# Patient Record
Sex: Male | Born: 1935 | Race: White | Hispanic: No | Marital: Married | Smoking: Never smoker
Health system: Southern US, Community
[De-identification: ages and names within clinical notes are randomized; demographics above are authoritative.]

## PROBLEM LIST (undated history)

## (undated) DIAGNOSIS — I2510 Atherosclerotic heart disease of native coronary artery without angina pectoris: Secondary | ICD-10-CM

## (undated) DIAGNOSIS — D649 Anemia, unspecified: Secondary | ICD-10-CM

## (undated) DIAGNOSIS — M810 Age-related osteoporosis without current pathological fracture: Secondary | ICD-10-CM

## (undated) DIAGNOSIS — Z5189 Encounter for other specified aftercare: Secondary | ICD-10-CM

## (undated) DIAGNOSIS — Z8719 Personal history of other diseases of the digestive system: Secondary | ICD-10-CM

## (undated) DIAGNOSIS — F32A Depression, unspecified: Secondary | ICD-10-CM

## (undated) DIAGNOSIS — M1990 Unspecified osteoarthritis, unspecified site: Secondary | ICD-10-CM

## (undated) DIAGNOSIS — N200 Calculus of kidney: Secondary | ICD-10-CM

## (undated) DIAGNOSIS — R490 Dysphonia: Principal | ICD-10-CM

## (undated) DIAGNOSIS — K439 Ventral hernia without obstruction or gangrene: Secondary | ICD-10-CM

## (undated) DIAGNOSIS — R0602 Shortness of breath: Secondary | ICD-10-CM

## (undated) DIAGNOSIS — M869 Osteomyelitis, unspecified: Secondary | ICD-10-CM

## (undated) DIAGNOSIS — G709 Myoneural disorder, unspecified: Secondary | ICD-10-CM

## (undated) DIAGNOSIS — F05 Delirium due to known physiological condition: Secondary | ICD-10-CM

## (undated) DIAGNOSIS — G20A1 Parkinson's disease without dyskinesia, without mention of fluctuations: Secondary | ICD-10-CM

## (undated) DIAGNOSIS — I4891 Unspecified atrial fibrillation: Secondary | ICD-10-CM

## (undated) DIAGNOSIS — R7611 Nonspecific reaction to tuberculin skin test without active tuberculosis: Secondary | ICD-10-CM

## (undated) DIAGNOSIS — E785 Hyperlipidemia, unspecified: Secondary | ICD-10-CM

## (undated) DIAGNOSIS — R251 Tremor, unspecified: Secondary | ICD-10-CM

## (undated) DIAGNOSIS — F329 Major depressive disorder, single episode, unspecified: Secondary | ICD-10-CM

## (undated) DIAGNOSIS — I872 Venous insufficiency (chronic) (peripheral): Secondary | ICD-10-CM

## (undated) DIAGNOSIS — N183 Chronic kidney disease, stage 3 unspecified: Secondary | ICD-10-CM

## (undated) DIAGNOSIS — K648 Other hemorrhoids: Secondary | ICD-10-CM

## (undated) DIAGNOSIS — I10 Essential (primary) hypertension: Secondary | ICD-10-CM

## (undated) DIAGNOSIS — G20 Parkinson's disease: Secondary | ICD-10-CM

## (undated) DIAGNOSIS — K922 Gastrointestinal hemorrhage, unspecified: Secondary | ICD-10-CM

## (undated) DIAGNOSIS — K219 Gastro-esophageal reflux disease without esophagitis: Secondary | ICD-10-CM

## (undated) DIAGNOSIS — K635 Polyp of colon: Secondary | ICD-10-CM

## (undated) DIAGNOSIS — T8579XA Infection and inflammatory reaction due to other internal prosthetic devices, implants and grafts, initial encounter: Secondary | ICD-10-CM

## (undated) DIAGNOSIS — A159 Respiratory tuberculosis unspecified: Secondary | ICD-10-CM

## (undated) HISTORY — DX: Atherosclerotic heart disease of native coronary artery without angina pectoris: I25.10

## (undated) HISTORY — DX: Other hemorrhoids: K64.8

## (undated) HISTORY — DX: Hyperlipidemia, unspecified: E78.5

## (undated) HISTORY — DX: Essential (primary) hypertension: I10

## (undated) HISTORY — DX: Unspecified osteoarthritis, unspecified site: M19.90

## (undated) HISTORY — PX: EYE MUSCLE SURGERY: SHX370

## (undated) HISTORY — DX: Dysphonia: R49.0

## (undated) HISTORY — DX: Tremor, unspecified: R25.1

## (undated) HISTORY — DX: Polyp of colon: K63.5

## (undated) HISTORY — DX: Parkinson's disease: G20

## (undated) HISTORY — DX: Encounter for other specified aftercare: Z51.89

## (undated) HISTORY — DX: Venous insufficiency (chronic) (peripheral): I87.2

## (undated) HISTORY — PX: ANKLE SURGERY: SHX546

## (undated) HISTORY — PX: SPINE SURGERY: SHX786

## (undated) HISTORY — PX: NOSE SURGERY: SHX723

## (undated) HISTORY — DX: Osteomyelitis, unspecified: M86.9

## (undated) HISTORY — DX: Personal history of other diseases of the digestive system: Z87.19

## (undated) HISTORY — PX: APPENDECTOMY: SHX54

## (undated) HISTORY — DX: Ventral hernia without obstruction or gangrene: K43.9

## (undated) HISTORY — DX: Nonspecific reaction to tuberculin skin test without active tuberculosis: R76.11

## (undated) HISTORY — DX: Chronic kidney disease, stage 3 unspecified: N18.30

## (undated) HISTORY — DX: Major depressive disorder, single episode, unspecified: F32.9

## (undated) HISTORY — PX: CARPAL TUNNEL RELEASE: SHX101

## (undated) HISTORY — PX: FOOT AMPUTATION THROUGH METATARSAL: SHX644

## (undated) HISTORY — DX: Unspecified atrial fibrillation: I48.91

## (undated) HISTORY — DX: Parkinson's disease without dyskinesia, without mention of fluctuations: G20.A1

## (undated) HISTORY — PX: TOE AMPUTATION: SHX809

## (undated) HISTORY — PX: CORONARY ANGIOPLASTY WITH STENT PLACEMENT: SHX49

## (undated) HISTORY — PX: TONSILLECTOMY: SUR1361

## (undated) HISTORY — DX: Chronic kidney disease, stage 3 (moderate): N18.3

## (undated) HISTORY — DX: Gastrointestinal hemorrhage, unspecified: K92.2

## (undated) HISTORY — DX: Age-related osteoporosis without current pathological fracture: M81.0

## (undated) HISTORY — DX: Anemia, unspecified: D64.9

## (undated) HISTORY — PX: CATARACT EXTRACTION: SUR2

## (undated) HISTORY — DX: Depression, unspecified: F32.A

---

## 2000-03-10 HISTORY — PX: REPLACEMENT TOTAL KNEE: SUR1224

## 2000-03-10 HISTORY — PX: DEEP BRAIN STIMULATOR PLACEMENT: SHX608

## 2002-03-10 DIAGNOSIS — K635 Polyp of colon: Secondary | ICD-10-CM

## 2002-03-10 HISTORY — DX: Polyp of colon: K63.5

## 2002-03-22 LAB — HM COLONOSCOPY: HM Colonoscopy: NORMAL

## 2002-05-11 ENCOUNTER — Encounter: Payer: Self-pay | Admitting: Internal Medicine

## 2002-05-11 ENCOUNTER — Ambulatory Visit (HOSPITAL_COMMUNITY): Admission: RE | Admit: 2002-05-11 | Discharge: 2002-05-11 | Payer: Self-pay | Admitting: Internal Medicine

## 2002-06-09 ENCOUNTER — Encounter: Payer: Self-pay | Admitting: Internal Medicine

## 2002-09-15 ENCOUNTER — Encounter: Payer: Self-pay | Admitting: Physical Medicine and Rehabilitation

## 2002-09-15 ENCOUNTER — Encounter
Admission: RE | Admit: 2002-09-15 | Discharge: 2002-09-15 | Payer: Self-pay | Admitting: Physical Medicine and Rehabilitation

## 2003-02-16 ENCOUNTER — Encounter: Admission: RE | Admit: 2003-02-16 | Discharge: 2003-02-16 | Payer: Self-pay | Admitting: Orthopedic Surgery

## 2003-08-10 ENCOUNTER — Inpatient Hospital Stay (HOSPITAL_COMMUNITY): Admission: RE | Admit: 2003-08-10 | Discharge: 2003-08-15 | Payer: Self-pay | Admitting: Specialist

## 2003-11-30 ENCOUNTER — Ambulatory Visit (HOSPITAL_COMMUNITY): Admission: RE | Admit: 2003-11-30 | Discharge: 2003-12-02 | Payer: Self-pay | Admitting: Cardiology

## 2003-12-25 ENCOUNTER — Encounter (HOSPITAL_COMMUNITY): Admission: RE | Admit: 2003-12-25 | Discharge: 2004-03-24 | Payer: Self-pay | Admitting: Cardiology

## 2004-03-10 HISTORY — PX: BACK SURGERY: SHX140

## 2004-03-20 ENCOUNTER — Ambulatory Visit: Payer: Self-pay | Admitting: Cardiology

## 2004-04-02 ENCOUNTER — Ambulatory Visit: Payer: Self-pay | Admitting: Cardiology

## 2004-04-02 ENCOUNTER — Ambulatory Visit: Payer: Self-pay

## 2004-05-23 ENCOUNTER — Ambulatory Visit: Payer: Self-pay | Admitting: Cardiology

## 2004-06-11 ENCOUNTER — Ambulatory Visit: Payer: Self-pay | Admitting: Cardiology

## 2004-06-12 ENCOUNTER — Encounter: Payer: Self-pay | Admitting: Cardiology

## 2004-06-12 ENCOUNTER — Inpatient Hospital Stay (HOSPITAL_COMMUNITY): Admission: EM | Admit: 2004-06-12 | Discharge: 2004-06-14 | Payer: Self-pay | Admitting: Emergency Medicine

## 2004-06-13 ENCOUNTER — Ambulatory Visit: Payer: Self-pay | Admitting: Gastroenterology

## 2004-06-14 ENCOUNTER — Encounter: Payer: Self-pay | Admitting: Gastroenterology

## 2004-08-07 ENCOUNTER — Ambulatory Visit: Payer: Self-pay | Admitting: Cardiology

## 2004-09-30 ENCOUNTER — Ambulatory Visit: Payer: Self-pay | Admitting: Cardiology

## 2004-11-19 ENCOUNTER — Ambulatory Visit: Payer: Self-pay | Admitting: Cardiology

## 2004-11-21 ENCOUNTER — Encounter: Admission: RE | Admit: 2004-11-21 | Discharge: 2004-11-21 | Payer: Self-pay | Admitting: Specialist

## 2005-01-03 ENCOUNTER — Ambulatory Visit: Payer: Self-pay | Admitting: Physical Medicine & Rehabilitation

## 2005-01-03 ENCOUNTER — Inpatient Hospital Stay (HOSPITAL_COMMUNITY): Admission: RE | Admit: 2005-01-03 | Discharge: 2005-01-13 | Payer: Self-pay | Admitting: Specialist

## 2005-01-06 ENCOUNTER — Encounter: Payer: Self-pay | Admitting: Cardiology

## 2005-01-07 ENCOUNTER — Ambulatory Visit: Payer: Self-pay | Admitting: Cardiology

## 2005-01-09 ENCOUNTER — Encounter (INDEPENDENT_AMBULATORY_CARE_PROVIDER_SITE_OTHER): Payer: Self-pay | Admitting: Specialist

## 2005-01-13 ENCOUNTER — Ambulatory Visit: Payer: Self-pay | Admitting: Physical Medicine & Rehabilitation

## 2005-01-13 ENCOUNTER — Inpatient Hospital Stay (HOSPITAL_COMMUNITY)
Admission: RE | Admit: 2005-01-13 | Discharge: 2005-01-25 | Payer: Self-pay | Admitting: Physical Medicine & Rehabilitation

## 2005-02-05 ENCOUNTER — Ambulatory Visit: Payer: Self-pay | Admitting: Cardiology

## 2005-02-20 ENCOUNTER — Ambulatory Visit: Payer: Self-pay

## 2005-03-19 ENCOUNTER — Ambulatory Visit: Payer: Self-pay | Admitting: Cardiology

## 2005-03-26 ENCOUNTER — Ambulatory Visit: Payer: Self-pay | Admitting: Cardiology

## 2005-06-03 ENCOUNTER — Ambulatory Visit: Payer: Self-pay | Admitting: Cardiology

## 2005-06-05 ENCOUNTER — Ambulatory Visit: Payer: Self-pay | Admitting: Cardiology

## 2005-06-10 ENCOUNTER — Ambulatory Visit: Payer: Self-pay | Admitting: Cardiology

## 2005-07-25 ENCOUNTER — Inpatient Hospital Stay (HOSPITAL_COMMUNITY): Admission: RE | Admit: 2005-07-25 | Discharge: 2005-07-25 | Payer: Self-pay | Admitting: Neurosurgery

## 2005-08-08 ENCOUNTER — Ambulatory Visit: Payer: Self-pay | Admitting: Cardiology

## 2005-08-11 ENCOUNTER — Ambulatory Visit: Payer: Self-pay

## 2005-09-27 ENCOUNTER — Inpatient Hospital Stay (HOSPITAL_COMMUNITY): Admission: EM | Admit: 2005-09-27 | Discharge: 2005-10-02 | Payer: Self-pay | Admitting: Emergency Medicine

## 2005-09-27 ENCOUNTER — Encounter (INDEPENDENT_AMBULATORY_CARE_PROVIDER_SITE_OTHER): Payer: Self-pay | Admitting: *Deleted

## 2005-09-30 ENCOUNTER — Encounter: Payer: Self-pay | Admitting: Vascular Surgery

## 2005-10-01 ENCOUNTER — Encounter (INDEPENDENT_AMBULATORY_CARE_PROVIDER_SITE_OTHER): Payer: Self-pay | Admitting: *Deleted

## 2005-10-01 ENCOUNTER — Encounter (INDEPENDENT_AMBULATORY_CARE_PROVIDER_SITE_OTHER): Payer: Self-pay | Admitting: Interventional Radiology

## 2005-11-06 ENCOUNTER — Ambulatory Visit: Payer: Self-pay | Admitting: Cardiology

## 2005-11-07 ENCOUNTER — Encounter: Admission: RE | Admit: 2005-11-07 | Discharge: 2005-11-07 | Payer: Self-pay | Admitting: Specialist

## 2005-11-27 ENCOUNTER — Encounter: Admission: RE | Admit: 2005-11-27 | Discharge: 2005-11-27 | Payer: Self-pay | Admitting: Specialist

## 2005-12-15 ENCOUNTER — Emergency Department (HOSPITAL_COMMUNITY): Admission: EM | Admit: 2005-12-15 | Discharge: 2005-12-15 | Payer: Self-pay | Admitting: Emergency Medicine

## 2006-02-10 ENCOUNTER — Ambulatory Visit: Payer: Self-pay | Admitting: Cardiology

## 2006-02-19 ENCOUNTER — Ambulatory Visit: Payer: Self-pay | Admitting: Cardiology

## 2006-12-03 ENCOUNTER — Ambulatory Visit (HOSPITAL_COMMUNITY): Admission: RE | Admit: 2006-12-03 | Discharge: 2006-12-03 | Payer: Self-pay | Admitting: Specialist

## 2007-01-13 ENCOUNTER — Ambulatory Visit: Payer: Self-pay | Admitting: Cardiology

## 2007-01-15 ENCOUNTER — Ambulatory Visit: Payer: Self-pay | Admitting: Cardiology

## 2007-01-15 LAB — CONVERTED CEMR LAB
AST: 27 units/L (ref 0–37)
Albumin: 4 g/dL (ref 3.5–5.2)
Basophils Absolute: 0.1 10*3/uL (ref 0.0–0.1)
Chloride: 104 meq/L (ref 96–112)
Creatinine, Ser: 1.3 mg/dL (ref 0.4–1.5)
Eosinophils Relative: 5.9 % — ABNORMAL HIGH (ref 0.0–5.0)
Glucose, Bld: 94 mg/dL (ref 70–99)
HCT: 35.9 % — ABNORMAL LOW (ref 39.0–52.0)
LDL Cholesterol: 72 mg/dL (ref 0–99)
Neutrophils Relative %: 63.9 % (ref 43.0–77.0)
RBC: 3.81 M/uL — ABNORMAL LOW (ref 4.22–5.81)
RDW: 12.7 % (ref 11.5–14.6)
Sodium: 138 meq/L (ref 135–145)
Total Bilirubin: 0.9 mg/dL (ref 0.3–1.2)
Total CHOL/HDL Ratio: 4.2
WBC: 6.7 10*3/uL (ref 4.5–10.5)

## 2007-01-19 ENCOUNTER — Ambulatory Visit (HOSPITAL_COMMUNITY): Admission: RE | Admit: 2007-01-19 | Discharge: 2007-01-19 | Payer: Self-pay | Admitting: Specialist

## 2007-01-25 ENCOUNTER — Encounter (INDEPENDENT_AMBULATORY_CARE_PROVIDER_SITE_OTHER): Payer: Self-pay | Admitting: *Deleted

## 2007-01-25 ENCOUNTER — Inpatient Hospital Stay (HOSPITAL_COMMUNITY): Admission: AD | Admit: 2007-01-25 | Discharge: 2007-01-28 | Payer: Self-pay | Admitting: Internal Medicine

## 2007-01-25 ENCOUNTER — Ambulatory Visit: Payer: Self-pay | Admitting: Cardiology

## 2007-01-25 ENCOUNTER — Ambulatory Visit: Payer: Self-pay

## 2007-01-25 ENCOUNTER — Encounter: Payer: Self-pay | Admitting: Cardiology

## 2007-01-25 LAB — CONVERTED CEMR LAB
Basophils Absolute: 0.1 10*3/uL (ref 0.0–0.1)
Eosinophils Absolute: 0.4 10*3/uL (ref 0.0–0.6)
Eosinophils Relative: 5.3 % — ABNORMAL HIGH (ref 0.0–5.0)
MCV: 95.7 fL (ref 78.0–100.0)
Monocytes Relative: 8.8 % (ref 3.0–11.0)
Platelets: 174 10*3/uL (ref 150–400)
RBC: 2.65 M/uL — ABNORMAL LOW (ref 4.22–5.81)
WBC: 8.4 10*3/uL (ref 4.5–10.5)

## 2007-01-27 ENCOUNTER — Encounter: Payer: Self-pay | Admitting: Gastroenterology

## 2007-01-28 ENCOUNTER — Ambulatory Visit: Payer: Self-pay | Admitting: Gastroenterology

## 2007-01-28 ENCOUNTER — Encounter (INDEPENDENT_AMBULATORY_CARE_PROVIDER_SITE_OTHER): Payer: Self-pay | Admitting: *Deleted

## 2007-02-09 ENCOUNTER — Ambulatory Visit: Payer: Self-pay | Admitting: Cardiology

## 2007-02-19 ENCOUNTER — Encounter: Admission: RE | Admit: 2007-02-19 | Discharge: 2007-02-19 | Payer: Self-pay | Admitting: Specialist

## 2007-04-20 ENCOUNTER — Encounter: Admission: RE | Admit: 2007-04-20 | Discharge: 2007-04-20 | Payer: Self-pay | Admitting: Specialist

## 2007-05-24 ENCOUNTER — Ambulatory Visit: Payer: Self-pay | Admitting: Internal Medicine

## 2007-05-24 LAB — CONVERTED CEMR LAB
Basophils Relative: 0.7 % (ref 0.0–1.0)
Eosinophils Relative: 4.6 % (ref 0.0–5.0)
HCT: 36.6 % — ABNORMAL LOW (ref 39.0–52.0)
Hemoglobin: 12 g/dL — ABNORMAL LOW (ref 13.0–17.0)
MCHC: 32.8 g/dL (ref 30.0–36.0)
Monocytes Absolute: 0.9 10*3/uL — ABNORMAL HIGH (ref 0.2–0.7)
Neutrophils Relative %: 70.5 % (ref 43.0–77.0)
RDW: 13.8 % (ref 11.5–14.6)
Saturation Ratios: 8.6 % — ABNORMAL LOW (ref 20.0–50.0)
WBC: 9.7 10*3/uL (ref 4.5–10.5)

## 2007-05-31 ENCOUNTER — Ambulatory Visit: Payer: Self-pay | Admitting: Internal Medicine

## 2007-07-22 ENCOUNTER — Inpatient Hospital Stay (HOSPITAL_COMMUNITY): Admission: AD | Admit: 2007-07-22 | Discharge: 2007-07-23 | Payer: Self-pay | Admitting: Orthopedic Surgery

## 2007-07-22 ENCOUNTER — Encounter (INDEPENDENT_AMBULATORY_CARE_PROVIDER_SITE_OTHER): Payer: Self-pay | Admitting: Orthopedic Surgery

## 2007-09-02 ENCOUNTER — Ambulatory Visit: Payer: Self-pay

## 2007-09-02 ENCOUNTER — Ambulatory Visit: Payer: Self-pay | Admitting: Cardiology

## 2007-09-02 LAB — CONVERTED CEMR LAB
Basophils Relative: 0.2 % (ref 0.0–1.0)
Eosinophils Absolute: 0.4 10*3/uL (ref 0.0–0.7)
Eosinophils Relative: 5.7 % — ABNORMAL HIGH (ref 0.0–5.0)
HCT: 34.9 % — ABNORMAL LOW (ref 39.0–52.0)
Hemoglobin: 12.1 g/dL — ABNORMAL LOW (ref 13.0–17.0)
MCV: 93.5 fL (ref 78.0–100.0)
Monocytes Absolute: 0.7 10*3/uL (ref 0.1–1.0)
Monocytes Relative: 8.7 % (ref 3.0–12.0)
RBC: 3.73 M/uL — ABNORMAL LOW (ref 4.22–5.81)
WBC: 7.7 10*3/uL (ref 4.5–10.5)

## 2007-09-06 ENCOUNTER — Ambulatory Visit: Payer: Self-pay | Admitting: Cardiology

## 2007-09-06 ENCOUNTER — Ambulatory Visit: Payer: Self-pay

## 2007-09-06 LAB — CONVERTED CEMR LAB
CO2: 30 meq/L (ref 19–32)
Calcium: 9.1 mg/dL (ref 8.4–10.5)
Chloride: 102 meq/L (ref 96–112)
Creatinine, Ser: 1.4 mg/dL (ref 0.4–1.5)
Glucose, Bld: 90 mg/dL (ref 70–99)

## 2007-09-18 ENCOUNTER — Inpatient Hospital Stay (HOSPITAL_COMMUNITY): Admission: EM | Admit: 2007-09-18 | Discharge: 2007-09-21 | Payer: Self-pay | Admitting: Emergency Medicine

## 2007-09-19 ENCOUNTER — Encounter (INDEPENDENT_AMBULATORY_CARE_PROVIDER_SITE_OTHER): Payer: Self-pay | Admitting: Orthopaedic Surgery

## 2007-10-05 ENCOUNTER — Encounter: Payer: Self-pay | Admitting: Internal Medicine

## 2007-10-26 ENCOUNTER — Encounter (INDEPENDENT_AMBULATORY_CARE_PROVIDER_SITE_OTHER): Payer: Self-pay | Admitting: Orthopedic Surgery

## 2007-10-26 ENCOUNTER — Ambulatory Visit (HOSPITAL_COMMUNITY): Admission: RE | Admit: 2007-10-26 | Discharge: 2007-10-27 | Payer: Self-pay | Admitting: Orthopedic Surgery

## 2007-11-12 ENCOUNTER — Ambulatory Visit: Payer: Self-pay | Admitting: Cardiology

## 2007-11-12 LAB — CONVERTED CEMR LAB
Basophils Absolute: 0.1 10*3/uL (ref 0.0–0.1)
Basophils Relative: 0.9 % (ref 0.0–3.0)
Eosinophils Absolute: 0.4 10*3/uL (ref 0.0–0.7)
Eosinophils Relative: 5.7 % — ABNORMAL HIGH (ref 0.0–5.0)
HCT: 35.1 % — ABNORMAL LOW (ref 39.0–52.0)
MCHC: 34.2 g/dL (ref 30.0–36.0)
MCV: 91.9 fL (ref 78.0–100.0)
Monocytes Absolute: 0.9 10*3/uL (ref 0.1–1.0)
Neutrophils Relative %: 64.3 % (ref 43.0–77.0)
RBC: 3.82 M/uL — ABNORMAL LOW (ref 4.22–5.81)
WBC: 7.6 10*3/uL (ref 4.5–10.5)

## 2007-12-15 ENCOUNTER — Ambulatory Visit: Payer: Self-pay | Admitting: Cardiology

## 2007-12-15 LAB — CONVERTED CEMR LAB: Saturation Ratios: 22.3 % (ref 20.0–50.0)

## 2008-02-10 ENCOUNTER — Ambulatory Visit: Payer: Self-pay | Admitting: Cardiology

## 2008-02-10 DIAGNOSIS — Z8719 Personal history of other diseases of the digestive system: Secondary | ICD-10-CM

## 2008-02-10 DIAGNOSIS — G20A1 Parkinson's disease without dyskinesia, without mention of fluctuations: Secondary | ICD-10-CM

## 2008-02-10 DIAGNOSIS — E785 Hyperlipidemia, unspecified: Secondary | ICD-10-CM

## 2008-02-10 DIAGNOSIS — G20 Parkinson's disease: Secondary | ICD-10-CM

## 2008-02-10 DIAGNOSIS — I872 Venous insufficiency (chronic) (peripheral): Secondary | ICD-10-CM | POA: Insufficient documentation

## 2008-02-10 DIAGNOSIS — I2510 Atherosclerotic heart disease of native coronary artery without angina pectoris: Secondary | ICD-10-CM | POA: Insufficient documentation

## 2008-02-10 HISTORY — DX: Venous insufficiency (chronic) (peripheral): I87.2

## 2008-02-10 HISTORY — DX: Hyperlipidemia, unspecified: E78.5

## 2008-02-10 HISTORY — DX: Parkinson's disease without dyskinesia, without mention of fluctuations: G20.A1

## 2008-02-10 HISTORY — DX: Parkinson's disease: G20

## 2008-02-10 HISTORY — DX: Personal history of other diseases of the digestive system: Z87.19

## 2008-02-14 ENCOUNTER — Ambulatory Visit: Payer: Self-pay | Admitting: Cardiology

## 2008-02-14 LAB — CONVERTED CEMR LAB
HDL: 38.5 mg/dL — ABNORMAL LOW (ref >39.0)
LDL Cholesterol: 95 mg/dL (ref 0–99)
Total CHOL/HDL Ratio: 4.2
VLDL: 27 mg/dL (ref 0–40)

## 2008-03-31 ENCOUNTER — Encounter: Admission: RE | Admit: 2008-03-31 | Discharge: 2008-03-31 | Payer: Self-pay | Admitting: Family Medicine

## 2008-04-03 ENCOUNTER — Encounter: Payer: Self-pay | Admitting: Internal Medicine

## 2008-07-10 ENCOUNTER — Encounter: Admission: RE | Admit: 2008-07-10 | Discharge: 2008-07-10 | Payer: Self-pay | Admitting: Specialist

## 2008-07-13 ENCOUNTER — Encounter: Admission: RE | Admit: 2008-07-13 | Discharge: 2008-07-13 | Payer: Self-pay | Admitting: Specialist

## 2008-08-01 ENCOUNTER — Encounter: Admission: RE | Admit: 2008-08-01 | Discharge: 2008-08-01 | Payer: Self-pay | Admitting: Specialist

## 2008-10-16 ENCOUNTER — Encounter: Payer: Self-pay | Admitting: Internal Medicine

## 2008-10-20 ENCOUNTER — Encounter: Payer: Self-pay | Admitting: Cardiology

## 2008-12-04 ENCOUNTER — Emergency Department (HOSPITAL_COMMUNITY): Admission: EM | Admit: 2008-12-04 | Discharge: 2008-12-05 | Payer: Self-pay | Admitting: Emergency Medicine

## 2008-12-28 ENCOUNTER — Encounter: Admission: RE | Admit: 2008-12-28 | Discharge: 2009-03-09 | Payer: Self-pay | Admitting: Neurology

## 2009-02-09 ENCOUNTER — Ambulatory Visit: Payer: Self-pay | Admitting: Cardiology

## 2009-02-09 DIAGNOSIS — I10 Essential (primary) hypertension: Secondary | ICD-10-CM

## 2009-02-09 HISTORY — DX: Essential (primary) hypertension: I10

## 2009-02-19 ENCOUNTER — Encounter: Payer: Self-pay | Admitting: Cardiology

## 2009-05-25 ENCOUNTER — Encounter (INDEPENDENT_AMBULATORY_CARE_PROVIDER_SITE_OTHER): Payer: Self-pay | Admitting: *Deleted

## 2009-08-28 ENCOUNTER — Encounter: Admission: RE | Admit: 2009-08-28 | Discharge: 2009-08-28 | Payer: Self-pay | Admitting: Family Medicine

## 2009-09-04 ENCOUNTER — Encounter: Payer: Self-pay | Admitting: Family Medicine

## 2009-09-14 ENCOUNTER — Encounter: Admission: RE | Admit: 2009-09-14 | Discharge: 2009-09-14 | Payer: Self-pay | Admitting: Family Medicine

## 2009-09-20 ENCOUNTER — Encounter (HOSPITAL_COMMUNITY): Admission: RE | Admit: 2009-09-20 | Discharge: 2009-11-29 | Payer: Self-pay | Admitting: Specialist

## 2009-09-21 ENCOUNTER — Encounter: Admission: RE | Admit: 2009-09-21 | Discharge: 2009-09-21 | Payer: Self-pay | Admitting: Specialist

## 2009-09-23 ENCOUNTER — Ambulatory Visit: Payer: Self-pay | Admitting: Vascular Surgery

## 2009-09-23 ENCOUNTER — Inpatient Hospital Stay (HOSPITAL_COMMUNITY): Admission: EM | Admit: 2009-09-23 | Discharge: 2009-09-28 | Payer: Self-pay | Admitting: Emergency Medicine

## 2009-09-23 ENCOUNTER — Encounter (INDEPENDENT_AMBULATORY_CARE_PROVIDER_SITE_OTHER): Payer: Self-pay | Admitting: Internal Medicine

## 2009-09-26 ENCOUNTER — Ambulatory Visit: Payer: Self-pay | Admitting: Physical Medicine & Rehabilitation

## 2009-09-28 ENCOUNTER — Ambulatory Visit: Payer: Self-pay | Admitting: Physical Medicine & Rehabilitation

## 2009-09-28 ENCOUNTER — Inpatient Hospital Stay (HOSPITAL_COMMUNITY)
Admission: RE | Admit: 2009-09-28 | Discharge: 2009-10-15 | Payer: Self-pay | Admitting: Physical Medicine & Rehabilitation

## 2009-10-11 ENCOUNTER — Ambulatory Visit: Payer: Self-pay | Admitting: Internal Medicine

## 2009-11-13 ENCOUNTER — Encounter: Payer: Self-pay | Admitting: Cardiology

## 2009-12-19 ENCOUNTER — Encounter: Payer: Self-pay | Admitting: Cardiology

## 2009-12-24 ENCOUNTER — Encounter: Payer: Self-pay | Admitting: Cardiology

## 2010-02-11 ENCOUNTER — Ambulatory Visit: Payer: Self-pay | Admitting: Cardiology

## 2010-02-11 ENCOUNTER — Encounter: Payer: Self-pay | Admitting: Cardiology

## 2010-02-11 DIAGNOSIS — R0602 Shortness of breath: Secondary | ICD-10-CM | POA: Insufficient documentation

## 2010-02-12 ENCOUNTER — Telehealth: Payer: Self-pay | Admitting: Cardiology

## 2010-02-13 ENCOUNTER — Ambulatory Visit: Payer: Self-pay | Admitting: Cardiology

## 2010-02-13 ENCOUNTER — Ambulatory Visit (HOSPITAL_COMMUNITY)
Admission: RE | Admit: 2010-02-13 | Discharge: 2010-02-13 | Payer: Self-pay | Source: Home / Self Care | Admitting: Cardiology

## 2010-02-13 LAB — CONVERTED CEMR LAB
Calcium: 9.4 mg/dL (ref 8.4–10.5)
GFR calc non Af Amer: 63.4 mL/min (ref >60.00)
Glucose, Bld: 87 mg/dL (ref 70–99)
Potassium: 4.3 meq/L (ref 3.5–5.1)
Sodium: 142 meq/L (ref 135–145)

## 2010-02-18 ENCOUNTER — Telehealth: Payer: Self-pay | Admitting: Cardiology

## 2010-03-08 ENCOUNTER — Ambulatory Visit: Payer: Self-pay | Admitting: Family Medicine

## 2010-03-08 LAB — CONVERTED CEMR LAB
Cholesterol, target level: 200 mg/dL
HDL goal, serum: 40 mg/dL
LDL Goal: 100 mg/dL

## 2010-03-15 ENCOUNTER — Encounter
Admission: RE | Admit: 2010-03-15 | Discharge: 2010-03-15 | Payer: Self-pay | Source: Home / Self Care | Attending: Specialist | Admitting: Specialist

## 2010-03-19 ENCOUNTER — Ambulatory Visit (HOSPITAL_COMMUNITY): Admission: RE | Admit: 2010-03-19 | Payer: Self-pay | Source: Home / Self Care | Admitting: Specialist

## 2010-03-20 ENCOUNTER — Telehealth: Payer: Self-pay | Admitting: Internal Medicine

## 2010-03-27 ENCOUNTER — Other Ambulatory Visit: Payer: Self-pay | Admitting: Dermatology

## 2010-04-09 NOTE — Letter (Signed)
Summary: [facility]   Imported By: Kassie Mends 04/12/2009 08:09:15  _____________________________________________________________________  External Attachment:    Type:   Image     Comment:   External Document

## 2010-04-09 NOTE — Progress Notes (Signed)
Summary: Calling about medication  Medications Added PANTOPRAZOLE SODIUM 40 MG TBEC (PANTOPRAZOLE SODIUM) take one tab by mouth once daily       Phone Note Call from Patient Call back at Home Phone [phone]   Caller: Pernell Dupre Summary of Call: Pt wife calling regarding medication Initial call taken by: Judie Grieve,  February 12, 2010 3:26 PM  Follow-up for Phone Call        I called and spoke with the pt's wife. She states the pt is not on prilosec, but he is taking  pantoprazole 40mg  once daily. I will make this change on his medication list.  Follow-up by: Sherri Rad, RN, BSN,  February 12, 2010 3:37 PM    New/Updated Medications: PANTOPRAZOLE SODIUM 40 MG TBEC (PANTOPRAZOLE SODIUM) take one tab by mouth once daily

## 2010-04-09 NOTE — Assessment & Plan Note (Signed)
Summary: f1y  Medications Added PRILOSEC 20 MG CPDR (OMEPRAZOLE) take one tablet by mouth once daily MIRAPEX 0.5 MG TABS (PRAMIPEXOLE DIHYDROCHLORIDE) take one tablet by mouth six times a day AMANTADINE HCL 100 MG CAPS (AMANTADINE HCL) take one capsule by mouth two times a day METANX 3-35-2 MG TABS (L-METHYLFOLATE-B6-B12) take one tablet by mouth once daily VITAMIN D3 1000 UNIT TABS (CHOLECALCIFEROL) take one tablet by mouth once daily MIRALAX  POWD (POLYETHYLENE GLYCOL 3350) take 17 grams in 8 oz. of liquid once daily LOVAZA 1 GM CAPS (OMEGA-3-ACID ETHYL ESTERS) take one capsule by mouth once daily CITRACAL/VITAMIN D 250-200 MG-UNIT TABS (CALCIUM CITRATE-VITAMIN D) take one tablet by mouth once daily SINEMET 25-250 MG TABS (CARBIDOPA-LEVODOPA) take 1/2 tablet by mouth six times a day MIDODRINE HCL 5 MG TABS (MIDODRINE HCL) take one tablet by mouth two times a day TYLENOL 325 MG TABS (ACETAMINOPHEN) take two tablets by mouth every 4-6 hours as needed ROBAXIN 500 MG TABS (METHOCARBAMOL) take one tablet every 6 hours as needed for muscle spasms SIMVASTATIN 40 MG TABS (SIMVASTATIN) Take one tablet by mouth daily at bedtime      Allergies Added: NKDA  Primary Cheyanne Lamison:  Dr Sandria Manly- Dr Lawanda Cousins  Family Pratice   History of Present Illness: The patient is 77 years old and 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 His wife was with him today. He had a drug of the stent placed to the LAD in 2005 and had an atherectomy procedure same time. He's done well from this since that time with no recent chest pain or palpitations.  His past history is significant for Parkinson's and he has a brain stimulator for this and is followed by Dr. love. He also has had toe amputations for osteomyelitis. He has a history of a GI bleed and had to stop his Plavix for this. He also has hyperlipidemia and degenerative joint disease.  In July of this year he had a lumbar laminectomy for spinal stenosis and was in  rehabilitation and then a skilled nursing facility after that. He has had a great of difficulty with postural hypotension and falls. He has also become deconditioned. He is now home and is doing better. His had no falls since he's been at home.  He has had some symptoms of intermittent shortness of breath not related to exertion. He's had no chest pain or palpitations.    Current Medications (verified): 1)  Prilosec 20 Mg Cpdr (Omeprazole) .... Take One Tablet By Mouth Once Daily 2)  Mirapex 0.5 Mg Tabs (Pramipexole Dihydrochloride) .... Take One Tablet By Mouth Six Times A Day 3)  Amantadine Hcl 100 Mg Caps (Amantadine Hcl) .... Take One Capsule By Mouth Two Times A Day 4)  Paxil 20 Mg Tabs (Paroxetine Hcl) .Marland Kitchen.. 1 Tab Once Daily 5)  Aspirin 81 Mg Tbec (Aspirin) .... Take One Tablet By Mouth Daily 6)  Tramadol Hcl 50 Mg Tabs (Tramadol Hcl) .... One Tab By Mouth Two Times A Day As Needed 7)  Metanx 3-35-2 Mg Tabs (L-Methylfolate-B6-B12) .... Take One Tablet By Mouth Once Daily 8)  Vitamin D3 1000 Unit Tabs (Cholecalciferol) .... Take One Tablet By Mouth Once Daily 9)  Miralax  Powd (Polyethylene Glycol 3350) .... Take 17 Grams in 8 Oz. of Liquid Once Daily 10)  Lovaza 1 Gm Caps (Omega-3-Acid Ethyl Esters) .... Take One Capsule By Mouth Once Daily 11)  Citracal/vitamin D 250-200 Mg-Unit Tabs (Calcium Citrate-Vitamin D) .... Take One Tablet By Mouth Once Daily 12)  Sinemet 25-250 Mg Tabs (Carbidopa-Levodopa) .... Take 1/2 Tablet By Mouth Six Times A Day 13)  Midodrine Hcl 5 Mg Tabs (Midodrine Hcl) .... Take One Tablet By Mouth Two Times A Day 14)  Tylenol 325 Mg Tabs (Acetaminophen) .... Take Two Tablets By Mouth Every 4-6 Hours As Needed 15)  Robaxin 500 Mg Tabs (Methocarbamol) .... Take One Tablet Every 6 Hours As Needed For Muscle Spasms 16)  Simvastatin 40 Mg Tabs (Simvastatin) .... Take One Tablet By Mouth Daily At Bedtime  Allergies (verified): No Known Drug Allergies  Past  History:  Past Medical History: Last updated: 02/10/2008  1. Coronary artery disease status post percutaneous coronary     intervention with a drug-eluting stent to LAD in September 2005. 2. Recurrent gastrointestinal bleeding related to gastric ulcer while     on Plavix. 3. Good LV function. 4. Venous insufficiency in the lower extremities. 5. Status post recent amputation of the right big toe for     osteomyelitis. 6. Hyperlipidemia. 7. 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 8. Parkinson disease with implantable brain stimulator.   Review of Systems       ROS is negative except as outlined in HPI.   Vital Signs:  Patient profile:   77 year old male Height:      68 inches Weight:      211.50 pounds BMI:     32.27 Pulse rate:   65 / minute BP sitting:   140 / 82  (left arm) BP standing:   122 / 80  (left arm) Cuff size:   regular  Vitals Entered By: Stanton Kidney, EMT-P (February 11, 2010 12:53 PM)  Physical Exam  Additional Exam:  Gen. Well-nourished, in no distress   Neck: No JVD, thyroid not enlarged, no carotid bruits Lungs: No tachypnea, clear without rales, rhonchi or wheezes Cardiovascular: Rhythm regular, PMI not displaced,  heart sounds  normal, no murmurs or gallops, trace bilateral peripheral edema, pulses normal in all 4 extremities. Abdomen: BS normal, abdomen soft and non-tender without masses or organomegaly, no hepatosplenomegaly. MS: No deformities, no cyanosis or clubbing   Neuro: tardykinesis and rigidity   Skin:  no lesions    Impression & Recommendations:  Problem # 1:  CAD, NATIVE VESSEL (ICD-414.01)  He had PCI in 2005. He's had no recent chest pain this problem appears stable. His updated medication list for this problem includes:    Aspirin 81 Mg Tbec (Aspirin) .Marland Kitchen... Take one tablet by mouth daily  His updated medication list for this problem includes:    Aspirin 81 Mg Tbec (Aspirin) .Marland Kitchen... Take one tablet by mouth  daily  Orders: EKG w/ Interpretation (93000)  Problem # 2:  SHORTNESS OF BREATH (ICD-786.05) He has had intermittent shortness of breath without chest pain or palpitations. The etiology is not clear. his ECG today shows no acute changes. His exam is normal. He has had long periods of inactivity and I have some concern about the possibility of pulmonary embolism. Will get a d-dimer today. His updated medication list for this problem includes:    Aspirin 81 Mg Tbec (Aspirin) .Marland Kitchen... Take one tablet by mouth daily  Orders: T-D-Dimer Fibrin Derivatives Quantitive [phone])  His updated medication list for this problem includes:    Aspirin 81 Mg Tbec (Aspirin) .Marland Kitchen... Take one tablet by mouth daily  Problem # 3:  PARKINSON'S DISEASE (ICD-332.0) He has a brain stimulator and is followed by Dr. love and Dr. Venetia Maxon. This problem is  his major limitation.  Patient Instructions: 1)  Labwork today: d-dimer (786.05). 2)  Your physician recommends that you continue on your current medications as directed. Please refer to the Current Medication list given to you today. 3)  Your physician wants you to follow-up in:  1 year with Dr. Clifton James.  You will receive a reminder letter in the mail two months in advance. If you don't receive a letter, please call our office to schedule the follow-up appointment.

## 2010-04-09 NOTE — Letter (Signed)
Summary: [facility]  [facility]   Imported By: Marylou Mccoy 01/10/2010 10:54:58  _____________________________________________________________________  External Attachment:    Type:   Image     Comment:   External Document

## 2010-04-09 NOTE — Letter (Signed)
Summary: Colonoscopy Letter  [facility]  [address] [place], [place] [address]   Phone: [phone]  Fax: [phone]      May 25, 2009 MRN: [id number]   [facility] [address] [place], [place]  [address]   Dear Mr. BRODZINSKI,   According to your medical record, it is time for you to schedule a Colonoscopy. The American Cancer Society recommends this procedure as a method to detect early colon cancer. Patients with a family history of colon cancer, or a personal history of colon polyps or inflammatory bowel disease are at increased risk.  This letter has beeen generated based on the recommendations made at the time of your procedure. If you feel that in your particular situation this may no longer apply, please contact our office.  Please call our office at [phone] to schedule this appointment or to update your records at your earliest convenience.  Thank you for cooperating with [place] to provide you with the very best care possible.   Sincerely,  Hedwig Morton. Juanda Chance, M.D.  [facility] [phone]

## 2010-04-09 NOTE — Assessment & Plan Note (Signed)
Summary: O2 sat/hm  Nurse Visit   Vital Signs:  Patient profile:   77 year old male O2 Sat:      98 % on Room air Pulse rate:   67 / minute  Vitals Entered By: Lisabeth Devoid RN (February 13, 2010 11:44 AM)  O2 Flow:  Room air  Allergies: No Known Drug Allergies

## 2010-04-10 ENCOUNTER — Encounter: Payer: Self-pay | Admitting: Specialist

## 2010-04-11 NOTE — Progress Notes (Signed)
Summary: refill  Medications Added PROTONIX 40 MG TBEC (PANTOPRAZOLE SODIUM) Take 1 tablet by mouth two times a day. MUST KEEP OFFICE VISIT FOR FURTHER REFILLS!       Phone Note Call from Patient Call back at Home Phone [phone]   Caller: Patient Call For: Dr Juanda Chance Reason for Call: Talk to Nurse Summary of Call: Patient wants refills for his protonix until his appt day 2-22 Initial call taken by: Tawni Levy,  March 20, 2010 2:31 PM    New/Updated Medications: PROTONIX 40 MG TBEC (PANTOPRAZOLE SODIUM) Take 1 tablet by mouth two times a day. MUST KEEP OFFICE VISIT FOR FURTHER REFILLS! Prescriptions: PROTONIX 40 MG TBEC (PANTOPRAZOLE SODIUM) Take 1 tablet by mouth two times a day. MUST KEEP OFFICE VISIT FOR FURTHER REFILLS!  #60 x 1   Entered by:   Lamona Curl CMA (AAMA)   Authorized by:   Hart Carwin MD   Signed by:   Lamona Curl CMA (AAMA) on 03/20/2010   Method used:   Electronically to        [facility]* (retail)       [address]       [place], [place]  [id number]       Ph: [phone]       Fax: [phone]   RxID:   [id number]

## 2010-04-11 NOTE — Assessment & Plan Note (Signed)
Summary: new pt ov//ccm   Vital Signs:  Patient profile:   77 year old male Weight:      206 pounds Temp:     97.4 degrees F oral BP sitting:   126 / 70  (left arm) Cuff size:   large  Vitals Entered By: Sid Falcon LPN (March 08, 2010 2:20 PM)  History of Present Illness: Here to establish care.  Multiple chronic problems reviewed.  Hx Parkinson's, CAD, hypertension, recurrent falls, venous stasis, hyperlipiemia. Meds reviewed and compliant with all. No recent chest pains. Parkinsons followed closely by neurology with implanted brain stimulator.  [facility] stay and home PT established.  Multiple prior surgeries reviewed.  Hypertension History:      He denies headache, chest pain, palpitations, dyspnea with exertion, visual symptoms, and syncope.        Positive major cardiovascular risk factors include male age 30 years old or older, hyperlipidemia, and hypertension.  Negative major cardiovascular risk factors include no history of diabetes.        Positive history for target organ damage include ASHD (either angina/prior MI/prior CABG).  Further assessment for target organ damage reveals no history of stroke/TIA or peripheral vascular disease.    Lipid Management History:      Positive NCEP/ATP III risk factors include male age 49 years old or older, HDL cholesterol less than 40, hypertension, and ASHD (either angina/prior MI/prior CABG).  Negative NCEP/ATP III risk factors include non-diabetic, no prior stroke/TIA, no peripheral vascular disease, and no history of aortic aneurysm.      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 Married Alcohol use-no 4 children  Past Medical History:  1. Coronary artery disease status post percutaneous coronary     intervention with a drug-eluting stent to  LAD in September 2005. 2. Recurrent gastrointestinal bleeding related to gastric ulcer while     on Plavix. 3. Good LV function. 4. Venous insufficiency in the lower extremities. 5. Status post recent amputation of the right big toe for     osteomyelitis. 6. Hyperlipidemia. 7. 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 8. Parkinson disease with implantable brain stimulator.  9. 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 Consultants:  Dr Lajoyce Corners (ortho), Cardiologist Juanda Chance), neurology (Dr Sandria Manly),                      Dr Venetia Maxon (neurosurgeon).  Past Surgical History: R TKR 2002 Lumbar stenosis 2006 Dr Otelia Sergeant R foot digit amputations. 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 Married Alcohol use-no 4 children  Review of Systems  The patient denies anorexia, fever, weight loss, weight gain, chest pain, syncope, prolonged cough, headaches, hemoptysis, abdominal pain, melena, hematochezia, and severe indigestion/heartburn.    Physical Exam  General:  alert and cooperative. Head:  pt has implanted brain stimulator seen frontal lobes. Ears:  External ear exam shows no significant lesions or deformities.  Otoscopic examination reveals clear canals, tympanic membranes are intact bilaterally without bulging, retraction, inflammation or discharge. Hearing is grossly normal bilaterally. Mouth:  Oral mucosa and oropharynx without lesions or exudates.  Teeth in good repair. Neck:  No deformities, masses, or tenderness noted. Lungs:  Normal respiratory effort, chest expands symmetrically. Lungs are clear to auscultation, no crackles or wheezes. Heart:  normal rate and regular rhythm.  Extremities:  trace leg edema bil.   Neurologic:  alert & oriented X3 and cranial nerves II-XII intact.   Cervical Nodes:  No lymphadenopathy noted Psych:  normally interactive, good eye contact, not anxious appearing, and not  depressed appearing.     Impression & Recommendations:  Problem # 1:  CORONARY ATHEROSCLEROSIS NATIVE CORONARY ARTERY (ICD-414.01)  His updated medication list for this problem includes:    Aspirin 81 Mg Tbec (Aspirin) .Marland Kitchen... Take one tablet by mouth daily  Problem # 2:  HYPERTENSION, BENIGN (ICD-401.1)  Problem # 3:  VENOUS INSUFFICIENCY (ICD-459.81)  Problem # 4:  PARKINSON'S DISEASE (ICD-332.0)  Problem # 5:  HYPERLIPIDEMIA-MIXED (ICD-272.4)  The following medications were removed from the medication list:    Lovaza 1 Gm Caps (Omega-3-acid ethyl esters) .Marland Kitchen... Take one capsule by mouth once daily His updated medication list for this problem includes:    Simvastatin 40 Mg Tabs (Simvastatin) .Marland Kitchen... Take one tablet by mouth daily at bedtime  Complete Medication List: 1)  Mirapex 0.5 Mg Tabs (Pramipexole dihydrochloride) .... Take one tablet by mouth six times a day 2)  Amantadine Hcl 100 Mg Caps (Amantadine hcl) .... Take one capsule by mouth two times a day 3)  Paxil 20 Mg Tabs (Paroxetine hcl) .Marland Kitchen.. 1 tab once daily 4)  Aspirin 81 Mg Tbec (Aspirin) .... Take one tablet by mouth daily 5)  Metanx 3-35-2 Mg Tabs (L-methylfolate-b6-b12) .... Take one tablet by mouth once daily 6)  Miralax Powd (Polyethylene glycol 3350) .... Take 17 grams in 8 oz. of liquid once daily 7)  Citracal/vitamin D 250-200 Mg-unit Tabs (Calcium citrate-vitamin d) .... Take one tablet by mouth once daily 8)  Sinemet 25-250 Mg Tabs (Carbidopa-levodopa) .... Take 1/2 tablet by mouth six times a day 9)  Midodrine Hcl 5 Mg Tabs (Midodrine hcl) .... Take one tablet by mouth two times a day 10)  Simvastatin 40 Mg Tabs (Simvastatin) .... Take one tablet by mouth daily at bedtime 11)  Fentanyl 12 Mcg/hr Pt72 (Fentanyl) 12)  Hydrocodone-acetaminophen 2.5-500 Mg Tabs (Hydrocodone-acetaminophen) .... As needed pain, may take up to 6 per day 13)  Methocarbamol 500 Mg Tabs (Methocarbamol) .... As needed muscle  spasms  Hypertension Assessment/Plan:      The patient's hypertensive risk group is category C: Target organ damage and/or diabetes.  Today's blood pressure is 126/70.    Lipid Assessment/Plan:      Based on NCEP/ATP III, the patient's risk factor category is "history of coronary disease, peripheral vascular disease, cerebrovascular disease, or aortic aneurysm".  The patient's lipid goals are as follows: Total cholesterol goal is 200; LDL cholesterol goal is 100; HDL cholesterol goal is 40; Triglyceride goal is 150.    Patient Instructions: 1)  Please schedule a follow-up appointment in 3 months .  Get AM appt.   Orders Added: 1)  New Patient Level III [99203]    Preventive Care Screening  Colonoscopy:    Date:  03/10/2002    Results:  normal

## 2010-04-11 NOTE — Letter (Signed)
Summary: [facility]  [facility]   Imported By: Marylou Mccoy 03/20/2010 07:57:50  _____________________________________________________________________  External Attachment:    Type:   Image     Comment:   External Document

## 2010-04-11 NOTE — Letter (Signed)
Summary: [facility]   Imported By: Marylou Mccoy 03/20/2010 07:58:32  _____________________________________________________________________  External Attachment:    Type:   Image     Comment:   External Document

## 2010-04-11 NOTE — Letter (Signed)
Summary: [facility] at [facility] 2011  Family Medicine at [facility] 2011   Imported By: Maryln Gottron 03/21/2010 09:43:11  _____________________________________________________________________  External Attachment:    Type:   Image     Comment:   External Document

## 2010-04-11 NOTE — Progress Notes (Signed)
Summary: pt's wife calling for test results    Phone Note Call from Patient   Caller: Spouse [phone] grace Reason for Call: Talk to Nurse, Lab or Test Results Summary of Call: pt's wife calling re test results Initial call taken by: Glynda Jaeger,  February 18, 2010 10:17 AM  Follow-up for Phone Call        [facility]. Sherri Rad, RN, BSN  February 18, 2010 2:21 PM  I spoke with the pt's wife. Follow-up by: Sherri Rad, RN, BSN,  February 18, 2010 4:21 PM

## 2010-04-22 ENCOUNTER — Telehealth: Payer: Self-pay | Admitting: Internal Medicine

## 2010-04-29 DIAGNOSIS — K439 Ventral hernia without obstruction or gangrene: Secondary | ICD-10-CM | POA: Insufficient documentation

## 2010-04-29 DIAGNOSIS — I4891 Unspecified atrial fibrillation: Secondary | ICD-10-CM

## 2010-04-29 DIAGNOSIS — K429 Umbilical hernia without obstruction or gangrene: Secondary | ICD-10-CM | POA: Insufficient documentation

## 2010-04-29 DIAGNOSIS — Z860100 Personal history of colon polyps, unspecified: Secondary | ICD-10-CM | POA: Insufficient documentation

## 2010-04-29 DIAGNOSIS — M1990 Unspecified osteoarthritis, unspecified site: Secondary | ICD-10-CM | POA: Insufficient documentation

## 2010-04-29 DIAGNOSIS — D649 Anemia, unspecified: Secondary | ICD-10-CM | POA: Insufficient documentation

## 2010-04-29 DIAGNOSIS — Z8711 Personal history of peptic ulcer disease: Secondary | ICD-10-CM | POA: Insufficient documentation

## 2010-04-29 HISTORY — DX: Unspecified osteoarthritis, unspecified site: M19.90

## 2010-04-29 HISTORY — DX: Anemia, unspecified: D64.9

## 2010-04-29 HISTORY — DX: Unspecified atrial fibrillation: I48.91

## 2010-04-29 HISTORY — DX: Ventral hernia without obstruction or gangrene: K43.9

## 2010-05-01 ENCOUNTER — Ambulatory Visit (INDEPENDENT_AMBULATORY_CARE_PROVIDER_SITE_OTHER): Payer: Medicare Other | Admitting: Internal Medicine

## 2010-05-01 ENCOUNTER — Encounter: Payer: Self-pay | Admitting: Internal Medicine

## 2010-05-01 DIAGNOSIS — K273 Acute peptic ulcer, site unspecified, without hemorrhage or perforation: Secondary | ICD-10-CM

## 2010-05-01 NOTE — Progress Notes (Signed)
Summary: Schedule Colonoscopy   Phone Note Outgoing Call Call back at Home Phone [phone]   Call placed by: Harlow Mares CMA Duncan Dull),  April 22, 2010 10:39 AM Call placed to: Patient Summary of Call: spoke to pts wife she states that pt has an office visit on 05/01/2010 whic they will keep I also advised her taht pt is over due for his colonoscopy as well and they will discuss that at the office visit.  Initial call taken by: Harlow Mares CMA (AAMA),  April 22, 2010 10:39 AM

## 2010-05-07 NOTE — Discharge Summary (Signed)
Summary: SOB, Tarry Black Stools  NAME:  [name], [name] NO.:  [id number]      MEDICAL RECORD NO.:  [id number]          PATIENT TYPE:  INP      LOCATION:  6740                         FACILITY:  [facility]      PHYSICIAN:  Hillery Aldo, M.D.   DATE OF BIRTH:  Mar 13, 1929      DATE OF ADMISSION:  01/25/2007   DATE OF DISCHARGE:                                 HISTORY & PHYSICAL      PRIMARY CARE PHYSICIAN:  Dr. Andrey Campanile at [facility].      CARDIOLOGIST:  Dr. Juanda Chance.      CHIEF COMPLAINT:  Shortness of breath, tarry black stool.      HISTORY OF PRESENT ILLNESS:  The patient is a 77 year old male with past   medical history of coronary artery disease maintained on Aspirin and   Plavix therapy, as well as a past medical history of acute GI bleed   secondary to gastric ulcer who presented to his cardiologist today for   evaluation of progressive shortness of breath.  During the course of his   visit, he mentioned to his cardiologist that he has been having black   tarry stools for the past 24 hours.  A hemoglobin and hematocrit check   was done and he was found to have approximately 4 gram drop in his   hemoglobin from his normal baseline.  It was recommended that the   patient present for direct admission to the hospital for further   evaluation and workup.  The patient also endorses some transient   lightheadedness yesterday, but no frank syncope.  He is being admitted   for stabilization and GI consultation for presumed recurrent gastric   ulcer with bleed.      PAST MEDICAL HISTORY:   1. Coronary artery disease status post drug eluding stent to the       LAD/atherectomy to the diagonal September, 2005 with some residual       disease medically managed   2. Non-ST elevation MI in 2007 status post back surgery.   3. Status post left open carpal tunnel release November, 2008.   4. Severe Parkinson's disease status post deep brain stimulator.   5.  Chronic lower extremity venous insufficiency with history of       cellulitis.   6. Degenerative disk disease/degenerative joint disease of the spine,       chronic back pain.   7. Hyperlipidemia.   8. Spinal stenosis status post laminectomy in 2005 with fusion of L4-       L5 and L5-S1.   9. History of nephrolithiasis.   10.History of tuberculosis as a child.   11.History of gastric ulcer with bleed in April, 2006.   12.Status post right total knee replacement.   13.Status post cataract extraction 2005.   14.History of anemia.   15.History of diverticulosis.      ALLERGIES:  MORPHINE, SUBLINGUAL NITROGLYCERIN, SEPTRA.      CURRENT MEDICATIONS:   1. Darvocet-N 100 one p.o. q.4h. p.r.n.  2. MiraLax as needed.   3. Paroxetine 20 mg daily.   4. Mirapex 1 mg 1/2 tablet q.3h.   5. Carbidopa 25/250 1/2 tablet q.3h.   6. Plavix 75 mg daily.   7. Simvastatin 40 mg daily.   8. Amantadine 100 mg daily.   9. Aspirin 81 mg daily.      FAMILY HISTORY:  The patient's mother died of a ruptured aortic   abdominal aneurysm.  The patient's father died at 76 from old age.  He   has one brother who has diabetes.  He has two sisters, one was suffers   with Parkinson's disease.      SOCIAL HISTORY:  The patient is married and lives with his wife.  He is   a retired Radio producer.  He is a lifelong nonsmoker.  No alcohol or   drug use.      REVIEW OF SYSTEMS:  The patient has not had any fever or chills.  He   denies any abdominal pain.  No problems with his appetite.  He has had   some decreased energy.  Denies any chest pain.  No cough.  His bowel   movement today was formed.  He has a history of constipation.  There has   been no associated nausea, vomiting or diarrhea.  No dysuria.  No   musculoskeletal complaints except for his usual chronic back pain.      PHYSICAL EXAM:  VITAL SIGNS: Temperature 98.6, blood pressure 152/78,   pulse 63, respirations 18, O2 saturation 100% on room  air.   GENERAL:  Well-developed, well-nourished male in no acute distress.   HEENT: Normocephalic, atraumatic.  The patient does have a deep brain   stimulator visible in his left parietal area.  PERRL.  EOMI.  Oropharynx   clear.   NECK:  Supple, no thyromegaly, no lymphadenopathy, no jugular venous   distention.   CHEST:  Lungs clear to auscultation bilaterally with good air movement.   HEART:  Regular rate, rhythm.  No murmurs, rubs, or gallops.   ABDOMEN:  Soft, nontender, nondistended with normoactive bowel sounds.   RECTAL:  Melanotic stool which is strongly heme positive on guaiac   testing.   EXTREMITIES:  No clubbing, edema, cyanosis.  He does have TED hose on.   NEUROLOGIC:  The patient is alert and oriented x3.  Cranial nerves II-   XII grossly intact.  Nonfocal.   SKIN:  Warm, dry.  No rashes.      Data reviewed.      LABORATORY DATA:  White blood cell count is 8.4, hemoglobin 8.6,   hematocrit 25.3, platelets 174.  Coagulation studies and a basic   metabolic panel are pending at the time of dictation.      ASSESSMENT/PLAN:   1. Acute blood loss anemia secondary to recurrent gastrointestinal       bleed, likely upper:  The patient will be admitted and monitored       closely for hemodynamic stability.  We will start two large-bore       intravenous and put him on normal saline at a rate of 120 mL an       hour.  We will check a hemoglobin and hematocrit q.6h. and given       his coronary disease, transfuse him to maintain a hemoglobin level       of 10 mg/dL.  We will hold his Plavix.  We will start him on  intravenous proton-pump-inhibitor therapy q.12h. intravenous.  We       will obtain a gastrointestinal consultation in the morning for       consideration of an upper endoscopy.   2. Coronary artery disease:  Patient is not having any complaints of       chest pain.  We will monitor him closely for signs of       decompensation.   3. Venous insufficiency:   Continue the patient on TED hose as well PAS       hoses.   4. Hyperlipidemia:  Continue the patient's statin therapy.   5. Prophylaxis:  The patient will be on intravenous Protonix and PAS       hoses for gastrointestinal and deep vein thrombosis prophylaxis.   6. Parkinson's disease:  We will continue the patient's anti-Parkinson       therapy.               Hillery Aldo, M.D.   Electronically Signed            CR/MEDQ  D:  01/25/2007  T:  01/26/2007  Job:  [id number]      cc:   Everardo Beals. Juanda Chance, MD, [facility]   Gloriajean Dell. Andrey Campanile, M.D.

## 2010-05-07 NOTE — Procedures (Signed)
Summary: EGD   EGD  Procedure date:  06/14/2004  Findings:      Location: [facility]   Patient Name: Lance, [name] MRN: [id number] Procedure Procedures: Panendoscopy (EGD) CPT: 43235.    with biopsy(s)/brushing(s). CPT: D1846139.  Personnel: Endoscopist: Venita Lick. Russella Dar, MD, Clementeen Graham.  Exam Location: Exam performed in Endoscopy Suite. Inpatient-ward  Patient Consent: Procedure, Alternatives, Risks and Benefits discussed, consent obtained, from patient.  Indications  Evaluation of: Anemia,  with low iron saturation. Positive fecal occult blood test  History  Current Medications: Patient is not currently taking Coumadin.  Pre-Exam Physical: Performed Jun 14, 2004  Cardio-pulmonary exam, HEENT exam, Abdominal exam, Neurological exam, Mental status exam WNL.  Exam Exam Info: Maximum depth of insertion Duodenum, intended Duodenum. Vocal cords not visualized. Gastric retroflexion performed. ASA Classification: II. Tolerance: excellent.  Sedation Meds: Patient assessed and found to be appropriate for moderate (conscious) sedation. Cetacaine Spray 2 sprays given aerosolized. Versed 3.5 mg. given IV. Fentanyl 25 mcg. given IV.  Monitoring: BP and pulse monitoring done. Oximetry used. Supplemental O2 given  Findings Normal: Proximal Esophagus to Body.  ULCER: in Antrum Maximum size: 5 mm. Not bleeding, clear ulcer base. RUT done, results pending.  ICD9: Ulcer, Gastric, Acute without Hemorrhage: 531.30.  Normal: Pyloric Sphincter to Duodenal 2nd Portion.   Assessment  Diagnoses: 531.30: Ulcer, Gastric, Acute without Hemorrhage.   Events  Unplanned Intervention: No unplanned interventions were required.  Unplanned Events: There were no complications. Plans Medication(s): Await pathology. Continue current medications. PPI: QAM,   Comments: Hold ASA for 2 weeks if possible. He will need to take a PPI long term if ASA is needed long term. Treat for H. pylori if  RUT is positive. Disposition: After procedure patient sent to recovery. After recovery patient sent back to hospital.  Scheduling: Referring provider, to Isidor Holts, MD,  Office Visit, to Dora L. Juanda Chance, MD, prn     This report was created from the original endoscopy report, which was reviewed and signed by the above listed endoscopist.

## 2010-05-07 NOTE — Procedures (Signed)
Summary: EGD   EGD  Procedure date:  01/27/2007  Findings:      Location: [facility]   Patient Name: Lance, [name] MRN: [id number] Procedure Procedures: Panendoscopy (EGD) CPT: 43235.    with biopsy(s)/brushing(s). CPT: D1846139.  Personnel: Endoscopist: Venita Lick. Russella Dar, MD, Clementeen Graham.  Exam Location: Exam performed in Endoscopy Suite. Inpatient-ward  Patient Consent: Procedure, Alternatives, Risks and Benefits discussed, consent obtained, from patient. Consent was obtained by the RN.  Indications Symptoms: Melena.  History  Current Medications: Patient is not currently taking Coumadin.  Pre-Exam Physical: Performed Jan 27, 2007  Cardio-pulmonary exam, HEENT exam WNL. Abdominal exam abnormal. Mental status exam WNL.  Comments: Pt. history reviewed/updated, physical exam performed prior to initiation of sedation?Yes Exam Exam Info: Maximum depth of insertion Duodenum, intended Duodenum. Vocal cords not visualized. Gastric retroflexion performed. Images taken. ASA Classification: II. Tolerance: excellent.  Sedation Meds: Patient assessed and found to be appropriate for moderate (conscious) sedation. Fentanyl 25 mcg. given IV. Versed 2 mg. given IV. Cetacaine Spray 2 sprays given aerosolized.  Monitoring: BP and pulse monitoring done. Oximetry used. Supplemental O2 given  Findings Normal: Proximal Esophagus to Body.  ULCER: in Antrum Not bleeding, clear ulcer base. RUT done, results pending.  ICD9: Ulcer, Gastric, Acute without Hemorrhage: 531.30.  MUCOSAL ABNORMALITY: Antrum to Pyloric Sphincter. Erosions present. Erythematous mucosa. ICD9: Gastritis, Unspecified: 535.50.  Normal: Duodenal Bulb to Duodenal 2nd Portion.   Assessment  Diagnoses: 531.30: Ulcer, Gastric, Acute without Hemorrhage.  535.50: Gastritis, Unspecified.   Events  Unplanned Intervention: No unplanned interventions were required.  Unplanned Events: There were no  complications. Plans Medication(s): Await pathology. PPI: QAM, for indefinitely.   Patient Education: Patient given standard instructions for: Ulcer. Mucosal Abnormality.  Comments: HE NEEDS TO TAKE A PPI QAM FOREVER. HOLD ASA FOR 2 WEEKS IF POSSIBLE. Disposition: After procedure patient sent to recovery. After recovery patient sent home.  Scheduling: EGD, to [employer]. Russella Dar, MD, [facility], F/U GU around Apr 29, 2007.    This report was created from the original endoscopy report, which was reviewed and signed by the above listed endoscopist.    RUT NEGATIVE

## 2010-05-07 NOTE — Procedures (Signed)
Summary: COLON   Colonoscopy  Procedure date:  06/09/2002  Findings:      Location:  [facility].    Procedures Next Due Date:    Colonoscopy: 06/2007 Patient Name: [name], [name] MRN: [id number] Procedure Procedures: Colonoscopy CPT: 57846.    with multiple biopsies. CPT: 9043355007.  Personnel: Endoscopist: Viriginia Amendola L. Juanda Chance, MD.  Referred By: Karlene Einstein, MD.  Exam Location: Exam performed in Outpatient Clinic. Outpatient  Patient Consent: Procedure, Alternatives, Risks and Benefits discussed, consent obtained, from patient. Consent was obtained by the RN.  Indications Symptoms: Abdominal pain / bloating. LARGE ABDOMINAL HERNIA.  History  Pre-Exam Physical: Performed Jun 09, 2002. Cardio-pulmonary exam, Rectal exam, HEENT exam , Abdominal exam, Extremity exam, Neurological exam, Mental status exam WNL.  Exam Exam: Extent of exam reached: Cecum, extent intended: Cecum.  The cecum was identified by appendiceal orifice and IC valve. Colon retroflexion performed. Images taken. ASA Classification: II. Tolerance: good.  Monitoring: Pulse and BP monitoring, Oximetry used. Supplemental O2 given.  Colon Prep Used Golytely for colon prep. Prep results: good.  Sedation Meds: Patient assessed and found to be appropriate for moderate (conscious) sedation. Fentanyl 75 mcg. given IV. Versed 5 mg. given IV.  Findings - MULTIPLE POLYPS: Sigmoid Colon. minimum size 2 mm, maximum size 5 mm. Procedure:  hot biopsy, removed, Polyp retrieved, 7 polyps Polyps sent to pathology. ICD9: Colon Polyps: 211.3.  HEMORRHOIDS: Internal. Size: Grade I. Not bleeding. ICD9: Hemorrhoids, Internal: 455.0.   Assessment Abnormal examination, see findings above.  Diagnoses: 211.3: Colon Polyps.  455.0: Hemorrhoids, Internal.   Comments: multiple diminutive polyps Events  Unplanned Interventions: No intervention was required.  Unplanned Events: There were no  complications. Plans Medication Plan: Await pathology.  Patient Education: Patient given standard instructions for: Yearly hemoccult testing recommended. Patient instructed to get routine colonoscopy every 5 years.  Disposition: After procedure patient sent to recovery. After recovery patient sent home.   This report was created from the original endoscopy report, which was reviewed and signed by the above listed endoscopist.

## 2010-05-07 NOTE — Discharge Summary (Signed)
Summary: GI Bleed  NAME:  [name], [name]                 ACCOUNT NO.:  [id number]      MEDICAL RECORD NO.:  [id number]          PATIENT TYPE:  INP      LOCATION:  6740                         FACILITY:  [facility]      PHYSICIAN:  Hind I Elsaid, MD      DATE OF BIRTH:  Jun 18, 1929      DATE OF ADMISSION:  01/25/2007   DATE OF DISCHARGE:  01/28/2007                                  DISCHARGE SUMMARY      PRIMARY CARE PHYSICIAN:  Dr. Andrey Campanile at [facility]      CARDIOLOGIST:  Dr. Charlies Constable      DISCHARGE DIAGNOSES:   1. Upper gastrointestinal bleeding secondary to gastric ulcer.   2. Acute blood loss anemia secondary to #1.   3. Coronary artery disease status post drug-eluting stent in the left       anterior descending and arthrectomy to the diagonals in 2005.   4. History of non-ST-elevation infarction in 2007 with ejection       fraction of 65% at the last stress Myoview in 2007.   5. Parkinson disease status post brain stimulator.   6. Hyperlipidemia.   7. Peripheral vascular disease.   8. Status post left open carpal tunnel surgery.   9. Chronic lower extremity venous insufficiency.   10.History of degenerative disc disease/degenerative joint disease of       the spine.   11.Status post right total knee replacement.   12.Status post cataract extraction.   13.History of diverticulosis.      DISCHARGE MEDICATIONS:   1. Protonix 40 mg p.o. q.a.m.   2. Paroxetine 20 mg p.o. daily.   3. Mirapex 1 mg 1/2 tab q.3 hours.   4. Carbidopa 25/250 half tab q.3 hours.   5. Simvastatin 40 mg p.o. q.daily.   6. Amantadine 100 mg p.o. daily.      CONSULTATIONS:   1. Gastroenterology consulted for the evaluations of the melena.   2. Dr. Juanda Chance was to follow up the patient during hospitalization.      PROCEDURES:   1. Chest x-ray:  No acute cardiopulmonary disease.   2. EGD which shows ulcer in the antrum, not bleeding, clear ulcer base       with antrum to pyloric sphincter  erosion present.  Biopsy was done       from that site.      HISTORY OF PRESENT ILLNESS:  This is a 77 year old male, very pleasant   gentleman with a history of coronary artery disease on aspirin and   Plavix, history of GI bleeding secondary to gastric ulcer, presented to   his cardiologist, Dr. Charlies Constable, for evaluation of progressive   shortness of breath.  During the course of his visit, he mentioned he   had black tarry stool for the past 24 hours.  At that time, hemoglobin   and hematocrit checked which was found around 8 which is a 4 gm drop in   his hemoglobin from his normal baseline.  At that time,  patient was   recommended to be evaluated at the emergency room.      1. Upper GI bleeding.  Patient was admitted to the hospital and       started on IV fluid and IV Protonix, also patient received total of       three units of packed RBCs during hospitalization.  Aspirin and       Plavix were held secondary to the above problem.  Gastroenterology       consulted, done by Dr. Claudette Head where he recommended EGD; the       report as above shows evidence of ulcer status post biopsy and       recommendations were to continue with Protonix 40 mg p.o. q.a.m.       During hospitalization, hemoglobin remained stable around 11.       Shortness of breath completely resolved.  Further recommendations       by Dr. Russella Dar to hold aspirin.  For that, Dr. Juanda Chance was also asked       to evaluate the patient where he agreed with stopping Plavix and       hold aspirin for two weeks, and need to keep appointment with Dr.       Juanda Chance on December 2.  Patient's symptoms completely resolved and       H&H remained stable.  We felt that the patient is medically stable       to be discharged on Protonix 40 mg p.o. q.a.m.; follow the biopsy       report with Dr. Claudette Head and followup appointment with his       cardiologist, Dr. Juanda Chance, for further evaluation of the aspirin and       the Plavix.     2. Coronary artery disease status post stent and history of non-ST-       elevation MI.  Patient remained stable with no evidence of       shortness of breath or active cardiac event.   3. Parkinson disease.  Patient remained on Sinemet and other       medications.      DISPOSITION:  We feel that the patient is medically stable to be   discharged home and follow with Dr. Claudette Head the result of the   biopsy.  Follow with Dr. Juanda Chance for further evaluation to restart the   aspirin and Plavix.               Hind Bosie Helper, MD   Electronically Signed            HIE/MEDQ  D:  01/28/2007  T:  01/28/2007  Job:  [id number]      cc:   Dr. Timmothy Sours R. Juanda Chance, MD, [facility]

## 2010-05-07 NOTE — Procedures (Signed)
Summary: EGD   EGD  Procedure date:  05/31/2007  Findings:      Location: [facility]   Patient Name: [name], [name] MRN: [id number] Procedure Procedures: Panendoscopy (EGD) CPT: 43235.    with biopsy(s)/brushing(s). CPT: D1846139.  Personnel: Endoscopist: Kiahna Banghart L. Juanda Chance, MD.  Exam Location: Exam performed in Outpatient Clinic. Outpatient  Patient Consent: Procedure, Alternatives, Risks and Benefits discussed, consent obtained, from patient. Consent was obtained by the RN.  Indications  Evaluation of: Anemia,   Surveillance of: Prior gastric ulcer. Last exam: Nov, 2008.  Comments: blood count up to 12.0 gm, stool heme negative, no GI Sx's, Plavix D/C'ed in November 2008, pt had 2 GI bleeds in last 3 years History  Current Medications: Patient is taking a non-steroidal medication. Patient is on an anticoagulant. Patient is not currently taking Coumadin.  Pre-Exam Physical: Performed May 31, 2007  Cardio-pulmonary exam, HEENT exam WNL. Abdominal exam abnormal. Mental status exam WNL.  Comments: Pt. history reviewed/updated, physical exam performed prior to initiation of sedation?yes Exam Exam Info: Maximum depth of insertion Duodenum, intended Duodenum. Vocal cords visualized. Gastric retroflexion performed. Images taken. ASA Classification: III. Tolerance: good.  Sedation Meds: Patient assessed and found to be appropriate for moderate (conscious) sedation. Fentanyl 25 mcg. given IV. Versed 5 mg. given IV. Cetacaine Spray 2 sprays given aerosolized.  Monitoring: BP and pulse monitoring done. Oximetry used. Supplemental O2 given  Findings - Normal: Distal Esophagus.  - Normal: Fundus.  - MUCOSAL ABNORMALITY: Antrum. Erosions present. Erythematous mucosa. Friable mucosa. Subepithelial hemorrhage present. RUT done, results pending. ICD9: Gastritis, Acute: 535.00.  HEALED ULCER: in Antrum Comments: healed antral ulcer.  Normal: Duodenal 2nd Portion.    Assessment Abnormal examination, see findings above.  Diagnoses: 535.00: Gastritis, Acute.   Comments: persistent antral gastritis, high risk for re-bleeding, compete healing of the Gustric ulcer from Nov 2008 Events  Unplanned Intervention: No unplanned interventions were required.  Unplanned Events: There were no complications. Plans Medication(s): Await pathology. PPI: 1 QD, starting May 31, 2007  Iron: 1 QD, starting May 31, 2007   Comments: After speaking with Dr Charlies Constable, we will not resume the Plavix due to the continued risk of rebleeding and hx of 2 prior bleeds, repeat CBC, Iron studies in 2 months Disposition: After procedure patient sent to recovery. After recovery patient sent home.    RUT NEGATIVE

## 2010-05-07 NOTE — Assessment & Plan Note (Signed)
Summary: RX RENEWAL   SCH'D W/WIFE//MEDICARE//A[employer] INS//COPAY AND CX F...   Vital Signs:  Patient profile:   77 year old male Height:      68 inches Weight:      212 pounds BMI:     32.35 Pulse rate:   68 / minute Pulse rhythm:   regular BP sitting:   152 / 82  (left arm)  Vitals Entered By: [name] NCMA (May 01, 2010 9:50 AM)  History of Present Illness Visit Type: Follow-up Visit Primary GI MD: Lance Sar MD Primary Provider: Evelena Peat, MD Chief Complaint: Refill of meds-follow-up History of Present Illness:   This is a 77 year old white male with Parkinson's disease and coronary artery disease who is being seen today for refills of his Protonix. He has a history of a bleeding gastric ulcer in April 2004 and recurrent ulcer on an upper endoscopy in November 2008. He was H. pylori negative. His GI bleeding occurred while on Plavix and aspirin which have since been discontinued. He has been semi-ambulatory due to his Parkinson disease and has had some dysphagia to liquids, likely due to esophageal dysmotility. There is a history of hyperplastic of polyp on colonoscopy in April 2004. He denies any GI symptoms of abdominal pain or tarry stools. He is due for a blood count by his primary care physician [name].   GI Review of Systems      Denies abdominal pain, acid reflux, belching, bloating, chest pain, dysphagia with liquids, dysphagia with solids, heartburn, loss of appetite, nausea, vomiting, vomiting blood, weight loss, and  weight gain.        Denies anal fissure, black tarry stools, change in bowel habit, constipation, diarrhea, diverticulosis, fecal incontinence, heme positive stool, hemorrhoids, irritable bowel syndrome, jaundice, light color stool, liver problems, rectal bleeding, and  rectal pain.  Preventive Screening-Counseling & Management      Drug Use:  no.    Current Medications (verified): 1)  Mirapex 0.5 Mg Tabs (Pramipexole  Dihydrochloride) .... Take One Tablet By Mouth Six Times A Day 2)  Amantadine Hcl 100 Mg Caps (Amantadine Hcl) .... Take One Capsule By Mouth Two Times A Day 3)  Paxil 20 Mg Tabs (Paroxetine Hcl) .Marland Kitchen.. 1 Tab Once Daily 4)  Aspirin 81 Mg Tbec (Aspirin) .... Take One Tablet By Mouth Daily 5)  Metanx 3-35-2 Mg Tabs (L-Methylfolate-B6-B12) .... Take One Tablet By Mouth Once Daily 6)  Miralax  Powd (Polyethylene Glycol 3350) .... Take 17 Grams in 8 Oz. of Liquid Once Daily 7)  Citracal/vitamin D 250-200 Mg-Unit Tabs (Calcium Citrate-Vitamin D) .... Take One Tablet By Mouth Once Daily 8)  Sinemet 25-250 Mg Tabs (Carbidopa-Levodopa) .... Take 1/2 Tablet By Mouth Six Times A Day 9)  Midodrine Hcl 5 Mg Tabs (Midodrine Hcl) .... Take One Tablet By Mouth Two Times A Day 10)  Simvastatin 40 Mg Tabs (Simvastatin) .... Take One Tablet By Mouth Daily At Bedtime 11)  Hydrocodone-Acetaminophen 2.5-500 Mg Tabs (Hydrocodone-Acetaminophen) .... As Needed Pain, May Take Up To 6 Per Day 12)  Methocarbamol 500 Mg Tabs (Methocarbamol) .... As Needed Muscle Spasms 13)  Protonix 40 Mg Tbec (Pantoprazole Sodium) .... Take 1 Tablet By Mouth Two Times A Day.  (Has Been Only Taking 1 By Mouth Once Daily )  Allergies (verified): 1)  ! Morphine  Past History:  Past Medical History: Reviewed history from 03/08/2010 and no changes required.  1. Coronary artery disease status post percutaneous coronary     intervention with  a drug-eluting stent to LAD in September 2005. 2. Recurrent gastrointestinal bleeding related to gastric ulcer while     on Plavix. 3. Good LV function. 4. Venous insufficiency in the lower extremities. 5. Status post recent amputation of the right big toe for     osteomyelitis. 6. Hyperlipidemia. 7. 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 8. Parkinson disease with implantable brain stimulator.  9. 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 Consultants:  Lance Lajoyce Corners (ortho), Cardiologist Juanda Chance), neurology  (Lance Sandria Manly),                      Lance Venetia Maxon (neurosurgeon).  Past Surgical History: Right Knee surgery with subsequent R TKR 2002 Nasal Surgery Left ankle surgery Lumbar stenosis 2006-back surgery x 2 R foot digit amputations. Cataract Extraction Left Open Carpal Tunnel Surgery Cardiac Stent Placement Tonsillectomy Appendectomy Back surgery 09/2009  in rehab until Aug. 8th.  Family History: Reviewed history from 04/29/2010 and no changes required. Sister Parkinson's Disease Family History of Crohn's: Daughter Family History of Heart Disease: Paternal Grandfather Family History of Diabetes: Brother No FH of Colon Cancer:  Social History: Reviewed history from 04/29/2010 and no changes required. Retired Financial risk analyst.  non-smoker. Married Alcohol use-no 4 children Illicit Drug Use - no Drug Use:  no  Review of Systems       Pertinent positive and negative review of systems were noted in the above HPI. All other ROS was otherwise negative.   Physical Exam  General:  chronically ill appearing. Moves with a walker. He is alert and oriented. Eyes:  nonicteric. Neck:  jugular veins not distended. Lungs:  findings of rales in bases. Heart:  normal S1-S2. Abdomen:  soft, nontender abdomen with normal active bowel sounds. 5 cm umbilical hernia, easily reducible. Rectal:  soft Hemoccult negative stool. Extremities:  1+ edema bilaterally. Skin:  stasis dermatitis lower legs Psych:  Alert and cooperative. Normal mood and affect.   Impression & Recommendations:  Problem # 1:  COLONIC POLYPS, HYPERPLASTIC, HX OF (ICD-V12.72) Patient has a history of a hyperplastic polyp of the colon. A recall colonoscopy would be in 2014 but because of his debilitated state and multiple medical issues, I would not recommend a screening colonoscopy. His wife and the patient both agree that the prep for colonoscopy would be to risky for him with only minimal benefits.  Problem # 2:  GASTRIC ULCER,  HX OF (ICD-V12.71) Patient had an  bleeding H.pylori negative  gastric ulcer. His last endoscopy was 4 years ago. Stool is Hemoccult negative. He needs to continue on Protonix 40 mg once a day . We will refill his prescription for that.  Problem # 3:  CORONARY ATHEROSCLEROSIS NATIVE CORONARY ARTERY (ICD-414.01) Assessment: Comment Only  Problem # 4:  PARKINSON'S DISEASE (ICD-332.0) Assessment: Comment Only suspect secondary esophageal dismotility  Patient Instructions: 1)  We will refill Protonix 40 mg. 2)  No further recalls for colonoscopy are planned at this time. 3)  CBC by Lance Lucie Leather office . 4)  Office visit in one year. 5)  Copy sent to : [name] 6)  The medication list was reviewed and reconciled.  All changed / newly prescribed medications were explained.  A complete medication list was provided to the patient / caregiver. Prescriptions: PROTONIX 40 MG TBEC (PANTOPRAZOLE SODIUM) Take 1 tablet by mouth once a day  #30 x 5   Entered by:   Lamona Curl CMA (AAMA)   Authorized by:   Hedwig Morton  Juanda Chance MD   Signed by:   Lamona Curl CMA (AAMA) on 05/01/2010   Method used:   Electronically to        [facility]* (retail)       [address]       [place], [place]  [id number]       Ph: [phone]       Fax: [phone]   RxID:   [id number]

## 2010-05-21 ENCOUNTER — Encounter: Payer: Self-pay | Admitting: Family Medicine

## 2010-05-22 ENCOUNTER — Ambulatory Visit (INDEPENDENT_AMBULATORY_CARE_PROVIDER_SITE_OTHER): Payer: Medicare Other | Admitting: Family Medicine

## 2010-05-22 ENCOUNTER — Encounter: Payer: Self-pay | Admitting: Family Medicine

## 2010-05-22 VITALS — BP 160/88 | Temp 97.8°F | Wt 211.0 lb

## 2010-05-22 DIAGNOSIS — J069 Acute upper respiratory infection, unspecified: Secondary | ICD-10-CM

## 2010-05-22 NOTE — Progress Notes (Signed)
  Subjective:    Patient ID: [name], male    DOB: 10/21/1929, 77 y.o.   MRN: [id number]  HPI  patient has Parkinson's disease. Approximately 10 day history of sinus congestion and cough which is occasionally productive of sputum. No fever or chills. Denies nausea, vomiting, or diarrhea. One day history of mild right ear pain. No vertigo. Patient is a nonsmoker. Cough is not keep him awake and relatively mild. Feels somewhat better today than yesterday.   Review of Systems  Constitutional: Negative for fever, chills, activity change, appetite change, fatigue and unexpected weight change.  HENT: Positive for ear pain and congestion. Negative for sore throat and trouble swallowing.   Respiratory: Positive for cough. Negative for shortness of breath, wheezing and stridor.   Cardiovascular: Negative for chest pain and leg swelling.  Gastrointestinal: Negative for abdominal pain.  Musculoskeletal: Negative for arthralgias.  Skin: Negative for rash.  Neurological: Negative for syncope and headaches.  Hematological: Negative for adenopathy.       Objective:   Physical Exam  patient alert and nontoxic in appearance. Afebrile Eardrums are normal Oropharynx moist and clear Neck supple no adenopathy Chest clear to auscultation Heart regular rhythm and rate       Assessment & Plan:   viral URI. Reassurance. Observe for now. followup  promptly for fever or worsening symptoms

## 2010-05-22 NOTE — Patient Instructions (Signed)
Common Cold, Adult An upper respiratory tract infection, or cold, is a viral infection of the air passages to the lung. Colds are contagious, especially during the first 3 or 4 days. Antibiotics cannot cure a cold. Cold germs are spread by coughs, sneezes, and hand to hand contact. A respiratory tract infection usually clears up in a few days, but some people may be sick for a week or two. HOME CARE INSTRUCTIONS  Only take over-the-counter or prescription medicines for pain, discomfort, or fever as directed by your caregiver.   Be careful not to blow your nose too hard. This may cause a nosebleed.   Use a cool-mist humidifier (vaporizer) to increase air moisture. This will make it easier for you to breath. Do not use hot steam.   Rest as much as possible and get plenty of sleep.   Wash your hands often, especially after you blow your nose. Cover your mouth and nose with a tissue when you sneeze or cough.   Drink at least 8 glasses of clear liquids every day, such as water, fruit juices, tea, clear soups, and carbonated beverages.  SEEK MEDICAL CARE IF:  An oral temperature above 100 lasts 4 days or more, and is not controlled by medication.   You have a sore throat that gets worse or you see white or yellow spots in your throat.   Your cough gets worse or lasts more than 10 days.   You have a rash somewhere on your skin. You have large and tender lumps in your neck.   You have an earache or a headache.   You have thick, greenish or yellowish discharge from your nose.   You cough-up thick yellow, green, gray or bloody mucus (secretions).  SEEK IMMEDIATE MEDICAL CARE IF: You have trouble breathing, chest pain, or your skin or nails look gray or blue. MAKE SURE YOU:   Understand these instructions.   Will watch your condition.   Will get help right away if you are not doing well or get worse.  Document Released: 02/22/2000 Document Re-Released: 02/07/2008 [facility] Patient  Information 2011 [place], [place].

## 2010-05-24 DIAGNOSIS — Z0279 Encounter for issue of other medical certificate: Secondary | ICD-10-CM

## 2010-05-24 LAB — CULTURE, BLOOD (ROUTINE X 2): Culture: NO GROWTH

## 2010-05-24 LAB — URINALYSIS, ROUTINE W REFLEX MICROSCOPIC
Bilirubin Urine: NEGATIVE
Ketones, ur: 15 mg/dL — AB
Nitrite: NEGATIVE
Protein, ur: 30 mg/dL — AB
Specific Gravity, Urine: 1.023 (ref 1.005–1.030)
Urobilinogen, UA: 1 mg/dL (ref 0.0–1.0)

## 2010-05-24 LAB — DIFFERENTIAL
Basophils Absolute: 0 10*3/uL (ref 0.0–0.1)
Eosinophils Relative: 1 % (ref 0–5)
Lymphs Abs: 0.3 10*3/uL — ABNORMAL LOW (ref 0.7–4.0)
Monocytes Absolute: 0.3 10*3/uL (ref 0.1–1.0)
Monocytes Relative: 2 % — ABNORMAL LOW (ref 3–12)

## 2010-05-24 LAB — TSH: TSH: 2.295 u[IU]/mL (ref 0.350–4.500)

## 2010-05-24 LAB — CBC
HCT: 31.1 % — ABNORMAL LOW (ref 39.0–52.0)
HCT: 32.6 % — ABNORMAL LOW (ref 39.0–52.0)
HCT: 34.6 % — ABNORMAL LOW (ref 39.0–52.0)
Hemoglobin: 10.5 g/dL — ABNORMAL LOW (ref 13.0–17.0)
Hemoglobin: 10.7 g/dL — ABNORMAL LOW (ref 13.0–17.0)
Hemoglobin: 11.5 g/dL — ABNORMAL LOW (ref 13.0–17.0)
MCH: 30.7 pg (ref 26.0–34.0)
MCHC: 32.2 g/dL (ref 30.0–36.0)
MCHC: 33.2 g/dL (ref 30.0–36.0)
MCV: 94.4 fL (ref 78.0–100.0)
RBC: 3.74 MIL/uL — ABNORMAL LOW (ref 4.22–5.81)
WBC: 8.4 10*3/uL (ref 4.0–10.5)

## 2010-05-24 LAB — BASIC METABOLIC PANEL
Chloride: 98 mEq/L (ref 96–112)
GFR calc non Af Amer: >60 mL/min (ref >60)
Potassium: 3.5 mEq/L (ref 3.5–5.1)
Sodium: 131 mEq/L — ABNORMAL LOW (ref 135–145)

## 2010-05-24 LAB — URINE CULTURE
Colony Count: >=100000
Culture  Setup Time: 201108022246

## 2010-05-24 LAB — RPR: RPR Ser Ql: NONREACTIVE

## 2010-05-24 LAB — URINE MICROSCOPIC-ADD ON

## 2010-05-25 LAB — CBC
HCT: 33.9 % — ABNORMAL LOW (ref 39.0–52.0)
HCT: 37 % — ABNORMAL LOW (ref 39.0–52.0)
Hemoglobin: 11.4 g/dL — ABNORMAL LOW (ref 13.0–17.0)
Hemoglobin: 12.4 g/dL — ABNORMAL LOW (ref 13.0–17.0)
MCH: 31.7 pg (ref 26.0–34.0)
MCH: 31.7 pg (ref 26.0–34.0)
MCH: 32.1 pg (ref 26.0–34.0)
MCH: 32.2 pg (ref 26.0–34.0)
MCHC: 33.4 g/dL (ref 30.0–36.0)
MCHC: 33.6 g/dL (ref 30.0–36.0)
MCHC: 33.7 g/dL (ref 30.0–36.0)
MCHC: 34.2 g/dL (ref 30.0–36.0)
MCV: 94.4 fL (ref 78.0–100.0)
MCV: 94.8 fL (ref 78.0–100.0)
MCV: 95.2 fL (ref 78.0–100.0)
Platelets: 147 10*3/uL — ABNORMAL LOW (ref 150–400)
Platelets: 147 10*3/uL — ABNORMAL LOW (ref 150–400)
Platelets: 148 K/uL — ABNORMAL LOW (ref 150–400)
Platelets: 205 10*3/uL (ref 150–400)
RBC: 3.56 MIL/uL — ABNORMAL LOW (ref 4.22–5.81)
RBC: 3.67 MIL/uL — ABNORMAL LOW (ref 4.22–5.81)
RBC: 3.92 MIL/uL — ABNORMAL LOW (ref 4.22–5.81)
RDW: 13.5 % (ref 11.5–15.5)
RDW: 13.8 % (ref 11.5–15.5)
RDW: 13.9 % (ref 11.5–15.5)
WBC: 10.5 10*3/uL (ref 4.0–10.5)
WBC: 15.4 K/uL — ABNORMAL HIGH (ref 4.0–10.5)
WBC: 8.2 10*3/uL (ref 4.0–10.5)

## 2010-05-25 LAB — DIFFERENTIAL
Basophils Absolute: 0 10*3/uL (ref 0.0–0.1)
Basophils Absolute: 0 10*3/uL (ref 0.0–0.1)
Basophils Absolute: 0 K/uL (ref 0.0–0.1)
Basophils Relative: 0 % (ref 0–1)
Basophils Relative: 0 % (ref 0–1)
Basophils Relative: 0 % (ref 0–1)
Eosinophils Absolute: 0 10*3/uL (ref 0.0–0.7)
Eosinophils Absolute: 0 10*3/uL (ref 0.0–0.7)
Eosinophils Absolute: 0.6 K/uL (ref 0.0–0.7)
Eosinophils Relative: 5 % (ref 0–5)
Eosinophils Relative: 6 % — ABNORMAL HIGH (ref 0–5)
Lymphocytes Relative: 11 % — ABNORMAL LOW (ref 12–46)
Lymphs Abs: 0.7 10*3/uL (ref 0.7–4.0)
Lymphs Abs: 1.1 K/uL (ref 0.7–4.0)
Monocytes Absolute: 0.9 10*3/uL (ref 0.1–1.0)
Monocytes Absolute: 1.2 K/uL — ABNORMAL HIGH (ref 0.1–1.0)
Monocytes Relative: 11 % (ref 3–12)
Monocytes Relative: 2 % — ABNORMAL LOW (ref 3–12)
Monocytes Relative: 7 % (ref 3–12)
Neutro Abs: 13.8 10*3/uL — ABNORMAL HIGH (ref 1.7–7.7)
Neutro Abs: 7.7 K/uL (ref 1.7–7.7)
Neutrophils Relative %: 73 % (ref 43–77)
Neutrophils Relative %: 89 % — ABNORMAL HIGH (ref 43–77)
Neutrophils Relative %: 94 % — ABNORMAL HIGH (ref 43–77)

## 2010-05-25 LAB — GLUCOSE, CAPILLARY
Glucose-Capillary: 101 mg/dL — ABNORMAL HIGH (ref 70–99)
Glucose-Capillary: 102 mg/dL — ABNORMAL HIGH (ref 70–99)
Glucose-Capillary: 106 mg/dL — ABNORMAL HIGH (ref 70–99)
Glucose-Capillary: 108 mg/dL — ABNORMAL HIGH (ref 70–99)
Glucose-Capillary: 109 mg/dL — ABNORMAL HIGH (ref 70–99)
Glucose-Capillary: 114 mg/dL — ABNORMAL HIGH (ref 70–99)
Glucose-Capillary: 116 mg/dL — ABNORMAL HIGH (ref 70–99)
Glucose-Capillary: 121 mg/dL — ABNORMAL HIGH (ref 70–99)
Glucose-Capillary: 129 mg/dL — ABNORMAL HIGH (ref 70–99)
Glucose-Capillary: 130 mg/dL — ABNORMAL HIGH (ref 70–99)
Glucose-Capillary: 133 mg/dL — ABNORMAL HIGH (ref 70–99)
Glucose-Capillary: 144 mg/dL — ABNORMAL HIGH (ref 70–99)
Glucose-Capillary: 151 mg/dL — ABNORMAL HIGH (ref 70–99)
Glucose-Capillary: 182 mg/dL — ABNORMAL HIGH (ref 70–99)
Glucose-Capillary: 95 mg/dL (ref 70–99)
Glucose-Capillary: 98 mg/dL (ref 70–99)

## 2010-05-25 LAB — BASIC METABOLIC PANEL WITH GFR
BUN: 22 mg/dL (ref 6–23)
CO2: 23 meq/L (ref 19–32)
Calcium: 8.1 mg/dL — ABNORMAL LOW (ref 8.4–10.5)
Chloride: 105 meq/L (ref 96–112)
Creatinine, Ser: 1.3 mg/dL (ref 0.4–1.5)
GFR calc non Af Amer: 54 mL/min — ABNORMAL LOW
Glucose, Bld: 101 mg/dL — ABNORMAL HIGH (ref 70–99)
Potassium: 3.5 meq/L (ref 3.5–5.1)
Sodium: 139 meq/L (ref 135–145)

## 2010-05-25 LAB — CULTURE, BLOOD (ROUTINE X 2)

## 2010-05-25 LAB — TROPONIN I: Troponin I: 0.02 ng/mL (ref 0.00–0.06)

## 2010-05-25 LAB — COMPREHENSIVE METABOLIC PANEL WITH GFR
ALT: 10 U/L (ref 0–53)
AST: 30 U/L (ref 0–37)
Albumin: 3.2 g/dL — ABNORMAL LOW (ref 3.5–5.2)
Alkaline Phosphatase: 65 U/L (ref 39–117)
BUN: 15 mg/dL (ref 6–23)
CO2: 26 meq/L (ref 19–32)
Calcium: 8.8 mg/dL (ref 8.4–10.5)
Chloride: 98 meq/L (ref 96–112)
Creatinine, Ser: 1.13 mg/dL (ref 0.4–1.5)
GFR calc non Af Amer: >60 mL/min
Glucose, Bld: 91 mg/dL (ref 70–99)
Potassium: 4.2 meq/L (ref 3.5–5.1)
Sodium: 132 meq/L — ABNORMAL LOW (ref 135–145)
Total Bilirubin: 0.8 mg/dL (ref 0.3–1.2)
Total Protein: 6.8 g/dL (ref 6.0–8.3)

## 2010-05-25 LAB — URINALYSIS, MICROSCOPIC ONLY
Bilirubin Urine: NEGATIVE
Glucose, UA: NEGATIVE mg/dL
Ketones, ur: NEGATIVE mg/dL
Leukocytes, UA: NEGATIVE
Nitrite: NEGATIVE
Protein, ur: NEGATIVE mg/dL
Specific Gravity, Urine: 1.01 (ref 1.005–1.030)
Urobilinogen, UA: 1 mg/dL (ref 0.0–1.0)
pH: 7.5 (ref 5.0–8.0)

## 2010-05-25 LAB — BASIC METABOLIC PANEL
BUN: 15 mg/dL (ref 6–23)
BUN: 19 mg/dL (ref 6–23)
CO2: 24 mEq/L (ref 19–32)
CO2: 24 mEq/L (ref 19–32)
CO2: 26 mEq/L (ref 19–32)
Calcium: 9 mg/dL (ref 8.4–10.5)
Calcium: 9.2 mg/dL (ref 8.4–10.5)
Calcium: 9.4 mg/dL (ref 8.4–10.5)
Chloride: 102 mEq/L (ref 96–112)
Chloride: 106 mEq/L (ref 96–112)
Creatinine, Ser: 1.11 mg/dL (ref 0.4–1.5)
Creatinine, Ser: 1.12 mg/dL (ref 0.4–1.5)
GFR calc Af Amer: >60 mL/min (ref >60)
GFR calc Af Amer: >60 mL/min (ref >60)
GFR calc non Af Amer: >60 mL/min (ref >60)
GFR calc non Af Amer: >60 mL/min (ref >60)
Glucose, Bld: 161 mg/dL — ABNORMAL HIGH (ref 70–99)
Glucose, Bld: 83 mg/dL (ref 70–99)
Potassium: 4 mEq/L (ref 3.5–5.1)
Sodium: 136 mEq/L (ref 135–145)
Sodium: 143 mEq/L (ref 135–145)

## 2010-05-25 LAB — URINALYSIS, ROUTINE W REFLEX MICROSCOPIC
Bilirubin Urine: NEGATIVE
Glucose, UA: NEGATIVE mg/dL
Specific Gravity, Urine: 1.025 (ref 1.005–1.030)
Urobilinogen, UA: 1 mg/dL (ref 0.0–1.0)

## 2010-05-25 LAB — RETICULOCYTES
RBC.: 3.7 MIL/uL — ABNORMAL LOW (ref 4.22–5.81)
Retic Count, Absolute: 33.3 K/uL (ref 19.0–186.0)
Retic Ct Pct: 0.9 % (ref 0.4–3.1)

## 2010-05-25 LAB — VITAMIN B12: Vitamin B-12: >2000 pg/mL — ABNORMAL HIGH (ref 211–911)

## 2010-05-25 LAB — MAGNESIUM
Magnesium: 2 mg/dL (ref 1.5–2.5)
Magnesium: 2.2 mg/dL (ref 1.5–2.5)

## 2010-05-25 LAB — COMPREHENSIVE METABOLIC PANEL
ALT: 11 U/L (ref 0–53)
AST: 32 U/L (ref 0–37)
Albumin: 3.8 g/dL (ref 3.5–5.2)
Alkaline Phosphatase: 71 U/L (ref 39–117)
Chloride: 105 mEq/L (ref 96–112)
GFR calc Af Amer: >60 mL/min (ref >60)
Potassium: 4.4 mEq/L (ref 3.5–5.1)
Total Bilirubin: 0.7 mg/dL (ref 0.3–1.2)

## 2010-05-25 LAB — CARDIAC PANEL(CRET KIN+CKTOT+MB+TROPI)
CK, MB: 2.6 ng/mL (ref 0.3–4.0)
CK, MB: 2.8 ng/mL (ref 0.3–4.0)
Relative Index: INVALID (ref 0.0–2.5)
Relative Index: INVALID (ref 0.0–2.5)
Total CK: 87 U/L (ref 7–232)
Total CK: 89 U/L (ref 7–232)

## 2010-05-25 LAB — PHOSPHORUS
Phosphorus: 3.7 mg/dL (ref 2.3–4.6)
Phosphorus: 3.8 mg/dL (ref 2.3–4.6)

## 2010-05-25 LAB — URINE CULTURE
Colony Count: >=100000
Culture  Setup Time: 201107312126
Culture: NO GROWTH

## 2010-05-25 LAB — CK: Total CK: 381 U/L — ABNORMAL HIGH (ref 7–232)

## 2010-05-25 LAB — POCT CARDIAC MARKERS
Myoglobin, poc: 249 ng/mL (ref 12–200)
Troponin i, poc: <0.05 ng/mL (ref 0.00–0.09)

## 2010-05-25 LAB — HEMOGLOBIN AND HEMATOCRIT, BLOOD
HCT: 29.9 % — ABNORMAL LOW (ref 39.0–52.0)
Hemoglobin: 9.9 g/dL — ABNORMAL LOW (ref 13.0–17.0)

## 2010-05-25 LAB — POCT I-STAT 4, (NA,K, GLUC, HGB,HCT)
Glucose, Bld: 118 mg/dL — ABNORMAL HIGH (ref 70–99)
HCT: 32 % — ABNORMAL LOW (ref 39.0–52.0)
Hemoglobin: 10.9 g/dL — ABNORMAL LOW (ref 13.0–17.0)
Potassium: 3.9 meq/L (ref 3.5–5.1)
Sodium: 142 meq/L (ref 135–145)

## 2010-05-25 LAB — MRSA PCR SCREENING: MRSA by PCR: NEGATIVE

## 2010-05-25 LAB — IRON AND TIBC
Iron: 91 ug/dL (ref 42–135)
UIBC: 192 ug/dL

## 2010-05-25 LAB — C-REACTIVE PROTEIN: CRP: 0.6 mg/dL — ABNORMAL HIGH

## 2010-05-25 LAB — PROTIME-INR
INR: 1.01 (ref 0.00–1.49)
INR: 1.02 (ref 0.00–1.49)
Prothrombin Time: 13.2 s (ref 11.6–15.2)
Prothrombin Time: 13.3 s (ref 11.6–15.2)

## 2010-05-25 LAB — URINE MICROSCOPIC-ADD ON

## 2010-05-25 LAB — CK TOTAL AND CKMB (NOT AT ARMC): CK, MB: 2.6 ng/mL (ref 0.3–4.0)

## 2010-05-29 ENCOUNTER — Ambulatory Visit (INDEPENDENT_AMBULATORY_CARE_PROVIDER_SITE_OTHER): Payer: Medicare Other | Admitting: Family Medicine

## 2010-05-29 ENCOUNTER — Encounter: Payer: Self-pay | Admitting: Family Medicine

## 2010-05-29 VITALS — BP 120/70 | Temp 98.3°F

## 2010-05-29 DIAGNOSIS — J019 Acute sinusitis, unspecified: Secondary | ICD-10-CM

## 2010-05-29 MED ORDER — AMOXICILLIN-POT CLAVULANATE 875-125 MG PO TABS: 1.0000 | ORAL_TABLET | Freq: Two times a day (BID) | ORAL | Status: AC

## 2010-05-29 NOTE — Patient Instructions (Signed)

## 2010-05-29 NOTE — Progress Notes (Signed)
  Subjective:    Patient ID: [name], male    DOB: [birth year], 77 y.o.   MRN: [id number]  HPI  patient seen last week with probable viral URI. Now presents with right maxillary facial pain and progressive yellowish nasal discharge. No headaches or fever. No earaches. Appetite is fair. Questionable history of remote sinus surgery many years ago in childhood. Patient has no known antibiotic allergies.  Cough is better since last week.   Review of Systems  Constitutional: Positive for fatigue. Negative for fever and chills.  HENT: Positive for congestion and postnasal drip. Negative for ear pain.   Respiratory: Positive for cough. Negative for shortness of breath.   Cardiovascular: Negative for chest pain.       Objective:   Physical Exam  the patient is alert and nontoxic in appearance.  Nasal table speculum mucus right naris. Left is clear Eardrums are normal Neck is supple no adenopathy Chest  clear       Assessment & Plan:   probable acute sinusitis following recent viral URI. Start Augmentin 875 mg twice a day for 10 days

## 2010-06-07 ENCOUNTER — Encounter: Payer: Self-pay | Admitting: Family Medicine

## 2010-06-07 ENCOUNTER — Ambulatory Visit (INDEPENDENT_AMBULATORY_CARE_PROVIDER_SITE_OTHER): Payer: Medicare Other | Admitting: Family Medicine

## 2010-06-07 DIAGNOSIS — J329 Chronic sinusitis, unspecified: Secondary | ICD-10-CM

## 2010-06-07 DIAGNOSIS — E785 Hyperlipidemia, unspecified: Secondary | ICD-10-CM

## 2010-06-07 DIAGNOSIS — R531 Weakness: Secondary | ICD-10-CM

## 2010-06-07 DIAGNOSIS — R5383 Other fatigue: Secondary | ICD-10-CM

## 2010-06-07 LAB — HEPATIC FUNCTION PANEL
ALT: 9 U/L (ref 0–53)
AST: 28 U/L (ref 0–37)
Albumin: 3.9 g/dL (ref 3.5–5.2)
Alkaline Phosphatase: 91 U/L (ref 39–117)
Total Protein: 7.7 g/dL (ref 6.0–8.3)

## 2010-06-07 LAB — LIPID PANEL
Cholesterol: 117 mg/dL (ref 0–200)
VLDL: 16.2 mg/dL (ref 0.0–40.0)

## 2010-06-07 NOTE — Progress Notes (Signed)
  Subjective:    Patient ID: [name], male    DOB: May 18, 1929, 77 y.o.   MRN: [id number]  HPI Patient seen for medical followup. Chronic problems include hyperlipidemia, Parkinson's disease, hypertension, CAD with remote history of stent, past atrial fibrillation. Recent acute sinusitis improved on Augmentin. No side effects to medication. Facial pain and headache improved. No fever. No post nasal drip symptoms.  Hyperlipidemia treated with simvastatin 40 mg daily. No myalgias. Last lipids unknown.   Review of Systems  Constitutional: Negative for fever and chills.  Respiratory: Negative for cough and shortness of breath.   Cardiovascular: Negative for chest pain and leg swelling.  Gastrointestinal: Negative for abdominal pain.  Genitourinary: Negative for dysuria.       Objective:   Physical Exam  Constitutional: He appears well-developed and well-nourished.  Neck: Neck supple.  Cardiovascular: Normal rate and regular rhythm.  Exam reveals no gallop.   Pulmonary/Chest: Effort normal and breath sounds normal. No respiratory distress. He has no wheezes. He has no rales.  Musculoskeletal: He exhibits no edema.  Lymphadenopathy:    He has no cervical adenopathy.          Assessment & Plan:  #1 acute sinusitis improved. Finish out antibiotic #2 hyperlipidemia in patient with history of CAD. Check lipid panel and hepatic panel. Routine followup 6 months

## 2010-06-08 ENCOUNTER — Encounter: Payer: Self-pay | Admitting: Family Medicine

## 2010-06-08 LAB — VITAMIN D 25 HYDROXY (VIT D DEFICIENCY, FRACTURES): Vit D, 25-Hydroxy: 32 ng/mL (ref 30–89)

## 2010-06-11 NOTE — Progress Notes (Signed)
Quick Note:  Pt informed ______ 

## 2010-06-13 ENCOUNTER — Telehealth: Payer: Self-pay | Admitting: *Deleted

## 2010-06-13 NOTE — Telephone Encounter (Signed)
Would like lab results on pt.  Wife received hers but none on pt.

## 2010-06-13 NOTE — Telephone Encounter (Signed)
Pt wife informed I had called earlier with lab results.

## 2010-07-11 ENCOUNTER — Encounter: Payer: Self-pay | Admitting: Family Medicine

## 2010-07-11 ENCOUNTER — Ambulatory Visit (INDEPENDENT_AMBULATORY_CARE_PROVIDER_SITE_OTHER): Payer: Medicare Other | Admitting: Family Medicine

## 2010-07-11 VITALS — BP 124/70 | Temp 97.8°F | Wt 213.0 lb

## 2010-07-11 DIAGNOSIS — R143 Flatulence: Secondary | ICD-10-CM

## 2010-07-11 NOTE — Patient Instructions (Signed)
Gas   (Flatulence)     Burping releases air that you swallowed. The bubbles from some drinks may cause burps. There are good bacteria in your gut to help you digest food. Gas is produced by these bacteria and released from your bottom.  Most people release 3 to 4 quarts of gas every day. This is normal.     HOME CARE      Eat or drink less of the foods or liquids that give you gas.   Foods that give you gas may be added to your diet a little at a time.   Take the time to chew your food well.  Talk less while you eat.   Do not suck on ice or hard candy.   Sip slowly. Stir some of the bubbles out of fizzy drinks with a spoon or straw.   Chewing gum or smoking may cause you to swallow more air.   Ask about liquids and tablets that may help control burping and gas.   Only take medicine as directed by your doctor.     GET HELP RIGHT AWAY IF:      There is discomfort when you burp or pass gas.    Your throw up (vomit) comes up when you burp.   Poop (stool) comes out when you pass gas.   Your belly is swollen and hard.     MAKE SURE YOU:       Understand these instructions.    Will watch your condition.   Will get help right away if you are not doing well or get worse.     Document Released: 12/28/2007  Document Re-Released: 12/22/2008  [employer] Patient Information 2011 [employer].

## 2010-07-11 NOTE — Progress Notes (Signed)
  Subjective:    Patient ID: [name], male    DOB: 07-26-1929, 77 y.o.   MRN: [id number]  HPI Patient seen with increased gas off and on past couple months. No real associated pains. No diarrhea. Occasional constipation. Very limited physical activity. No history of lactose deficiency. Appetite is okay. No recent weight changes. No alleviating factors. Exacerbated by certain foods.  Patient history Parkinson's disease as well as coronary artery disease, atrial fibrillation, history of gastric ulcer, and history of hyperplastic colon polyps. He had bleeding gastric ulcer 2004 with recurrent ulcer November 2008. Denies any midepigastric pain. Last colonoscopy 2004. Has decided against any further studies with his debilitated state and advanced Parkinson's disease.  Past Medical History  Diagnosis Date  . HYPERLIPIDEMIA-MIXED 02/10/2008  . PARKINSON'S DISEASE 02/10/2008  . HYPERTENSION, BENIGN 02/09/2009  . Coronary atherosclerosis of native coronary artery 02/10/2008  . VENOUS INSUFFICIENCY 02/10/2008  . GASTROINTESTINAL HEMORRHAGE, HX OF 02/10/2008  . ANEMIA 04/29/2010  . FIBRILLATION, ATRIAL 04/29/2010  . VENTRAL HERNIA 04/29/2010  . DEGENERATIVE JOINT DISEASE 04/29/2010   Past Surgical History  Procedure Date  . Knee surgery 2002    TKR Right 2002  . Nose surgery   . Ankle surgery     left  . Back surgery 2006    lumbar stenosis X 2  . Toe amputation     right foot  . Cataract extraction   . Carpal tunnel release     left, open  . Carotid stent   . Tonsillectomy   . Appendectomy     reports that he has never smoked. He does not have any smokeless tobacco history on file. He reports that he does not drink alcohol. His drug history not on file. family history includes Crohn's disease in his daughter; Diabetes in his brother; Heart disease in his paternal grandfather; and Parkinsonism in his sister. Allergies  Allergen Reactions  . Morphine       Review of Systems    Constitutional: Negative for fever, chills, activity change and unexpected weight change.  HENT: Negative for trouble swallowing.   Respiratory: Negative for shortness of breath.   Cardiovascular: Negative for chest pain, palpitations and leg swelling.  Gastrointestinal: Positive for constipation. Negative for diarrhea, blood in stool, abdominal distention and rectal pain.  Genitourinary: Negative for dysuria.       Objective:   Physical Exam  Constitutional: He appears well-developed and well-nourished.  Cardiovascular: Normal rate, regular rhythm and normal heart sounds.   Pulmonary/Chest: Effort normal and breath sounds normal. No respiratory distress. He has no wheezes. He has no rales.  Abdominal: Soft. Bowel sounds are normal. He exhibits no distension. There is no tenderness. There is no rebound and no guarding.       Large umbilical hernia which is soft and nontender and easily reducible  Psychiatric: He has a normal mood and affect.          Assessment & Plan:  Increased flatulence. Discussed dietary issues. Educational handout given. No change of medications recommended at this time,  discussed measures to reduce constipation.

## 2010-07-19 ENCOUNTER — Telehealth: Payer: Self-pay | Admitting: Family Medicine

## 2010-07-19 NOTE — Telephone Encounter (Signed)
Opened in error

## 2010-07-23 NOTE — Assessment & Plan Note (Signed)
[facility]                            CARDIOLOGY OFFICE NOTE   TINY, RIETZ                        MRN:          [id number]  DATE:09/02/2007                            DOB:          25-Aug-1929    PRIMARY CARE PHYSICIAN:  Dr. Ursula Beath with [facility].   CLINICAL HISTORY:  [name] is a 77 year old returns for management of  coronary heart disease.  He had a directional atherectomy of the  diagonal branch and stenting of the LAD with a Taxus stent in 2005.  He  has done well from that standpoint but he has been on Plavix and he has  had recurrent GI bleeds and the Plavix was finally discontinued.  He is  now taking a baby aspirin.   He has had no recent trouble with chest pain or palpitations.  He does  get short of breath with minimal exertion.   He did have his right big toe removed by Dr. Lajoyce Corners about 6 weeks ago  because of osteomyelitis in the toe.  He also has been bothered by edema  of the both extremities, greater in the right.   PAST MEDICAL HISTORY:  Significant for venous insufficiency of the lower  extremities.  He also has Parkinson disease with a brain stimulator.  He  has degenerative disease of knees and spine, and has hyperlipidemia.  He  also carries a diagnosis of peripheral vascular disease but he does not  have a clear documentation of this.   He had endoscopy done by Dr. Lina Sar, which showed gastritis and  persistent antral gastritis putting him at high risk for rebleeding in  March of this year.   CURRENT MEDICATIONS:  1. Paroxetine.  2. Mirapex.  3. Carbidopa/levo.  4. Aspirin 81 mg.  5. Simvastatin 40 mg.  6. Comtan for Parkinson.  7. Amantadine.  8. Pantoprazole.  9. Iron.   PHYSICAL EXAMINATION:  VITAL SIGNS:  Blood pressure is 115/63 and pulse  61 and regular.  NECK:  There was no venous distention.  The carotid pulses were full  without bruits.  CHEST:  Clear without rales or  rhonchi.  CARDIAC:  The cardiac rhythm was regular.  There is 2/6 systolic murmur  in the left sternal edge.  ABDOMEN:  Soft with normal bowel sounds.  There is no  hepatosplenomegaly.  EXTREMITIES:  There was 2+ edema in the right lower extremity with  stasis changes.  There was recent surgical amputation of the right big  toe.  There was 1+ edema in left lower extremity with stasis changes.   IMPRESSION:  1. Coronary artery disease status post percutaneous coronary      intervention with a drug-eluting stent to LAD in September 2005.  2. Recurrent gastrointestinal bleeding related to gastric ulcer while      on Plavix.  3. Good LV function.  4. Venous insufficiency in the lower extremities.  5. Status post recent amputation of the right big toe for      osteomyelitis.  6. Hyperlipidemia.  7. Degenerative disease of the lumbar spine  and knees.  8. Parkinson disease with implantable brain stimulator.   RECOMMENDATIONS:  [name] appears to be doing well from the  standpoint of his heart.  His biggest problem is related to edema of his  lower extremities.  We will plan to start him on Lasix 40 mg a day and  we will get a BMP in 5-7 days.  We will also get a CBC and iron and iron-  binding capacity, and ferritin level today.  I will see him back in 6  months.   ADDENDUM:  We will also get a venous and arterial Dopplers of both lower  extremities to document the present reactions of arterial disease and to  rule out deep vein thrombophlebitis.     Bruce Elvera Lennox Juanda Chance, MD, [address] [facility]  Electronically Signed    BRB/MedQ  DD: 09/02/2007  DT: 09/03/2007  Job #: [id number]

## 2010-07-23 NOTE — Op Note (Signed)
NAMEADAR, Lance NO.:  [id number]   MEDICAL RECORD NO.:  [id number]          PATIENT TYPE:  INP   LOCATION:  1529                         FACILITY:  [facility]   PHYSICIAN:  Vanita Panda. Magnus Ivan, M.D.DATE OF BIRTH:  01-29-1930   DATE OF PROCEDURE:  09/19/2007  DATE OF DISCHARGE:                               OPERATIVE REPORT   PREOPERATIVE DIAGNOSIS:  Right foot second toe infection and  osteomyelitis.   POSTOPERATIVE DIAGNOSIS:  Right foot second toe infection and  osteomyelitis.   PROCEDURE:  Right second toe amputation down to MTP joint.   SURGEON:  Vanita Panda. Magnus Ivan, MD.   ANESTHESIA:  General.   BLOOD LOSS:  Less than 100 mL.   COMPLICATIONS:  None.   INDICATIONS:  Briefly, Mr. Melland is a 77 year old with Parkinson's  disease who had undergone a previous right foot great toe and first ray  resection.  He has  had chronic cellulitis and had developed purulent  drainage from the end of his second toe.  X-ray showed evidence of  osteomyelitis and he had developed a diffuse cellulitis and swelling  around the foot and into the tibia.  He was admitted for IV antibiotics  and it is recommended he undergo a second toe amputation.  On plain  films, the remainder of the bones look viable in his foot and at some  point one of my partners, Dr. Lajoyce Corners, has talked with the patient about a  transmetatarsal amputation.  However, given the setting of active  infection, I feel it is prudent just to remove the second toe at this  standpoint to allow for infection to further allow for the cellulitis to  hopefully resolve.  The risks and benefits of this have been explained  to him in length and he and his family agree to proceed with surgery.   DESCRIPTION OF PROCEDURE:  After informed consent was obtained, the  appropriate right foot was marked.  He was brought to the operating room  and placed supine on the operating table.  General anesthesia was  obtained.  His right foot and ankle were then prepped and draped with  Betadine scrub and paint.  A time-out was called and he was identified  as the correct patient and correct extremity.  I then used a #10 blade  and was able to make an elliptical incision around the second toe and I  took this down to the level of the MTP joint.  I then removed the toe in  its entirety.  I did use an Esmarch around the ankle at the local  tourniquet.  Hemostasis was then easily obtained.  I then removed the  cartilage from the second metatarsal head for healing purposes.  I  thoroughly irrigated the wound and  then I closed the soft tissue and skin over this with interrupted 2-0  nylon suture.  Xeroform followed by well-padded sterile dressing were  applied.  All final counts were correct and there were no complications  noted.  The patient was awakened, extubated and taken to the recovery  room in stable  condition.      Vanita Panda. Magnus Ivan, M.D.  Electronically Signed     CYB/MEDQ  D:  09/19/2007  T:  09/19/2007  Job:  [id number]

## 2010-07-23 NOTE — Assessment & Plan Note (Signed)
[facility]                            CARDIOLOGY OFFICE NOTE   JIMY, GATES                        MRN:          [id number]  DATE:02/09/2007                            DOB:          1929-12-25    PRIMARY CARE PHYSICIAN:  Gloriajean Dell. Andrey Campanile, M.D.   Mr. Mandato is 77 years old and returns after his recent hospitalization  for GI bleeding.  He was seen in Dr. Fredrich Birks office with weakness and  was referred to [place] and was seen by Dr. Daleen Squibb who did a CBC which showed a  very low hemoglobin.  He was admitted to the hospital on the hospitalist  service.  He was seen in consultation by Dr. Russella Dar who found a gastric  ulcer.  He was transfused, and his Plavix and aspirin were held.  We  were asked to see him before discharge and concurred with holding his  Plavix and aspirin for now.   He has documented coronary disease.  In September of 2005, he had  directional atherectomy of a diagonal branch, LAD, and stenting of the  LAD with a Taxus stent.  He has had no recent chest pain, shortness of  breath, or palpitations.   PAST MEDICAL HISTORY:  1. Venous insufficiency in the lower extremities.  2. Parkinson's disease with a brain stimulator.  3. 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  4. Hyperlipidemia.   CURRENT MEDICATIONS:  Paroxetine, Mirapex, carbidopa-levadopa,  simvastatin, Comtan for Parkinson's, amantadine, and Nexium.   PHYSICAL EXAMINATION:  VITAL SIGNS:  Blood pressure 97/55, pulse 71 and  regular.  NECK:  There is no venous distension.  The carotid pulses were full  without bruits.  CHEST:  Clear without rales or rhonchi.  CARDIAC:  Rhythm was regular.  No murmurs or gallops.  ABDOMEN:  Soft with normal bowel sounds.  EXTREMITIES:  Peripheral pulses were full, and there was trace to 1+  peripheral edema.   IMPRESSION:  1. Recent gastrointestinal bleed related to gastric ulcer.  2. Coronary artery disease status post  percutaneous coronary      intervention with drug-eluting stent to the left anterior      descending in September of 2005.  3. Good left ventricular function.  4. Venous insufficiency of lower extremities.  5. Hyperlipidemia.  6. 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  7. Parkinson's disease status post brain stimulator.   RECOMMENDATIONS:  I think Mr. Winegarden is stable from the standpoint of  his heart.  He is currently off both Plavix and aspirin.  I am concerned  about this with his drug-eluting stent in the  LAD, and I am hopeful that we can get him at least back on a baby  aspirin.  I will discuss this with Dr. Russella Dar and will get with Mr.  Eischen after we make a decision.     Bruce Elvera Lennox Juanda Chance, MD, [facility]  Electronically Signed    BRB/MedQ  DD: 02/09/2007  DT: 02/09/2007  Job #: [id number]

## 2010-07-23 NOTE — Discharge Summary (Signed)
NAMEJCEON, ALVERIO NO.:  [id number]   MEDICAL RECORD NO.:  [id number]          PATIENT TYPE:  INP   LOCATION:  1529                         FACILITY:  [facility]   PHYSICIAN:  Vanita Panda. Magnus Ivan, M.D.DATE OF BIRTH:  1929/06/14   DATE OF ADMISSION:  09/18/2007  DATE OF DISCHARGE:  09/21/2007                               DISCHARGE SUMMARY   ADMITTING DIAGNOSIS:  Second toe infection and cellulitis.   DISCHARGE DIAGNOSIS:  Right second toe infection and cellulitis.   PROCEDURES:  Right foot second toe amputation on September 19, 2007.   HOSPITAL COURSE:  Briefly, Mr. Figley is a 77 year old male who was  admitted on September 18, 2007, with infection involving his right second  toe.  He had a previous history of a right great toe infection and ray  amputation.  He had developed worsening cellulitis on his second toe and  obvious purulent drainage from the end of this area.  It was recommended  that he undergo admission to the orthopedic surgery service for  initiation of IV antibiotics and then surgical intervention.  He was  taken to the operating room on September 19, 2007, where he underwent a right  second toe amputation as well as irrigation and debridement without  complications.  Postoperatively I let him ambulate in a postoperative  shoe and he continued on IV antibiotics until I felt he could be safely  transitioned to oral antibiotics.  His cellulitis resolved and his  swelling had decreased greatly during his hospitalization and there were  no further complicating features.  By the day of discharge he was  tolerating a regular diet as well as oral pain medications and it was  felt he could be discharged safely to home on oral antibiotics.   DISCHARGE MEDICATIONS:  Please refer to his medical reconciliation form  for resuming all his home medications as he was on before.  New  medications include doxycycline 100 mg p.o. b.i.d.   DISCHARGE INSTRUCTIONS:  While he  is at home he will continue to keep  his incision clean and dry on his foot and should change this dressing  daily.  A followup appointment should be established in our office at  [facility] in 1 week after discharge.      Vanita Panda. Magnus Ivan, M.D.  Electronically Signed     CYB/MEDQ  D:  10/23/2007  T:  10/23/2007  Job:  [id number]

## 2010-07-23 NOTE — Op Note (Signed)
NAME:  [name], BERNABEI NO.:  [id number]   MEDICAL RECORD NO.:  [id number]          PATIENT TYPE:  AMB   LOCATION:  SDS                          FACILITY:  [facility]   PHYSICIAN:  Kerrin Champagne, M.D.   DATE OF BIRTH:  1930-02-11   DATE OF PROCEDURE:  01/19/2007  DATE OF DISCHARGE:                               OPERATIVE REPORT   PREOPERATIVE DIAGNOSIS:  Severe left carpal tunnel syndrome with axonal  loss, both motor and sensory changes.   POSTOPERATIVE DIAGNOSIS:  Severe left carpal tunnel syndrome with axonal  loss, both motor and sensory changes.   PROCEDURE:  Left open carpal tunnel release.   SURGEON:  Kerrin Champagne, M.D.   ASSISTANT:  Wende Neighbors, P.A.   ANESTHESIA:  General via oral esophageal obturator.   FINDINGS:  Severe thickening of the left transverse carpal ligament in  the distal volar forearm fascia causing severe median nerve entrapment.  The motor branch is intact and normal.   ESTIMATED BLOOD LOSS:  5 to 10 mL.   TOTAL TOURNIQUET TIME:  250 mmHg, 20 minutes.   BRIEF CLINICAL HISTORY:  The patient is a 77 year old male with a  history of significant Parkinson disease.  He has indwelling brain  neural stimulator to control his Parkinson disease, on multiple anti-  Parkinson medicines which will be recontinued immediately following the  surgery.  The patient has been followed in the office for symptoms of  carpal tunnel syndrome, positive Tinel sign, positive Phalen's sign.  EMGs, nerve conduction studies demonstrating motor changes in the left  hand and severe latency changes across the left wrist secondary to  median nerve entrapment.  He has had a previous right carpal tunnel  release.  Because of his indwelling brain stimulator, the patient is  felt to be at increased risks and, therefore, the surgery is to be done  in the [facility] main operating room.   INTRAOPERATIVE FINDINGS:  As above.   DESCRIPTION OF PROCEDURE:   After adequate general anesthesia, the left  upper extremity tourniquet prepped with DuraPrep solution from  fingertips to the elbow, tourniquet about the left upper extremity arm.  He was draped in the usual manner.  The patient had exsanguination of  the left upper extremity with an Esmarch bandage.  The tourniquet  inflated to 250 mmHg.  An incision over the volar aspect of the left  wrist approximately 3 mm ulnarward to the thenar crease and then  diagonally across the wrist crease extending ulnarward in line with the  fourth digit.  Through the skin and subcutaneous layers, carried down to  the superficial fascia overlying the distal forearm retracting the  palmaris longus tendon ulnarward.  The palmaris tendon was extremely  wispy and very, very small, maybe 2 to 3 mm at most.  The distal forearm  fascia was then lifted with Adson forceps and divided using [employer].  This was then carried proximally exposing the median nerve  beneath.  Then the [employer] were used to divide the patient's  volar distal forearm fascia  in line with the incision extending distally  in line with the fourth digit.  Transverse carpal ligament was  identified.  Then a freer elevator passed beneath the transverse carpal  ligament, identifying the hook of the hand made laterally, in a  continuing incision using a 15-blade scalpel through the tendinous  portion of the patient's transverse carpal ligament.  This was divided  then beneath the distal flap of the incision.  The incision was a total  length of about an inch to an inch and a half.  Ragnell retractors as  well as Senn retractors were used to elevate the skin in order to allow  for continued dissection and continued release of the palmar fascia  distalward.  A few fibers of the thenar eminence were identified and  divided muscle fibers that were present across the midline.  This was  continued distalward until the ulnar fascia was  divided and the  superficial transverse arch of the palm was identified and found to be  intact.  This completed the carpal tunnel release.  Not that the  proximal extend of the release was also carried out by elevation of the  skin using the retractors provided, then carefully dissecting the layers  between the skin and subcutaneous layers overlying the distal forearm  fascia and then dividing the fascia overlying the median nerve until the  nerve was completely freed.  Following this, the tourniquet was  released.  Bleeders were controlled using bipolar electrocautery.  The  skin was then approximated with interrupted vertical and mattress  sutures of 4-0 nylon.  Pressure applied, tourniquet was removed from the  arm and there was excellent hemostasis at this point.  Adaptic, 4 x 4's  affixed to the skin with sterile Webril and then a well padded volar  wrist splint was applied with the wrist in 20 degrees of dorsiflexion.  An Ace wrap applied.  The patient then reactivated, extubated and  returned to the recovery room in satisfactory condition.  All instrument  and sponge counts were correct.   POSTOPERATIVE CARE:  The patient will elevate the left wrist and he will  used ice locally, hydrocodone for discomfort, be seen back in the office  in followup in two weeks for removal of sutures.      Kerrin Champagne, M.D.  Electronically Signed     JEN/MEDQ  D:  01/19/2007  T:  01/19/2007  Job:  [id number]

## 2010-07-23 NOTE — Op Note (Signed)
NAMEHADDON, FYFE NO.:  [id number]   MEDICAL RECORD NO.:  [id number]          PATIENT TYPE:  OIB   LOCATION:  3028                         FACILITY:  [facility]   PHYSICIAN:  Nadara Mustard, MD     DATE OF BIRTH:  18-Dec-1929   DATE OF PROCEDURE:  07/22/2007  DATE OF DISCHARGE:                               OPERATIVE REPORT   PREOPERATIVE DIAGNOSIS:  Osteomyelitis, right great toe.   POSTOPERATIVE DIAGNOSIS:  Osteomyelitis, right great toe.   PROCEDURE:  Right foot first ray amputation.   SURGEON:  Nadara Mustard, MD   ANESTHESIA:  Ankle block.   ESTIMATED BLOOD LOSS:  Minimal.   ANTIBIOTICS:  One gram of Kefzol.   DRAINS:  None.   COMPLICATIONS:  None.   SPECIMEN:  Sent to pathology.   DISPOSITION:  To PACU in stable condition.   INDICATIONS FOR PROCEDURE:  The patient is a 77 year old gentleman with  insensate neuropathy who has had a chronic clawing of the great toe.  He  has failed conservative care with pressure loading and has developed  osteomyelitis with loss of the tuft of the great toe.  Due to  progression of the osteomyelitis and cellulitis involving the right  lower extremity, the patient presents at this time for a right foot  first ray amputation.  Risks and benefits were discussed including  infection, neurovascular injury, nonhealing of the wound, and need for  additional surgery.  The patient and his family state they understand  and wish to proceed at this time.   DESCRIPTION OF PROCEDURE:  The patient was brought to OR room-1 after  undergoing an ankle block.  After adequate level of anesthesia was  obtained, the patient's right lower extremity was prepped using DuraPrep  and draped into a sterile field.  The right incision was used.  Based on  the base of the first metatarsal, this curved around the toe and  incorporated the ulcer and the great toe.  The great toe and first  metatarsal were resected in one block of tissue.   Electrocautery was  used for hemostasis.  The wound was irrigated with normal saline.  There  was good healthy viable tissue.  The incision was closed with 2-0 nylon  with a modified vertical mattress suture.  The wound  was covered with Adaptic orthopedic sponges, ABD dressing, Webril, and a  Coban dressing.  The patient was also placed in an Unna boot-type  dressing for the venous stasis edema in the right lower extremity.  Plan  for admission and possible discharge in the morning.      Nadara Mustard, MD  Electronically Signed     MVD/MEDQ  D:  07/22/2007  T:  07/23/2007  Job:  [id number]

## 2010-07-23 NOTE — Assessment & Plan Note (Signed)
HEALTHCARE                            CARDIOLOGY OFFICE NOTE   [name], [name]                        MRN:          [id number]  DATE:02/10/2008                            DOB:          01-12-1930    CLINICAL HISTORY:  [name] is 77 years old and returns for management  of his coronary heart disease.  He had a directional atherectomy of the  diagonal branch of the LAD and stenting of the LAD with a Taxus stent in  2005.  He has done quite well since that time.  He has had no recent  chest pain or palpitations.  He is quite inactive, so he is very  deconditioned and gets short of breath when he does things more active  than he is used to.  He is now off Plavix because of some previous GI  bleeds.   PAST MEDICAL HISTORY:  Significant for Parkinson disease for which he  has a brain stimulator.  He has degenerative joint disease of his knees.  He has hyperlipidemia.  He developed osteomyelitis of the toes in his  right foot and had 3 toes removed over several months.   His current medications include paroxetine, Mirapex, carbidopa aspirin,  simvastatin 40 mg daily, Comtan, amantadine, pantoprazole, and Mentax   His past history was also significant for anemia and he had been on iron  for a time, but he had recent labs and that showed hemoglobin of 12 and  a normal iron.  He is off iron currently.   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  He is married and  lives with his wife.   PHYSICAL EXAMINATION:  VITAL SIGNS:  The blood pressure was 122/65 and  pulse 59 and regular.  NECK:  There is no venous distention.  The carotid pulses were full  without bruits.  CHEST:  Clear.  CARDIAC:  Rhythm was regular.  I could hear no murmurs.  ABDOMEN:  Soft.  There was the brain stimulator generator in the left  upper quadrant.  EXTREMITIES:  There was 1+ peripheral edema  with stasis changes.  He has  had previous amputations of his right toes.   IMPRESSION:  1. Coronary artery disease status post percutaneous coronary      intervention of the left anterior descending with a drug-eluting      stent September 2005, stable.  2. Good left ventricular function.  3. Recurrent gastrointestinal bleeding related to gastric ulcer, now      stable.  4. Venous insufficiency of the lower extremities.  5. Status post amputation of the right toes for osteomyelitis.  6. Hyperlipidemia.  7. 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  8. Parkinson disease with implantable brain stimulator.   RECOMMENDATIONS:  I think, [name] is doing well from a cardiac  standpoint.  We will continue the same medications.  He will come in for  a fasting lipid profile.  I will see him back in followup in  1 year.     Bruce Elvera Lennox Juanda Chance, MD, [facility]  Electronically Signed    BRB/MedQ  DD: 02/10/2008  DT: 02/11/2008  Job #: [id number]

## 2010-07-23 NOTE — Op Note (Signed)
NAMEWINN, MUEHL NO.:  [id number]   MEDICAL RECORD NO.:  [id number]          PATIENT TYPE:  OIB   LOCATION:  5018                         FACILITY:  [facility]   PHYSICIAN:  Nadara Mustard, MD     DATE OF BIRTH:  [birth year]   DATE OF PROCEDURE:  10/26/2007  DATE OF DISCHARGE:                               OPERATIVE REPORT   PREOPERATIVE DIAGNOSIS:  Osteomyelitis, right foot, third toe.   POSTOPERATIVE DIAGNOSIS:  Osteomyelitis, right foot, third toe.   PROCEDURE:  Right foot transmetatarsal amputation.   SURGEON:  Nadara Mustard, MD   ANESTHESIA:  General.   ESTIMATED BLOOD LOSS:  Minimal.   ANTIBIOTICS:  Kefzol 1 g.   TOURNIQUET TIME:  Esmarch to the ankle for approximately 20 minutes.   SPECIMEN:  Sent to Pathology.   DISPOSITION:  To PACU in stable condition.   INDICATIONS FOR PROCEDURE:  The patient is a 77 year old gentleman with  severe Parkinson's with claw toeing of his third, fourth, and fifth  toes.  He is status post a great toe and second toe amputation, and  presents at this time with osteomyelitis of the third toe due to flex  claw deformity.  Due to his previous 2 other amputations and now the  third toe infection, the patient presents for a transmetatarsal  amputation.  Risks and benefits of surgery were discussed including  infection, neurovascular injury, nonhealing of the wound, and need for  additional surgery.  The patient states he understands and wished to  proceed at this time.   DESCRIPTION OF PROCEDURE:  The patient was brought to OR room-1 and  underwent a general anesthetic.  After adequate level of anesthesia was  obtained, the patient's right lower extremity was prepped using DuraPrep  and draped into a sterile field.  A fishmouth-type incision was made  extending to the flexor crease of the toes.  This was then reflected  proximally.  A transmetatarsal amputation was performed with metatarsals  beveled plantarly and  curved from medial to lateral.  The bony edges  were smoothed with a rongeur.  The wound was irrigated with normal  saline.  Electrocautery was used for hemostasis.  An Esmarch was wrapped  around the ankle for tourniquet control.  The Esmarch was released, and  hemostasis was obtained.  The wound was irrigated with normal saline.  The wound edges were closed using a modified vertical mattress suture as  well as a far-near-near-far suture.  There  was no tension on the wound edges.  The wound was covered with Adaptic,  orthopedic sponges, ABD dressing, Webril, and Coban.  The patient was  extubated and taken to PACU in stable condition.  Plan for 24-hour  observation.      Nadara Mustard, MD  Electronically Signed     MVD/MEDQ  D:  10/26/2007  T:  10/27/2007  Job:  [id number]

## 2010-07-23 NOTE — H&P (Signed)
NAMEPADRAIC, [name] NO.:  [id number]   MEDICAL RECORD NO.:  [id number]          PATIENT TYPE:  INP   LOCATION:  6740                         FACILITY:  [facility]   PHYSICIAN:  Hillery Aldo, M.D.   DATE OF BIRTH:  Nov 12, 1929   DATE OF ADMISSION:  01/25/2007  DATE OF DISCHARGE:                              HISTORY & PHYSICAL   PRIMARY CARE PHYSICIAN:  Dr. Andrey Campanile at [facility].   CARDIOLOGIST:  Dr. Juanda Chance.   CHIEF COMPLAINT:  Shortness of breath, tarry black stool.   HISTORY OF PRESENT ILLNESS:  The patient is a 77 year old male with past  medical history of coronary artery disease maintained on Aspirin and  Plavix therapy, as well as a past medical history of acute GI bleed  secondary to gastric ulcer who presented to his cardiologist today for  evaluation of progressive shortness of breath.  During the course of his  visit, he mentioned to his cardiologist that he has been having black  tarry stools for the past 24 hours.  A hemoglobin and hematocrit check  was done and he was found to have approximately 4 gram drop in his  hemoglobin from his normal baseline.  It was recommended that the  patient present for direct admission to the hospital for further  evaluation and workup.  The patient also endorses some transient  lightheadedness yesterday, but no frank syncope.  He is being admitted  for stabilization and GI consultation for presumed recurrent gastric  ulcer with bleed.   PAST MEDICAL HISTORY:  1. Coronary artery disease status post drug eluding stent to the      LAD/atherectomy to the diagonal September, 2005 with some residual      disease medically managed  2. Non-ST elevation MI in 2007 status post back surgery.  3. Status post left open carpal tunnel release November, 2008.  4. Severe Parkinson's disease status post deep brain stimulator.  5. Chronic lower extremity venous insufficiency with history of      cellulitis.  6.  Degenerative disk disease/degenerative joint disease of the spine,      chronic back pain.  7. Hyperlipidemia.  8. Spinal stenosis status post laminectomy in 2005 with fusion of L4-      L5 and L5-S1.  9. History of nephrolithiasis.  10.History of tuberculosis as a child.  11.History of gastric ulcer with bleed in April, 2006.  12.Status post right total knee replacement.  13.Status post cataract extraction 2005.  14.History of anemia.  15.History of diverticulosis.   ALLERGIES:  MORPHINE, SUBLINGUAL NITROGLYCERIN, SEPTRA.   CURRENT MEDICATIONS:  1. Darvocet-N 100 one p.o. q.4h. p.r.n.  2. MiraLax as needed.  3. Paroxetine 20 mg daily.  4. Mirapex 1 mg 1/2 tablet q.3h.  5. Carbidopa 25/250 1/2 tablet q.3h.  6. Plavix 75 mg daily.  7. Simvastatin 40 mg daily.  8. Amantadine 100 mg daily.  9. Aspirin 81 mg daily.   FAMILY HISTORY:  The patient's mother died of a ruptured aortic  abdominal aneurysm.  The patient's father died at 25 from old age.  He  has one brother who has diabetes.  He has two sisters, one was suffers  with Parkinson's disease.   SOCIAL HISTORY:  The patient is married and lives with his wife.  He is  a retired Radio producer.  He is a lifelong nonsmoker.  No alcohol or  drug use.   REVIEW OF SYSTEMS:  The patient has not had any fever or chills.  He  denies any abdominal pain.  No problems with his appetite.  He has had  some decreased energy.  Denies any chest pain.  No cough.  His bowel  movement today was formed.  He has a history of constipation.  There has  been no associated nausea, vomiting or diarrhea.  No dysuria.  No  musculoskeletal complaints except for his usual chronic back pain.   PHYSICAL EXAM:  VITAL SIGNS: Temperature 98.6, blood pressure 152/78,  pulse 63, respirations 18, O2 saturation 100% on room air.  GENERAL:  Well-developed, well-nourished male in no acute distress.  HEENT: Normocephalic, atraumatic.  The patient does have a  deep brain  stimulator visible in his left parietal area.  PERRL.  EOMI.  Oropharynx  clear.  NECK:  Supple, no thyromegaly, no lymphadenopathy, no jugular venous  distention.  CHEST:  Lungs clear to auscultation bilaterally with good air movement.  HEART:  Regular rate, rhythm.  No murmurs, rubs, or gallops.  ABDOMEN:  Soft, nontender, nondistended with normoactive bowel sounds.  RECTAL:  Melanotic stool which is strongly heme positive on guaiac  testing.  EXTREMITIES:  No clubbing, edema, cyanosis.  He does have TED hose on.  NEUROLOGIC:  The patient is alert and oriented x3.  Cranial nerves II-  XII grossly intact.  Nonfocal.  SKIN:  Warm, dry.  No rashes.   Data reviewed.   LABORATORY DATA:  White blood cell count is 8.4, hemoglobin 8.6,  hematocrit 25.3, platelets 174.  Coagulation studies and a basic  metabolic panel are pending at the time of dictation.   ASSESSMENT/PLAN:  1. Acute blood loss anemia secondary to recurrent gastrointestinal      bleed, likely upper:  The patient will be admitted and monitored      closely for hemodynamic stability.  We will start two large-bore      intravenous and put him on normal saline at a rate of 120 mL an      hour.  We will check a hemoglobin and hematocrit q.6h. and given      his coronary disease, transfuse him to maintain a hemoglobin level      of 10 mg/dL.  We will hold his Plavix.  We will start him on      intravenous proton-pump-inhibitor therapy q.12h. intravenous.  We      will obtain a gastrointestinal consultation in the morning for      consideration of an upper endoscopy.  2. Coronary artery disease:  Patient is not having any complaints of      chest pain.  We will monitor him closely for signs of      decompensation.  3. Venous insufficiency:  Continue the patient on TED hose as well PAS      hoses.  4. Hyperlipidemia:  Continue the patient's statin therapy.  5. Prophylaxis:  The patient will be on intravenous  Protonix and PAS      hoses for gastrointestinal and deep vein thrombosis prophylaxis.  6. Parkinson's disease:  We will continue the patient's anti-Parkinson      therapy.  Hillery Aldo, M.D.  Electronically Signed     CR/MEDQ  D:  01/25/2007  T:  01/26/2007  Job:  [id number]   cc:   Everardo Beals. Juanda Chance, MD, [facility]  Gloriajean Dell. Andrey Campanile, M.D.

## 2010-07-23 NOTE — Discharge Summary (Signed)
NAMESHERMAR, FRIEDLAND                 ACCOUNT NO.:  [id number]   MEDICAL RECORD NO.:  [id number]          PATIENT TYPE:  INP   LOCATION:  6740                         FACILITY:  [facility]   PHYSICIAN:  Hind I Elsaid, MD      DATE OF BIRTH:  Jun 06, 1929   DATE OF ADMISSION:  01/25/2007  DATE OF DISCHARGE:  01/28/2007                               DISCHARGE SUMMARY   PRIMARY CARE PHYSICIAN:  Dr. Andrey Campanile at [facility]   CARDIOLOGIST:  Dr. Charlies Constable   DISCHARGE DIAGNOSES:  1. Upper gastrointestinal bleeding secondary to gastric ulcer.  2. Acute blood loss anemia secondary to #1.  3. Coronary artery disease status post drug-eluting stent in the left      anterior descending and arthrectomy to the diagonals in 2005.  4. History of non-ST-elevation infarction in 2007 with ejection      fraction of 65% at the last stress Myoview in 2007.  5. Parkinson disease status post brain stimulator.  6. Hyperlipidemia.  7. Peripheral vascular disease.  8. Status post left open carpal tunnel surgery.  9. Chronic lower extremity venous insufficiency.  10.History of degenerative disc disease/degenerative joint disease of      the spine.  11.Status post right total knee replacement.  12.Status post cataract extraction.  13.History of diverticulosis.   DISCHARGE MEDICATIONS:  1. Protonix 40 mg p.o. q.a.m.  2. Paroxetine 20 mg p.o. daily.  3. Mirapex 1 mg 1/2 tab q.3 hours.  4. Carbidopa 25/250 half tab q.3 hours.  5. Simvastatin 40 mg p.o. q.daily.  6. Amantadine 100 mg p.o. daily.   CONSULTATIONS:  1. Gastroenterology consulted for the evaluations of the melena.  2. Dr. Juanda Chance was to follow up the patient during hospitalization.   PROCEDURES:  1. Chest x-ray:  No acute cardiopulmonary disease.  2. EGD which shows ulcer in the antrum, not bleeding, clear ulcer base      with antrum to pyloric sphincter erosion present.  Biopsy was done      from that site.   HISTORY OF PRESENT  ILLNESS:  This is a 77 year old male, very pleasant  gentleman with a history of coronary artery disease on aspirin and  Plavix, history of GI bleeding secondary to gastric ulcer, presented to  his cardiologist, Dr. Charlies Constable, for evaluation of progressive  shortness of breath.  During the course of his visit, he mentioned he  had black tarry stool for the past 24 hours.  At that time, hemoglobin  and hematocrit checked which was found around 8 which is a 4 gm drop in  his hemoglobin from his normal baseline.  At that time, patient was  recommended to be evaluated at the emergency room.   1. Upper GI bleeding.  Patient was admitted to the hospital and      started on IV fluid and IV Protonix, also patient received total of      three units of packed RBCs during hospitalization.  Aspirin and      Plavix were held secondary to the above problem.  Gastroenterology  consulted, done by Dr. Claudette Head where he recommended EGD; the      report as above shows evidence of ulcer status post biopsy and      recommendations were to continue with Protonix 40 mg p.o. q.a.m.      During hospitalization, hemoglobin remained stable around 11.      Shortness of breath completely resolved.  Further recommendations      by Dr. Russella Dar to hold aspirin.  For that, Dr. Juanda Chance was also asked      to evaluate the patient where he agreed with stopping Plavix and      hold aspirin for two weeks, and need to keep appointment with Dr.      Juanda Chance on December 2.  Patient's symptoms completely resolved and      H&H remained stable.  We felt that the patient is medically stable      to be discharged on Protonix 40 mg p.o. q.a.m.; follow the biopsy      report with Dr. Claudette Head and followup appointment with his      cardiologist, Dr. Juanda Chance, for further evaluation of the aspirin and      the Plavix.  2. Coronary artery disease status post stent and history of non-ST-      elevation MI.  Patient remained  stable with no evidence of      shortness of breath or active cardiac event.  3. Parkinson disease.  Patient remained on Sinemet and other      medications.   DISPOSITION:  We feel that the patient is medically stable to be  discharged home and follow with Dr. Claudette Head the result of the  biopsy.  Follow with Dr. Juanda Chance for further evaluation to restart the  aspirin and Plavix.      Hind Bosie Helper, MD  Electronically Signed     HIE/MEDQ  D:  01/28/2007  T:  01/28/2007  Job:  [id number]   cc:   Dr. Timmothy Sours R. Juanda Chance, MD, [facility]

## 2010-07-23 NOTE — Assessment & Plan Note (Signed)
[facility]                            CARDIOLOGY OFFICE NOTE   JASHUA, KNAAK                        MRN:          [id number]  DATE:01/13/2007                            DOB:          1929/08/25    PRIMARY CARE PHYSICIAN:  Dr. Benedetto Goad.   ORTHOPEDIST:  Dr. Otelia Sergeant.   CLINICAL HISTORY:  Mr. Saraceno is 77 years old and returns for a cardiac  evaluation and evaluation prior to carpal tunnel surgery.  He has  documented coronary disease and atherectomy of a diagonal branch and  drug-eluting stent in the left anterior descending artery in September  of 2005.  He has residual disease in a marginal branch of the circumflex  artery.  He had a non-ST elevation infarction following back surgery by  Dr. Otelia Sergeant in 2007 but no EKG changes, and had a Myoview scan  subsequently which was nonischemic.   He has done quite well from the standpoint of his heart with no chest  pain or palpitations.  He does say that he gets a little more short of  breath with activities, although he has had to be very inactive because  of his Parkinson's disease and he is less active now than he was a year  ago.   His past medical history is significant for venous insufficiency in the  lower extremities with a history of cellulitis which has been managed  with support hose.  He also has Parkinson's disease and has a brain  stimulator.  He also has degenerative disease of the disk and lumbar  spine and hyperlipidemia.   CURRENT MEDICATIONS:  1. Paroxetine.  2. Mirapex.  3. Carbidopa/levodopa.  4. Plavix.  5. Aspirin.  6. Simvastatin.  7. Comtan.  8. Amantadine.   PHYSICAL EXAMINATION:  The blood pressure was 141/79, pulse 59 and  regular.  There was no venous distension.  Carotid pulses were full without  bruits.  CHEST:  Clear.  Cardiac rhythm was regular.  I could hear no murmurs or gallops.  The abdomen was soft without organomegaly.  Peripheral pulses were full  and there was trace edema, and there were  stasis changes in the lower extremities.   IMPRESSION:  1. Coronary artery disease, status post percutaneous coronary      intervention with a drug-eluting stent in 2005, now stable.  2. Good left ventricular function.  3. Venous insufficiency in the lower extremities.  4. Hyperlipidemia.  5. Parkinson's disease, status post brain stimulator.  6. Degenerative joint disease.  7. Lumbar spine disease.  8. Carpal tunnel syndrome.   RECOMMENDATIONS:  I think Mr. Sinning is stable from a cardiac  standpoint.  I suspect that symptoms of shortness of breath are related  to deconditioning.  I think it is okay for him to come off the Plavix,  but I would recommend that he continue the aspirin through the surgery.  I have told both him and his wife that and they will be sure that Dr.  Barbaraann Faster team knows that when they see them early next week.  I will plan  to see him back in followup in 6 months.  We will also get a fasting  lipid profile, BNP and CBC tomorrow.     Bruce Elvera Lennox Juanda Chance, MD, [facility]  Electronically Signed    BRB/MedQ  DD: 01/13/2007  DT: 01/14/2007  Job #: [id number]   cc:   Kerrin Champagne, M.D.

## 2010-07-23 NOTE — Assessment & Plan Note (Signed)
[facility]                         GASTROENTEROLOGY OFFICE NOTE   Lance, [name]                        MRN:          [id number]  DATE:05/24/2007                            DOB:          Sep 29, 1929    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  Last episode of bleeding occurred in November 2008, and it was treated  during his hospitalization by Dr. Russella Dar. He was at that point taken off  Plavix and aspirin and put on a proton pump inhibitor. He since then has  resumed his aspirin 81 mg a day because he has drug-eluting stents which  were placed in 2005. He has been followed by Dr. Charlies Constable on a  regular basis. He is currently doing well, and his initial hemoglobin of  8.6 after two units of packed cells has risen to 12.6 at the end of  January 2009. He denies any black stools or abdominal pain.   MEDICATIONS:  1. Paroxetine 20 mg p.o. every day.  2. Mirapex 1 mg 1/2 tablet q 3 hours.  3.  1. Aspirin 81 mg p.o. every day.  2.  3. Nexium 40 mg every day.   PAST MEDICAL HISTORY:  1. Large ventral hernia.  2. Anterior umbilical hernia.  3. History of colon polyps on colonoscopy in April 2004.  4. Gastric ulcer on upper endoscopy in April 2006 and again in      November 2008.  5. Coronary artery disease status post percutaneous coronary      intervention in 2005.  6. Obesity.  7. Parkinson's disease.  8. Hyperlipidemia.  9. Anemia.  10.He had previous lumbosacral spine surgery for spinal stenosis.   FAMILY HISTORY:  Negative colon cancer.   SOCIAL HISTORY:  The patient has four children. He has a Ph.D. in  biology. He does not smoke. He does not drink alcohol.   REVIEW OF SYSTEMS:  Positive for arthritic complaints, shortness of  breath.   PHYSICAL  EXAMINATION:  Blood pressure 137/74, pulse 64, and weight 227  pounds. He was chronically ill-appearing, somewhat frail, moved very  slowly but was able to sit on the examining table.  LUNGS:  Clear.  CORONARY:  Normal S1, normal S2.  ABDOMEN:  Somewhat protuberant intense, but nontender with large ventral  hernia in the midline and large umbilical hernia which was easily  reducible. Bowel sounds were normoactive. There was no palpable  tenderness.  RECTAL:  Hemoccult negative stool.  EXTREMITIES:  2+ edema.   IMPRESSION:  A 77 year old white male with recent upper gastrointestinal  bleed . This was the second episode in the last two years while the  patient was on aspirin and Plavix. He is doing currently well with no  evidence of gastrointestinal blood loss and taking his proton pump  inhibitors. Dr. Charlies Constable would prefer to have the patient back on  Plavix because of drug-eluting Taxus stent placed in the LAD  in  September 2005.   PLAN:  1. I would like to discuss the need for Plavix with Dr. Charlies Constable.      At the same time, we will schedule the patient for upper endoscopy.      If the ulcer is completely healed, I would consider putting him      back on Plavix. If he has a chronic-appearing ulcer and if it turns      out to be a malignant ulcer, then naturally he would not be a      candidate for resumption of Plavix.  2. We will switch him from Nexium to Pantoprazole 40 mg daily because      of the cost effectiveness.  3. Continue aspirin 81 mg daily.  4. CBC iron studies today.     Hedwig Morton. Juanda Chance, MD  Electronically Signed    DMB/MedQ  DD: 05/24/2007  DT: 05/24/2007  Job #: [id number]   cc:   Everardo Beals. Juanda Chance, MD, [facility]  Gloriajean Dell. Andrey Campanile, M.D.  Ursula Beath, MD

## 2010-07-23 NOTE — Assessment & Plan Note (Signed)
[facility]                            CARDIOLOGY OFFICE NOTE   [name] [name]                        MRN:          [id number]  DATE:01/25/2007                            DOB:          [birth year]    I was asked today by Dr. Venetia Maxon to see [name] for dyspnea on  exertion.   [name] is a patient of Dr. Charlies Constable.  He saw him in the office  on January 13, 2007 to clear him for carpal tunnel surgery.   His problem list includes coronary artery disease.  He presented with  chest discomfort and angina per his history back then.  He had a drug-  eluting stent in the LAD and atherectomy to the diagonal in September of  2005.  He then had a non-ST-elevation infarct after back surgery in  2007.  On February 20, 2005, he had a stress Myoview that showed an EF  of 65%.  Apparently, he had a stress Myoview after his infarct in 2007,  which was nonischemic per Dr. Regino Schultze note.   Over the last couple of weeks, he has had dyspnea on exertion.  He  denies any angina.  He has had no orthopnea, PND, or increased  peripheral edema.  In fact, his wife says his edema in his legs is a  little bit better.   He denies any fevers, chills, sweats, cough, or hemoptysis.  He did note  a black stool this morning and he does have a history of ulcer disease.  It was not particularly foul-smelling.   He is on aspirin and Plavix.   MEDICATIONS:  1. Aspirin 81 mg per day.  2. Plavix 75 mg per day.  3. Carbidopa 25/350 one-half q.3h.  4. Mirapex 1/2 tab q.3h.  5. Paroxetine 20 mg a day.  6. Simvastatin 40 mg a day.  7. Amantadine 100 mg a day.   EXAM:  He is not particularly pale.  Conjunctivae pink.  Sclerae clear.  PERRLA.  Extraocular movements  intact.  Facial symmetry is normal.  He is alert and oriented.  His respiratory rate is only 18.  He is in no acute distress.  SKIN:  Warm and dry.  Carotids are full.  There is no thyromegaly. Trachea is  midline.  LUNGS:  Clear to auscultation posteriorly.  HEART:  Reveals a poorly-appreciated PMI.  He has a normal S1, S2  without an S3.  ABDOMEN:  Protuberant.  Good bowel sounds.  There is no obvious  tenderness.  He has a distended umbilical hernia, which is stable.  EXTREMITIES:  Reveal 1+ pitting edema on the right.  Trace on the left.  There was no sign of DVT.  Pulses were intact.  NEURO:  Grossly intact, except for his Parkinson's disease.   ELECTROCARDIOGRAM:  Hard to interpret because of his brain stimulator.  It has not changed since his previous ECG.   I am concerned about the black tarry stool that [name] had this  morning.  He had a hemoglobin checked on November 10, which was 12.2.  We will check a stat one today.  Also obtain a 2D echocardiogram to rule  out diastolic dysfunction.  This may be an anginal equivalent, though he  has had angina in the past.  I will have him see Dr. Juanda Chance back for  close followup in a week to 10 days for further instructions.     Thomas C. Daleen Squibb, MD, [facility]  Electronically Signed    TCW/MedQ  DD: 01/25/2007  DT: 01/25/2007  Job #: [id number]   cc:   Everardo Beals. Juanda Chance, MD, [facility]

## 2010-07-26 ENCOUNTER — Telehealth: Payer: Self-pay | Admitting: *Deleted

## 2010-07-26 NOTE — Op Note (Signed)
NAMEKARY, COLAIZZI NO.:  [id number]   MEDICAL RECORD NO.:  [id number]          PATIENT TYPE:  INP   LOCATION:  2899                         FACILITY:  [facility]   PHYSICIAN:  Kerrin Champagne, M.D.   DATE OF BIRTH:  06-09-1929   DATE OF PROCEDURE:  01/03/2005  DATE OF DISCHARGE:                                 OPERATIVE REPORT   PREOPERATIVE DIAGNOSIS:  Central herniated nucleus pulposus L3-4, painful  spondylolisthesis L4-5 and L5-S1 with foraminal entrapment bilateral L5-S1.   POSTOPERATIVE DIAGNOSIS:  Painful spondylolisthesis L4-5 and L5-S1 with  foraminal stenosis L5-S1, degenerative disc disease with osteophytes and  stenosis within the lateral recess on the left side and posteriorly at the  L3-4 level.   PROCEDURE:  Right-sided redo lateral recess decompression L4-5 and L3-4.  Right L5-S1 facetectomy lateral recess decompression and foraminal  decompression.  TLIF L3-4 on the left side using a 7 mm leopard cage with  local bone graft.  TLIF right L4-5, again using a 7 mm leopard cage with  local bone graft.  TLIF right L5-S1 using a 7 mm leopard cage with local  bone graft.  Posterolateral fusion L3 to S1 three levels using a combination  of local bone graft and Symphony allograft bone graft material.  Internal  fixation posteriorly using Monarch pedicle screws and rods L3 through S1.   SURGEON:  Kerrin Champagne, M.D.   ASSISTANT:  Wende Neighbors, P.A.-C.   ANESTHESIA:  General via orotracheal intubation per Dr. Burna Forts.   SPECIMENS:  None.   ESTIMATED BLOOD LOSS:  1000 mL.   FLUIDS REPLACED:  The patient received 1000 mL of Crystalloid.  He received  two units of autogenous blood and one unit of Cell Saver blood.   COMPLICATIONS:  None.   DRAINS:  Medium Hemovac drain exiting over the right lower lumbar spine.  Foley catheter to straight drain.   BRIEF CLINICAL HISTORY:  This patient is a 77 year old male with a history  of  Parkinson's disease and a severe spinal stenosis at the L3-4 level.  He  underwent a central decompressive laminectomy at L3-4 and at L4-5 for severe  lumbar spinal stenosis associated with these segments.  Postoperatively,  appeared to do well with return of function, ability to stand and walk,  however, as time has progressed, he appears to be experiencing back pain  with radiation to his buttock and thighs, both sides.  Follow-up studies  have demonstrated a progressive spondylolisthesis at the L4-5 level.  A  degenerative spondylolisthesis at the L5-S1 level and a central disc  herniation at L3-4.  He has undergone attempts at conservative management  including epidural steroids, physical therapy, bracing, all of which have  been unsuccessful in relieving his pain.  He is brought back to the  operating room to undergo a decompression of the central disc protrusion at  L3-4, right-sided foraminotomy at L5-S1 as well as right L4-5 with  facetectomies on the right side at L4-5 and L5-S1, left-sided facetectomy at  L3-4 with left-sided microdiscectomy L3-4 and TLIF at L3-4,  L4-5 and L5-S1.  Posterolateral fusion would then be carried out with internal fixation from  L3 to sacrum.  Initially with discussion, we had decided to limit his  procedure to the L4 level, however, with review of this patient's myelogram  and it is apparent that he has a spondylolisthesis as well as at L5-S1 with  foraminal entrapment likely to continue to cause him difficulties, also with  disc herniation at the L3-4 level with discectomy in the face of previous  central laminectomy at this segment.  It is felt that fusion would likely  given him the best chances of no further surgery.   INTRAOPERATIVE FINDINGS:  Patient was found to have severe degenerative disc  disease L5-S1 , L4-5 and L3-4.  No disc herniation was found to be present,  however, the patient had significant disc bulge posteriorly due to degree   disc changes with spondylosis spurs off the posterior end plates at the Z6-1  level causing impression on the thecal sac and the left L3-4 lateral recess.  Right-sided foraminal entrapment involving the L5 nerve root, right side  foraminal entrapment involving the L4 nerve root.   DESCRIPTION OF PROCEDURE:  After adequate general anesthesia , patient in a  prone position, chest rolls were used, the arms at the side, well padded.  Standard TED nose and compression stockings were used during the case.  Intraoperative EMG monitoring as well as soft tissue resistance monitoring  during pedicle screw placement.  Standard preoperative antibiotics of Ancef  bolused with a second dose of Ancef after approximately five hours into the  surgery case.  Patient  had a Foley catheter placed after intubation prior  to turning to a prone position.  Again, all pressure points were well  padded.  Hips and thighs were placed into extension to restore lordosis of  the lumbar spine for the surgical case.  Patient was on the [facility] fracture  table with chest rolls.  Standard prep with DuraPrep solution, draped in the  usual manner iodine impregnated Vi-Drape.  Old incision scar extending from  L2 to L5 was infiltrated with Marcaine 0.5% with 1:200,000 epinephrine and  ellipsed using a 10 blade scalpel.  Incision carried up to the L1 level  superiorly and inferiorly to the S1 level.  Incision carried directly down  to the spinous process of L2, of L1, L5, S1 and S2.  Incision then carried  in the midline between the spinous processes of these to a depth equal to  that of the lamina at the L2 level superiorly and L5 inferiorly.  Cobb was  then used to elevate the paralumbar muscles bilaterally out to the levels of  the facets at the L2-3, L3-4, L4-5, L5-S1 levels both sides.  Viper  retractor was used as well as cerebellar.  Laminar recesses were then exposed bilaterally to the tips of the transverse process of  L3, L4, L5 and  sacral ala bilaterally. Bleeders controlled using mono-electrocautery as  well as bipolar electrocautery.  Note that the patient's indwelling cerebral  stimulator used for Parkinson's disease was turned off prior to this  patient's procedure.  With exposure out to the tips of the transverse  process at each of these segment, then attention was turned to performing a  right-sided hemilaminectomy at the L5-S1 level by resection of the L5  lamina.  Using an osteotome to perform near complete facetectomy of the  right L5-S1 facet.  A 3 mm Kerrison used to debride and remove  the lamina on  the right side, continue this up to the lateral recess decompression,  carried out with the central decompression at the L4-5 level previously.  L5  nerve root was identified and carefully decompressed out to the L5 foramen  and out through the foramen removing the reflected portion of the ligamentum  flavum and the right L5-S1 facet.  The S1 nerve root was similarly evaluated  and decompressed within the lateral recess along the right side at the L5-S1  level within its foramen.  Foraminotomy was performed such that a hockey  stick neural probe could be passed out the neural foramen without difficulty  on the right side.  Since patient's pain was primarily right-sided, we did  not perform decompression on the left side at this segment.  Instead,  attention was turned to preparation for TLIF as well as placement of pedicle  screws by exposing the areas for insertion of the screws and the TLIF on the  right side.  The thecal sac and the S1 nerve root carefully teased in the  midline and the epidural veins cauterized using bipolar electrocautery.  The  disc space identified.  Thrombin soaked Gelfoam then placed for hemostasis.  Attention then turned to the L4-5 level where decompression was carried out  using osteotomes to resect medial aspect of the facet at the L4-5 level,  also performed  decompression at the L5 nerve root within the lateral recess  at this segment and out the L5 neural foramen.  Medial facet at the L4-5  level was quite hypertrophic and it was resected using the osteotomes and 3  mm Kerrison.  The superior articular process of L4 was resected on the right  side and the L4 facet on the right side resected.  This to be used for bone  graft later in the procedure.  The L4 nerve root was then decompressed out  the neural foramen and it was felt to be totally free.  Hockey stick neural  probe easily passed out the foramen over and under the nerve root.  That is  ventral and dorsal to the nerve root.  The thecal sac then retracted  medially, freed up off the disc space on the right side at L4-5 and the disc  exposed for later performance of TLIF.  This area was then packed with  thrombin soaked Gelfoam cottonoids.  Attention turned to the right L3-4 level where medial facetectomy was performed to decompress the lateral  recess, the right L3 nerve root decompressed nicely.  Attention then turned  to the left side at the L3-4 level where facetectomy was carried out over  the left L3-4 facet using osteotomes and 3 mm Kerrisons freeing up the  thecal sac off the medial aspect of the facet using a curette.  After  resecting the joint and performing facetectomy in the superior articular  process of L3 and the inferior articular process of L3 and the superior  articular process of L4, then the L4 nerve root and L3 nerve root were felt  to be well decompressed on the left side.  C-arm fluoroscopy was put onto  the field.  Clamps at the L2 and at the L5 level identified these levels  prior to the decompression.  Attention now turned to placement of pedicle  screws and initial pedicle screws placed on the left side at the L3 level  entry point made into the intersection of the transverse process with that  of the superior articular  process of L3 and this awl was then used  to make  an opening into the pedicle laterally.  Pedicle probe then used to probe the  pedicle at an angle of converge just about 15 to 20 degrees.  Observed on C-  arm to be well within the central portion of the pedicle in the AP and the  lateral plane.  This opening was then checked with a ball-tipped probe to  insure patency and no evidence of broach through the cortices.  Depth  measured at 50 mm.  A 6.25 screw was chosen.  This particular hole was then  tapped with a 5.5 tape.  Decortication carried out over the tip of the  transverse process and the posterior aspect of transverse process of L3.  Symphony bone graft material in combination with local bone graft material  was the placed over the left transverse process of L3 and the screw  measuring 50 mm x 6.25 was then placed on the left side and obtained  excellent purchase.  Attention then turned to the L4 level on the left side  where similarly an awl was used to make initial entry point observed on C-  arm fluoroscopy to be in good position alignment.  Using convergence and the  correct degree of lordosis of the insertion of the pedicle finder.  Pedicle  finder was then inserted through the pedicle, measured for depth 45 mm, a  width of 7.0was chosen.  This was tapped with a 6.25 tap and then the 45 mm  screw placed.  Note the decortication of transverse process of L4 was  carried out.  Bone graft then performed in the lateral recess prior to  placement of the screw at the L4 level.  Attention then turned to L5 where  similar to L4 and awl was used to make an entry point into the lateral  aspect and posterior aspect of the superior articular process of L5 and a  handheld pedicle finder then used to probe the pedicle.  Depth measured at  45 mm, width was 7.0, chosen.  Decortication of L5 transverse process  carried out.  Bone grafting performed.  Note that at each of these levels, ball-tipped probe was used to probe the pedicle  channel following the  initial pedicle probing and also after tapping with appropriate tap.  A ball-  tipped probe used to insure patency of each.  This completed, attention then  turned to placement of the screw in the L5 level in the left side, a 45 mm x  7.0 screw placed on the left side without difficulty with proper degree of  convergence and lordosis.  Attention then turned to the left S1 level where  the inferior articular process of L5 and the superior articular process of  S1 were resected.  Decortication of the superior articular process of S1 was  carried out and entry point made into the cancellous portion of the inferior  aspect of the superior articular process of S1 and then an awl used to make  initial entry point.  C-arm fluoroscopy used to ascertain correct position  alignment.  At this point this relation to the pedicle being within the  center portion of the pedicle. A handheld pedicle finder then used to probe  the pedicle.  Measured depth at 45 mm.  This was then checked for patency  using a ball-tipped probe.  A pedicle finder was removed.   Tapping was performed using a 6.25 tap at the  7.0 x 45 screw placed on the  left side at the S1 level .  Attention turned to the right side where  similarly at the L3 level, an awl was used for entry point and using a  pedicle probe, probing the pedicle, checking with ball-tips, tapping with a  5.5 tap, placing a 50 mm x 6.25 screw on the right side at the L3 level.  Attention to the right L4 level where again an awl used for initial entry  point.  Then using a pedicle probe to probe the pedicle, using a ball-tipped  probe to then palpate the pedicle, insuring its patency and no broach in the  cortex, tapping with a 6.25 and placing a 45 mm x 7.0 screw at the L4 level  and the right L5 level.  Again an awl was used for initial  entry point and  then a pedicle finder used to probe the pedicle.  Then tapping a 6.25 tap  after using  the pedicle probe.  Insuring patency of the pedicle both before  and after the use of the tap.  The 45 x 7.0 screw was placed on the right  side at L5.  Attention turned to the right S1 level where again another  screw was placed, this time at the dimple just inferior to the S1 facet  using an awl for initial entry point and a handheld pedicle finder probing  to a depth of 35 mm measuring for depth, testing for patency using ball-  tipped probe, insuring patency of the pedicle and then tapping using a 6.25  tap and placing a 7.0 screw and this was done without difficulty using a 35  mm screw at this level x 7.0.  With this, each of the pedicle screws were  placed.  They were then tested for soft tissue resistance and the soft  tissue resistance monitoring testing was as follows.  Left L3 38, right L3  74, left L4 36, right L4 35, left L5 52, left L5 50, left S1 level 27, right S1 level 33. The patient had rods contoured to fit into the pedicle screws  fasteners.  Each of these were then carefully loosened using the head  breakers and a 95 mm length rod was able to be placed on the left side.  On  the right side, a rod needed to be cut from the length of the rod and  contoured appropriately and then placement in the fasteners.  Attention then  turned to placing a TLIF on the right side at L5-S1 level retracting the  thecal sac and the L5 nerve root.  Disc space then entered  using a 15 blade  scalpel after careful retraction of other neural structures, pituitary  rongeurs used to initially debride the disc, then a dilator placed into the  disc to 8 mm.  Disc then debrided of end plate material using ring curettes  as well as pituitaries.  A 9 mm dilator was also inserted in order to  stretch the annulus to allow for placement of a 7 mm cage.  A 7 mm cage was  chosen after 7 mm trial was able to be inserted.  However, bone graft was  not placed in addition to the cage as it was felt that this  disc space could  not accept further bone graft material.  A distractor was placed between the  screws on the right side at the L5 and S1 levels.  The pedicle screws  for  distraction of the disc space and the disc space distracted.  Osteotomes  were used to debride the posterior end plate and osteophytes and lining for  ease of insertion of the cage.  With distraction across the disc space, then  a 7 mm leopard cage filled with bone graft material was local and quality  was then impacted into place and carefully further impacted using pusher  impactors.  This was subset about 3 to 4 mm anterior to the posterior aspect  of the disc space at L5 and S1.  Attention then turned to the L4-5 level  where similarly the thecal sac and the L4 nerve root were carefully  retracted medially and superiorly. Disc space identified.  Pituitaries and a  15 blade scalpel used to incise the disc and excise the disc posteriorly.  End plate osteophytes resected using osteotomes provided.  Pituitary  rongeurs then used to debride this space of degenerative disc material.  End  plates curetted using green curettes and pituitary rongeurs.  Bone graft  obtained locally was then placed after first sounding with a trial 7 mm cage  which provided excellent fit and dilating the disc space with a 9 mm  dilator.  Local bone graft was then placed and then 7 mm cage inserted and  impacted into place using a distractor between the L4 and L5 pedicle screws  to allow for distraction of disc space with insertion of the cage.  Once the  cage was placed then on the right side at L4-5 attention turned to the left  side at L3-4 where the thecal sac and the left L3 nerve root were carefully  freed up and the thecal sac retracted medially.  The posterior aspect of the  disc space excised on the left side using a 15 blade scalpel and pituitary rongeurs.  Distraction of the L3 and L4 pedicle screws on the left side  distracting the  disc space and disc space then debrided of disc material  using curettage of both ring curettes as well as pituitary rongeurs.  Sounding with a 7 mm sounder distracting the disc space though with both 8  and 9 mm dilators.  This is completed, then local bone graft was placed  within the disc space of the L3-4 level and the cage measuring 7 mm was then  filled with local bone graft material.  This was then inserted and packed  into place and rotated carefully across the midline using kicker impactors.  Both the cage at the L3-4 and L4-5 levels were subset beneath the posterior  aspect disc space at L3-4 and L4-5 at least 3 to 4 mm seen on the radiograph  as well.  With this, then attention was turned to the compression of the rod  and screw construct.  Beginning on the left side at the L3-4 level, then the  most superior fastener of the left L3 pedicle screw was then attached to the  rod and tightened to 80 foot pounds.  The left L4 pedicle fastener was  placed under compression with that of L3 and then the fastener connected to  the rod to 80 foot pounds.  Attention then turned to the right side where  compression was obtained after first torquing the fastener to the rod at the  L3 level on the right side and then compressing between the L3 and L4 on the  right side and fastening the fastener at L4 then to the rod at 80 foot  pounds.  Between L4 and L5 on the right side, then compression was obtained  using the compressor and the L5 fastener was then attached to the rod and  torqued to 80 foot pounds.  Then compressing between L5 and S1 on the right  side, the S1 fastener was then tightened to 80 foot pounds, locking the rod  on the right side completely.  Attention turned to the left side where the  L5 fastener was then placed under compression with L4 and tightened to 80  foot pounds and then the S1 pedicle screw fastener attached to the rod was  placed under compression with the L5 on the  left and then tightened to 80  foot pounds.  Irrigation was performed throughout the case and additional  Ancef was given during the case.  Careful inspection with hockey stick  neural probe demonstrated patency of the left L4 and L3 neural foramen at  the L3-4 level.  The disc space well decompressed posteriorly at this level.  The right L4 neural foramen appeared to be well decompressed as well as  right L3 neural foramen and L5 neural foramen.  The S1 neural foramen on the  right side was also tested for patency and appeared to be widely patent.  A  transverse loading rod then placed at the level of L3 and L4 between these  pedicle screws on each side.  This particular transverse loading rod of  appropriate length was attached.  First the right side rod rotated in and  attached to the left side.  Each of the fasteners were tightened to 100 foot pounds and then the central screw used for lengthening of the rod centrally  between these two fasteners was then tightened to 100 foot pounds.  Additional remaining bone graft was then placed out over the posterolateral  regions both sides to reinforce these areas.  Irrigation was performed.  Gelfoam placed over the laminotomy regions right side and left side.  Medium  Hemovac drain placed in the depth of the incision over the left side,  exiting over the right lower lumbar region.  Paralumbar muscles then  approximated in the midline with interrupted #1 Vicryl sutures loosely.  Lumbodorsal fascia reattached to the supraspinous ligament L1 and L2 and at  L5, at S1 and S2.  Lumbodorsal fascia reapproximated in the midline with  interrupted #1 Vicryl sutures in simple figure-of-eight fashion.  The deep  subcu layers approximated with interrupted #1 and 0 Vicryl sutures and more  superficial layers with interrupted 2-0 Vicryl suture and the skin closed  with a running subcu stitch of 4-0 Vicryl.  Tincture of benzoin and Steri-  Strips applied.  The  4x4s, ABD pads fixed to the skin with Hypafix tape.  Patient then returned to supine position, reactivated, extubated and  returned to the recovery room in satisfactory condition.  In the recovery  room, he had reactivation of his indwelling cerebral stimulator for  Parkinson's disease.   Note that intraoperative permanent images were obtained for C-arm purposes  and documentation both AP and lateral planes demonstrating the  instrumentation and fixation.      Kerrin Champagne, M.D.  Electronically Signed     JEN/MEDQ  D:  01/03/2005  T:  01/04/2005  Job:  [id number]

## 2010-07-26 NOTE — Consult Note (Signed)
[name], [name] NO.:  [id number]   MEDICAL RECORD NO.:  [id number]          PATIENT TYPE:  INP   LOCATION:  2899                         FACILITY:  [facility]   PHYSICIAN:  Lonia Blood, M.D.       DATE OF BIRTH:  Nov 05, 1929   DATE OF CONSULTATION:  01/03/2005  DATE OF DISCHARGE:                                   CONSULTATION   REQUESTING PHYSICIAN:  Dr. Vira Browns.   PRIMARY CARE PHYSICIAN:  Dr. Benedetto Goad.   PRIMARY CARDIOLOGIST:  Dr. Charlies Constable   REASON FOR CONSULTATION:  Parkinson's disease and questionable volume  overload.   HISTORY OF PRESENT ILLNESS:  [name] is a 77 year old man with history of  coronary artery disease status post stent in September of 2005, who had on  December 17, 2004 extensive lumbosacral fusion surgery by Dr. Otelia Sergeant.  We were  called to help with medical management, adjustment of Parkinson's  medications and adjustment of cardiac medications.  During the procedure,  [name] had some significant blood loss, but he received autologous blood  transfusion.  His hematocrit remained stable throughout the procedure, but  he received extensive volume resuscitation throughout the procedure.  Currently, [name] is denying any chest pain, denies any shortness of  breath, he denies any coughing, does have abdominal pain, or any discomfort  for the reason.  He reports mild back pain.   PAST MEDICAL HISTORY:  1.  Hyperlipidemia.  2.  Anemia.  3.  Diverticulosis.  4.  Spinal stenosis.  5.  Nephrolithiasis.  6.  Right knee arthropathy.  7.  Arthroplasty.  8.  Coronary artery disease status post stent in September 2005.  The      patient was on Plavix for one year.  He stopped the Plavix two weeks      prior to the surgery.  9.  Severe Parkinson's disease status post deep brain electrical stimulator      implant.   MEDICATIONS:  1.  Aspirin 325 mg daily.  2.  Lipitor 80 mg daily.  3.  Paxil 20 mg daily.  4.  Mirapex 0.5 mg  by mouth up to four or five times a day.  The patient      takes the medication every three hours.  5.  Sinemet 250/50.  Patient takes half a tablet by mouth every three hours      up to four or five times a day.   SOCIAL HISTORY:  [name] is married.  He is a retired Radio producer.  He has never smoked cigarettes and does not drink alcohol.   FAMILY HISTORY:  His mother passed away with a AAA.  He has a brother with  diabetes and two sisters that are healthy.   REVIEW OF SYSTEMS:  As per HPI.  All other systems are negative.   PHYSICAL EXAMINATION AT TIME OF CONSULTATION:  VITALS:  Temperature 99.2,  blood pressure 148/48, heart rate of 78, respiratory rate of about 12,  saturation of 100% on 2 L of oxygen.  GENERAL APPEARANCE:  Mr.  [name] appears well-developed, well-nourished,  mildly obese, in no acute distress.  He appears initially to be restless  secondary to some back pain, but then with repeat doses of opiates, he  becomes very comfortable.  EYES:  Some palpebral edema secondary to being in prone position for six  hours for the surgical procedure.  His pupils are equal and round and react  to light and accommodation.  Patient is able to open the eyes with  difficulty secondary to the edema.  His extraocular movements is intact.  He  does not have any icterus.  NECK:  Without JVD, no carotid bruits.  RESPIRATORY EXAM:  His chest is clear to auscultation on anterior fields  without wheezes, rhonchi or crackles.  HEART:  Regular rate and rhythm without murmurs, rubs, or gallops.  ABDOMEN:  Soft, nontender, nondistended.  Bowel sounds are present.  There  is no hepatosplenomegaly.  EXTREMITIES:  No edema.  SKIN:  Warm and dry.  NEUROLOGICAL EXAM:  Difficult to perform given the fact that he just  recently had the surgery, but his upper extremities have 5/5 strength.  He  has some rolling slow coarse tremor on the left upper extremity, but that  resolved actually with  better pain control.   LABORATORY VALUES:  PTT of 35, PT 13.5, sodium 142, potassium 4.1, chloride  108, bicarb 26, BUN 14, creatinine 1, glucose 100.  Liver function tests are  within normal limits.  Hemoglobin of 10.8, hematocrit of 31, platelet count  of 288,000 and white blood cell count of 7.9.   IMPRESSION AND RECOMMENDATIONS:  1.  Severe Parkinson's disease.  Agreed to turn on again the deep brain      stimulator after surgery.  Also would recommend immediate resumption of      Mirapex and carbidopa levadopa to help the patient be more arousable      post surgery.  Also, we will closely monitor the progress and titrate      the doses as needed for the patient to be able to collaborate with      physical therapy post surgery.  2.  Coronary artery disease status post stent.  Currently, the patient does      not have any active symptoms for angina or acute myocardial infarction.      Plan is to resume for the moment being aspirin alone as per Dr. Regino Schultze      recommendations.  No Plavix for now, because of the high risk of      bleeding.  Continue with the Lipitor at bedtime.  3.  Deep vein thrombosis prophylaxis.  PAS hose and TED hose as per surgery.  4.  Gastrointestinal prophylaxis with Protonix.  5.  Post op anemia.  For now hematocrit is stable.  Patient to be transfused      as needed to keep a hemoglobin above 10 if possible at all times without      volume overloading him.   Thank you for this consultation.  We will follow along this patient with  you.      Lonia Blood, M.D.  Electronically Signed     SL/MEDQ  D:  01/03/2005  T:  01/04/2005  Job:  [id number]   cc:   Gloriajean Dell. Andrey Campanile, M.D.  Fax: [phone]   Kerrin Champagne, M.D.  Fax: [phone]   Charlies Constable, M.D. [facility]  [address]  [address]  [place]  [place] [address]

## 2010-07-26 NOTE — Discharge Summary (Signed)
NAME:  [name], [name] NO.:  [id number]   MEDICAL RECORD NO.:  [id number]                   PATIENT TYPE:  INP   LOCATION:  6711                                 FACILITY:  [facility]   PHYSICIAN:  Kerrin Champagne, M.D.                DATE OF BIRTH:  [birth year]   DATE OF ADMISSION:  08/10/2003  DATE OF DISCHARGE:  08/15/2003                                 DISCHARGE SUMMARY   ADMISSION DIAGNOSES:  1. Central and biforaminal stenosis at L3-4 and L4-5, foraminal stenosis     bilateral L5-S1.  2. Parkinson's disease.  3. Recent sinusitis.  4. Tuberculosis as a child.  5. History of kidney stones.  6. Right total knee arthroplasty.  7. Implanted deep brain stimulator for Parkinson's treatment.  8. Allergies to morphine and nitroglycerin.   DISCHARGE DIAGNOSES:  1. Central and biforaminal stenosis at L3-4 and L4-5, foraminal stenosis     bilateral L5-S1.  2. Parkinson's disease.  3. Recent sinusitis.  4. Tuberculosis as a child.  5. History of kidney stones.  6. Right total knee arthroplasty.  7. Implanted deep brain stimulator for Parkinson's treatment.  8. Allergies to morphine and nitroglycerin.  9. Postoperative ileus, resolved at discharge, requiring gastrointestinal     consultation.  10.      Mild posthemorrhagic anemia.  11.      Diffuse rash of his back, nonraised, treated with hydrocortisone     cream.   PROCEDURE:  On August 10, 2003, the patient underwent central laminectomy L3-4  and L4-5, bilateral recess decompression at both of these segments as well  as at L2-3, decompression of bilateral L3, bilateral L4, and bilateral L5  nerve roots, performed by Dr. Otelia Sergeant and assisted by Wende Neighbors, P.A.  under general anesthesia.   CONSULTATIONS:  [facility].   HISTORY OF PRESENT ILLNESS:  The patient is a 77 year old male with a  history of Parkinson's disease.  He has developed progressive neurogenic  claudication worse than expected with  Parkinson's disease alone.  The  patient has significant pain with standing and prolonged walking.  Myelogram  has demonstrated severe lumbar spinal stenosis at both L3-4 and L4-5.  Foraminal entrapment at the right side greater than the left at the L4-5 and  the L3-4 levels as well as bilateral foraminal stenosis at L5-S1 was also  identified.  Due to the patient's considerable debilitation secondary to  these findings, it was felt that he would require surgical intervention and  was admitted for the procedure as stated above.   HOSPITAL COURSE:  The patient tolerated the procedure under general  anesthesia without complications.  Postoperatively, he did have considerable  cramping in his lower extremities, but this was somewhat of a usual finding  secondary to his Parkinson's disease.  He was treated with PCA analgesics as  well as IV muscle relaxers.  He had excellent  strength and sensation in the  lower extremities bilaterally postoperatively.  On his first postoperative  day, initially he had a soft abdomen with normal bowel sounds and it was  nontender.  Throughout the day he developed progressive bloating and  belching.  A KUB did demonstrate probable ileus.  As he had seen Dr. Juanda Chance  in the past at [facility], they were asked to assist with his care during  the hospital stay.  NG tube was placed, however, he had low output.  This  was discontinued and Zelnorm was added to his medication.  He was NPO until  having appropriate flatus.  As his PCA Dilaudid was discontinued and he was  placed on oral analgesics, this assisted with his resolving of ileus as  well.  Eventually with the use of suppositories as well as oral laxatives,  IV Reglan and IV Protonix, he was able to clear the ileus and he was resumed  on a regular diet at that time.  The patient had mild urinary retention  postoperatively and required extended use of his Foley catheter.  Eventually, this was discontinued and  the patient's urinalysis was negative  for urinary tract infection.  He was able to void without difficulty after  the second removal of his catheter.  The patient was slow to progress with  ambulation, however, eventually was more stable from a medical standpoint  and was able to be more independent with activities.  Prior to discharge, he  was ambulating in the hallway independently.  He was also out of bed to his  chair independently.  Occupational therapy assisted with his ADL's and he  tolerated this without problems.  A rash was noted on his low back.  The  patient was allowed to shower in the hospital and Benadryl cream as well as  hydrocortisone cream were used on an as-needed basis.  As the patient had  significant difficulty with cramping and mobility secondary to Parkinson's  and also postoperative pain, a hospital bed was felt to be appropriate for  him.  As this was delivered on August 15, 2003, all arrangements were made for  his home return.  At that time, he was also medically stable and also  orthopedically stable for discharge.  At the time of discharge, on August 15, 2003, he was afebrile with vital signs stable.  He did continue to have some  tightness and spasm of the buttocks and the lower back, however, it was more  tolerable.  No numbness or tingling in the lower extremities.  Pain was  controlled with oral analgesics and neurovascular motor function in the  lower extremities was intact.  His wound was healing without erythema,  edema, or drainage and his abdomen was soft and nontender with bowel sounds.  The patient was taking oral diet and was having adequate bowel movement and  urinating well.   CONDITION ON DISCHARGE:  Stable.   LABORATORY DATA:  EKG on admission; sinus bradycardia, nonspecific ST and T  wave abnormality with no old tracings for comparison, confirmed by Ricki Rodriguez, M.D.   Chest x-ray on admission; prominent perihilar bronchovascular  markings, probably chronic.   On June 3, KUB showed no acute abdominal abnormalities, nasogastric tube in  the stomach with its tip at the antrum.   CBC on admission; WBC 11.6, hemoglobin 14.1, hematocrit 41.7.  Repeat on  June 3 with WBC 17.3, hemoglobin 14.3, and hematocrit 42.8.  At discharge,  CBC was 16.1  WBC, hemoglobin and hematocrit 13.6 and 39.8 respectively.  Coagulation study on admission was within normal limits.  Chemistry studies  on admission with glucose 102, BUN 24.  Prior to discharge, chemistry  studies were within normal limits with the exception of glucose 101.  The  patient had one episode of mild hyponatremia that resolved.  Urinalysis on  admission was positive for protein and calcium oxalate crystals noted.   PLAN:  The patient was discharged to his home. Arrangements were made for  home health physical therapy as well as occupational therapy and a hospital  bed.  He was given restrictions for no lifting greater than 5 to 10 pounds.  He will have no bending or twisting as well.  He will use ice to his back.   DISCHARGE MEDICATIONS:  1. Robaxin as needed for spasm.  2. Percocet for pain.  3. He will resume all of his other home medications and use stool softeners     daily.   ACTIVITY:  He will ambulate as tolerated with a walker.  Dressing changes  will be done daily and he will be allowed to shower at home.   FOLLOW UP:  He will follow up with Dr. Otelia Sergeant two weeks from the date of his  surgery.  They will use over-the-counter hydrocortisone cream for his rash.  Should he continue to have difficulty with this, he has been instructed to  follow up with his primary care physician.  All questions were encouraged  and answered and the patient was advised to call Dr. Barbaraann Faster office if  questions or concerns arise prior to his return office visit.      Wende Neighbors, P.A.                    Kerrin Champagne, M.D.    SMV/MEDQ  D:  09/28/2003  T:  09/29/2003   Job:  [id number]

## 2010-07-26 NOTE — Discharge Summary (Signed)
[name], [name] NO.:  [id number]   MEDICAL RECORD NO.:  [id number]          PATIENT TYPE:  INP   LOCATION:  3729                         FACILITY:  [facility]   PHYSICIAN:  Mallory Shirk, MD     DATE OF BIRTH:  25-Feb-1930   DATE OF ADMISSION:  06/11/2004  DATE OF DISCHARGE:  06/14/2004                                 DISCHARGE SUMMARY   DISCHARGE DIAGNOSES:  1.  Chest pain with shortness of breath.  2.  Anemia.  3.  Parkinson's disease.  4.  Low back pain.   DISCHARGE MEDICATIONS:  1.  Plavix 75 mg p.o. q.d.  2.  Aspirin 325 mg p.o. q.d.  3.  Lipitor 80 mg p.o. q.d.  4.  Mirapex 0.5 mg q.3h. p.o.  5.  Paxil 20 mg p.o. q.d.  6.  Carbidopa/levodopa 25/250 1/2 tablet p.o. q.3h.   Please note there have been no changes in the outpatient medication regimen.   FOLLOW UP:  Follow-up appointment with Dr. Benedetto Goad in 5-7 days.  The  patient will call Dr. Tawana Scale office at [phone], to set up an appointment.   HISTORY OF PRESENT ILLNESS:  [name] is a very pleasant, 77 year old,  Caucasian gentleman with a history of coronary artery disease, status post  PTCA and stent, Parkinson's disease and chronic back pain who presented to  the emergency department at [facility] on June 11, 2004, with chest pain.  He has had a cold since March 29.  He went to see his PMD on June 08, 2004,  and was prescribed Augmentin which he has been taking prior to admission.  In the a.m. of June 11, 2004, he suddenly became short of breath and  developed chest pressure with no radiation that lasted about 15 minutes.  Since then, he has had recurrent episodes of chest pain.  He called EMS and  was brought to the emergency department.  No nausea associated with these  episodes.  No history of prolonged immobilization.   PAST MEDICAL HISTORY:  1.  Coronary artery disease, status post PTCA and stent in September 2005.  2.  Parkinson's disease, status post deep brain stimulation  implant.  3.  Chronic back pain secondary to DJD.  4.  Lumbar spinal stenosis, status post laminectomy in June 2005.  5.  Diverticulosis.  6.  Urolithiasis.  7.  Status post right knee arthroplasty.   MEDICATIONS:  1.  Plavix 75 mg p.o. q.d.  2.  Aspirin 325 mg p.o. q.d.  3.  Lipitor 80 mg p.o. q.h.s.  4.  Mirapex 0.05 mg p.o. q.3h.  5.  Paxil 20 mg p.o. q.d.  6.  Carbidopa/levadopa 25/250 1/2 tablet p.o. q.3h.   ALLERGIES:  NITROGLYCERIN and MORPHINE.  At one time he took he these, he  had a cardiac arrest.   PHYSICAL EXAMINATION:  VITAL SIGNS:  Blood pressure 114/46, pulse 65,  temperature 97.8, respirations 18, saturations 100% on room air.  GENERAL:  Elderly, Caucasian gentleman, not short of breath at rest.  Alert  and oriented x3.  Communicative.  HEENT:  Normocephalic, atraumatic.  PERRLA.  Sclerae anicteric.  Mucous  membranes moist.  Oropharynx nonerythematous.  NECK:  Supple.  No JVD.  No LAD.  No palpable goiter.  CARDIAC:  S1, S2, regular rate and rhythm with no murmurs, rubs or gallops.  LUNGS:  Clear to auscultation bilaterally with no wheezes or rales.  ABDOMEN:  Soft, positive bowel sounds.  No tenderness or masses.  Brain  stimulator battery pack in the left upper quadrant pouch.  EXTREMITIES:  Bilateral venous stasis changes, but no edema.   LABORATORY DATA AND X-RAY FINDINGS:  WBC 7.6, hemoglobin 6.9, hematocrit  20.1, platelets 205, MCV 89.2.  Electrolytes with sodium 139, potassium 3.5,  chloride 109, carbon dioxide 23, BUN 27, creatinine 1.7, glucose 104.  ALT  less than 9, AST 19, Alk phos 64.  Point of care troponins less than 0.05  x3.   EKG shows normal sinus rhythm with no acute ischemic changes.  Chest x-ray  shows no acute abnormalities.  CT of chest with negative for PE.  No  definite acute thoracic aorta abnormality noted.  No evidence for  dissection.   HOSPITAL COURSE:  Problem 1.  CHEST PAIN:  The patient was admitted on  monitored bed.   Three sets of cardiac enzymes were negative.  The patient's  chest pain resolved during the hospital stay.  A 2D echocardiogram showed an  overall normal left ventricular systolic function with LVEF estimated  between 55-65%.  There were no LV regional wall motion abnormalities, mild  mitral valve regurgitation noted.  Left atrium was mildly dilated with  estimated peak pulmonary artery systolic pressure moderately increased.   CT angiography of the chest showed no PE and Dopplers of the lower  extremities showed no evidence of DVT.  The patient was continued on his  Plavix and aspirin.  These medications will be continued at discharge.   Problem 2.  ANEMIA:  The patient's H&H upon arrival was 6.9/20.1.  He was  given 2 units of PRBCs upon which his H&H was 9.0/25.8, but he had a  subsequent drop to 8.5/24.5.  He was noted to be Hemoccult positive.  A GI  consult was called.  The patient underwent an upper endoscopy on June 14, 2004, where acute gastric ulcer without hemorrhage was noted.  Biopsies have  been taken for Helicobacter pylori.  If positive, the patient will be  notified and treated appropriately.  No further GI workup is planned at this  time.  On day of discharge, the patient's hemoglobin and hematocrit was  10.2/29.1.  Dr. Andrey Campanile, his primary care physician, is aware of these  findings and he will monitor the patient closely for his anemia.  Iron  studies showed a mixed picture of iron deficiency and anemia of chronic  disease with iron 28 mcg/dl and TIBC at 161.  Ferritin was at 54.  The  patient will be discharged on iron sulfate 325 mg p.o. t.i.d.   Problem 3.  PARKINSON'S DISEASE:  The patient's Mirapex and  carbidopa/levadopa was resumed at home doses.  His Mirapex was also resumed  for his leg pain secondary to his Parkinson's disease.  His pain was managed  with Darvocet.  Problem 4.  HYPOKALEMIA:  The patient had an episode of hypokalemia which  was corrected by  replacement with p.o. potassium.  On the day of discharge,  his potassium was 3.7.   CONDITION ON DISCHARGE:  Stable.   DISCHARGE PHYSICAL EXAMINATION:  VITAL  SIGNS:  Blood pressure 128/60, pulse  55, respirations 20, temperature 97.6, with O2 saturations 96% on room air.  GENERAL:  The patient was ambulatory without assistance.  He will be taken  home by his wife.   SPECIAL INSTRUCTIONS:  The patient was advised to take his medications as  prescribed.  Keep follow-up appointments and return to the emergency  department immediately upon onset of chest pain, shortness of breath or any  other symptom that might need medical attention.      GDK/MEDQ  D:  06/14/2004  T:  06/15/2004  Job:  [id number]   cc:   Gloriajean Dell. Andrey Campanile, M.D.  [address]  [place]  [place] [address]  Fax: [phone]   Charlies Constable, M.D. Redgie Grayer, M.D.  Neurology

## 2010-07-26 NOTE — H&P (Signed)
[name], [name] NO.:  [id number]   MEDICAL RECORD NO.:  [id number]          PATIENT TYPE:  INP   LOCATION:  0103                         FACILITY:  [facility]   PHYSICIAN:  Mobolaji B. Bakare, M.D.DATE OF BIRTH:  1929/07/07   DATE OF ADMISSION:  09/27/2005  DATE OF DISCHARGE:                                HISTORY & PHYSICAL   CHIEF COMPLAINT:  Left inguinal pain started about 1-1/2 weeks ago.   HISTORY OF PRESENTING COMPLAINT:  [name] is a 77 year old Caucasian male  with history of Parkinson's disease and multiple falls.  He has not fallen  lately and he has fallen multiple times.  He started experiencing left groin  pain which started 1-1/2 weeks ago and this pain has been getting worse.  It  is not relieved by Ultram.  He went to see Dr. Andrey Campanile today and was referred  to the emergency room for further evaluation.  He has had a CT scan of the  abdomen and pelvis done which showed bilateral inguinal and pelvic  adenopathy.  As per my discussion with the radiologist the pelvic adenopathy  is 2.5 x 1.5-cm and the bilateral inguinal adenopathy more on the right than  on the left.  The left groin is approximately 1-cm and multiple  lymphadenopathies on the right inguinal region.  There is no abnormal  inflammatory process noted.  He has a left common iliac and there is marked  dilatation 1.8.  ER physician has contacted vascular surgeon who indicates  that the iliac artery and everything is not clinically significant at this  point.   The patient denies fever.  There is no vomiting.  There is no abdominal  pain.  He has history of constipation secondary to medication.   REVIEW OF SYSTEMS:  Denies chest pain, shortness of breath; headaches,  anorexia.  The patient has been having multiple falls lately.   PAST MEDICAL HISTORY:  1.  Parkinson's disease.  He is status post deep brain stimulator      implantation.  2.  Chronic back pain secondary to  degenerative disk disease.  3.  Spinal stenosis. He had laminectomy in 2005.  4.  History of urolithiasis.  5.  Coronary artery disease, status post PTCA.  6.  History of TB as a child.  7.  Gastric ulcer with bleed in April 2006.  8.  Positive PPD.  9.  Stasis dermatitis, both legs.   PAST SURGICAL HISTORY:  1.  Right total knee arthroplasty.  2.  The patient has had spinal laminectomy with fusion involving L4-L5, L5-      S1.  3.  Paternal surgery.  4.  Cataract extraction in 2005.   CURRENT MEDICATIONS:  Include:  1.  Furosemide 20 mg daily.  2.  Potassium supplement 20 mEq daily.  3.  Paxil 20 mg daily.  4.  Lipitor 80 mg daily.  5.  Sinemet 5/250 mg 1/2 tablet q.3h.  6.  Mirapex 0.05 mg 1/2 tablet q.3h.   ALLERGIES:  1.  MORPHINE.  2.  SUBL INGUINAL NITROGLYCERINE.  3.  SEPTRA.  SEPTRA CAUSES RASH.   FAMILY HISTORY:  Noncontributory.   SOCIAL HISTORY:  The patient lives with his wife.  He has one supportive  daughter.   PHYSICAL EXAMINATION:  INITIAL VITALS:  Temperature 98.8, blood pressure  144/70, pulse 69, respiratory rate 20, and O2 sats of 95% on room air.  GENERAL:  The patient is awake, alert, and not in respiratory distress.  Able to communicate.  He has multiple abnormal body movements.  HEENT:  Normocephalic.  LUNGS:  Clear and equal to auscultation.  CVS:  S1, S2 regular.  No murmur.  No gallop.  ABDOMEN:  Obese, soft.  Umbilical hernia.  No palpable organomegaly.  There  is no abdominal tenderness.  No holosystolic murmur heard.  Bowel sounds  present.  RIGHT GROIN:  There are multiple inguinal adenopathies, about 2 x 2.5-cm, on  the medial aspect of left thigh.  These are not tender.  LEFT GROIN:  There is point tenderness over the insertion of muscle  (probably gracilis) muscle.  There is no palpable adenopathy on the left.  He has no positive cough impulse.  EXTREMITIES:  Stasis dermatitis on right lower extremity with thickening of  the skin.   Dorsalis pedis pulse 1+ trace on the right and left lower  extremity trace pedal edema with varicose veins.  Dorsalis pedis pulse 1+.  CNS:  No focal neurological deficit.  The patient has tremor at rest and  abnormal body movements.   INITIAL LABORATORY DATA:  White cell count 7.8, hemoglobin 11.1, hematocrit  33.6, MCV of 89.6, platelets 223, lymphocytes 16%, neutrophils 65%.  Sodium  128, potassium 4.3, chloride 105, bicarb 27.  Glucose 97, BUN of 24,  creatinine 1.1.  Calcium 9.  Lipase 23.  Total protein 7.2, albumin 3.6, AST  of 20, ALT less than 8, alkaline phosphatase 77, bilirubin 0.8, direct 0.1,  indirect 0.7.  Urinalysis:  Urine clear in appearance with specific gravity  1.028; negative for nitrites and leukocyte esterase.   CT scan abdomen and pelvis as noted in the HPI.  Full reports to follow.   ASSESSMENT AND PLAN:  [name] is a 77 year old Caucasian male with  history of Parkinson's disease.  He has not fallen lately.  He is now  presenting with left inguinal pain.  CT scan is remarkable for bilateral  inguinal adenopathy and pelvic adenopathy, however, point of tenderness is  not at the site of inguinal adenopathy.  This appears to be some form of  tendinitis.  There is no inflammatory process noted on CT scan abdomen and  pelvis with contrast.  1.  Left inguinal pain, probable tendinitis, secondary to trauma, probable      adenitis.  Will relieve pain with Dilaudid 0.5 to 1 mg IV q.4h. for      severe pain, Percocet 5/325 mg one to two p.o. q.4h. p.r.n., for mild-to-      moderate pain.  Will follow clinical course.  2.  Bilateral inguinal adenopathy and pelvic adenopathy.  Rule out lymphoma.      Will request fine needle aspiration biopsy to be done by interventional      radiologist on Monday.  Of note, is that the patient core venous stasis      on the right.  Lymphadenopathy may be reactive. 3.  Parkinson's disease.  Will resume Sinemet and Mirapex.  4.   Normocytic anemia.  Will check anemia panel and do a hemoccult.  5.  History of falls. Will  insure fall precaution while in the hospital.  He      will be seen by physical and occupational therapist.  6.  Will resume home medications.  7.  Azotemia.  This is probably secondary to Lasix.  The patient is      clinically dehydrated.  He does not have history of congestive heart      failure.  Will hold Lasix for now and hydrate with 500 mL of normal      saline and reassess.      Mobolaji B. Corky Downs, M.D.  Electronically Signed     MBB/MEDQ  D:  09/27/2005  T:  09/27/2005  Job:  [id number]

## 2010-07-26 NOTE — Discharge Summary (Signed)
NAMELABARRON, DURNIN NO.:  [id number]   MEDICAL RECORD NO.:  [id number]          PATIENT TYPE:  OIB   LOCATION:  6524                         FACILITY:  [facility]   PHYSICIAN:  Charlies Constable, M.D. [facility] DATE OF BIRTH:  April 13, 1929   DATE OF ADMISSION:  11/30/2003  DATE OF DISCHARGE:  12/02/2003                                 DISCHARGE SUMMARY   DISCHARGE DIAGNOSES:  1.  Coronary artery disease.      1.  Status post Taxus stenting to the left anterior descending and          angioplasty to the first diagonal.      2.  Status post peri-procedural myocardial infarction with peak CK-MB          11.5.  2.  Good left ventricular function.  3.  Parkinson's disease.      1.  Status post deep brain stimulator implantation.  4.  Chronic back pain.  5.  Family history of coronary artery disease.   PROCEDURES PERFORMED:  Cardiac catheterization, percutaneous coronary  intervention by Dr. Charlies Constable, on November 30, 2003.  Please see his  dictated note for complete details.  LAD 90% proximal, 95% first-diagonal  circumflex, totaled OM1, 70% OM2.  RCA ectatic.  LV inferoapical  hypokinesis, EF of 60%.  Status post PTCA of the first diagonal reducing the  stenosis from 95% to 30%, status post Taxus stenting to the LAD, reducing  the stenosis from 90% to 0%.   HOSPITAL COURSE:  Please see office note by Dr. Juanda Chance from November 28, 2003, for complete details.  Briefly, this 77 year old male with a history  of Parkinson's disease presented to the office upon the referral of Dr.  Evelena Peat.  He had complaints of exertional chest tightness and  shortness of breath characteristic of recent onset angina.  He was set up  for outpatient cardiac catheterization.  This was done on September 22.  The  results are noted above.  Post-procedure, the patient had an insignificant  drop in his platelets.  Initially, they were 166,000, and they dropped to  142,000.  He had an  elevation in his CK-MBs up to 11.5.  This was two times  normal, and it was felt the patient should be watched overnight.   On the morning of December 02, 2003, he was in stable condition.  His CK-MB  was back to 2.8.  His right groin was without hematoma or bruits, but  positive for ecchymosis.  He had a mild temperature elevation at 100.2.  On  rounds, his temperature was back to 98.6.  He denied any purulent cough,  dysuria, rashes or other symptoms or signs of infection.  Therefore, it was  felt the patient was stable enough for discharge to home.   He would be discharged to home on his usual medications.  He has been asked  to hold his etodolac if at all possible.  His new medications will include  Plavix 75 mg daily, aspirin 325 mg daily, nitroglycerin p.r.n., Lipitor 80  mg q.h.s.   LABORATORY  DATA:  White count 6600, hemoglobin 12.8, hematocrit 36.8,  platelet count 142,000.  Admission INR is 0.9.  Sodium 137, potassium 3.7,  chloride 106, CO2 28, glucose 108, BUN 15, creatinine 1, calcium 8.4.  Lipid  panel pending.  CK-MB as noted above.   DISCHARGE MEDICATIONS:  1.  Plavix 75 mg daily.  2.  Aspirin 325 mg daily.  3.  Nitroglycerin p.r.n. chest pain.  4.  Lipitor 80 mg q.h.s.  5.  Mirapex 1 mg one-half tablet daily.  6.  Darvocet p.r.n.  7.  Paroxetine hydrochloride 20 mg daily.  8.  Carbidopa/levodopa 25/100 mg q.3h.   The patient has been asked to hold his etodolac 500 mg daily.   PAIN MANAGEMENT:  Tylenol as needed and nitroglycerin as needed for chest  pain.  He is to call the office or 911 for recurrent chest pain.   ACTIVITY:  No driving for two days, no strenuous activity for one week.   DIET:  Low fat, low sodium.   WOUND CARE:  The patient is to call our office in [facility] for any groin  swelling, bleeding or bruising.   FOLLOWUP:  Followup will be with Dr. Juanda Chance on October 10 at 3:45, and the  patient should follow up with Dr. Caryl Never as  needed.       SW/MEDQ  D:  12/02/2003  T:  12/02/2003  Job:  [id number]   cc:   Evelena Peat, M.D.  [address]  [place]  [place] [address]  Fax: [phone]

## 2010-07-26 NOTE — Discharge Summary (Signed)
NAMEHEAVEN, WANDELL NO.:  [id number]   MEDICAL RECORD NO.:  [id number]          PATIENT TYPE:  INP   LOCATION:  2899                         FACILITY:  [facility]   PHYSICIAN:  Danae Orleans. Venetia Maxon, M.D.  DATE OF BIRTH:  07/24/1929   DATE OF ADMISSION:  07/25/2005  DATE OF DISCHARGE:  07/25/2005                                 DISCHARGE SUMMARY   REASON FOR ADMISSION:  The patient has a deep brain stimulator for  Parkinson's with a failed battery.  Additionally, he has a depleted battery  and he also has coronary arthrosclerosis and status post prior TCEA.  The  patient was brought to the hospital on Jul 25, 2005 for bilateral deep brain  stimulator battery depletion and he had both of these changed with new  Soletra deep brain stimulator batteries placed for his deep brain  stimulators for parkinsonism.  He did well with this procedure and was  discharged home immediately following the procedure with instructions to  follow-up in my office in two weeks.      Danae Orleans. Venetia Maxon, M.D.  Electronically Signed     JDS/MEDQ  D:  10/30/2005  T:  10/31/2005  Job:  [id number]

## 2010-07-26 NOTE — Cardiovascular Report (Signed)
NAME:  [name], [name] NO.:  [id number]   MEDICAL RECORD NO.:  [id number]          PATIENT TYPE:  OIB   LOCATION:  6524                         FACILITY:  [facility]   PHYSICIAN:  Charlies Constable, M.D. 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  He also has Parkinson's disease  and has a brain stimulator which has been implanted in [place].  We  made the decision to bring him in for evaluation with angiography.   PROCEDURE:  The procedure was performed by the right femoral artery using an  arterial sheath and 6-French preformed coronary catheters.  A front-wall  arterial punch was performed, and Omnipaque contrast was used.  After  completion of the diagnostic study, we made the decision to proceed with  intervention on the bifurcation lesion in the LAD and diagonal branch.   The patient was given weight-adjusted heparin and promptly had ACT greater  than 200 seconds, and was given a double bolus of Integrilin infusion.  He  was given Plavix 300 mg at the end of the procedure.   We used a Guidant JCL4 8-French guiding catheter for the [facility] device.  We  initially passed a Luque wire down the LAD and diagonal branch. We dilated  them first because we were concerned that he would develop ischemia in one  or both vessels when we were performing DCA on the diagonal branch.  We  dilated the diagonal branch with a 2.5 x20 mm Maverick, performing two  inflations up to 10 PSI for 30 seconds.  We then dilated the LAD with same  balloon with two inflations up to 10 PSI for 30 seconds.  This compromised  the diagonal branch, so we dilated once again in the diagonal branch with  the same balloon  up to 8 PSI for 30 seconds.  We then passed the long  Guidant wire used with DCA down the diagonal branch. We then passed the 3.0  - 3.4 cutter into the lesion. We performed multiple circumferential cuts in  the ostial lesion of the diagonal branch with PSIs up to 10 on the first  run.  We then removed the entire device and wire, but did not obtain any  visible atheroma.  We went back in with the same wire, and the same cutting  device and performed multiple circumferential cuts up to 30 PSI.  We then  removed the device and the wire, and obtained moderately large amount of  atheromata.  We went back in a third time with the wire and the cutting  device and performed multiple circumferential cuts up to 40 PSI.  The device  and wire were then removed again.  Repeat diagnostic studies were performed  through a guiding catheter.  This gave a nice result with less than 20%  narrowing at the  DCA site. We then deployed a 3.0 x24 mm Taxus stent across  the diagonal branch.  We deployed this with one inflation of 15 atmospheres  for 30 seconds.  We then post-dilated with a 3.25 x12 mm Quantum Mavik,  performing two inflations up to 15 atmospheres for 30 seconds.  This  compromised the diagonal branch, so we went back in with a 3.0 x12 Maverick  and performed one inflation up to 8 atmospheres for 30 seconds.  We then  post-dilated within the stent again in the LAD with the 3.25 by Quantum  [facility] performing one inflation up to 10 atmospheres for 30 seconds.  Repeat  diagnostic study was then performed through the guiding catheter.  It was a  long, difficult procedure, and the patient had quite a bit of difficulty  with back pain.  He tolerated the procedure well.  We were able to get an  EKG despite the fact that he had the brain stimulator on.  It caused  artifact on our monitored ECG, but we had a separate ECG with less artifact.   RESULTS:  1.  LEFT MAIN CORONARY ARTERY:  The left main coronary  artery was free of      significant disease.  2.  LEFT ANTERIOR DESCENDING ARTERY:  The left anterior descending artery      was a large vessel that wrapped around the apex and gave rise to a very-      large first diagonal branch.  There was a 95% ostial stenosis in the      first diagonal branch.  There was 90% stenosis in the proximal LAD which      began at the diagonal branch and extended over about 15 mm.  The LAD      then gave rise to a large septal perforator and a second diagonal      branch.  3.  CIRCUMFLEX ARTERY:  The circumflex artery gave rise to a first marginal      branch which was completely occluded and filled by collaterals.  A      second marginal branch had a 70% stenosis in an AV branch.  4.  RIGHT CORONARY ARTERY:  The right coronary artery is a moderately large      vessel that gave rise to a posterior descending branch and three      posterolateral branches.  This vessel was ectatic, but there as no major      obstruction.  5.  LEFT VENTRICULOGRAM:  The left ventriculogram was performed in the RAO      projection and showed good wall motion with slight hypokinesis of the      inferoapical segment.  The estimated ejection fraction was 60%.   Following DCA and PTCA of the diagonal branch, the LAD stenosis improved  from 95% to 30%. (bifurcation lesion).   Following stenting of the lesion in the LAD, the stenosis improved from 90%  to 0% (bifurcation lesion).   The aortic pressure was 123/89 with a mean of 105.  The left ventricular  pressure was 123/17.   CONCLUSION:  1.  Coronary artery disease with 90% narrowing in the proximal left anterior      descending artery, 95% stenosis in the first diagonal branch      (bifurcation lesion), total occlusion of a first small marginal branch      of the circumflex artery, 70% stenosis in the second marginal branch of      the circumflex  artery, ectasia of the right coronary artery, and     inferoapical wall  hypokinesis with an estimated ejection fraction of      60%.  2.  Successful treatment of the bifurcation lesion in the proximal LAD and      diagonal branch with improvement in the LAD stenosis from 90% to 0% with      a Taxus drug-eluting stent and improvement in the diagonal branch from      95% to 30% with DCA and PTCA.   DISPOSITION:  The patient was returned for further observation.   ADDENDUM:  The procedure was long and difficult, and the total procedure  took a total of 2 hours and 25 minutes.        ___________________________________________  Charlies Constable, M.D. [facility]    BB/MEDQ  D:  11/30/2003  T:  12/01/2003  Job:  [id number]   cc:   CARDIOPULMONARY LAB   Evelena Peat, M.D.  [address]  [place]  [place] [address]  Fax: [phone]

## 2010-07-26 NOTE — Consult Note (Signed)
[name], WELDY NO.:  [id number]   MEDICAL RECORD NO.:  [id number]          PATIENT TYPE:  INP   LOCATION:  3107                         FACILITY:  [facility]   PHYSICIAN:  Leonard Downing. Pernell Dupre, M.D.  DATE OF BIRTH:  [birth year]   DATE OF CONSULTATION:  01/04/2005  DATE OF DISCHARGE:                                   CONSULTATION   REASON FOR CONSULTATION:  Positive troponin, status post L5 spinal fusion.  This is a patient of Dr. Charlies Constable.   HISTORY OF PRESENT ILLNESS:  This is a 77 year old male with a history of  coronary artery disease, status post stent in 2005 to an LAD and diagonal  lesion, and recent L5 spinal fusion surgery one day ago. Post surgery the  patient was noted to be diaphoretic and cardiac enzymes were cycled. He was  deemed remarkable for a CK of 4721, MB of 143, and troponin elevated at  9.86.  Therefore, [facility] was consulted. Per the nurse as well as  his wife he denies any chest pain. His only pain is back pain and shaking  with Parkinson disease. He denies shortness of breath, PND, orthopnea, lower  extremity edema, or syncope. He denies any other complaints.   PAST MEDICAL HISTORY:  1.  CAD, status post stent in September 2005. He had a periprocedure MI. His      cath on November 30, 2003, was remarkable for a left main with no      significant disease LAD, 95% D-1, and 90%, proximal LAD. The circumflex      OM-1 which was occluded and filled by collaterals. The OM-2 was 70%. The      RCA was ectatic. He had an left ventriculogram which showed slight      inferior hypokinesis with an EF of 60%. He was treated with a Taxus both      to his LAD and diagonal lesions.  2.  Hyperlipidemia.  3.  Anemia.  4.  Parkinson disease followed by neurology.  5.  Diverticulosis.  6.  Spinal stenosis.  7.  Right knee arthroplasty.  8.  Nephrolithiasis.   SOCIAL HISTORY:  He lives with his wife. No tobacco, alcohol, or IV drugs.   FAMILY HISTORY:  Noncontributory.   MEDICATIONS:  In-house include multiple pain medications including a PCA  pump, aspirin 81 mg daily, atorvastatin 80 mg q.h.s., carbidopa, levodopa,  and Protonix.   PHYSICAL EXAMINATION:  GENERAL: Alert, lethargic from his pain medications,  diaphoretic, no increased JVP, no thyromegaly, no carotid bruits.  VITAL SIGNS: Blood pressure 160 over 60s to 70s, pulse 86, respiratory rate  19.  CHEST: Clear to auscultation anteriorly.  CARDIOVASCULAR: Normal S1, split S2 physiologic. No murmurs, rubs, or  gallops.  ABDOMEN: Soft and nontender. Umbilical hernia. Positive bowel sounds. No  hepatojugular reflux.  EXTREMITIES: No clubbing, cyanosis, or edema. SCDs are in place.  Strong  pulses.   His EKG was sinus at 83, PR 132, QRS 84, QTc of 430.   His labs on October 25th show white count of 7.9, platelet  count 288,000. On  October 28th, his H&H was 11/33. His chemistries revealed sodium of 135,  potassium 4.2, BUN 17, creatinine 1.1, glucose 240, normal coags. His CK was  elevated at 4724, MB of 143, troponin 9.86. He has normal LFTs. UA is  negative.   ASSESSMENT:  A 77 year old gentleman with history of coronary artery  disease, status post stent in 2005, recent L5 fusion, consulted because of  positive troponin status post surgery.   RECOMMENDATIONS:  In regards to his positive troponin, the patient has a  history of coronary artery disease and positive troponin. Continue to cycle  his enzymes. Check a transthoracic echo to evaluate both LV and RV systolic  function. RV would indicate if there are any signs of a pulmonary embolus.  However, his lower extremity exam shows no sign of a DVT.  Continue aspirin,  statin, start metoprolol 25 mg b.i.d. The patient is not a candidate for  anticoagulation. Therefore, will continue to medically manage his  __________. He has no signs of heart failure. We will continue to follow  with you. Please do not  hesitate to call if there are any other questions. I  appreciate the consult.           ______________________________  Leonard Downing. Pernell Dupre, M.D.     Loralie Champagne  D:  01/04/2005  T:  01/05/2005  Job:  [id number]

## 2010-07-26 NOTE — Telephone Encounter (Signed)
Wife calls stating that pt is still having digestive problems, (diarrhea off and on), and wonders what Dr. Caryl Never suggest to be the next step.  Pt has see Dr. Juanda Chance in the past.

## 2010-07-26 NOTE — Assessment & Plan Note (Signed)
[facility]                            CARDIOLOGY OFFICE NOTE   [name], [name]                        MRN:          [id number]  DATE:02/10/2006                            DOB:          February 16, 1930    CLINICAL. HISTORY:  [name] is 77 years old, and had atherectomy of  the diagonal branch and drug-eluting stent in the left anterior  descending artery in September of 2005.  He had residual occlusion of a  marginal branch of the circumflex artery.  He had a non-ST elevation  infarction following back surgery by Dr. Otelia Sergeant early this year, but he  had no EKG changes and subsequently had a negative Cardiolite.   He has done quite well from the standpoint of his heart, he has had no  recent chest pain, shortness of breath or palpitations.   PAST MEDICAL HISTORY:  1. Significant for severe venous insufficiency in the lower extremity      with cellulitis, but this has been managed with Lasix, elevation      and support hose, and is doing quite well at present.  2. Also significant for Parkinson's disease, and he has a brain      stimulator that has been implanted for this with a recent generator      change.  3. 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  4. Hyperlipidemia.   CURRENT MEDICATIONS:  Paroxetine, Mirapex, carbidopa, Plavix, aspirin,  Lasix, potassium, simvastatin, Comtan.  We substituted simvastatin for  Lipitor after his last visit for cost reasons.   PHYSICAL EXAMINATION:  Blood pressure is 116/68 and the pulse is 60 and  regular.  There was no venous distention.  The carotid pulses were full without  bruits.  CHEST:  Clear.  CARDIAC:  Rhythm was regular.  I could hear no murmurs or gallops.  ABDOMEN:  Soft without organomegaly.  Peripheral pulses are full, and he had support hose but there was no  perceptible edema.   IMPRESSION:  1. Coronary artery disease, status post percutaneous coronary  intervention with a drug-eluting stent September 2005, now stable.  2. Good left ventricular function.  3. Venous insufficiency of the lower extremities, now with marked      improvement.  4. Hyperlipidemia.  5. Degenerative disease of the left knee.  6. Lumbar spine disease.  7. Parkinson's disease, status post brain stimulator with recent      generator change.   RECOMMENDATIONS:  I think [name] is doing well.  We will plan to get  a lipid profile, since we switched him from Lipitor to simvastatin.  We  will get a lipid and liver profile.  I will plan  to see him back in followup in a year.  He indicated that he recently  had eye surgery in [place], and he went off his Plavix with our  approval for about 5 days.     Bruce Elvera Lennox Juanda Chance, MD, [facility]  Electronically Signed    BRB/MedQ  DD: 02/10/2006  DT: 02/11/2006  Job #: [id number]   cc:  Gloriajean Dell. Andrey Campanile, M.D.  Genene Churn. Love, M.D.  Danae Orleans. Venetia Maxon, M.D.

## 2010-07-26 NOTE — Consult Note (Signed)
NAMETORON, BOWRING NO.:  [id number]   MEDICAL RECORD NO.:  [id number]          PATIENT TYPE:  INP   LOCATION:  3107                         FACILITY:  [facility]   PHYSICIAN:  Kerrin Champagne, M.D.   DATE OF BIRTH:  Nov 14, 1929   DATE OF CONSULTATION:  DATE OF DISCHARGE:                                   CONSULTATION   REASON FOR CONSULTATION:  Parkinson's.   HISTORY OF PRESENT ILLNESS:  Mr. Fowles is a 77 year old Caucasian male with  a past medical history of CAD status post stent, increased lipids, and  Parkinson's status post DBS who had LS fusion on January 03, 2005.  Status  post surgery his Parkinson's medications were held and DBS was stopped  before surgery.  The patient had some worsening symptoms with rigid hand  shaking.  The patient is currently laying in bed complaining of lower back  pain and left lower extremity pain causing him to shake his left leg at  times.  The patient's DBS was restarted last evening along with Parkinson's  medications and the nurse noted that the patient's status has improved over  this morning, after the initiation of his Parkinson's medications.  The  patient's baseline consists of upper and lower extremity shaking, being  unsteady on his feet, and bradykinesia. His speech at baseline is also  slowed and declined.  The patient is currently laying in bed and states the  pain in his back and leg is causing him to move his leg.  The patient denies  any headaches, numbness, tingling, vision changes, speech changes,  dizziness, or loss of consciousness.  Restarted on his DBS last evening and  states that it is working optimally.   PHYSICAL EXAMINATION:  VITAL SIGNS:  Temperature is 98.4, pressure is  140/60, pulse is 85, respirations are in the 20s, O2 saturation is 99% on 2  L.  HEENT: Normocephalic, atraumatic.  Extraocular movements intact.  Pupils  equal, round, and reactive to light.  NECK:  Supple.  No carotid bruits are  heard.  HEART:  Regular.  LUNGS:  Clear.  ABDOMEN:  Soft.  EXTREMITIES:  No edema with good pulses.  NEUROLOGICAL:  The patient is alert and oriented x3.  Language is slow, yet  fluent.  Memory and knowledge are within normal limits.  Cranial nerves II-  XII are grossly intact.  Motor examination:  The patient moves all four  extremities well.  He does have some decreased movement in the left lower  extremity secondary to his discomfort.  The patient has good tone throughout  and slightly increased tone in his lower extremities secondary to pain.  Sensory examination is within normal limits to light touch.  Reflexes are  trace to 1+ throughout.  Toes are neutral bilaterally.  Cerebellar function  and gait are not assessed secondary to safety.   LABORATORY STUDIES:  WBC 7.9, hemoglobin 11.3, hematocrit 33, platelets 288.  Sodium 135, potassium 4.3, chloride 103, bicarb 22, BUN 17, creatinine 1.1,  glucose is 240.  PT is 13.5, INR is 1, PTT is 35.  Calcium is 9.5.   IMPRESSION:  This is a 77 year old Caucasian male with Parkinson's status  post surgery in which DBS and medications were held for the first seven to  eight hours.  With the patient's surgical stress and stopping of his  medications and DBS, the patient is likely to have transient worsening of  his Parkinson symptoms.  Since the reinitiation of his DBS and medications,  the patient's symptoms have improved.  We should continue his current dose  of medications for now and we will follow his progress.  The patient does  not seem to have any significant Parkinson features such as cogwheel  rigidity or tremendous amount of tremors in his upper extremities.  The  patient does seem to be in a lot of discomfort from his back and left lower  extremity causing him to shake.  The patient  states he does move his leg because of the discomfort.  I would like to  treat the discomfort by increasing his OxyContin to 30 mg b.i.d. for now  and  recommend that physical therapy and occupational therapy follow him and work  with him.  We will follow his progress while he is in the hospital.     ______________________________  Bevelyn Buckles. Nash Shearer, M.D.      Kerrin Champagne, M.D.  Electronically Signed    DRC/MEDQ  D:  01/04/2005  T:  01/04/2005  Job:  [id number]

## 2010-07-26 NOTE — Discharge Summary (Signed)
[name], [name] NO.:  [id number]   MEDICAL RECORD NO.:  [id number]          PATIENT TYPE:  INP   LOCATION:  1615                         FACILITY:  [facility]   PHYSICIAN:  Lonia Blood, M.D.DATE OF BIRTH:  02/08/1930   DATE OF ADMISSION:  09/27/2005  DATE OF DISCHARGE:  10/02/2005                                 DISCHARGE SUMMARY   PRIMARY CARE PHYSICIAN:  [facility].   DISCHARGE DIAGNOSES:  1. Left inguinal pain.      a.     Felt to be secondary to lymphadenopathy.      b.     Status post lymph node biopsy, with results currently pending.  2. Right lower extremity cellulitis in the setting of chronic stasis      dermatitis.      a.     Deep venous thrombosis ruled out with venous Doppler.      b.     Clinically improved with antibiotic therapy.  3. Parkinson's disease, status post deep brain stimulator implantation.  4. Chronic back pain secondary to degenerative disk disease.  5. Spinal stenosis, status post laminectomy in 2005.  6. History of urolithiasis.  7. Coronary artery disease, status post percutaneous transluminal coronary      angioplasty.  8. History of tuberculosis as a child.  9. History of gastric ulcer with bleed in April of 2006.  10.Right total knee arthroplasty.  11.Spinal laminectomy with fusion at L4 to L5 and L5 to S1 level.  12.Cataract extraction in 2005.   DISCHARGE MEDICATIONS:  1. Aspirin 81 mg daily.  2. Paxil 20 mg daily.  3. Lipitor 80 mg daily.  4. Sinemet 25/250 one-half tablet every three hours.  5. Mirapex 1 mg, one-half tablet every three hours.  6. Plavix 75 mg daily.  7. Nasonex nasal spray, one spray each nostril daily.  8. Avelox 400 mg p.o. daily for seven days.   FOLLOW UP:  The patient is advised to follow up with his primary care  physician at [facility] in 7 to 10 days.  Followup issues  would be to focus on the patient's right lower extremity and to assure that  it continues to improve.  Also, a followup should include checking of the  lymph node pathology biopsy results, as they were pending at the time of  discharge.   WOUND CARE:  The patient is encouraged to wear his compression sleeve on  bilateral lower extremities on a daily basis.   PROCEDURES:  1. Bilateral lower extremity venous Dopplers revealing no evidence of DVT.  2. 18-gauge core biopsy, right inguinal adenopathy, by interventional      radiology on October 01, 2005.   HISTORY OF PRESENT ILLNESS:  Please see history and physical dictated by Dr.  Corky Downs on September 27, 2005.   HOSPITAL COURSE:  Mr. Dewoody is a 77 year old gentleman who was admitted to  the hospital on September 27, 2005 with complaints of severe left inguinal pain.  Evaluation at the time of admission also revealed evidence of a possible  right lower extremity  cellulitis.  The patient's white blood cell count was  initially 7.1.  Followup on the second day of hospitalization revealed a  marked change in his white blood cell count, with elevation at 25.4.  CT  scan of the abdomen and pelvis on September 27, 2005 revealed significant  inguinal and pelvic adenopathy bilaterally, raising the question of a  lymphoma.  Empiric antibiotic was initiated for what was felt to likely  represent a right lower extremity cellulitis.  Antibiotic was continued.  The patient's white blood cell count quickly normalized, and at the time of  discharge, his white blood cell count was 9.4.  There was concern of  possible DVT, and Doppler evaluations were obtained.  There was no evidence  of DVT.  The patient's lower extremity symptoms, specifically calor and  erythema at the patient's right shin, improved significantly.  At the time  of discharge, erythema remained but there was no further calor.  As noted  above, white count had normalized, and the patient remained afebrile.   In regard to the patient's lymphadenopathy, a decision was made that a   formal tissue evaluation should be pursued.  In the setting of a probable  cellulitis, it is very likely that these were simply reactive lymph nodes.  However, given the profound jump in the patient's white blood cell count to  25,000, the possibility of lymphoma must also be considered.  A core biopsy  was able to be obtained after abstaining from aspirin and Plavix on October 03, 2005.  At the time of discharge, aspirin and Plavix have resumed.  The  patient is having no significant complications from his biopsy.  Results are  currently pending.  The patient is, however, medically stable and cleared  for discharge home.  The patient will be contacted at home with the results  of his biopsy by this dictating physician, and further followup will be as  described above.      Lonia Blood, M.D.  Electronically Signed     JTM/MEDQ  D:  10/02/2005  T:  10/02/2005  Job:  [id number]   cc:   [facility]

## 2010-07-26 NOTE — H&P (Signed)
NAME:  [name], [name] NO.:  [id number]   MEDICAL RECORD NO.:  [id number]          PATIENT TYPE:  EMS   LOCATION:  MAJO                         FACILITY:  [facility]   PHYSICIAN:  Isidor Holts, M.D.  DATE OF BIRTH:  Jul 13, 1929   DATE OF ADMISSION:  06/11/2004  DATE OF DISCHARGE:                                HISTORY & PHYSICAL   PRIMARY MEDICAL DOCTOR:  Gloriajean Dell. Andrey Campanile, M.D.   CARDIOLOGIST:  Charlies Constable, M.D. [facility].   NEUROLOGIST:  Dr. Jean Rosenthal.   CHIEF COMPLAINT:  Shortness of breath and chest discomfort.   HISTORY OF PRESENT ILLNESS:  This is a 77 year old male, with known history  of coronary artery disease, status post PTCA and stent, status post MI, also  parkinsonism, and chronic back pain.  According to the patient, he has had a  cold since June 05, 2004.  He went to see his PMD, on April, 1, 2006, and  was prescribed an antibiotic, Augmentin, which he has been taking since.  In  the a.m. of June 11, 2004 at about 8 a.m., he became suddenly short of  breath and developed chest pressure, no radiation, which lasted  approximately 15 minutes.  Since then he has been having recurrent episodes  of chest pain.  He eventually called the emergency medical services.  Denies  diaphoresis or nausea with episodes.  Denies lower extremity swelling or  discomfort.  Has no history of prolonged travel or immobilization.   PAST MEDICAL HISTORY:  1.  Coronary artery disease, status post PTCA and stent, September 2005,      status post  peri-procedure MI.  2.  Parkinsonism, status post deep brain stimulator implantation.  3.  Chronic back pain secondary to DJD.  4.  Lumbar spinal stenosis, status post laminectomy June 2005.  5.  History of childhood tuberculosis.  6.  History of urolithiasis.  7.  Status post right total knee arthroplasty.   MEDICATIONS:  1.  Plavix 75 mg p.o. every day.  2.  Aspirin 325 mg p.o. every day.  3.  Lipitor 80 mg p.o. q.h.s.  4.  Mirapex  (1 mg) half tablet p.o. q.3h.  5.  Paroxetine hydrochloride (Paxil) 20 mg p.o. every day.  6.  Carbidopa/levodopa (25/250) half tablet p.o. q.3h.   ALLERGIES:  1.  NITROGLYCERIN.  2.  MORPHINE.   SYSTEMS REVIEW:  Essentially as per HPI and chief complaint.  Otherwise  negative.   SOCIAL/FAMILY HISTORY:  The patient lives with spouse.  He is a retired  Administrator, arts.  Nonsmoker, nondrinker.  Has no history of drug abuse.  He  has one brother with diabetes mellitus, two sisters who are alive and well.  His mother died of a ruptured aortic aneurysm.  His maternal grandfather  died of MI.  Family history is otherwise noncontributory.   PHYSICAL EXAMINATION:  VITAL SIGNS:  Temperature 97.8, pulse 65 per minute  regular, respiratory rate 18, BP 114/46-mmHg, pulse oximeter 100% on room  air.  GENERAL:  No shortness of breath at rest, however, is short of breath on  minimal  exertion.  The patient is alert, oriented, communicative.  HEENT:  No clinical pallor.  No jaundice.  Throat appears quite clear.  NECK:  Supple.  JVP not seen.  No palpable lymphadenopathy.  No palpable  goiter.  CHEST:  Clinically clear to auscultation.  No wheezes.  No crackles.  HEART:  Sounds 1 and 2 heard normal regular.  No murmurs.  Brain stimulator  pack is noted in a left subclavian pouch.  ABDOMEN:  Obese, soft, and nontender.  There is no palpable organomegaly.  No palpable masses.  Normal bowel sounds.  Brain stimulator pack is noted in  the left upper quadrant pouch.  LOWER EXTREMITIES:  The patient has stasis eczema bilaterally also  varicosities, but there is no pitting edema.  Peripheral pulses are  palpable.  CENTRAL NERVOUS SYSTEM:  Parkinsonian tremor was noted.  Otherwise, central  nervous system was not formally examined.  MUSCULOSKELETAL:  Also not formally examined.   INVESTIGATIONS:  CBC:  WBC 7.6, hemoglobin 6.9, hematocrit 20.1, platelets  205, MCV 89.2.  Electrolytes:  Sodium 139,  potassium 3.5, chloride 109, CO2  23, BUN 27, creatinine 1.0, glucose 104.  ALT less than 9, AST 19, alkaline  phosphatase 64.  Troponin I at point-of-care less than 0.05 x 3.  EKG shows  sinus rhythm, regular, 65 per minute, normal axis, no acute ischemic  changes.  Chest x-ray, dated June 11, 2004, showed no acute abnormalities.   ASSESSMENT/PLAN:  1.  Chest pain with shortness of breath.  The patient has a history of      coronary artery disease, however, no acute ischemic changes are seen on      electrocardiogram.  Cardiac enzymes appear normal so far.  The patient      has no obvious predisposition to pulmonary embolism, however, for      completeness we will check a D-dimer.  Meanwhile, we will admit for      telemetry observation, cycle cardiac enzymes, and electrocardiogram, and      arrange a 2D echocardiogram.   1.  Moderate normocytic anemia.  Query etiology.  We shall check iron      studies, i.e. iron ferritin, TIBC, also do B12 and folate levels, and      check stool guaiac.  Meanwhile as anemia of this severity is likely to      be contributory to #1 above, we will transfuse two units packed red      blood cells and monitor CBC.  We shall give 20 mg of Lasix for the      second unit of packed red blood cells, as the patient has a history of      coronary artery disease, to avoid fluid overload.   1.  History of parkinsonism, appears currently stable.  We shall continue      preadmission medications for now.   1.  History of back pain.  We will administer prn analgesics.   1.  We will carry out gastrointestinal prophylaxis with Protonix and deep      vein thrombosis prophylaxis with sequential compression device boots.  Further management will depend on clinical course.      CO/MEDQ  D:  06/12/2004  T:  06/12/2004  Job:  [id number]   cc:   Gloriajean Dell. Andrey Campanile, M.D.  [address]  [place]  [place] [address]  Fax: [phone]  Charlies Constable, M.D. [facility]

## 2010-07-26 NOTE — Discharge Summary (Signed)
NAME:  [name], [name] NO.:  [id number]   MEDICAL RECORD NO.:  [id number]          PATIENT TYPE:  INP   LOCATION:  3732                         FACILITY:  [facility]   PHYSICIAN:  Kerrin Champagne, M.D.   DATE OF BIRTH:  Dec 05, 1929   DATE OF ADMISSION:  01/03/2005  DATE OF DISCHARGE:                                 DISCHARGE SUMMARY   He was admitted to [facility] on January 03, 2005, in preparation  for surgery on his lumbar spine secondary to herniated nucleus pulposus by  Dr. Vira Browns.   ADMISSION DIAGNOSES:  1.  Herniated nucleus pulposus, L3-4, with painful spondylolisthesis, L4-5,      L5-S1, and with foraminal entrapment at L5-S1.  2.  Parkinson disease.  3.  Hyperlipidemia.  4.  Anemia.  5.  Diverticulosis.  6.  Spinal stenosis.  7.  Nephrolithiasis.  8.  Right knee arthropathy.  9.  Arthroplasty.  10. Coronary artery disease with stent placement in September 2005.   He was on Plavix for one year. They did stop the Plavix two weeks prior to  surgery.   PROCEDURE:  On January 03, 2005, by Dr. Vira Browns for T-LIF at L3-4, L4-5,  L5-S1, posterolateral with fusion, pedicle screws and rods, local bone graft  to L3 through S1.   He was then admitted postoperatively for pain management to resume his  cardiac medications including the Plavix and physical therapy   CONSULTATIONS:  On January 04, 2005. Secondary to Parkinson disease and  rigidity. This consult was done by Dr. Bevelyn Buckles. [place]. Medication was  held for the first seven to eight hours and surgical stress would increase  in Parkinson's type symptoms. We will institute his DBS and medications, and  symptoms will improve. Continue physical therapy and pain management. He  will follow up in their office in approximately two to three weeks once  discharged from the hospital. He did have a consult on January 09, 2005,  from cardiology secondary to an MI.   He has stable vital signs,  respirations were stable, pulse 64, blood  pressure 97/59 with oxygen saturation of 97%, and afebrile. Heart had a  regular rate and rhythm. No murmurs noted. Sodium was slightly lowered at  132, potassium 4.2, chloride 101, BUN 18, creatinine 1.0, and glucose was  136. He had a non-ST elevated MI perioperatively. They are planning on doing  studies on an outpatient basis. Continue the Plavix and nursing is going to  call for follow-up visit with cardiology and will inform the patient of his  follow-up visit. He also had a dermatology consult for a rash on January 09, 2005, by Dr. Joseph Art. They did a 3-mm punch biopsy of left lower back. They  are going to observe this as they ordered prednisone and will have him  follow up with the dermatologist in two to three weeks post discharge.  Otherwise, further instructed, need to call the office for that.   Preoperative labs include white blood cell count of 7.9, red blood cell  count of 3.43, hemoglobin  10.8, hematocrit 31.4, platelet count of 288,000.  PT was 13.5, INR 1.0, PTT 35. Sodium was 142, potassium 4.1, chloride 108,  CO2 26, glucose 100. BUN 14, creatinine 1.0.   X-rays that were taken included a two-view chest x-ray that showed normal  heart size, thoracic aorta mildly tortuous, lungs clear, no pleural fluid.  Degenerative changes were seen in the spine. No acute cardiopulmonary  process. ECG was done on the 28th showed a normal sinus rhythm, normal ECG.  Then another shows junctional ST depression, a probably normal borderline  ECG, and neither one of them are confirmed.   CURRENT MEDICATIONS:  1.  Paxil 20 mg once daily.  2.  Lipitor 80 mg once daily.  3.  Enteric-coated aspirin 81 mg daily.  4.  Colace 100 mg b.i.d.  5.  Trinsicon tablets b.i.d.  6.  Protonix 40 mg once daily.  7.  Sinemet 0.5 mg p.o. q.3h., 25/250 tablet.  8.  Mirapex 0.5 mg p.o. q.3h.  9.  K-Dur 20 mEq p.o. b.i.d.  10. Senokot one tablet p.o. daily.  11.  Plavix 75 mg tablet p.o. daily.  12. Heparin 50 mg q.24h.  13. Lopressor 12.5 mg b.i.d.  14. Claritin 10 mg daily.  15. Prednisone 20 mg b.i.d. times five days; dose was started on January 08, 2005.  16. Percocet 5/325 mg  one to two tablets q.4-6h. p.r.n. pain control.  17. Phenergan 12.5/25 mg p.o. q.6h. p.r.n. nausea.  18. Robaxin 500 mg p.o. q.8h. p.r.n. muscle spasms.  19. Restoril 15 mg q.h.s. p.r.n. sleep.  20. Oxycodone 20 mg timed release p.o. b.i.d. p.r.n. pain control.   PLAN:  We are having him to go to an extended care facility for rehab,  continue all medications per dictated previously. Follow-up with  dermatologist, cardiologist, and internal medicine in two to three weeks or  as otherwise specified per nursing instruction. Physical therapy for gait,  ambulation, strengthening. May shower after seven days. Otherwise, keep  clean and dry dressing on the incision site.   DISPOSITION:  Stable.   FOLLOWUP:  With Dr. Otelia Sergeant in the office in approximately 7-10 days from  now.   DIET:  As tolerates.      Lianne Cure, P.A.      Kerrin Champagne, M.D.  Electronically Signed    MC/MEDQ  D:  01/10/2005  T:  01/10/2005  Job:  [id number]

## 2010-07-26 NOTE — Op Note (Signed)
NAMEYERICK, EGGEBRECHT NO.:  [id number]   MEDICAL RECORD NO.:  [id number]          PATIENT TYPE:  INP   LOCATION:  2899                         FACILITY:  [facility]   PHYSICIAN:  Danae Orleans. Venetia Maxon, M.D.  DATE OF BIRTH:  [birth year]   DATE OF PROCEDURE:  07/25/2005  DATE OF DISCHARGE:                                 OPERATIVE REPORT   PREOPERATIVE DIAGNOSIS:  Bilateral deep brain stimulator battery depletion.   POSTOPERATIVE DIAGNOSIS:  Bilateral deep brain stimulator battery depletion.   PROCEDURE:  Bilateral Soletra deep brain stimulator battery change.   SURGEON:  Danae Orleans. Venetia Maxon, MD.   ANESTHESIA:  Monitored sedation with local anesthetic.   ESTIMATED BLOOD LOSS:  Minimal.   COMPLICATIONS:  None.   DISPOSITION:  Recovery.   INDICATIONS:  [name] is a 77 year old man with Parkinson's disease.  He  has had a prior deep brain stimulator placement and had depleted implantable  pulse generators, and it was elected to change those.   PROCEDURE:  [name] was brought to the operating room.  He was placed in  a supine position on the operating table, and his left upper chest and left  subcostal region were then prepped and draped in the usual sterile fashion.  His previous incisions were infiltrated with .25% Marcaine and 0.50%  Lidocaine with 1:200,000 epinephrine, and subsequently they were reopened,  the batteries were removed and changed, and new connections were placed  using the previous extensions.  The batteries were then placed into the  subcutaneous pockets.  The incisions were closed with #2-0 and #3-0 Vicryl  sutures, and the wounds were dressed with Benzoin, Steri-Strips, Telfa  gauze, and tape.  The patient was taken to recovery in a stable and  satisfactory condition having tolerated his operation well.  Counts were  correct at the end of the case.  The patient tolerated the procedure without  difficulty.      Danae Orleans. Venetia Maxon, M.D.  Electronically Signed     JDS/MEDQ  D:  07/25/2005  T:  07/26/2005  Job:  [id number]

## 2010-07-26 NOTE — Op Note (Signed)
NAME:  [name], [name] NO.:  [id number]   MEDICAL RECORD NO.:  [id number]                   PATIENT TYPE:  INP   LOCATION:  2871                                 FACILITY:  [facility]   PHYSICIAN:  Kerrin Champagne, M.D.                DATE OF BIRTH:  1929-07-20   DATE OF PROCEDURE:  08/10/2003  DATE OF DISCHARGE:                                 OPERATIVE REPORT   PREOPERATIVE DIAGNOSIS:  Central and biforaminal stenosis at L3-4 and L4-5,  foraminal stenosis bilateral L5-S1.   POSTOPERATIVE DIAGNOSIS:  Central and biforaminal stenosis at L3-4 and L4-5,  foraminal stenosis bilateral L5-S1.   PROCEDURE:  Central laminectomy L3-4 and L4-5, bilateral recess  decompression at both of these segments as well as at L2-3, decompression of  bilateral L3, bilateral L4, and bilateral L5 nerve roots.   SURGEON:  Kerrin Champagne, M.D.   ASSISTANT:  Wende Neighbors, P.A.   ANESTHESIA:  GOT, Maren [place], M.D.   ESTIMATED BLOOD LOSS:  200 mL.   DRAINS:  Hemovac x 1.   BRIEF CLINICAL HISTORY:  The patient is a 77 year old male with history of  Parkinson's disease.  He is developing progressive neurogenic claudication  worse than expected with Parkinson's disease alone.  The patient is  developing pain with standing and prolonged walking.  He underwent an  initial evaluation with myelogram demonstrating severe lumbar spinal  stenosis of both L3-4 and L4-5, foraminal entrapment right side greater than  left at the L4-5 and L3-4 levels, and bilateral foraminal stenosis L5-S1.   INTRAOPERATIVE FINDINGS:  The patient is found to have central canal  stenosis at L3-4 and L4-5 with significant neural foraminal narrowing on the  right side at L3-4 and L4-5 affecting primarily the L3 and L4 nerve roots.  L5 is also affected in the lateral recess and in the entry point into the  neural foramen.  Bilateral recess stenosis at the L4-5 level.   DESCRIPTION OF PROCEDURE:   After adequate general anesthesia and with the  patient in a prone position following preoperative antibiotics, the Wilson  frame was used and all pressure points were well padded.  Standard prep with  Duraprep solution from the lower dorsal spine to the S2 level.  Draped in  the usual manner, an iodine Vi-Drape was used.  The incision made at the  expected L4 level approximately 3 cm in length through the skin and  subcutaneous layers down to the spinous processes of L4 and L5, a clamp  spaced on the spinous process of L4, intraoperative lateral radiograph  demonstrating the clamp on the L4 spinous process.  The incision then  extended cranially an additional 3 cm to 4 cm through the skin and  subcutaneous layers down to the lumbodorsal fascia.  This was then incised  on both sides of the spinous process of L4 and L3  up to the L2 level  superiorly and L5 inferiorly.  Cobbs used to elevate paralumbar muscles  bilaterally over the posterior aspect the elements of L2-3, L3, L4, and at  the L4-5 level.  Care taken not to enter the laminotomy intralaminar space  bilaterally.  A white-collar retractor was inserted and bleeders controlled  using electrocautery.  Next, Leksell rongeur were then used to carefully  remove some of the inferior aspect lamina of L4 bilaterally at the L4-5  interspace and then used to remove the spinous process of L4 centrally down  to the lamina.  The lamina was then further thinned using Leksell rongeur.  The spinous process and central portions and lamina of L3 were also  similarly removed.  A small portion of the inferior half of the lamina of L2  and superior third of the lamina of L5 were also resected in order to allow  for complete exposure of the interlaminar space at L2-3 and at L4-5.  A high-  speed bur was then used to thin the posterior lamina at L4 and at L3  bilaterally.  A 3-mm Kerrison was then able to be introduced in the midline  beneath the inferior  aspect of the lamina at L4 and used to resect the  central portions of lamina of L4 and then the central portions of lamina of  L3 up to the L2-3 interlaminar space.  Under loupe magnification, the  remaining portions of the lamina centrally were then carefully resected out  laterally to the lateral recesses on both sides.  A 1/2-inch osteotome was  then used to osteotomize the medial aspect of the facet at the L3-4 and L4-5  levels bilaterally and further debridement then performed of the medial  aspect of the ligamentum flavum at the L4-5 facet and at the L3-4 facet  bilaterally.  The D'Errico retractor was used for retraction purposes and a  hockey-stick neuroprobe used to carefully probe the neural foramen and the  lateral recess identifying the pedicles at the L3 level, L4 level, and L5  level on both sides.   The operating room microscope was draped and then brought into the field  under direct visualization then care was taken to ensure that adequate  decompression was carried out into the lateral recesses decompressing the  lateral recess out to the level of the pedicle at L3 and L4 at the L2-3  level, at the L3-4 level, and also at the L4-5 level resecting the medial  portions of the facet over approximately 20% to 30% each side to allow for  decompression of the lateral recess in the thecal sac at each segment of  facets.  A superior articular process of L5 was resected on the right side  and the foramen was decompressed for the right L4 nerve root carefully  retracting the thecal sac, placing a hockey-stick neuroprobe over the L4  nerve root and resecting the reflected portion of ligamentum flavum then the  superior portion of the superior articular process within the foramen.  The  facet joint was undercut on this side.  Then, on the right side foraminotomy  performed over the right L3 nerve root, the lateral recess decompressed at L2-3 both sides, and the ligamentum flavum  resected centrally on the lateral  recess at L2-3 both sides.  The left side was similarly approached and  decompression of the lateral recess at L2-3, L3-4, and L4-5 and  foraminotomies performed over the L3, L4, and L5 nerve roots  decompressing  these using 3-mm Kerrisons under microscope.  Bleeding controlled and oozing  bone wax applied to bleeding cancellous bone surfaces, bipolar used to  control small epidural bleeders particularly over the right side of L4-5.  Hard disk protrusion noted over the right central portions of the disks at  the L3-4 and L4-5 levels felt to represent primarily spurs over the  posterior aspect of the disks not requiring resection.  Following  decompression, the hockey-stick neuroprobe could be passed anterior to the  nerve root at the L3 level and L4 level as they exited out the neural  foramen as well as posteriorly assessing the foramen and noting that they  were well decompressed at each segment as well as L5 bilaterally.   With this, irrigation was performed and thrombin-soaked Gelfoam placed and  excess Gelfoam removed.  Gelfoam then placed over the laminotomy defect  posteriorly.  A medium Hemovac drain placed in the depths of the incision  exiting over the right lower lumbar spine.  The paralumbar muscles were  approximated loosely in the midline with interrupted #1 Vicryl sutures.  The  lumbodorsal fascia was approximated in the midline with interrupted sutures  of #1 Vicryl.  The deep SQ layer was approximated with interrupted 0 Vicryl  sutures and 2-0 Vicryl sutures and the skin closed with a running SQ stitch  of 4-0 Vicryl.  Tincture, Benzoin, and Steri-Strips applied and 4 x 4's  affixed to the skin with hypofix tape.  The patient then returned to a  supine position, reactivated, extubated, and returned to the recovery room  in satisfactory condition.  All instruments and sponge counts were correct.                                                Kerrin Champagne, M.D.    Myra Rude  D:  08/10/2003  T:  08/10/2003  Job:  [id number]

## 2010-07-26 NOTE — Telephone Encounter (Signed)
I think it would be reasonable to see her again since he has seen in past.

## 2010-07-26 NOTE — Discharge Summary (Signed)
NAMELADARION, MUNYON NO.:  [id number]   MEDICAL RECORD NO.:  [id number]          PATIENT TYPE:  IPS   LOCATION:  4009                         FACILITY:  [facility]   PHYSICIAN:  Ellwood Dense, M.D.   DATE OF BIRTH:  21-Jun-1929   DATE OF ADMISSION:  01/13/2005  DATE OF DISCHARGE:  01/25/2005                                 DISCHARGE SUMMARY   DISCHARGE DIAGNOSIS:  1.  L4-L5 and L5-S1 spondylolisthesis with herniated nucleus pulposus L3-L4      requiring decompressive laminectomy with fusion.  2.  Perioperative myocardial infarction.  3.  Parkinson's disease.  4.  Constipation.  5.  Spasticity, improved.   HISTORY OF PRESENT ILLNESS:  [name] is a 77 year old male with advanced  Parkinson's coronary artery disease, central HNP L3-L4, with  spondylolisthesis L4-L5 and L5-S1 with stenosis.  The patient elected to  undergo redo decompressive laminectomy with TLIF with Leopard cage October  27 by Dr. Otelia Sergeant.  Postop worsening of Parkinson's with rigidity and shakes  secondary to stopping of BBS and Parkinson's meds per neuro consult.  The  patient was started on OxyContin that also caused issues with confusion.  Other problems included elevated cardiac enzymes secondary to NST MI.  Cardiac echo was done as part of workup showing EF of 55-65%, mild MVR,  increase in pulmonary artery pressure.  Low dose beta blocker and Plavix was  resumed per cardiology input.  Patient with continued low back pain and left  upper extremity and left lower extremity weakness.  On November 2, thoracic  lumbar spine CT showed some edema L2 to S1 and the patient was started on  steroids.  The patient also developed a diffuse rash on the back and  inguinal area that was biopsied by Dr. Joseph Art.  The patient was started on  prednisone for dermatitis and Dr. Joseph Art felt this was probably a drug  reaction.  He recommends observing it for now.  When stable with medical  issues, the patient was  mobilized with therapies.  Currently, he is  requiring assistance for ADLs, noted to have unsteadiness and difficulty  advancing lower extremity, having difficulty with back precautions secondary  to impairment in mobility and ADLs.  Rehab was consulted for further  therapy.   PAST MEDICAL HISTORY:  Significant for Parkinson's disease with DBS,  coronary artery disease, PTCA in 2005, spinal stenosis, nephrolithiasis x 3,  diverticulosis, anemia, history of TB as a child, appendectomy, right total  knee replacement, PUD in April 2006.   ALLERGIES:  Morphine and nitroglycerin.   FAMILY HISTORY:  Positive for triple A, diabetes, and Parkinson's disease.   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  He lives in a one level home with four steps  at entry.  He does not use any tobacco or alcohol.   HOSPITAL COURSE:  [name] was admitted to rehab on January 13, 2005,  for inpatient therapies to consist of PT and OT daily.  Past admission, his  Mirapex and Sinemet was  continued q.3h. even through the night.  This has  greatly helped his rigidity and mobility.  Constipation was treated with  additional Senna 2 p.o. q.h.s.  Routine back precautions were emphasized and  the patient was able to recall this on discharge.  Labs done past admission  revealed hemoglobin 11.5, hematocrit 34.6, white count 16.9, platelets 402.  Check of electrolytes revealed sodium 139, potassium 4.3, chloride 106, CO2  26, BUN 18, creatinine 1, glucose 101.  Leukocytosis was contributed  secondary to steroids.  UA and UC was checked to rule out infection and this  was negative.  The back incision was noted to be healing well without any  signs of  infection.  Pain control was reasonable with p.r.n. use of Ultram.  The patient did have issue with spasticity and was started on Robaxin.  This  was slowly increased to q.i.d. with much improvement in  his symptomatology.  The patient made steady progress during his stay.  By the time of discharge,  he was at supervision level for bathing, required set up assist for upper  body dressing with TLSO.  He was at supervision for toileting, close  supervision to supervision for transfers, supervision ambulating 200 feet  with a rolling walker in controlled environment.  Mobility has improved  greatly and safety awareness had also improved by the time of discharge.  The patient was set up for further follow up therapies to include home  health physical therapy and occupational therapy by [facility].  On January 25, 2005, the patient is discharged to home.   DISCHARGE MEDICATIONS:  Paxil 20 mg daily, Lipitor 80 mg q.h.s., Sinemet  25/250, 1/2 p.o. q.3h., Mirapex 0.5 mg q.3h., Trinsicon 1 p.o. b.i.d.,  Plavix 75 mg daily , Lopressor 25 mg 1/2 b.i.d., K-Dur 20 mEq daily, coated  aspirin 81 mg daily, Ultram 50 mg q.i.d. p.r.n. pain, Robaxin 500 mg q.i.d.   DISCHARGE INSTRUCTIONS:  Diet is regular.  Activities as tolerated with  walker and supervision.  No driving or strenuous activity.  May use Ultram  or Tylenol for pain.  To set alarm to continue using Parkinson's meds q.3h.  throughout the night.  Follow routine back precautions, TLSO when out of  bed.  The patient is to follow up with Dr. Otelia Sergeant and Dr. Andrey Campanile for routine  check in the next few weeks.  Follow up with Dr. Thomasena Edis as needed.      Greg Cutter, P.A.    ______________________________  Ellwood Dense, M.D.    PP/MEDQ  D:  03/24/2005  T:  03/24/2005  Job:  [id number]   cc:   Kerrin Champagne, M.D.  Fax: [phone]   Gloriajean Dell. Andrey Campanile, M.D.  Fax: [phone]   Charlies Constable, M.D. [facility]  [address]  [address]  [place]  [place] [address]   Bevelyn Buckles. Nash Shearer, M.D.  Fax: [phone]

## 2010-07-29 NOTE — Telephone Encounter (Signed)
Spoke with wife and she will make an appointment and will call back if they need anything

## 2010-07-31 ENCOUNTER — Telehealth: Payer: Self-pay | Admitting: Internal Medicine

## 2010-07-31 NOTE — Telephone Encounter (Signed)
First of all , start Metamucil 1 tablespoon daily. If no improvement add Align or any Probiotic 1 po qd

## 2010-07-31 NOTE — Telephone Encounter (Signed)
Patient's wife calling to report patient has been alternating between constipation and loose or diarrhea stools. States he may go 2-3 days with no bowel movement, then he has lots of gas and then diarrhea or a loose stool for several days then constipation again. States he saw Dr. Caryl Never and talked with him about this but did not come up with anything that helps. Imodium does help the diarrhea but then the cycle starts over again. She states the patient has been on and off antibiotics lately. Please, advise.

## 2010-08-01 NOTE — Telephone Encounter (Signed)
Spoke with patient's wife and gave her Dr. Regino Schultze recommendations.

## 2010-08-30 ENCOUNTER — Other Ambulatory Visit: Payer: Self-pay | Admitting: Family Medicine

## 2010-09-02 ENCOUNTER — Other Ambulatory Visit: Payer: Self-pay | Admitting: *Deleted

## 2010-09-02 MED ORDER — PAROXETINE HCL 20 MG PO TABS: 20.0000 mg | ORAL_TABLET | ORAL | Status: DC

## 2010-09-04 ENCOUNTER — Encounter: Payer: Self-pay | Admitting: Family Medicine

## 2010-09-04 ENCOUNTER — Ambulatory Visit (INDEPENDENT_AMBULATORY_CARE_PROVIDER_SITE_OTHER): Payer: Medicare Other | Admitting: Family Medicine

## 2010-09-04 VITALS — BP 148/80 | Temp 98.1°F | Wt 216.0 lb

## 2010-09-04 DIAGNOSIS — G20 Parkinson's disease: Secondary | ICD-10-CM

## 2010-09-04 DIAGNOSIS — L989 Disorder of the skin and subcutaneous tissue, unspecified: Secondary | ICD-10-CM

## 2010-09-04 DIAGNOSIS — I10 Essential (primary) hypertension: Secondary | ICD-10-CM

## 2010-09-04 NOTE — Patient Instructions (Signed)
Keep wound dry for the first 24 hours then clean daily with soap and water for one week. Apply topical antibiotic daily for 3-4 days. Keep covered with clean dressing for 4-5 days. Follow up promptly for any signs of infection such as redness, warmth, pain, or drainage.  

## 2010-09-04 NOTE — Progress Notes (Signed)
  Subjective:    Patient ID: [name], male    DOB: 1930-01-05, 77 y.o.   MRN: [id number]  HPI Patient seen for medical followup. Chronic problems or Parkinson's disease, hypertension, CAD, history of atrial fibrillation. Patient seen today with right facial skin lesion. Present unknown duration but at least a few months and growing. No pain or itching. No personal history of skin cancer.  Patient requesting DO NOT RESUSCITATE orders. He has full understanding of what this constitutes.  Parkinson's stable. In general has extreme fatigue and very limited activity. No recent falls but increased risk     Review of Systems As per HPI.     Objective:   Physical Exam  Constitutional: He is oriented to person, place, and time. He appears well-developed and well-nourished.  Cardiovascular: Normal rate and regular rhythm.   Pulmonary/Chest: Effort normal and breath sounds normal. No respiratory distress. He has no wheezes. He has no rales.  Musculoskeletal: He exhibits no edema.  Neurological: He is alert and oriented to person, place, and time.  Skin:       Patient has fleshy colored nodular lesion 8-9 mm diameter right for head. No pigmentary change. Few telangiectasias. No ulceration          Assessment & Plan:  Nodular skin lesion right forehead. Suspect probable basal cell cancer. Reviewed risks and benefits of shave excision patient consented.  Prepped Betadine. Anesthesia 1% Xylocaine with epinephrine. Shave with #15 blade. Minimal bleeding controlled with Drysol. Topical antibiotic and dressing applied. Wound care wound care instruction given. [facility]imen [facility]  Parkinson's disease stable. High-risk for falls. Discussed possible physical therapy for fall risk reduction but has been to this type program the past

## 2010-09-06 NOTE — Progress Notes (Signed)
Quick Note:  Pt wife informed, appt scheduled 7/25 ______

## 2010-09-09 ENCOUNTER — Encounter: Payer: Self-pay | Admitting: Internal Medicine

## 2010-09-09 ENCOUNTER — Ambulatory Visit (INDEPENDENT_AMBULATORY_CARE_PROVIDER_SITE_OTHER): Payer: Medicare Other | Admitting: Internal Medicine

## 2010-09-09 VITALS — BP 126/80 | HR 60 | Ht 72.0 in | Wt 214.4 lb

## 2010-09-09 DIAGNOSIS — R142 Eructation: Secondary | ICD-10-CM

## 2010-09-09 DIAGNOSIS — R143 Flatulence: Secondary | ICD-10-CM

## 2010-09-09 NOTE — Progress Notes (Signed)
[name] 04-28-1929 MRN [id number]        History of Present Illness:  This is a 77 year old white male with the Parkinson's disease, coronary artery disease, ehe esophageal dysmotility. He has a history of upper GI bleed secondary to gastric ulcer while on Plavix and aspirin in April 2004 and a recurrent gastric ulcer on endoscopy in November 2008. He was H. pylori negative. His main complaint has been gas, bloating, fecal urgency and sometimes incontinence. He has been put on the probiotics with the improvement of his symptoms. Last colonoscopy in April 2004 showed hyperplastic polyp. He has taken Metamucil sporadically. He has been constipated. skipping several days at a time.   Past Medical History  Diagnosis Date  . HYPERLIPIDEMIA-MIXED 02/10/2008  . PARKINSON'S DISEASE 02/10/2008  . HYPERTENSION, BENIGN 02/09/2009  . Coronary atherosclerosis of native coronary artery 02/10/2008  . VENOUS INSUFFICIENCY 02/10/2008  . GASTROINTESTINAL HEMORRHAGE, HX OF 02/10/2008  . ANEMIA 04/29/2010  . FIBRILLATION, ATRIAL 04/29/2010  . VENTRAL HERNIA 04/29/2010  . DEGENERATIVE JOINT DISEASE 04/29/2010  . Gastric ulcer   . Osteomyelitis   . Positive PPD   . Internal hemorrhoids   . Hyperplastic colon polyp 2004   Past Surgical History  Procedure Date  . Replacement total knee 2002    right  . Nose surgery   . Ankle surgery     left  . Back surgery 2006    lumbar stenosis X 2  . Toe amputation     right foot  . Cataract extraction   . Carpal tunnel release     left, open  . Coronary angioplasty with stent placement   . Tonsillectomy   . Appendectomy     reports that he has never smoked. He has never used smokeless tobacco. He reports that he does not drink alcohol or use illicit drugs. family history includes Crohn's disease in his daughter; Diabetes in his brother; Heart disease in his paternal grandfather; and Parkinsonism in his sister.  There is no history of Colon  cancer. Allergies  Allergen Reactions  . Morphine         Review of Systems: Negative for chest pain shortness of breath, positive for stiffness and difficult ambulation. He denies rectal bleeding  The remainder of the 10  point ROS is negative except as outlined in H&P   Physical Exam: General appearance  Well developed in no distress, chronically ill-appearing. Difficulty with ambulation G2 general body stiffness Eyes- non icteric HEENT nontraumatic, normocephalic Mouth no lesions, tongue papillated, no cheilosis Neck supple without adenopathy, thyroid not enlarged, no carotid bruits, no JVD Lungs Clear to auscultation bilaterally Cor normal S1 normal S2, regular rhythm , no murmur,  quiet precordium Abdomen protuberant with a large umbilical hernia as well as ventral hernia. Increased tympany and distention but hyperactive bowel sounds but no tenderness  Rectal: Not done Extremities  1+ pedal edema Skin no lesions Neurological alert and oriented x3 Psychological normal mood and  affect  Assessment and Plan:   Bloating flatulence and abdominal distention may be related to, bacterial overgrowth, also possibly due to  functional constipation. Suspect the Parkinson's disease related to all esophageal as well as the intestinal dysmotility. Possible bacterial overgrowth causing excess gas. He will continue on probiotic one a day. He will start Metamucil 1 heaping teaspoon daily rather than when necessary. Continue Protonix 40 mg daily. Take MiraLax 17 g once or twice a week   09/09/2010 Lina Sar

## 2010-09-09 NOTE — Patient Instructions (Signed)
Dr B.Burchette 

## 2010-10-02 ENCOUNTER — Encounter: Payer: Self-pay | Admitting: Family Medicine

## 2010-10-02 ENCOUNTER — Ambulatory Visit (INDEPENDENT_AMBULATORY_CARE_PROVIDER_SITE_OTHER): Payer: Medicare Other | Admitting: Family Medicine

## 2010-10-02 DIAGNOSIS — I10 Essential (primary) hypertension: Secondary | ICD-10-CM

## 2010-10-02 DIAGNOSIS — E785 Hyperlipidemia, unspecified: Secondary | ICD-10-CM

## 2010-10-02 DIAGNOSIS — Z8659 Personal history of other mental and behavioral disorders: Secondary | ICD-10-CM

## 2010-10-02 DIAGNOSIS — I2510 Atherosclerotic heart disease of native coronary artery without angina pectoris: Secondary | ICD-10-CM

## 2010-10-02 DIAGNOSIS — J310 Chronic rhinitis: Secondary | ICD-10-CM

## 2010-10-02 MED ORDER — PAROXETINE HCL 20 MG PO TABS: 20.0000 mg | ORAL_TABLET | ORAL | Status: DC

## 2010-10-02 MED ORDER — FLUTICASONE PROPIONATE 50 MCG/ACT NA SUSP: 1.0000 | Freq: Every day | NASAL | Status: DC

## 2010-10-02 MED ORDER — SIMVASTATIN 40 MG PO TABS: 40.0000 mg | ORAL_TABLET | Freq: Every day | ORAL | Status: DC

## 2010-10-02 NOTE — Progress Notes (Signed)
Subjective:    Patient ID: [name], male    DOB: 12/24/1929, 77 y.o.   MRN: [id number]  HPI Followup multiple medical problems. Patient has advanced Parkinson's disease, hypertension, hyperlipidemia, CAD, history of atrial fibrillation, degenerative joint disease and past history of depression. Depression stable. Paxil 20 mg daily for several years. Requesting refills. No recent mood issues.  Frequent sinus congestive symptoms. No purulent secretions. Several weeks if not months of intermittent congestion. No history of seasonal allergies. No history of nasal polyps. Avoids decongestant with multiple medical problems. No clear drainage. More stuffiness. Alternates between right and left side.  Recent basal cell cancer excised right forehand. No evidence of recurrence. No problems with healing.  Hyperlipidemia previously treated simvastatin. Lipids in March LDL at goal. No side effects. Needs refills.  Past Medical History  Diagnosis Date  . HYPERLIPIDEMIA-MIXED 02/10/2008  . PARKINSON'S DISEASE 02/10/2008  . HYPERTENSION, BENIGN 02/09/2009  . Coronary atherosclerosis of native coronary artery 02/10/2008  . VENOUS INSUFFICIENCY 02/10/2008  . GASTROINTESTINAL HEMORRHAGE, HX OF 02/10/2008  . ANEMIA 04/29/2010  . FIBRILLATION, ATRIAL 04/29/2010  . VENTRAL HERNIA 04/29/2010  . DEGENERATIVE JOINT DISEASE 04/29/2010  . Gastric ulcer   . Osteomyelitis   . Positive PPD   . Internal hemorrhoids   . Hyperplastic colon polyp 2004   Past Surgical History  Procedure Date  . Replacement total knee 2002    right  . Nose surgery   . Ankle surgery     left  . Back surgery 2006    lumbar stenosis X 2  . Toe amputation     right foot  . Cataract extraction   . Carpal tunnel release     left, open  . Coronary angioplasty with stent placement   . Tonsillectomy   . Appendectomy     reports that he has never smoked. He has never used smokeless tobacco. He reports that he does not drink alcohol  or use illicit drugs. family history includes Crohn's disease in his daughter; Diabetes in his brother; Heart disease in his paternal grandfather; and Parkinsonism in his sister.  There is no history of Colon cancer. Allergies  Allergen Reactions  . Morphine       Review of Systems  Constitutional: Negative for fever, chills and activity change.  HENT: Negative for trouble swallowing.   Respiratory: Negative for cough, shortness of breath and wheezing.   Cardiovascular: Negative for chest pain, palpitations and leg swelling.  Gastrointestinal: Negative for abdominal pain.  Genitourinary: Negative for dysuria.  Neurological: Negative for dizziness and headaches.  Psychiatric/Behavioral: Negative for dysphoric mood.       Objective:   Physical Exam  Constitutional: He is oriented to person, place, and time. He appears well-developed and well-nourished.  HENT:  Right Ear: External ear normal.  Left Ear: External ear normal.  Mouth/Throat: Oropharynx is clear and moist.       Nonspecific mucosal swelling bilaterally. No polyps. No purulent secretions  Cardiovascular: Normal rate and regular rhythm.   Pulmonary/Chest: Effort normal and breath sounds normal. No respiratory distress. He has no wheezes. He has no rales.  Musculoskeletal: He exhibits no edema.  Neurological: He is alert and oriented to person, place, and time.  Skin:       No recurrent growth at this time R frontal scalp.          Assessment & Plan:  #1 hyperlipidemia. With history of CAD strongly advise to get back on simvastatin 40 mg daily with  prescription written. #2 history of depression stable refill Paxil 20 mg daily for one year #3 frequent rhinitis, allergic versus nonallergic. Flonase 1-2 sprays per nostril daily with caution for possible nosebleed #4 recent basal cell CA scalp removed and doing well with no recurrence thus far

## 2010-10-11 ENCOUNTER — Telehealth: Payer: Self-pay | Admitting: Internal Medicine

## 2010-10-11 NOTE — Telephone Encounter (Signed)
Spoke with patient's wife and he is not having regular bowel movements. She states he went 4 days without a bowel movement and then after using a suppository, he had a bowel movement. Now it has been 3 days and he has not had a bowel movement. When asked what regimen he is taking for his bowels, she reports he is taking his Probiotic daily but not using Metamucil daily and uses Miralax off and on. She states she has given him another suppository this afternoon about 30 minutes ago with no results yet. She will try a dose of Miralax now and will get him back on the regimen Dr. Juanda Chance had recommended. She will call on Monday with an update on how this is working.

## 2010-10-11 NOTE — Telephone Encounter (Signed)
Reviewed and agree.

## 2010-10-22 ENCOUNTER — Telehealth: Payer: Self-pay | Admitting: *Deleted

## 2010-10-22 NOTE — Telephone Encounter (Signed)
OK to refer.  Will be happy to sign any orders.

## 2010-10-22 NOTE — Telephone Encounter (Signed)
Morrie Sheldon, Hospice nurse [phone]) called, pt has been referred to Hospice.  Hospice requesting order for them to evaluate and admit if eligible for parkinson's.

## 2010-10-23 NOTE — Telephone Encounter (Signed)
Hospice Nurse Morrie Sheldon was given verbal order to evaluate and treat pt.

## 2010-10-29 ENCOUNTER — Telehealth: Payer: Self-pay | Admitting: *Deleted

## 2010-10-29 NOTE — Telephone Encounter (Signed)
Primarily FYI:  kathy from hospice called and states [name] was not admitted to services.  Dr Otelia Sergeant referred him to [facility] for home health services for physical therapy and home health.. Any questions you can call kathy at hospice [phone]

## 2010-10-31 NOTE — Telephone Encounter (Signed)
I am send this to you nancy

## 2010-10-31 NOTE — Telephone Encounter (Signed)
That is hospice services that he was not admitted to-[facility] taking care of needs

## 2010-10-31 NOTE — Telephone Encounter (Signed)
My understanding is pt was not admitted to Hospice, perhaps did not qualify for services.  Dr Otelia Sergeant referred to [facility] for services needed FYI

## 2010-12-05 LAB — CBC
HCT: 34.4 — ABNORMAL LOW
Hemoglobin: 11.4 — ABNORMAL LOW
MCV: 92.5
Platelets: 217
RBC: 3.72 — ABNORMAL LOW
WBC: 8.8

## 2010-12-05 LAB — CULTURE, BLOOD (ROUTINE X 2): Culture: NO GROWTH

## 2010-12-05 LAB — BASIC METABOLIC PANEL
Calcium: 8.8
GFR calc Af Amer: >60
GFR calc non Af Amer: >60
Potassium: 3.8
Sodium: 139

## 2010-12-05 LAB — DIFFERENTIAL
Eosinophils Relative: 5
Lymphocytes Relative: 11 — ABNORMAL LOW
Lymphs Abs: 1
Monocytes Relative: 12

## 2010-12-05 LAB — POCT I-STAT, CHEM 8
BUN: 23
Creatinine, Ser: 1.3
Glucose, Bld: 93
Sodium: 140
TCO2: 27

## 2010-12-06 LAB — COMPREHENSIVE METABOLIC PANEL
ALT: 8
AST: 25
Albumin: 3.7
Alkaline Phosphatase: 81
Potassium: 4
Sodium: 139
Total Protein: 7.3

## 2010-12-06 LAB — CBC
HCT: 35.8 — ABNORMAL LOW
Hemoglobin: 12 — ABNORMAL LOW
MCHC: 33.6
MCV: 92.6
RBC: 3.87 — ABNORMAL LOW

## 2010-12-09 ENCOUNTER — Other Ambulatory Visit: Payer: Self-pay | Admitting: *Deleted

## 2010-12-09 MED ORDER — PANTOPRAZOLE SODIUM 40 MG PO TBEC: 40.0000 mg | DELAYED_RELEASE_TABLET | Freq: Every day | ORAL | Status: DC

## 2010-12-13 ENCOUNTER — Ambulatory Visit: Payer: Medicare Other

## 2010-12-17 ENCOUNTER — Ambulatory Visit (INDEPENDENT_AMBULATORY_CARE_PROVIDER_SITE_OTHER): Payer: Medicare Other

## 2010-12-17 DIAGNOSIS — Z23 Encounter for immunization: Secondary | ICD-10-CM

## 2010-12-17 LAB — BASIC METABOLIC PANEL
BUN: 10
BUN: 17
BUN: 41 — ABNORMAL HIGH
CO2: 22
Calcium: 8.5
Calcium: 8.5
Calcium: 9.4
Chloride: 106
Chloride: 102
Creatinine, Ser: 1.18
Creatinine, Ser: 1.42
Creatinine, Ser: 1.21
GFR calc Af Amer: >60
GFR calc non Af Amer: 55 — ABNORMAL LOW
GFR calc non Af Amer: 58 — ABNORMAL LOW
Glucose, Bld: 112 — ABNORMAL HIGH
Glucose, Bld: 89
Sodium: 138

## 2010-12-17 LAB — CROSSMATCH: ABO/RH(D): A POS

## 2010-12-17 LAB — CBC
HCT: 33 — ABNORMAL LOW
Hemoglobin: 8.2 — ABNORMAL LOW
MCHC: 34.1
MCV: 93.7
MCV: 92.8
Platelets: 171
Platelets: 177
RBC: 3.84 — ABNORMAL LOW
RDW: 13.7
RDW: 15.3
RDW: 15.6 — ABNORMAL HIGH
WBC: 7.5
WBC: 7.1

## 2010-12-17 LAB — HEMOGLOBIN AND HEMATOCRIT, BLOOD
HCT: 26.8 — ABNORMAL LOW
HCT: 32.1 — ABNORMAL LOW
HCT: 32.6 — ABNORMAL LOW
Hemoglobin: 11.1 — ABNORMAL LOW
Hemoglobin: 11.4 — ABNORMAL LOW
Hemoglobin: 9.3 — ABNORMAL LOW

## 2010-12-17 LAB — APTT: aPTT: 29

## 2010-12-19 LAB — CBC
HCT: 35.5 — ABNORMAL LOW
Hemoglobin: 12 — ABNORMAL LOW
MCHC: 34
MCV: 94
RBC: 3.77 — ABNORMAL LOW
WBC: 7.9

## 2010-12-19 LAB — BASIC METABOLIC PANEL
CO2: 25
Chloride: 103
GFR calc Af Amer: 46 — ABNORMAL LOW
Potassium: 5.4 — ABNORMAL HIGH
Sodium: 135

## 2011-01-01 ENCOUNTER — Ambulatory Visit (INDEPENDENT_AMBULATORY_CARE_PROVIDER_SITE_OTHER): Payer: Medicare Other | Admitting: Family Medicine

## 2011-01-01 ENCOUNTER — Encounter: Payer: Self-pay | Admitting: Family Medicine

## 2011-01-01 DIAGNOSIS — G20 Parkinson's disease: Secondary | ICD-10-CM

## 2011-01-01 DIAGNOSIS — C4491 Basal cell carcinoma of skin, unspecified: Secondary | ICD-10-CM

## 2011-01-01 NOTE — Progress Notes (Signed)
  Subjective:    Patient ID: [name], male    DOB: 07-14-1929, 77 y.o.   MRN: [id number]  HPI  Patient seen for medical followup. History of basal cell skin cancer removed in June of this year. No signs of recurrence.  Apparently, Medicare denied his coverage for excision of skin cancer right for head. We have been unable to verify at this point while this was denied. He had history of nodular growth with fairly rapid growth over a few months. Biopsy-proven basal cell.  We stated in note prior to removal concern for probable basal cell so there is no logical reason why this should have been denied.  Parkinson's disease. No recent falls. Ambulating with walker.   Review of Systems  Constitutional: Negative for fever and chills.  Respiratory: Negative for cough and shortness of breath.   Cardiovascular: Negative for chest pain.  Skin: Negative for rash.  Neurological: Negative for dizziness and headaches.       Objective:   Physical Exam  Constitutional: He appears well-developed.  Cardiovascular: Normal rate and regular rhythm.   Pulmonary/Chest: Effort normal and breath sounds normal. No respiratory distress. He has no wheezes. He has no rales.  Skin:       No signs of recurrence skin lesion right forehead. He has some scar tissue which is flat in that region.          Assessment & Plan:  #1 basal cell skin cancer right for head. No signs of recurrence. Reassurance given. #2 Parkinson's disease. #3 health maintenance. Flu vaccine has been given. Plan followup in 6 months and repeat lipids and labs then

## 2011-02-10 ENCOUNTER — Ambulatory Visit (INDEPENDENT_AMBULATORY_CARE_PROVIDER_SITE_OTHER): Payer: Medicare Other | Admitting: Cardiovascular Disease

## 2011-02-10 ENCOUNTER — Encounter: Payer: Self-pay | Admitting: Cardiovascular Disease

## 2011-02-10 VITALS — BP 153/79 | HR 63 | Ht 72.0 in | Wt 217.1 lb

## 2011-02-10 DIAGNOSIS — R0989 Other specified symptoms and signs involving the circulatory and respiratory systems: Secondary | ICD-10-CM

## 2011-02-10 DIAGNOSIS — I2510 Atherosclerotic heart disease of native coronary artery without angina pectoris: Secondary | ICD-10-CM

## 2011-02-10 DIAGNOSIS — R0600 Dyspnea, unspecified: Secondary | ICD-10-CM | POA: Insufficient documentation

## 2011-02-10 NOTE — Assessment & Plan Note (Signed)
Will get echo to assess LVEF and exclude valvular heart disease.

## 2011-02-10 NOTE — Progress Notes (Signed)
History of Present Illness: 77 yo WM with history of CAD, Parkinsons, GI bleeding, HLD,, HTN and paroxysmal atrial fibrillation  here today for cardiac follow up. He has been followed in the past by Dr. Juanda Chance.  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 He had a Taxus drug eluting stent placed in the mid  LAD in 2005 and had an atherectomy procedure with [facility] of the diagonal branch at the same time per Dr. Juanda Chance. Marland Kitchen He's done well from this since that time with no chest pain or palpitations. He last saw Dr. Juanda Chance in December 2011. His past history is also significant for Parkinson's and he has a brain stimulator for this and is followed by Dr. Sandria Manly. He also has had toe amputations for osteomyelitis. He has a history of a GI bleed and had to stop his Plavix for this. In July 2011 he had a lumbar laminectomy for spinal stenosis and was in rehabilitation and then a skilled nursing facility after that. He had some postural hypotension then and was put on midodrine.   He has had some symptoms of intermittent shortness of breath not related to exertion. This was mentioned last year at the visit with Dr. Juanda Chance. It has increased slightly in frequency. He's had no chest pain or palpitations.   Past Medical History  Diagnosis Date  . HYPERLIPIDEMIA-MIXED 02/10/2008  . PARKINSON'S DISEASE 02/10/2008  . HYPERTENSION, BENIGN 02/09/2009  . VENOUS INSUFFICIENCY 02/10/2008  . GASTROINTESTINAL HEMORRHAGE, HX OF 02/10/2008  . ANEMIA 04/29/2010  . FIBRILLATION, ATRIAL 04/29/2010  . VENTRAL HERNIA 04/29/2010  . DEGENERATIVE JOINT DISEASE 04/29/2010  . Gastric ulcer   . Osteomyelitis   . Positive PPD   . Internal hemorrhoids   . Hyperplastic colon polyp 2004  . CAD (coronary artery disease)     23.0 x 24 mm Taxus DES mid LAD 2005, DCA diagonal branch 2005    Past Surgical History  Procedure Date  . Replacement total knee 2002    right  . Nose surgery   . Ankle surgery     left  . Back surgery 2006   lumbar stenosis X 2  . Toe amputation     right foot  . Cataract extraction   . Carpal tunnel release     left, open  . Coronary angioplasty with stent placement   . Tonsillectomy   . Appendectomy     Current Outpatient Prescriptions  Medication Sig Dispense Refill  . amantadine (SYMMETREL) 100 MG capsule Take 100 mg by mouth 2 (two) times daily.        Marland Kitchen aspirin 81 MG tablet Take 81 mg by mouth daily.        . carbidopa-levodopa (SINEMET) 25-250 MG per tablet Take 1 tablet by mouth. 1/2 tab by mouth 6 times a day       . fluticasone (FLONASE) 50 MCG/ACT nasal spray Place 1 spray into the nose daily.  16 g  6  . HYDROcodone-acetaminophen (VICODIN) 2.5-500 MG per tablet Take 1 tablet by mouth every 6 (six) hours as needed. May take up to 6 a day as needed       . L-Methylfolate-B6-B12 (METANX) 3-35-2 MG TABS Take by mouth daily.        . methocarbamol (ROBAXIN) 500 MG tablet Take 500 mg by mouth 4 (four) times daily. As needed for muscle spasms       . midodrine (PROAMATINE) 5 MG tablet Take 5 mg by mouth 2 (two) times daily.        Marland Kitchen  pantoprazole (PROTONIX) 40 MG tablet Take 1 tablet (40 mg total) by mouth daily.  30 tablet  3  . PARoxetine (PAXIL) 20 MG tablet Take 1 tablet (20 mg total) by mouth every morning.  30 tablet  11  . pramipexole (MIRAPEX) 0.5 MG tablet Take 0.5 mg by mouth. One tab by mouth 6 times a day       . Probiotic Product (PROBIOTIC FORMULA) CAPS Take 1 capsule by mouth daily.        . psyllium (METAMUCIL) 58.6 % powder Take 1 packet by mouth daily.        . simvastatin (ZOCOR) 40 MG tablet Take 1 tablet (40 mg total) by mouth at bedtime.  90 tablet  3    Allergies  Allergen Reactions  . Morphine     History   Social History  . Marital Status: Married    Spouse Name: N/A    Number of Children: 4  . Years of Education: N/A   Occupational History  . retired Building surveyor professor    Social History Main Topics  . Smoking status: Never Smoker   . Smokeless  tobacco: Never Used  . Alcohol Use: No  . Drug Use: No  . Sexually Active: Not on file   Other Topics Concern  . Not on file   Social History Narrative  . No narrative on file    Family History  Problem Relation Age of Onset  . Parkinsonism Sister   . Diabetes Brother   . Crohn's disease Daughter   . Heart disease Paternal Grandfather   . Colon cancer Neg Hx     Review of Systems:  As stated in the HPI and otherwise negative.   BP 153/79  Pulse 63  Ht 6' (1.829 m)  Wt 217 lb 1.9 oz (98.485 kg)  BMI 29.45 kg/m2  Physical Examination: General: Well developed, well nourished, NAD HEENT: OP clear, mucus membranes moist SKIN: warm, dry. No rashes. Neuro: No focal deficits Musculoskeletal: Muscle strength 5/5 all ext Psychiatric: Mood and affect normal Neck: No JVD, no carotid bruits, no thyromegaly, no lymphadenopathy. Lungs:Clear bilaterally, no wheezes, rhonci, crackles Cardiovascular: Regular rate and rhythm. No murmurs, gallops or rubs. Abdomen:Soft. Bowel sounds present. Non-tender.  Extremities: No lower extremity edema. Pulses are 2 + in the bilateral DP/PT.  EKG: Unable to obtain today secondary to inferference from brain stimulator.

## 2011-02-10 NOTE — Patient Instructions (Signed)
Your physician recommends that you schedule a follow-up appointment in:   6 weeks.   Your physician has requested that you have an echocardiogram. Echocardiography is a painless test that uses sound waves to create images of your heart. It provides your doctor with information about the size and shape of your heart and how well your heart's chambers and valves are working. This procedure takes approximately one hour. There are no restrictions for this procedure.    

## 2011-02-10 NOTE — Assessment & Plan Note (Signed)
Stable. No chest pain. Continue current therapy.

## 2011-02-13 ENCOUNTER — Ambulatory Visit: Payer: Medicare Other | Admitting: Family Medicine

## 2011-02-13 ENCOUNTER — Encounter: Payer: Self-pay | Admitting: Family Medicine

## 2011-02-13 ENCOUNTER — Ambulatory Visit (INDEPENDENT_AMBULATORY_CARE_PROVIDER_SITE_OTHER): Payer: Medicare Other | Admitting: Family Medicine

## 2011-02-13 VITALS — BP 120/70 | Temp 97.4°F | Wt 217.0 lb

## 2011-02-13 DIAGNOSIS — L570 Actinic keratosis: Secondary | ICD-10-CM

## 2011-02-13 NOTE — Progress Notes (Signed)
  Subjective:    Patient ID: [name], male    DOB: 26-Jul-1929, 77 y.o.   MRN: [id number]  HPI  Patient seen with scaly somewhat pruritic spots on his forehead. Previous history of basal cell skin cancer on forehead. These appear somewhat different. No bleeding. Has not tried anything topically.  Chronic problems include history of Parkinson's disease, GERD, depression, hyperlipidemia. No recent falls. Overall doing fairly well  Review of Systems  Constitutional: Negative for fever, chills, appetite change and unexpected weight change.  Respiratory: Negative for cough.   Cardiovascular: Negative for chest pain.  Neurological: Negative for dizziness and headaches.       Objective:   Physical Exam  Constitutional: He appears well-developed and well-nourished.  Cardiovascular: Normal rate and regular rhythm.   Pulmonary/Chest: Effort normal and breath sounds normal. No respiratory distress. He has no wheezes. He has no rales.  Skin:       Patient has scattered slightly raised scaly fairly well demarcated skin lesions across his for head. These are compatible with actinic keratoses.  no fleshy nodular lesions          Assessment & Plan:  Actinic keratoses. Discussed treatment options. Discussed benefits and risk of liquid nitrogen and patient consents. Treated with liquid nitrogen without difficulty.  Touch base one month if not resolving

## 2011-02-17 ENCOUNTER — Encounter (HOSPITAL_COMMUNITY): Payer: Self-pay | Admitting: Respiratory Therapy

## 2011-02-17 ENCOUNTER — Other Ambulatory Visit: Payer: Self-pay | Admitting: Neurosurgery

## 2011-02-21 ENCOUNTER — Encounter (HOSPITAL_COMMUNITY)
Admission: RE | Admit: 2011-02-21 | Discharge: 2011-02-21 | Disposition: A | Discharge status: Home / Self Care | Payer: Medicare Other | Source: Ambulatory Visit | Attending: Neurosurgery | Admitting: Neurosurgery

## 2011-02-21 ENCOUNTER — Ambulatory Visit (HOSPITAL_COMMUNITY)
Admission: RE | Admit: 2011-02-21 | Discharge: 2011-02-21 | Disposition: A | Discharge status: Home / Self Care | Payer: Medicare Other | Source: Ambulatory Visit | Attending: Anesthesiology | Admitting: Anesthesiology

## 2011-02-21 ENCOUNTER — Encounter (HOSPITAL_COMMUNITY): Payer: Self-pay

## 2011-02-21 DIAGNOSIS — Z01818 Encounter for other preprocedural examination: Secondary | ICD-10-CM | POA: Insufficient documentation

## 2011-02-21 DIAGNOSIS — Z01812 Encounter for preprocedural laboratory examination: Secondary | ICD-10-CM | POA: Insufficient documentation

## 2011-02-21 DIAGNOSIS — R0602 Shortness of breath: Secondary | ICD-10-CM | POA: Insufficient documentation

## 2011-02-21 HISTORY — DX: Calculus of kidney: N20.0

## 2011-02-21 HISTORY — DX: Shortness of breath: R06.02

## 2011-02-21 HISTORY — DX: Respiratory tuberculosis unspecified: A15.9

## 2011-02-21 HISTORY — DX: Gastro-esophageal reflux disease without esophagitis: K21.9

## 2011-02-21 LAB — CBC
HCT: 34.1 % — ABNORMAL LOW (ref 39.0–52.0)
MCH: 31.4 pg (ref 26.0–34.0)
MCV: 93.9 fL (ref 78.0–100.0)
Platelets: 230 10*3/uL (ref 150–400)
RBC: 3.63 MIL/uL — ABNORMAL LOW (ref 4.22–5.81)

## 2011-02-21 LAB — BASIC METABOLIC PANEL
CO2: 25 mEq/L (ref 19–32)
Calcium: 9.5 mg/dL (ref 8.4–10.5)
Glucose, Bld: 80 mg/dL (ref 70–99)
Sodium: 142 mEq/L (ref 135–145)

## 2011-02-21 NOTE — Pre-Procedure Instructions (Signed)
20 ARCHIMEDES HAROLD  02/21/2011   Your procedure is scheduled on:  02/27/11  Report to [facility] at 115 pm  Call this number if you have problems the morning of surgery: [phone]   Remember:   Do not eat food:After Midnight.  May have clear liquids: up to 4 Hours before arrival.  Clear liquids include soda, tea, black coffee, apple or grape juice, broth.  Take these medicines the morning of surgery with A SIP OF WATERpaxil amantidine, carbidopa-levadopa, flonase protonix,mirapex       STOP ASA TODAY   Do not wear jewelry, make-up or nail polish.  Do not wear lotions, powders, or perfumes. You may wear deodorant.  Do not shave 48 hours prior to surgery.  Do not bring valuables to the hospital.  Contacts, dentures or bridgework may not be worn into surgery.  Leave suitcase in the car. After surgery it may be brought to your room.  For patients admitted to the hospital, checkout time is 11:00 AM the day of discharge.   Patients discharged the day of surgery will not be allowed to drive home.  Name and phone number of your driver:grace [phone]  Special Instructions: CHG Shower Use Special Wash: 1/2 bottle night before surgery and 1/2 bottle morning of surgery.   Please read over the following fact sheets that you were given: Pain Booklet, Coughing and Deep Breathing, MRSA Information and Surgical Site Infection Prevention

## 2011-02-21 NOTE — Progress Notes (Signed)
Ekg, note from dr Sanjuana Kava in epic

## 2011-02-24 ENCOUNTER — Telehealth: Payer: Self-pay

## 2011-02-24 NOTE — Consult Note (Addendum)
Anesthesia:  Patient is a 77 year old male scheduled for battery replacement X 2 for a deep brain stimulator on 02/27/11.  His history includes Parkinson's Disease, CAD s/p Taxus stent to the mid LAD and atherectomy to a DIAG branch in 2005, right Great toe amp secondary to osteo, HLD, HTN but was actually started on Midodrine last summer due to postural hypotension, paroxysmal afib, and GIB when on Plavix.  In July of 2011 he tolerated a lumbar laminectomy L1-3 (Dr. Otelia Sergeant).    He was last seen by Dr. Clifton James on 02/10/11.  He described a slight increase in frequency of intermittent SOB, but denied CP or palpitations.  Dr. Clifton James has ordered a follow-up echo which is scheduled for 03/24/11.   His last EKG was done then and could not be interpreted due to artifact created by his brain stimulator.  His last echo was done on 01/25/07 showing normal LV systolic function with EF 65%, mild diastolic dysfunction, mild to moderated MR, mild TR, trivial PR, LA moderately dilated, and RA mildly dilated.  CXR showed no acute findings.  Labs showed an elevated Cr of 1.75, up from 1.2 in December of last year.  His BUN was 32.  H/H 11.4/34.1.  He is not currently taking an ARB/ACEI or diuretic.  I have ordered a repeat BMET for the morning of surgery.  I reviewed his labs and history with Anesthesiologist Dr. Chaney Malling.  He agrees with rechecking BMET preoperatively.  Also he would like Cardiology input in regards to whether [name] follow-up echo can be done post operatively as scheduled (versus needing to reschedule since he is now for a procedure under [place]).  I spoke with Trish with [facility] and was instructed to leave a message with the [facility] message nurse [phone]), since Dr. Clifton James is currently on vacation, to see if one of his partners would be willing to give his/her input on this matter.  Addendum:  02/24/11 1500 I received a phone call back from Merkel, message nurse, at [facility].  She says that Dr. Clifton James will be back to work on 02/26/11.  She will have him review this when he returns and have their office contact me with his response.  I left a message updating Graciella Belton at [place].   Addendum:  02/26/11 1128 Dr. Clifton James says [name]' echo does not need to be done prior to this procedure.

## 2011-02-24 NOTE — Telephone Encounter (Signed)
Shonna Chock PA called, stating patient is scheduled to have a battery replacement deep brain simulator 02/27/11.by Dr.Stern,wants to know if patient needs echo that is scheduled 03/24/11 done before surgery.Fowarded to [facility] ,call Revonda Standard back at [phone] or [phone].

## 2011-02-26 MED ORDER — CEFAZOLIN SODIUM-DEXTROSE 2-3 GM-% IV SOLR
2.0000 g | INTRAVENOUS | Status: AC
  Administered 2011-02-27: 2 g via INTRAVENOUS
  Filled 2011-02-26: qty 50

## 2011-02-26 NOTE — Telephone Encounter (Signed)
His echo can be done after his other procedure. Thanks, chris

## 2011-02-26 NOTE — Telephone Encounter (Signed)
Spoke with Revonda Standard and gave her information from Dr. Clifton James

## 2011-02-26 NOTE — H&P (Signed)
Micah B. Drum #[id number] DOB:  06-23-1929 02/17/2011:  Addendum: We spoke with [employer] and they actually are quite concerned about the declining voltage in the implantable pulse generator and do think that he should have his batteries revised.  Based on this recommendation, I am doing so and setting this up for the coming week.          Danae Orleans. Venetia Maxon, M.D./aft  NEUROSURGICAL CONSULTATION   Renn B. Timko  DOB: 11-Apr-2029  #[id number]    Jul 17, 2005   HISTORY OF PRESENT ILLNESS:  Grantley Savage is a 77 year old, right-handed, retired Professor of Biology who presents at the request of Dr. Jac Canavan for neurosurgical consultation for implantable pulse generator depletion.  He has undergone subthalamic DBS placement for Parkinson's, which was done in [place], [place] in April of 2002.  He underwent DBS battery implantation with bilateral Soletra batteries; however, one was positioned in the left interclavicular site and the other was placed in the subcostal site. The subcostal site is for the right DBS electrode, the left interclavicular is for the left DBC electrode.  This was done on 06/09/00.  He comes in today with worsening symptoms and with battery depletion.    Mr. Teall currently says the right side of his body is worse.  He was initially diagnosed with Parkinson's in 1987.  He said his left side was worse, however, now his right side is worse, and he has difficulty with tremors and rigidity since 12/2004 at which point he underwent back surgery, which was done by Dr. Otelia Sergeant. He says that his rigidity has been worse since then.  He has been taking Mirapex .5 mg. every three hours and Carbidopa 25/250 also every three hours.  He has had heart stents placed in 2005 and had a right total knee arthroplasty.  He has had an exploratory laparotomy, appendectomy, and left heel spur removed, in addition to recent lumbar surgery by Dr. Vira Browns.  He had a previous surgery on his low back in June of 2005.  He  says he has been working in an exercise group with Bonney Leitz twice a week and has noted a lot more difficulty with his Parkinson's.    He comes in today specifically for battery depletion for the need of battery revision.    REVIEW OF SYSTEMS:   A detailed Review of Systems sheet was reviewed with the patient.  Pertinent positives include under eyes - wears glasses, cataracts; under ear, nose, throat and mouth - nasal congestion/drainage, inability to smell, sinus problems; under cardiovascular - swelling in feet and legs; under musculoskeletal - back pain, arthritis, neck pain.  All other systems are negative; this includes Constitutional symptoms, Endocrine, Respiratory, Gastrointestinal, Genitourinary, Integumentary & Breast, Neurologic, Psychiatric, Hematologic/Lymphatic, Allergic/Immunologic.    PAST MEDICAL HISTORY:      Current Medical Conditions:    Previously described.   Shravan B. Oltmann  DOB: 11-Apr-2029  #[id number]     Jul 17, 2005  Page Two     Prior Operations and Hospitalizations:   Previously described.      Medications and Allergies:  Current medications - Mirapex .5 mg. po q3h, Carbidopa  of 25/250 po q3h, Aspirin 81 mg. qd, Plavix 75 mg. qd, Paroxetine 20 mg. qd, Lipitor 80 mg. qd, Furosemide 40 mg. qd, Potassium 20 mg. qd, Propoxyphene 100/650 prn.  His ALLERGIES ARE POSSIBLY TO NITROGLYCERIN AND MORPHINE.      Height and Weight:     He is 6' tall,  218 lbs.    SOCIAL HISTORY:    He is a nonsmoker, nondrinker, no history of substance abuse.    PHYSICAL EXAMINATION:      General Appearance:   On examination today, Mr. Vangorder is a pleasant, cooperative, older man in no acute distress.      Blood Pressure, Pulse:     His blood pressure is 122/82 in the left arm seated.  Heart rate is 72 and regular.      HEENT - normocephalic, atraumatic.  The pupils are equal, round and reactive to light.  The extraocular muscles are intact.  Sclerae - white.  Conjunctiva - pink.  Oropharynx  benign.  Uvula midline.     Neck - there are no masses, meningismus, deformities, tracheal deviation, jugular vein distention or carotid bruits.  There is normal cervical range of motion.  Spurlings' test is negative without reproducible radicular pain turning the patient's head to either side.  Lhermitte's sign is not present with axial compression.      Respiratory - there is normal respiratory effort with good intercostal function.  Lungs are clear to auscultation.  There are no rales, rhonchi or wheezes.      Cardiovascular - the heart has regular rate and rhythm to auscultation.  No murmurs are appreciated.  There is no extremity edema, cyanosis or clubbing.  There are palpable pedal pulses.      Abdomen - soft, nontender, no hepatosplenomegaly appreciated or masses.  There are active bowel sounds.  No guarding or rebound.  He has an interclavicular IBG implantable pulse generator and a subcostal implantable pulse generator, both on the left, and there appears to be no complications related to those.    Jerimah B. Haskew  DOB: August 30, 2029  #[id number]     Jul 17, 2005  Page Three     Musculoskeletal Examination - the patient is able to walk about the examining room with a normal, casual, heel and toe gait.    NEUROLOGICAL EXAMINATION: The patient is oriented to time, person and place and has good recall of both recent and remote memory with normal attention span and concentration.  The patient speaks with clear and fluent speech and exhibits normal language function and appropriate fund of knowledge.  He is awake, alert and conversant.      Cranial Nerve Examination - pupils are equal, round and reactive to light.  Extraocular movements are full.  Visual fields are full to confrontational testing.  Facial sensation and facial movement are symmetric and intact.  Hearing is intact to finger rub.  Palate is upgoing.  Shoulder shrug is symmetric.  Tongue protrudes in the midline.      Motor Examination -  He has a wide-based and unsteady gait.  He has a Parkinsonian tremor, right greater than left and increased tone bilaterally, also right greater than left.         Deep Tendon Reflexes - reflexes are 1 and symmetric in the upper and lower extremities bilaterally.         Cerebellar Examination - normal coordination in upper and lower extremities and normal rapid alternating movements.  Romberg test is negative.    IMPRESSION AND RECOMMENDATIONS: Tell Rozelle is a 77 year old man with Parkinson's. He has a depleted implantable pulse generator.  We got no signal from the left interclavicular IBG. We do get some transmission from the left subcostal IBG.  At this point both of these pulse generators should be changed. We would like to have  him assessed by Dr. Juanda Chance to make sure there is no problem with him coming off the Plavix. I would like him to be off Plavix for at least ten days before going ahead with surgery.    [facility]    Danae Orleans. Venetia Maxon, M.D.  JDS:aft cc: Dr. Jac Canavan

## 2011-02-27 ENCOUNTER — Encounter (HOSPITAL_COMMUNITY)
Admission: RE | Disposition: A | Discharge status: Home / Self Care | Payer: Self-pay | Source: Ambulatory Visit | Attending: Neurosurgery

## 2011-02-27 ENCOUNTER — Encounter (HOSPITAL_COMMUNITY): Payer: Self-pay | Admitting: *Deleted

## 2011-02-27 ENCOUNTER — Ambulatory Visit (HOSPITAL_COMMUNITY)
Admission: RE | Admit: 2011-02-27 | Discharge: 2011-02-27 | Disposition: A | Discharge status: Home / Self Care | Payer: Medicare Other | Source: Ambulatory Visit | Attending: Neurosurgery | Admitting: Neurosurgery

## 2011-02-27 ENCOUNTER — Encounter (HOSPITAL_COMMUNITY): Payer: Self-pay | Admitting: Vascular Surgery

## 2011-02-27 ENCOUNTER — Ambulatory Visit (HOSPITAL_COMMUNITY): Payer: Medicare Other | Admitting: Vascular Surgery

## 2011-02-27 DIAGNOSIS — G20 Parkinson's disease: Secondary | ICD-10-CM | POA: Insufficient documentation

## 2011-02-27 DIAGNOSIS — G20A1 Parkinson's disease without dyskinesia, without mention of fluctuations: Secondary | ICD-10-CM | POA: Insufficient documentation

## 2011-02-27 HISTORY — PX: SUBTHALAMIC STIMULATOR BATTERY REPLACEMENT: SHX5405

## 2011-02-27 LAB — BASIC METABOLIC PANEL
BUN: 25 mg/dL — ABNORMAL HIGH (ref 6–23)
CO2: 27 mEq/L (ref 19–32)
Chloride: 104 mEq/L (ref 96–112)
GFR calc non Af Amer: 40 mL/min — ABNORMAL LOW (ref >90)
Glucose, Bld: 89 mg/dL (ref 70–99)
Potassium: 4.1 mEq/L (ref 3.5–5.1)

## 2011-02-27 SURGERY — SUBTHALAMIC STIMULATOR BATTERY REPLACEMENT
Anesthesia: Monitor Anesthesia Care | Site: Chest | Laterality: Left | Wound class: Clean

## 2011-02-27 MED ORDER — SODIUM CHLORIDE 0.9 % IV SOLN
INTRAVENOUS | Status: AC
  Filled 2011-02-27: qty 500

## 2011-02-27 MED ORDER — PROPOFOL 10 MG/ML IV EMUL
INTRAVENOUS | Status: DC | PRN
  Administered 2011-02-27: 75 ug/kg/min via INTRAVENOUS

## 2011-02-27 MED ORDER — BACITRACIN 50000 UNITS IM SOLR
INTRAMUSCULAR | Status: AC
  Filled 2011-02-27: qty 50000

## 2011-02-27 MED ORDER — LIDOCAINE-EPINEPHRINE 1 %-1:100000 IJ SOLN
INTRAMUSCULAR | Status: DC | PRN
  Administered 2011-02-27: 10 mL

## 2011-02-27 MED ORDER — FENTANYL CITRATE 0.05 MG/ML IJ SOLN
25.0000 ug | INTRAMUSCULAR | Status: DC | PRN
  Administered 2011-02-27: 25 ug via INTRAVENOUS

## 2011-02-27 MED ORDER — FENTANYL CITRATE 0.05 MG/ML IJ SOLN
INTRAMUSCULAR | Status: AC
  Filled 2011-02-27: qty 2

## 2011-02-27 MED ORDER — PROMETHAZINE HCL 25 MG/ML IJ SOLN: 6.2500 mg | INTRAMUSCULAR | Status: DC | PRN

## 2011-02-27 MED ORDER — BUPIVACAINE HCL (PF) 0.5 % IJ SOLN
INTRAMUSCULAR | Status: DC | PRN
  Administered 2011-02-27: 10 mL

## 2011-02-27 MED ORDER — LACTATED RINGERS IV SOLN
INTRAVENOUS | Status: DC | PRN
  Administered 2011-02-27: 16:00:00 via INTRAVENOUS

## 2011-02-27 MED ORDER — FENTANYL CITRATE 0.05 MG/ML IJ SOLN
INTRAMUSCULAR | Status: DC | PRN
  Administered 2011-02-27: 25 ug via INTRAVENOUS
  Administered 2011-02-27: 50 ug via INTRAVENOUS
  Administered 2011-02-27: 25 ug via INTRAVENOUS

## 2011-02-27 MED ORDER — SODIUM CHLORIDE 0.9 % IR SOLN
Status: DC | PRN
  Administered 2011-02-27: 1000 mL

## 2011-02-27 MED ORDER — MIDAZOLAM HCL 5 MG/5ML IJ SOLN
INTRAMUSCULAR | Status: DC | PRN
  Administered 2011-02-27: 1 mg via INTRAVENOUS

## 2011-02-27 MED ORDER — SODIUM CHLORIDE 0.9 % IR SOLN
Status: DC | PRN
  Administered 2011-02-27: 17:00:00

## 2011-02-27 MED ORDER — MEPERIDINE HCL 25 MG/ML IJ SOLN: 6.2500 mg | INTRAMUSCULAR | Status: DC | PRN

## 2011-02-27 MED ORDER — MIDAZOLAM HCL 2 MG/2ML IJ SOLN: 0.5000 mg | Freq: Once | INTRAMUSCULAR | Status: DC | PRN

## 2011-02-27 SURGICAL SUPPLY — 63 items
APL SKNCLS STERI-STRIP NONHPOA (GAUZE/BANDAGES/DRESSINGS) ×2
BAG DECANTER FOR FLEXI CONT (MISCELLANEOUS) ×2 IMPLANT
BENZOIN TINCTURE PRP APPL 2/3 (GAUZE/BANDAGES/DRESSINGS) ×2 IMPLANT
BLADE SURG 10 STRL SS (BLADE) ×2 IMPLANT
BLADE SURG 15 STRL LF DISP TIS (BLADE) ×1 IMPLANT
BLADE SURG 15 STRL SS (BLADE) ×2
BOOT SUTURE AID YELLOW STND (SUTURE) ×2 IMPLANT
CANISTER SUCTION 2500CC (MISCELLANEOUS) ×2 IMPLANT
CLOTH BEACON ORANGE TIMEOUT ST (SAFETY) ×2 IMPLANT
CORDS BIPOLAR (ELECTRODE) ×2 IMPLANT
DRAPE INCISE IOBAN 66X45 STRL (DRAPES) ×2 IMPLANT
DRAPE ORTHO SPLIT 77X108 STRL (DRAPES) ×4
DRAPE PED LAPAROTOMY (DRAPES) ×2 IMPLANT
DRAPE POUCH INSTRU U-SHP 10X18 (DRAPES) ×2 IMPLANT
DRAPE SURG ORHT 6 SPLT 77X108 (DRAPES) IMPLANT
DRESSING TELFA 8X3 (GAUZE/BANDAGES/DRESSINGS) ×3 IMPLANT
DRSG OPSITE 4X5.5 SM (GAUZE/BANDAGES/DRESSINGS) IMPLANT
ELECT CAUTERY BLADE 6.4 (BLADE) ×2 IMPLANT
GAUZE SPONGE 4X4 12PLY STRL LF (GAUZE/BANDAGES/DRESSINGS) ×1 IMPLANT
GAUZE SPONGE 4X4 16PLY XRAY LF (GAUZE/BANDAGES/DRESSINGS) ×2 IMPLANT
GLOVE BIO SURGEON STRL SZ8 (GLOVE) ×2 IMPLANT
GLOVE BIOGEL PI IND STRL 7.5 (GLOVE) IMPLANT
GLOVE BIOGEL PI IND STRL 8 (GLOVE) ×1 IMPLANT
GLOVE BIOGEL PI IND STRL 8.5 (GLOVE) ×1 IMPLANT
GLOVE BIOGEL PI INDICATOR 7.5 (GLOVE) ×1
GLOVE BIOGEL PI INDICATOR 8 (GLOVE) ×1
GLOVE BIOGEL PI INDICATOR 8.5 (GLOVE) ×1
GLOVE ECLIPSE 6.5 STRL STRAW (GLOVE) ×1 IMPLANT
GLOVE ECLIPSE 7.5 STRL STRAW (GLOVE) ×4 IMPLANT
GLOVE EXAM NITRILE LRG STRL (GLOVE) IMPLANT
GLOVE EXAM NITRILE MD LF STRL (GLOVE) IMPLANT
GLOVE EXAM NITRILE XL STR (GLOVE) IMPLANT
GLOVE EXAM NITRILE XS STR PU (GLOVE) IMPLANT
GOWN BRE IMP SLV AUR LG STRL (GOWN DISPOSABLE) IMPLANT
GOWN BRE IMP SLV AUR XL STRL (GOWN DISPOSABLE) ×3 IMPLANT
GOWN STRL REIN 2XL LVL4 (GOWN DISPOSABLE) ×2 IMPLANT
KIT BASIN OR (CUSTOM PROCEDURE TRAY) ×2 IMPLANT
KIT ROOM TURNOVER OR (KITS) ×2 IMPLANT
NDL HYPO 25X1 1.5 SAFETY (NEEDLE) ×1 IMPLANT
NEEDLE HYPO 25X1 1.5 SAFETY (NEEDLE) ×2 IMPLANT
NEUROSTIM OCTOPOLAR ~~LOC~~ 2.4X2.2 (Neuro Prosthesis/Implant) ×2 IMPLANT
NEUROSTIM PROGRAMMER 2.2X3.7 (Neuro Prosthesis/Implant) ×1 IMPLANT
NS IRRIG 1000ML POUR BTL (IV SOLUTION) ×2 IMPLANT
PACK SURGICAL SETUP 50X90 (CUSTOM PROCEDURE TRAY) ×2 IMPLANT
PAD ARMBOARD 7.5X6 YLW CONV (MISCELLANEOUS) ×6 IMPLANT
PATIENT PROGRAMMER ×1 IMPLANT
PENCIL BUTTON HOLSTER BLD 10FT (ELECTRODE) ×2 IMPLANT
SPONGE GAUZE 4X4 12PLY (GAUZE/BANDAGES/DRESSINGS) ×1 IMPLANT
STAPLER SKIN PROX WIDE 3.9 (STAPLE) ×2 IMPLANT
STOCKINETTE 4X48 STRL (DRAPES) ×2 IMPLANT
STRIP CLOSURE SKIN 1/2X4 (GAUZE/BANDAGES/DRESSINGS) ×3 IMPLANT
SUT ETHILON 3 0 PS 1 (SUTURE) ×4 IMPLANT
SUT SILK 2 0 TIES 10X30 (SUTURE) ×2 IMPLANT
SUT VIC AB 2-0 CP2 18 (SUTURE) ×2 IMPLANT
SUT VIC AB 3-0 SH 8-18 (SUTURE) ×3 IMPLANT
SYR BULB 3OZ (MISCELLANEOUS) ×2 IMPLANT
SYR CONTROL 10ML LL (SYRINGE) ×2 IMPLANT
TAPE HYPAFIX 4 X10 (GAUZE/BANDAGES/DRESSINGS) ×1 IMPLANT
TOWEL OR 17X24 6PK STRL BLUE (TOWEL DISPOSABLE) ×2 IMPLANT
TOWEL OR 17X26 10 PK STRL BLUE (TOWEL DISPOSABLE) ×3 IMPLANT
TUBE CONNECTING 12X1/4 (SUCTIONS) ×2 IMPLANT
UNDERPAD 30X30 INCONTINENT (UNDERPADS AND DIAPERS) ×2 IMPLANT
WATER STERILE IRR 1000ML POUR (IV SOLUTION) ×2 IMPLANT

## 2011-02-27 NOTE — Progress Notes (Signed)
Pt and wife given discharge instructions with verbalized understanding. Iv DC'd. Pt discharged from PACU via w/c home with wife

## 2011-02-27 NOTE — Preoperative (Signed)
Beta Blockers   Reason not to administer Beta Blockers:Not Applicable 

## 2011-02-27 NOTE — Anesthesia Postprocedure Evaluation (Signed)
  Anesthesia Post-op Note  Patient: [name]  Procedure(s) Performed:  SUBTHALAMIC STIMULATOR BATTERY REPLACEMENT - DBS battery replacment x 2 - Left Chest and Abdomen  Patient Location: PACU  Anesthesia Type: MAC  Level of Consciousness: awake  Airway and Oxygen Therapy: Patient Spontanous Breathing  Post-op Pain: mild  Post-op Assessment: Post-op Vital signs reviewed  Post-op Vital Signs: stable  Complications: No apparent anesthesia complications

## 2011-02-27 NOTE — Anesthesia Preprocedure Evaluation (Addendum)
Anesthesia Evaluation  Patient identified by MRN, date of birth, ID band Patient awake    Reviewed: Allergy & Precautions, H&P , NPO status , Patient's Chart, lab work & pertinent test results, reviewed documented beta blocker date and time   Airway Mallampati: II      Dental   Pulmonary shortness of breath and with exertion,  clear to auscultation        Cardiovascular hypertension, + CAD - dysrhythmias Regular Normal    Neuro/Psych Negative Neurological ROS     GI/Hepatic Neg liver ROS, PUD, GERD-  Controlled,  Endo/Other  Negative Endocrine ROS  Renal/GU      Musculoskeletal negative musculoskeletal ROS (+)   Abdominal   Peds  Hematology negative hematology ROS (+)   Anesthesia Other Findings   Reproductive/Obstetrics                          Anesthesia Physical Anesthesia Plan  ASA: III  Anesthesia Plan: MAC   Post-op Pain Management:    Induction: Intravenous  Airway Management Planned: Mask  Additional Equipment:   Intra-op Plan:   Post-operative Plan:   Informed Consent: I have reviewed the patients History and Physical, chart, labs and discussed the procedure including the risks, benefits and alternatives for the proposed anesthesia with the patient or authorized representative who has indicated his/her understanding and acceptance.     Plan Discussed with:   Anesthesia Plan Comments:        Anesthesia Quick Evaluation

## 2011-02-27 NOTE — Op Note (Signed)
02/27/2011  6:41 PM  PATIENT:  [name]  77 y.o. male  PRE-OPERATIVE DIAGNOSIS:  paralysis agitans  POST-OPERATIVE DIAGNOSIS:  paralysis agitans  PROCEDURE:  Procedure(s): Bilateral SUBTHALAMIC STIMULATOR BATTERY REPLACEMENT  SURGEON:  Surgeon(s): Dorian Heckle, MD  PHYSICIAN ASSISTANT:   ASSISTANTS: Poteat, RN   ANESTHESIA:   MAC with sedation  EBL:  Total I/O In: 750 [I.V.:750] Out: 10 [Blood:10]  BLOOD ADMINISTERED:none  DRAINS: none   LOCAL MEDICATIONS USED:  LIDOCAINE 10CC  SPECIMEN:  No Specimen  DISPOSITION OF SPECIMEN:  N/A  COUNTS:  YES  TOURNIQUET:  * No tourniquets in log *  DICTATION: Patient was brought to the operating room and following initiation of intravenous sedation his left upper chest and left abdomen were prepped with Betadine scrub and paint and then draped in a sterile fashion. Areas of previous incision were infiltrated with local lidocaine and then both stimulator sites were opened with 10 blade and the batteries were removed and replaced and hooked to the extensions appropriately with torque connections applied. The implantable pulse generator is were then placed back in their subcutaneous pockets and the incisions were closed with 201 3-0 Vicryl stitches and dressed with sterile occlusive dressings. The patient was taken to recovery having tolerated procedure well. Counts were correct at the end of the case.  PLAN OF CARE: Discharge to home after PACU  PATIENT DISPOSITION:  PACU - hemodynamically stable.   Delay start of Pharmacological VTE agent (>24hrs) due to surgical blood loss or risk of bleeding:  YES

## 2011-02-27 NOTE — Transfer of Care (Signed)
Immediate Anesthesia Transfer of Care Note  Patient: [name]  Procedure(s) Performed:  SUBTHALAMIC STIMULATOR BATTERY REPLACEMENT - DBS battery replacment x 2  Patient Location: PACU  Anesthesia Type: MAC  Level of Consciousness: awake, alert , oriented and patient cooperative  Airway & Oxygen Therapy: Patient Spontanous Breathing and Patient connected to face mask oxygen  Post-op Assessment: Report given to PACU RN  Post vital signs: Reviewed and stable  Complications: No apparent anesthesia complications

## 2011-02-27 NOTE — Interval H&P Note (Signed)
History and Physical Interval Note:  02/27/2011 6:40 PM  [name]  has presented today for surgery, with the diagnosis of paralysis agitans  The various methods of treatment have been discussed with the patient and family. After consideration of risks, benefits and other options for treatment, the patient has consented to  Procedure(s): SUBTHALAMIC STIMULATOR BATTERY REPLACEMENT as a surgical intervention .  The patients' history has been reviewed, patient examined, no change in status, stable for surgery.  I have reviewed the patients' chart and labs.  Questions were answered to the patient's satisfaction.     Shakeira Rhee D  Date of Initial H&P: 02/17/11  History reviewed, patient examined, no change in status, stable for surgery.

## 2011-02-28 ENCOUNTER — Encounter (HOSPITAL_COMMUNITY): Payer: Self-pay | Admitting: Neurosurgery

## 2011-03-24 ENCOUNTER — Ambulatory Visit (HOSPITAL_COMMUNITY): Payer: Medicare Other | Attending: Internal Medicine | Admitting: Radiology

## 2011-03-24 DIAGNOSIS — R0609 Other forms of dyspnea: Secondary | ICD-10-CM | POA: Insufficient documentation

## 2011-03-24 DIAGNOSIS — R0600 Dyspnea, unspecified: Secondary | ICD-10-CM

## 2011-03-24 DIAGNOSIS — I252 Old myocardial infarction: Secondary | ICD-10-CM | POA: Insufficient documentation

## 2011-03-24 DIAGNOSIS — I10 Essential (primary) hypertension: Secondary | ICD-10-CM | POA: Insufficient documentation

## 2011-03-24 DIAGNOSIS — I2510 Atherosclerotic heart disease of native coronary artery without angina pectoris: Secondary | ICD-10-CM

## 2011-03-24 DIAGNOSIS — G20 Parkinson's disease: Secondary | ICD-10-CM | POA: Insufficient documentation

## 2011-03-24 DIAGNOSIS — E785 Hyperlipidemia, unspecified: Secondary | ICD-10-CM | POA: Insufficient documentation

## 2011-03-24 DIAGNOSIS — R0989 Other specified symptoms and signs involving the circulatory and respiratory systems: Secondary | ICD-10-CM | POA: Insufficient documentation

## 2011-03-24 DIAGNOSIS — I4891 Unspecified atrial fibrillation: Secondary | ICD-10-CM | POA: Insufficient documentation

## 2011-03-24 DIAGNOSIS — G20A1 Parkinson's disease without dyskinesia, without mention of fluctuations: Secondary | ICD-10-CM | POA: Insufficient documentation

## 2011-03-31 ENCOUNTER — Encounter: Payer: Self-pay | Admitting: Cardiovascular Disease

## 2011-03-31 ENCOUNTER — Ambulatory Visit (INDEPENDENT_AMBULATORY_CARE_PROVIDER_SITE_OTHER): Payer: Medicare Other | Admitting: Cardiovascular Disease

## 2011-03-31 ENCOUNTER — Telehealth: Payer: Self-pay | Admitting: *Deleted

## 2011-03-31 VITALS — BP 110/72 | HR 96 | Ht 73.0 in | Wt 215.0 lb

## 2011-03-31 DIAGNOSIS — I2510 Atherosclerotic heart disease of native coronary artery without angina pectoris: Secondary | ICD-10-CM

## 2011-03-31 DIAGNOSIS — I10 Essential (primary) hypertension: Secondary | ICD-10-CM

## 2011-03-31 MED ORDER — OMEPRAZOLE 40 MG PO CPDR: 40.0000 mg | DELAYED_RELEASE_CAPSULE | Freq: Every day | ORAL | Status: DC

## 2011-03-31 NOTE — Assessment & Plan Note (Signed)
BP well controlled.

## 2011-03-31 NOTE — Progress Notes (Signed)
History of Present Illness: 78 yo WM with history of CAD, Parkinsons, GI bleeding, HLD, HTN and paroxysmal atrial fibrillation here today for cardiac follow up. He has been followed in the past by Dr. Juanda Chance. 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 He had a Taxus drug eluting stent placed in the mid LAD in 2005 and had an atherectomy procedure with [facility] of the diagonal branch at the same time per Dr. Juanda Chance.  He's done well from this since that time with no chest pain or palpitations. He last saw Dr. Juanda Chance in December 2011. I met him in December 2012 His past history is also significant for Parkinson's and he has a brain stimulator for this and is followed by Dr. Sandria Manly. He also has had toe amputations for osteomyelitis. He has a history of a GI bleed and had to stop his Plavix for this. In July 2011 he had a lumbar laminectomy for spinal stenosis and was in rehabilitation and then a skilled nursing facility after that. He had some postural hypotension then and was put on midodrine. I ordered an echo which was overall normal on 03/24/11.   He is here for follow up. He has been doing well. He has had some symptoms of intermittent shortness of breath not related to exertion. This is at baseline. He's had no chest pain or palpitations. He has seen his neurologist and he was started Klonopin.    Past Medical History  Diagnosis Date  . HYPERLIPIDEMIA-MIXED 02/10/2008  . PARKINSON'S DISEASE 02/10/2008  . HYPERTENSION, BENIGN 02/09/2009  . GASTROINTESTINAL HEMORRHAGE, HX OF 02/10/2008  . ANEMIA 04/29/2010  . FIBRILLATION, ATRIAL 04/29/2010  . VENTRAL HERNIA 04/29/2010  . DEGENERATIVE JOINT DISEASE 04/29/2010  . Gastric ulcer   . Positive PPD   . Internal hemorrhoids   . Hyperplastic colon polyp 2004  . CAD (coronary artery disease)     23.0 x 24 mm Taxus DES mid LAD 2005, DCA diagonal branch 2005  . VENOUS INSUFFICIENCY 02/10/2008    rt  . Shortness of breath   . Osteomyelitis   . Calculus of kidney    . Tuberculosis     78 yrs old  . GERD (gastroesophageal reflux disease)     ulcers hx    Past Surgical History  Procedure Date  . Replacement total knee 2002    right  . Nose surgery   . Ankle surgery     left  . Toe amputation     right foot  . Cataract extraction   . Carpal tunnel release     left, open rt  . Tonsillectomy   . Appendectomy   . Coronary angioplasty with stent placement   . Back surgery 2006     back x3  . Eye muscle surgery   . Deep brain stimulator placement 02    batteries changed 07  . Subthalamic stimulator battery replacement 02/27/2011    Procedure: SUBTHALAMIC STIMULATOR BATTERY REPLACEMENT;  Surgeon: Dorian Heckle, MD;  Location: [facility];  Service: Neurosurgery;  Laterality: Left;  DBS battery replacment x 2 - Left Chest and Abdomen    Current Outpatient Prescriptions  Medication Sig Dispense Refill  . amantadine (SYMMETREL) 100 MG capsule Take 100 mg by mouth 2 (two) times daily.        Marland Kitchen amoxicillin (AMOXIL) 500 MG capsule For use when he goes to his Dentist only      . aspirin 81 MG tablet Take 81 mg by mouth daily.        Marland Kitchen  B Complex Vitamins (B COMPLEX PO) Take 1 tablet by mouth 2 (two) times daily.       . carbidopa-levodopa (SINEMET) 25-250 MG per tablet Take 1 tablet by mouth. 1/2 tab by mouth 6 times a day      . clonazePAM (KLONOPIN) 0.5 MG tablet Take 0.5 mg by mouth at bedtime as needed.      . fluticasone (FLONASE) 50 MCG/ACT nasal spray Place 1 spray into the nose as needed.      . methocarbamol (ROBAXIN) 500 MG tablet Take 500 mg by mouth 4 (four) times daily. As needed for muscle spasms       . midodrine (PROAMATINE) 5 MG tablet Take 5 mg by mouth 2 (two) times daily.        . pantoprazole (PROTONIX) 40 MG tablet Take 1 tablet (40 mg total) by mouth daily.  30 tablet  3  . PARoxetine (PAXIL) 20 MG tablet Take 1 tablet (20 mg total) by mouth every morning.  30 tablet  11  . Polyethylene Glycol 3350 (MIRALAX PO) Take by mouth  as needed.        . pramipexole (MIRAPEX) 0.5 MG tablet Take 0.5 mg by mouth. One tab by mouth 6 times a day      . Probiotic Product (PROBIOTIC FORMULA) CAPS Take 1 capsule by mouth daily.        . simvastatin (ZOCOR) 40 MG tablet Take 1 tablet (40 mg total) by mouth at bedtime.  90 tablet  3    Allergies  Allergen Reactions  . Morphine   . Risperidone And Related     History   Social History  . Marital Status: Married    Spouse Name: N/A    Number of Children: 4  . Years of Education: N/A   Occupational History  . retired Building surveyor professor    Social History Main Topics  . Smoking status: Never Smoker   . Smokeless tobacco: Never Used  . Alcohol Use: No  . Drug Use: No  . Sexually Active: Not on file   Other Topics Concern  . Not on file   Social History Narrative  . No narrative on file    Family History  Problem Relation Age of Onset  . Parkinsonism Sister   . Diabetes Brother   . Crohn's disease Daughter   . Heart disease Paternal Grandfather   . Colon cancer Neg Hx     Review of Systems:  As stated in the HPI and otherwise negative.   BP 110/72  Pulse 96  Ht 6\' 1"  (1.854 m)  Wt 215 lb (97.523 kg)  BMI 28.37 kg/m2  Physical Examination: General: Well developed, well nourished, NAD HEENT: OP clear, mucus membranes moist SKIN: warm, dry. No rashes. Neuro: No focal deficits Musculoskeletal: Muscle strength 5/5 all ext Psychiatric: Mood and affect normal Neck: No JVD, no carotid bruits, no thyromegaly, no lymphadenopathy. Lungs:Clear bilaterally, no wheezes, rhonci, crackles Cardiovascular: Regular rate and rhythm. No murmurs, gallops or rubs. Abdomen:Soft. Bowel sounds present. Non-tender.  Extremities: No lower extremity edema. Pulses are 2 + in the bilateral DP/PT.  EKG:03/31/11: Sinus rhythm, rate 96 bpm. 1st degree AV block. Poor R wave progression through precordial leads.   Echo 03/24/11:  Left ventricle: The cavity size was normal. Wall  thickness was increased in a pattern of mild LVH. Systolic function was normal. The estimated ejection fraction was in the range of 55% to 60%. Wall motion was normal; there were no regional  wall motion abnormalities. Doppler parameters are consistent with abnormal left ventricular relaxation (grade 1 diastolic dysfunction). - Mitral valve: Mild regurgitation. - Left atrium: The atrium was mildly dilated.

## 2011-03-31 NOTE — Patient Instructions (Signed)
Your physician wants you to follow-up in: 12 months.  You will receive a reminder letter in the mail two months in advance. If you don't receive a letter, please call our office to schedule the follow-up appointment.  Your physician recommends that you continue on your current medications as directed. Please refer to the Current Medication list given to you today.   

## 2011-03-31 NOTE — Assessment & Plan Note (Signed)
Stable. Con

## 2011-03-31 NOTE — Telephone Encounter (Signed)
We have gotten a note from [facility] that insurance will no longer cover patient's pantoprazole. Patient's wife states that she does not believe patient has tried omeprazole or prevacid in the past (both of which are preferred medications), he has only tried Nexium. I have advised her that we will switch him to Prilosec at this time and if he begins having any symptoms, he is to call us back. Wife verbalizes understanding (patient asked me to discuss this issue with his wife).

## 2011-04-25 ENCOUNTER — Telehealth: Payer: Self-pay | Admitting: Family Medicine

## 2011-04-25 NOTE — Telephone Encounter (Signed)
I spoke with pt wife, "he had a really rough week and I think it's time for Hospice, I'd like to make appt for myself to discuss further."  Dr Caryl Never informed

## 2011-04-25 NOTE — Telephone Encounter (Signed)
Pt would like nancy to return her call concerning ?referral to hospice

## 2011-04-29 ENCOUNTER — Ambulatory Visit (INDEPENDENT_AMBULATORY_CARE_PROVIDER_SITE_OTHER): Payer: Medicare Other | Admitting: Family Medicine

## 2011-04-29 ENCOUNTER — Encounter: Payer: Self-pay | Admitting: Family Medicine

## 2011-04-29 DIAGNOSIS — G20A1 Parkinson's disease without dyskinesia, without mention of fluctuations: Secondary | ICD-10-CM

## 2011-04-29 DIAGNOSIS — R6251 Failure to thrive (child): Secondary | ICD-10-CM

## 2011-04-29 DIAGNOSIS — R627 Adult failure to thrive: Secondary | ICD-10-CM

## 2011-04-29 DIAGNOSIS — G20 Parkinson's disease: Secondary | ICD-10-CM

## 2011-04-29 NOTE — Progress Notes (Signed)
Subjective:    Patient ID: [name], male    DOB: August 19, 1929, 77 y.o.   MRN: [id number]  HPI  Patient's wife here basically to consult regarding her husband. Her husband has advanced Parkinson's disease, history of CAD, atrial fibrillation, degenerative arthritis and history of depression. He had a particularly bad week recently and they think this may been related to clonazepam which was prescribed by neurologist. Also he has electrical brain stimulator and this may have been turned off. In any event, wife is concerned regarding multiple changes recently. Decreased sleep. Inability to dress. Difficulty getting up and changing positions. Patient now unable to administer medications. They had remote consult with hospice about 2 years ago he was not felt to be a candidate. She would like to reexplore this option now.  He is also falling more recently. Uses walker most the time. Continues to be followed by neurology and neurosurgery. No recent fevers. Appetite fair. Couple of recent falls no head injury.  Past Medical History  Diagnosis Date  . HYPERLIPIDEMIA-MIXED 02/10/2008  . PARKINSON'S DISEASE 02/10/2008  . HYPERTENSION, BENIGN 02/09/2009  . GASTROINTESTINAL HEMORRHAGE, HX OF 02/10/2008  . ANEMIA 04/29/2010  . FIBRILLATION, ATRIAL 04/29/2010  . VENTRAL HERNIA 04/29/2010  . DEGENERATIVE JOINT DISEASE 04/29/2010  . Gastric ulcer   . Positive PPD   . Internal hemorrhoids   . Hyperplastic colon polyp 2004  . CAD (coronary artery disease)     23.0 x 24 mm Taxus DES mid LAD 2005, DCA diagonal branch 2005  . VENOUS INSUFFICIENCY 02/10/2008    rt  . Shortness of breath   . Osteomyelitis   . Calculus of kidney   . Tuberculosis     78 yrs old  . GERD (gastroesophageal reflux disease)     ulcers hx   Past Surgical History  Procedure Date  . Replacement total knee 2002    right  . Nose surgery   . Ankle surgery     left  . Toe amputation     right foot  . Cataract extraction   . Carpal  tunnel release     left, open rt  . Tonsillectomy   . Appendectomy   . Coronary angioplasty with stent placement   . Back surgery 2006     back x3  . Eye muscle surgery   . Deep brain stimulator placement 02    batteries changed 07  . Subthalamic stimulator battery replacement 02/27/2011    Procedure: SUBTHALAMIC STIMULATOR BATTERY REPLACEMENT;  Surgeon: Dorian Heckle, MD;  Location: [facility];  Service: Neurosurgery;  Laterality: Left;  DBS battery replacment x 2 - Left Chest and Abdomen    reports that he has never smoked. He has never used smokeless tobacco. He reports that he does not drink alcohol or use illicit drugs. family history includes Crohn's disease in his daughter; Diabetes in his brother; Heart disease in his paternal grandfather; and Parkinsonism in his sister.  There is no history of Colon cancer. Allergies  Allergen Reactions  . Morphine   . Risperidone And Related       Review of Systems Consult with wife    Objective:   Physical Exam Not done today as this was a consultation visit only       Assessment & Plan:  Consultation visit. Patient has Parkinson's disease and multiple other problems as above. Recent decline. Progressive difficulty with ADLs.  He is scheduled for physical in a couple of weeks. We'll discuss possible  hospice consult at that time.  Approximately 25 minutes spent in consultation.

## 2011-04-30 ENCOUNTER — Ambulatory Visit: Payer: Medicare Other | Admitting: Family Medicine

## 2011-05-21 ENCOUNTER — Ambulatory Visit (INDEPENDENT_AMBULATORY_CARE_PROVIDER_SITE_OTHER): Payer: Medicare Other | Admitting: Family Medicine

## 2011-05-21 ENCOUNTER — Encounter: Payer: Self-pay | Admitting: Family Medicine

## 2011-05-21 ENCOUNTER — Other Ambulatory Visit: Payer: Self-pay | Admitting: Neurosurgery

## 2011-05-21 ENCOUNTER — Encounter (HOSPITAL_COMMUNITY): Payer: Self-pay | Admitting: *Deleted

## 2011-05-21 VITALS — BP 138/82 | HR 80 | Resp 12 | Ht 69.0 in | Wt 209.0 lb

## 2011-05-21 DIAGNOSIS — Z0000 Encounter for general adult medical examination without abnormal findings: Secondary | ICD-10-CM

## 2011-05-21 DIAGNOSIS — L989 Disorder of the skin and subcutaneous tissue, unspecified: Secondary | ICD-10-CM

## 2011-05-21 DIAGNOSIS — Z9181 History of falling: Secondary | ICD-10-CM

## 2011-05-21 DIAGNOSIS — I4891 Unspecified atrial fibrillation: Secondary | ICD-10-CM

## 2011-05-21 DIAGNOSIS — E785 Hyperlipidemia, unspecified: Secondary | ICD-10-CM

## 2011-05-21 DIAGNOSIS — R5381 Other malaise: Secondary | ICD-10-CM

## 2011-05-21 DIAGNOSIS — G20 Parkinson's disease: Secondary | ICD-10-CM

## 2011-05-21 DIAGNOSIS — R5383 Other fatigue: Secondary | ICD-10-CM

## 2011-05-21 DIAGNOSIS — I10 Essential (primary) hypertension: Secondary | ICD-10-CM

## 2011-05-21 LAB — CBC WITH DIFFERENTIAL/PLATELET
Basophils Relative: 0.4 % (ref 0.0–3.0)
Eosinophils Relative: 4.4 % (ref 0.0–5.0)
Hemoglobin: 11.8 g/dL — ABNORMAL LOW (ref 13.0–17.0)
Lymphocytes Relative: 13.4 % (ref 12.0–46.0)
Monocytes Relative: 8.7 % (ref 3.0–12.0)
Neutro Abs: 4.8 10*3/uL (ref 1.4–7.7)
Neutrophils Relative %: 73.1 % (ref 43.0–77.0)
RBC: 3.82 Mil/uL — ABNORMAL LOW (ref 4.22–5.81)
WBC: 6.6 10*3/uL (ref 4.5–10.5)

## 2011-05-21 LAB — HEPATIC FUNCTION PANEL
ALT: 7 U/L (ref 0–53)
Albumin: 4.4 g/dL (ref 3.5–5.2)
Total Bilirubin: 0.5 mg/dL (ref 0.3–1.2)
Total Protein: 7.9 g/dL (ref 6.0–8.3)

## 2011-05-21 LAB — BASIC METABOLIC PANEL
GFR: 50.62 mL/min — ABNORMAL LOW (ref >60.00)
Glucose, Bld: 82 mg/dL (ref 70–99)
Potassium: 4.6 mEq/L (ref 3.5–5.1)
Sodium: 140 mEq/L (ref 135–145)

## 2011-05-21 LAB — LIPID PANEL
HDL: 47.1 mg/dL (ref >39.00)
Triglycerides: 69 mg/dL (ref 0.0–149.0)
VLDL: 13.8 mg/dL (ref 0.0–40.0)

## 2011-05-21 LAB — TSH: TSH: 1.44 u[IU]/mL (ref 0.35–5.50)

## 2011-05-21 MED ORDER — CEFAZOLIN SODIUM-DEXTROSE 2-3 GM-% IV SOLR
2.0000 g | INTRAVENOUS | Status: AC
  Administered 2011-05-22: 2 g via INTRAVENOUS
  Filled 2011-05-21: qty 50

## 2011-05-21 NOTE — Patient Instructions (Signed)
Set up dermatology appointment with Dr Hortense Ramal for L facial lesion

## 2011-05-21 NOTE — Progress Notes (Signed)
Subjective:    Patient ID: [name], male    DOB: 02-07-1930, 78 y.o.   MRN: [id number]  HPI  Patient here for Medicare wellness exam and medical followup. He has history of advanced Parkinson's disease. Electrical brain stimulator. Followed closely by neurology. He also has history of CAD, hypertension, past history of atrial fibrillation, hyperlipidemia, history of depression, and osteoarthritis involving multiple joints. He had one recent fall about 5 days ago. No head injury. Generally poor stability. Uses walker at all times. No recent physical therapy.  Increasing difficulties with transfers and some ADLs.  L facial  Lesion for several weeks to months-scabbed and not healing.  Past Medical History  Diagnosis Date  . HYPERLIPIDEMIA-MIXED 02/10/2008  . PARKINSON'S DISEASE 02/10/2008  . HYPERTENSION, BENIGN 02/09/2009  . GASTROINTESTINAL HEMORRHAGE, HX OF 02/10/2008  . ANEMIA 04/29/2010  . FIBRILLATION, ATRIAL 04/29/2010  . VENTRAL HERNIA 04/29/2010  . DEGENERATIVE JOINT DISEASE 04/29/2010  . Gastric ulcer   . Positive PPD   . Internal hemorrhoids   . Hyperplastic colon polyp 2004  . CAD (coronary artery disease)     23.0 x 24 mm Taxus DES mid LAD 2005, DCA diagonal branch 2005  . VENOUS INSUFFICIENCY 02/10/2008    rt  . Shortness of breath   . Osteomyelitis   . Calculus of kidney   . Tuberculosis     78 yrs old  . GERD (gastroesophageal reflux disease)     ulcers hx   Past Surgical History  Procedure Date  . Replacement total knee 2002    right  . Nose surgery   . Ankle surgery     left  . Toe amputation     right foot  . Cataract extraction   . Carpal tunnel release     left, open rt  . Tonsillectomy   . Appendectomy   . Coronary angioplasty with stent placement   . Back surgery 2006     back x3  . Deep brain stimulator placement 02    batteries changed 07  . Subthalamic stimulator battery replacement 02/27/2011    Procedure: SUBTHALAMIC STIMULATOR BATTERY  REPLACEMENT;  Surgeon: Dorian Heckle, MD;  Location: [facility];  Service: Neurosurgery;  Laterality: Left;  DBS battery replacment x 2 - Left Chest and Abdomen  . Eye muscle surgery     bilat    reports that he has never smoked. He has never used smokeless tobacco. He reports that he does not drink alcohol or use illicit drugs. family history includes Crohn's disease in his daughter; Diabetes in his brother; Heart disease in his paternal grandfather; and Parkinsonism in his sister.  There is no history of Colon cancer. Allergies  Allergen Reactions  . Morphine   . Risperidone And Related      1.  Risk factors based on Past Medical , Social, and Family history reviewed and as indicated elsewhere 2.  Limitations in physical activities very limited in activity. High risk of falls 3.  Depression/mood long history of recurrent depression currently stable on Paxil 4.  Hearing no hearing deficits 5.  ADLs becoming more dependent with things like dressing and transfer positions 6.  Cognitive function (orientation to time and place, language, writing, speech,memory) mild short-term memory issues. Language intact 7.  Home Safety safety discussed. We are concerned regarding his risk of falls. Physical therapy referral 8.  Height, weight, and visual acuity. 3 pound weight loss due to his efforts recently. Vision stable. Sees ophthalmologist regularly  9.  Counseling diet discussed. Fall risk reduction discussed 10. Recommendation of preventive services. Immunizations up to date. They decided against further colonoscopies 11. Labs based on risk factors CBC, TSH, basic metabolic panel, hepatic panel, and lipid panel 12. Care Plan as above.    Review of Systems  Constitutional: Positive for fatigue. Negative for fever, activity change and appetite change.  HENT: Negative for ear pain, congestion and trouble swallowing.   Eyes: Negative for pain and visual disturbance.  Respiratory: Negative for  cough, shortness of breath and wheezing.   Cardiovascular: Negative for chest pain and palpitations.  Gastrointestinal: Negative for nausea, vomiting, abdominal pain, diarrhea, constipation, blood in stool, abdominal distention and rectal pain.  Genitourinary: Negative for dysuria, hematuria and testicular pain.  Musculoskeletal: Negative for joint swelling and arthralgias.  Skin: Negative for rash.  Neurological: Positive for weakness. Negative for dizziness, syncope and headaches.  Hematological: Negative for adenopathy. Does not bruise/bleed easily.  Psychiatric/Behavioral: Negative for confusion and dysphoric mood.       Objective:   Physical Exam  Constitutional: He is oriented to person, place, and time. He appears well-developed and well-nourished.  HENT:  Mouth/Throat: Oropharynx is clear and moist.       Electrical brain stimulator. No evidence for head trauma  Cerumen removed left canal with curet without difficulty  Neck: Neck supple. No thyromegaly present.  Cardiovascular: Normal rate and regular rhythm.   Pulmonary/Chest: Effort normal and breath sounds normal. No respiratory distress. He has no wheezes. He has no rales.  Abdominal: Soft. He exhibits mass. He exhibits no distension. There is no tenderness. There is no rebound and no guarding.       Umbilical hernia soft and nontender  Musculoskeletal: He exhibits no edema.  Lymphadenopathy:    He has no cervical adenopathy.  Neurological: He is alert and oriented to person, place, and time. No cranial nerve deficit.  Skin:       Thick scaly somewhat ulcerated lesion left face about 1 cm by 8mm.  Psychiatric: He has a normal mood and affect. His behavior is normal. Judgment and thought content normal.          Assessment & Plan:  #1 wellness exam. Labs reviewed and ordered. Immunizations are up to date. #2 high risk for falls. Physical therapy referral. Dan Humphreys use all times  #3 hyperlipidemia. Recheck lipid and  hepatic panel  #4 history of depression stable.  continue Paxil 20 mg daily #5 skin lesion L cheek.  ?squamous cell cancer.  They will set up follow up with his dermatologist.

## 2011-05-22 ENCOUNTER — Encounter (HOSPITAL_COMMUNITY): Payer: Self-pay | Admitting: Anesthesiology

## 2011-05-22 ENCOUNTER — Encounter (HOSPITAL_COMMUNITY)
Admission: RE | Disposition: A | Discharge status: Skilled Nursing Facility | Payer: Self-pay | Source: Ambulatory Visit | Attending: Neurosurgery

## 2011-05-22 ENCOUNTER — Ambulatory Visit (HOSPITAL_COMMUNITY): Payer: Medicare Other | Admitting: Anesthesiology

## 2011-05-22 ENCOUNTER — Inpatient Hospital Stay (HOSPITAL_COMMUNITY)
Admission: RE | Admit: 2011-05-22 | Discharge: 2011-05-30 | DRG: 027 | Disposition: A | Discharge status: Skilled Nursing Facility | Payer: Medicare Other | Source: Ambulatory Visit | Attending: Neurosurgery | Admitting: Neurosurgery

## 2011-05-22 ENCOUNTER — Encounter (HOSPITAL_COMMUNITY): Payer: Self-pay | Admitting: Surgery

## 2011-05-22 DIAGNOSIS — Z9861 Coronary angioplasty status: Secondary | ICD-10-CM

## 2011-05-22 DIAGNOSIS — Y92009 Unspecified place in unspecified non-institutional (private) residence as the place of occurrence of the external cause: Secondary | ICD-10-CM

## 2011-05-22 DIAGNOSIS — G20 Parkinson's disease: Secondary | ICD-10-CM

## 2011-05-22 DIAGNOSIS — I10 Essential (primary) hypertension: Secondary | ICD-10-CM | POA: Diagnosis present

## 2011-05-22 DIAGNOSIS — Y849 Medical procedure, unspecified as the cause of abnormal reaction of the patient, or of later complication, without mention of misadventure at the time of the procedure: Secondary | ICD-10-CM

## 2011-05-22 DIAGNOSIS — Y831 Surgical operation with implant of artificial internal device as the cause of abnormal reaction of the patient, or of later complication, without mention of misadventure at the time of the procedure: Secondary | ICD-10-CM | POA: Diagnosis present

## 2011-05-22 DIAGNOSIS — I2510 Atherosclerotic heart disease of native coronary artery without angina pectoris: Secondary | ICD-10-CM | POA: Diagnosis present

## 2011-05-22 DIAGNOSIS — K219 Gastro-esophageal reflux disease without esophagitis: Secondary | ICD-10-CM | POA: Diagnosis present

## 2011-05-22 DIAGNOSIS — L270 Generalized skin eruption due to drugs and medicaments taken internally: Secondary | ICD-10-CM | POA: Diagnosis present

## 2011-05-22 DIAGNOSIS — Z79899 Other long term (current) drug therapy: Secondary | ICD-10-CM

## 2011-05-22 DIAGNOSIS — Z8611 Personal history of tuberculosis: Secondary | ICD-10-CM

## 2011-05-22 DIAGNOSIS — Y9210 Unspecified residential institution as the place of occurrence of the external cause: Secondary | ICD-10-CM | POA: Diagnosis not present

## 2011-05-22 DIAGNOSIS — G20A1 Parkinson's disease without dyskinesia, without mention of fluctuations: Secondary | ICD-10-CM | POA: Diagnosis not present

## 2011-05-22 DIAGNOSIS — E782 Mixed hyperlipidemia: Secondary | ICD-10-CM | POA: Diagnosis present

## 2011-05-22 DIAGNOSIS — T8579XA Infection and inflammatory reaction due to other internal prosthetic devices, implants and grafts, initial encounter: Secondary | ICD-10-CM

## 2011-05-22 DIAGNOSIS — Z7982 Long term (current) use of aspirin: Secondary | ICD-10-CM

## 2011-05-22 DIAGNOSIS — I4891 Unspecified atrial fibrillation: Secondary | ICD-10-CM | POA: Diagnosis present

## 2011-05-22 DIAGNOSIS — T85738A Infection and inflammatory reaction due to other nervous system device, implant or graft, initial encounter: Principal | ICD-10-CM | POA: Diagnosis present

## 2011-05-22 DIAGNOSIS — T368X5A Adverse effect of other systemic antibiotics, initial encounter: Secondary | ICD-10-CM | POA: Diagnosis not present

## 2011-05-22 HISTORY — PX: SUBTHALAMIC STIMULATOR REMOVAL: SHX5406

## 2011-05-22 LAB — GRAM STAIN

## 2011-05-22 LAB — SURGICAL PCR SCREEN: MRSA, PCR: NEGATIVE

## 2011-05-22 SURGERY — SUBTHALAMIC STIMULATOR REMOVAL
Anesthesia: Monitor Anesthesia Care | Site: Chest | Wound class: Dirty or Infected

## 2011-05-22 MED ORDER — MELOXICAM 15 MG PO TABS
15.0000 mg | ORAL_TABLET | Freq: Every day | ORAL | Status: DC
  Administered 2011-05-22 – 2011-05-30 (×9): 15 mg via ORAL
  Filled 2011-05-22 (×9): qty 1

## 2011-05-22 MED ORDER — PANTOPRAZOLE SODIUM 40 MG PO TBEC
40.0000 mg | DELAYED_RELEASE_TABLET | Freq: Every day | ORAL | Status: DC
  Administered 2011-05-23 – 2011-05-30 (×8): 40 mg via ORAL
  Filled 2011-05-22 (×4): qty 1

## 2011-05-22 MED ORDER — PHENOL 1.4 % MT LIQD: 1.0000 | OROMUCOSAL | Status: DC | PRN

## 2011-05-22 MED ORDER — KCL IN DEXTROSE-NACL 20-5-0.45 MEQ/L-%-% IV SOLN
INTRAVENOUS | Status: DC
  Administered 2011-05-22: 16:00:00 via INTRAVENOUS
  Administered 2011-05-22: 1000 mL via INTRAVENOUS
  Administered 2011-05-30: 10:00:00 via INTRAVENOUS
  Filled 2011-05-22 (×16): qty 1000

## 2011-05-22 MED ORDER — SODIUM CHLORIDE 0.9 % IJ SOLN: 3.0000 mL | INTRAMUSCULAR | Status: DC | PRN

## 2011-05-22 MED ORDER — PROBIOTIC FORMULA PO CAPS: 1.0000 | ORAL_CAPSULE | Freq: Every day | ORAL | Status: DC

## 2011-05-22 MED ORDER — VANCOMYCIN HCL 1000 MG IV SOLR
750.0000 mg | Freq: Two times a day (BID) | INTRAVENOUS | Status: DC
  Administered 2011-05-22 – 2011-05-27 (×10): 750 mg via INTRAVENOUS
  Filled 2011-05-22 (×12): qty 750

## 2011-05-22 MED ORDER — PRAMIPEXOLE DIHYDROCHLORIDE 0.25 MG PO TABS
0.5000 mg | ORAL_TABLET | Freq: Every day | ORAL | Status: DC
  Administered 2011-05-24 – 2011-05-30 (×36): 0.5 mg via ORAL
  Filled 2011-05-22 (×52): qty 2

## 2011-05-22 MED ORDER — AMANTADINE HCL 100 MG PO CAPS
100.0000 mg | ORAL_CAPSULE | Freq: Two times a day (BID) | ORAL | Status: DC
  Administered 2011-05-22 – 2011-05-30 (×16): 100 mg via ORAL
  Filled 2011-05-22 (×17): qty 1

## 2011-05-22 MED ORDER — HYDROMORPHONE HCL PF 1 MG/ML IJ SOLN: 0.2500 mg | INTRAMUSCULAR | Status: DC | PRN

## 2011-05-22 MED ORDER — MENTHOL 3 MG MT LOZG: 1.0000 | LOZENGE | OROMUCOSAL | Status: DC | PRN

## 2011-05-22 MED ORDER — MORPHINE SULFATE 2 MG/ML IJ SOLN: 0.0500 mg/kg | INTRAMUSCULAR | Status: DC | PRN

## 2011-05-22 MED ORDER — SACCHAROMYCES BOULARDII 250 MG PO CAPS
250.0000 mg | ORAL_CAPSULE | Freq: Every day | ORAL | Status: DC
  Administered 2011-05-23 – 2011-05-30 (×8): 250 mg via ORAL
  Filled 2011-05-22 (×8): qty 1

## 2011-05-22 MED ORDER — BACITRACIN 50000 UNITS IM SOLR
INTRAMUSCULAR | Status: AC
  Filled 2011-05-22: qty 1

## 2011-05-22 MED ORDER — LACTATED RINGERS IV SOLN
INTRAVENOUS | Status: DC | PRN
  Administered 2011-05-22: 07:00:00 via INTRAVENOUS

## 2011-05-22 MED ORDER — SODIUM CHLORIDE 0.9 % IR SOLN
Status: DC | PRN
  Administered 2011-05-22: 08:00:00

## 2011-05-22 MED ORDER — FLUTICASONE PROPIONATE 50 MCG/ACT NA SUSP
1.0000 | Freq: Every day | NASAL | Status: DC | PRN
  Filled 2011-05-22: qty 16

## 2011-05-22 MED ORDER — HYDROMORPHONE HCL PF 1 MG/ML IJ SOLN: 0.5000 mg | INTRAMUSCULAR | Status: DC | PRN

## 2011-05-22 MED ORDER — MIDODRINE HCL 5 MG PO TABS
5.0000 mg | ORAL_TABLET | Freq: Two times a day (BID) | ORAL | Status: DC
  Administered 2011-05-22 – 2011-05-30 (×16): 5 mg via ORAL
  Filled 2011-05-22 (×18): qty 1

## 2011-05-22 MED ORDER — B COMPLEX PO TABS: 1.0000 | ORAL_TABLET | Freq: Every day | ORAL | Status: DC

## 2011-05-22 MED ORDER — SIMVASTATIN 40 MG PO TABS
40.0000 mg | ORAL_TABLET | Freq: Every day | ORAL | Status: DC
  Administered 2011-05-22 – 2011-05-27 (×6): 40 mg via ORAL
  Filled 2011-05-22 (×8): qty 1

## 2011-05-22 MED ORDER — FENTANYL CITRATE 0.05 MG/ML IJ SOLN
INTRAMUSCULAR | Status: DC | PRN
  Administered 2011-05-22 (×2): 50 ug via INTRAVENOUS

## 2011-05-22 MED ORDER — PAROXETINE HCL 20 MG PO TABS
20.0000 mg | ORAL_TABLET | Freq: Every day | ORAL | Status: DC
  Administered 2011-05-22 – 2011-05-30 (×9): 20 mg via ORAL
  Filled 2011-05-22 (×9): qty 1

## 2011-05-22 MED ORDER — KCL IN DEXTROSE-NACL 20-5-0.45 MEQ/L-%-% IV SOLN
INTRAVENOUS | Status: AC
  Administered 2011-05-22: 1000 mL via INTRAVENOUS
  Filled 2011-05-22: qty 1000

## 2011-05-22 MED ORDER — CEFAZOLIN SODIUM 1-5 GM-% IV SOLN
1.0000 g | Freq: Three times a day (TID) | INTRAVENOUS | Status: AC
  Administered 2011-05-22 (×2): 1 g via INTRAVENOUS
  Filled 2011-05-22 (×2): qty 50

## 2011-05-22 MED ORDER — 0.9 % SODIUM CHLORIDE (POUR BTL) OPTIME
TOPICAL | Status: DC | PRN
  Administered 2011-05-22: 1000 mL

## 2011-05-22 MED ORDER — OXYCODONE-ACETAMINOPHEN 5-325 MG PO TABS
1.0000 | ORAL_TABLET | ORAL | Status: DC | PRN
Start: 2011-05-22 — End: 2011-05-30
  Administered 2011-05-26 – 2011-05-28 (×2): 2 via ORAL
  Filled 2011-05-22 (×2): qty 2

## 2011-05-22 MED ORDER — BUPIVACAINE HCL (PF) 0.25 % IJ SOLN
INTRAMUSCULAR | Status: DC | PRN
  Administered 2011-05-22: 5 mL

## 2011-05-22 MED ORDER — ONDANSETRON HCL 4 MG/2ML IJ SOLN: 4.0000 mg | INTRAMUSCULAR | Status: DC | PRN

## 2011-05-22 MED ORDER — SODIUM CHLORIDE 0.9 % IV SOLN
INTRAVENOUS | Status: AC
  Filled 2011-05-22: qty 500

## 2011-05-22 MED ORDER — CARBIDOPA-LEVODOPA 25-250 MG PO TABS
1.0000 | ORAL_TABLET | Freq: Every day | ORAL | Status: DC
  Administered 2011-05-24 – 2011-05-30 (×33): 1 via ORAL
  Filled 2011-05-22 (×52): qty 1

## 2011-05-22 MED ORDER — MUPIROCIN 2 % EX OINT
TOPICAL_OINTMENT | CUTANEOUS | Status: AC
  Filled 2011-05-22: qty 22

## 2011-05-22 MED ORDER — HYDROCODONE-ACETAMINOPHEN 5-325 MG PO TABS
1.0000 | ORAL_TABLET | ORAL | Status: DC | PRN
  Administered 2011-05-22 – 2011-05-24 (×3): 2 via ORAL
  Administered 2011-05-25 – 2011-05-26 (×2): 1 via ORAL
  Filled 2011-05-22: qty 1
  Filled 2011-05-22 (×3): qty 2
  Filled 2011-05-22: qty 1

## 2011-05-22 MED ORDER — ACETAMINOPHEN 325 MG PO TABS: 650.0000 mg | ORAL_TABLET | ORAL | Status: DC | PRN

## 2011-05-22 MED ORDER — ACETAMINOPHEN 650 MG RE SUPP: 650.0000 mg | RECTAL | Status: DC | PRN

## 2011-05-22 MED ORDER — ONDANSETRON HCL 4 MG/2ML IJ SOLN
INTRAMUSCULAR | Status: DC | PRN
  Administered 2011-05-22: 4 mg via INTRAVENOUS

## 2011-05-22 MED ORDER — GLYCOPYRROLATE 0.2 MG/ML IJ SOLN
INTRAMUSCULAR | Status: DC | PRN
  Administered 2011-05-22: 0.2 mg via INTRAVENOUS

## 2011-05-22 MED ORDER — ASPIRIN EC 81 MG PO TBEC
81.0000 mg | DELAYED_RELEASE_TABLET | Freq: Every day | ORAL | Status: DC
  Administered 2011-05-22 – 2011-05-30 (×9): 81 mg via ORAL
  Filled 2011-05-22 (×9): qty 1

## 2011-05-22 MED ORDER — PHENYLEPHRINE HCL 10 MG/ML IJ SOLN
INTRAMUSCULAR | Status: DC | PRN
  Administered 2011-05-22 (×10): 80 ug via INTRAVENOUS

## 2011-05-22 MED ORDER — VANCOMYCIN HCL IN DEXTROSE 1-5 GM/200ML-% IV SOLN: 1000.0000 mg | Freq: Two times a day (BID) | INTRAVENOUS | Status: DC

## 2011-05-22 MED ORDER — POLYETHYLENE GLYCOL 3350 17 G PO PACK
17.0000 g | PACK | Freq: Every day | ORAL | Status: DC
  Administered 2011-05-22 – 2011-05-30 (×9): 17 g via ORAL
  Filled 2011-05-22 (×9): qty 1

## 2011-05-22 MED ORDER — CARBIDOPA-LEVODOPA 25-250 MG PO TABS
0.5000 | ORAL_TABLET | Freq: Every day | ORAL | Status: DC
  Filled 2011-05-22 (×4): qty 1

## 2011-05-22 MED ORDER — SODIUM CHLORIDE 0.9 % IV SOLN: 250.0000 mL | INTRAVENOUS | Status: DC

## 2011-05-22 MED ORDER — LIDOCAINE-EPINEPHRINE 1 %-1:100000 IJ SOLN
INTRAMUSCULAR | Status: DC | PRN
  Administered 2011-05-22: 5 mL

## 2011-05-22 MED ORDER — METHOCARBAMOL 500 MG PO TABS
500.0000 mg | ORAL_TABLET | Freq: Four times a day (QID) | ORAL | Status: DC
  Administered 2011-05-22 – 2011-05-30 (×25): 500 mg via ORAL
  Filled 2011-05-22 (×34): qty 1

## 2011-05-22 MED ORDER — B COMPLEX-C PO TABS
1.0000 | ORAL_TABLET | Freq: Every day | ORAL | Status: DC
  Administered 2011-05-23 – 2011-05-30 (×8): 1 via ORAL
  Filled 2011-05-22 (×9): qty 1

## 2011-05-22 MED ORDER — PROPOFOL 10 MG/ML IV BOLUS
INTRAVENOUS | Status: DC | PRN
  Administered 2011-05-22: 140 mg via INTRAVENOUS

## 2011-05-22 MED ORDER — VANCOMYCIN HCL IN DEXTROSE 1-5 GM/200ML-% IV SOLN
INTRAVENOUS | Status: AC
  Administered 2011-05-22: 1000 mg via INTRAVENOUS
  Filled 2011-05-22: qty 200

## 2011-05-22 MED ORDER — ALUM & MAG HYDROXIDE-SIMETH 200-200-20 MG/5ML PO SUSP: 30.0000 mL | Freq: Four times a day (QID) | ORAL | Status: DC | PRN

## 2011-05-22 MED ORDER — SODIUM CHLORIDE 0.9 % IJ SOLN
3.0000 mL | Freq: Two times a day (BID) | INTRAMUSCULAR | Status: DC
  Administered 2011-05-22 – 2011-05-30 (×12): 3 mL via INTRAVENOUS

## 2011-05-22 SURGICAL SUPPLY — 60 items
ACCESSORY KIT ×2 IMPLANT
BAG DECANTER FOR FLEXI CONT (MISCELLANEOUS) ×2 IMPLANT
BLADE SURG 10 STRL SS (BLADE) ×2 IMPLANT
BLADE SURG 15 STRL LF DISP TIS (BLADE) ×1 IMPLANT
BLADE SURG 15 STRL SS (BLADE) ×2
BOOT SUTURE AID YELLOW STND (SUTURE) ×1 IMPLANT
CANISTER SUCTION 2500CC (MISCELLANEOUS) ×2 IMPLANT
CLOTH BEACON ORANGE TIMEOUT ST (SAFETY) ×2 IMPLANT
CORDS BIPOLAR (ELECTRODE) ×2 IMPLANT
DRAPE CAMERA VIDEO/LASER (DRAPES) ×1 IMPLANT
DRAPE INCISE IOBAN 66X45 STRL (DRAPES) ×2 IMPLANT
DRAPE LAPAROTOMY T 102X78X121 (DRAPES) ×1 IMPLANT
DRAPE PED LAPAROTOMY (DRAPES) ×1 IMPLANT
DRAPE POUCH INSTRU U-SHP 10X18 (DRAPES) ×1 IMPLANT
DRESSING TELFA 8X3 (GAUZE/BANDAGES/DRESSINGS) ×2 IMPLANT
DRSG OPSITE 4X5.5 SM (GAUZE/BANDAGES/DRESSINGS) ×2 IMPLANT
ELECT CAUTERY BLADE 6.4 (BLADE) ×2 IMPLANT
ELECT REM PT RETURN 9FT ADLT (ELECTROSURGICAL) ×2
ELECTRODE REM PT RTRN 9FT ADLT (ELECTROSURGICAL) ×1 IMPLANT
GAUZE SPONGE 4X4 16PLY XRAY LF (GAUZE/BANDAGES/DRESSINGS) ×2 IMPLANT
GLOVE BIO SURGEON STRL SZ8 (GLOVE) ×3 IMPLANT
GLOVE BIOGEL PI IND STRL 8 (GLOVE) ×1 IMPLANT
GLOVE BIOGEL PI IND STRL 8.5 (GLOVE) ×2 IMPLANT
GLOVE BIOGEL PI INDICATOR 8 (GLOVE) ×1
GLOVE BIOGEL PI INDICATOR 8.5 (GLOVE) ×2
GLOVE ECLIPSE 7.5 STRL STRAW (GLOVE) ×1 IMPLANT
GLOVE EXAM NITRILE LRG STRL (GLOVE) IMPLANT
GLOVE EXAM NITRILE MD LF STRL (GLOVE) IMPLANT
GLOVE EXAM NITRILE XL STR (GLOVE) IMPLANT
GLOVE EXAM NITRILE XS STR PU (GLOVE) IMPLANT
GLOVE SURG SS PI 8.5 STRL IVOR (GLOVE) ×2
GLOVE SURG SS PI 8.5 STRL STRW (GLOVE) IMPLANT
GOWN BRE IMP SLV AUR LG STRL (GOWN DISPOSABLE) ×1 IMPLANT
GOWN BRE IMP SLV AUR XL STRL (GOWN DISPOSABLE) IMPLANT
GOWN STRL REIN 2XL LVL4 (GOWN DISPOSABLE) ×1 IMPLANT
KIT BASIN OR (CUSTOM PROCEDURE TRAY) ×2 IMPLANT
KIT NEUROSTIMULATOR ACCESSORY (KITS) ×1 IMPLANT
KIT ROOM TURNOVER OR (KITS) ×2 IMPLANT
NDL HYPO 25X1 1.5 SAFETY (NEEDLE) ×1 IMPLANT
NEEDLE HYPO 25X1 1.5 SAFETY (NEEDLE) ×2 IMPLANT
NS IRRIG 1000ML POUR BTL (IV SOLUTION) ×2 IMPLANT
PACK SURGICAL SETUP 50X90 (CUSTOM PROCEDURE TRAY) ×2 IMPLANT
PENCIL BUTTON HOLSTER BLD 10FT (ELECTRODE) ×2 IMPLANT
SPONGE GAUZE 4X4 12PLY (GAUZE/BANDAGES/DRESSINGS) ×1 IMPLANT
STAPLER SKIN PROX WIDE 3.9 (STAPLE) ×2 IMPLANT
STRIP CLOSURE SKIN 1/2X4 (GAUZE/BANDAGES/DRESSINGS) ×1 IMPLANT
SUT ETHILON 3 0 PS 1 (SUTURE) ×2 IMPLANT
SUT SILK 2 0 FS (SUTURE) ×1 IMPLANT
SUT SILK 2 0 TIES 10X30 (SUTURE) ×2 IMPLANT
SUT VIC AB 2-0 CP2 18 (SUTURE) ×2 IMPLANT
SUT VIC AB 3-0 SH 8-18 (SUTURE) ×2 IMPLANT
SWAB COLLECTION DEVICE MRSA (MISCELLANEOUS) ×1 IMPLANT
SWABSTICK BENZOIN STERILE (MISCELLANEOUS) ×2 IMPLANT
SYR BULB 3OZ (MISCELLANEOUS) ×2 IMPLANT
SYR CONTROL 10ML LL (SYRINGE) ×2 IMPLANT
TOWEL OR 17X24 6PK STRL BLUE (TOWEL DISPOSABLE) ×2 IMPLANT
TOWEL OR 17X26 10 PK STRL BLUE (TOWEL DISPOSABLE) ×2 IMPLANT
TUBE ANAEROBIC SPECIMEN COL (MISCELLANEOUS) ×1 IMPLANT
TUBE CONNECTING 12X1/4 (SUCTIONS) ×2 IMPLANT
WATER STERILE IRR 1000ML POUR (IV SOLUTION) ×2 IMPLANT

## 2011-05-22 NOTE — Interval H&P Note (Signed)
History and Physical Interval Note:  05/22/2011 7:29 AM  [name]  has presented today for surgery, with the diagnosis of infection of internal pulse generator paralysis agitans  The various methods of treatment have been discussed with the patient and family. After consideration of risks, benefits and other options for treatment, the patient has consented to  Procedure(s) (LRB): SUBTHALAMIC STIMULATOR REMOVAL (N/A) as a surgical intervention .  The patients' history has been reviewed, patient examined, no change in status, stable for surgery.  I have reviewed the patients' chart and labs.  Questions were answered to the patient's satisfaction.     Velicia Dejager D  Date of Initial H&P: 05/21/2011   History reviewed, patient examined, no change in status, stable for surgery.

## 2011-05-22 NOTE — Progress Notes (Signed)
Patient ID: [name], male   DOB: [birth year], 78 y.o.   MRN: [id number]  Alert, conversant, smiling. Denies pain. Dyskinesias present RUE, but much milder than earlier. Drsg intact, dry.   Thank you to Infectious Disease for assisting in this gentleman's care. Gram positive cocci were found with today's explant of left upper chest IPG.  Patient's wife will administer his Parkinsons meds from his home med supply at their established times to facilitate return to baseline dyskinetic vs rigid muscle tone d/t brittle nature of his Parkinsons complicated by today's explant of one of his two IPG's. Parkinsons medications orders modified to reflect this on Dr. Fredrich Birks orders.  Oris Drone Ellinor Test, RN, BSN

## 2011-05-22 NOTE — Consult Note (Signed)
Infectious Diseases Initial Consultation  Reason for Consultation:  Pocket infection   HPI: [name] is a 78 y.o. male with a history of placement of a deep brain stimulator in [facility] [place] in 2002 and evaluated by neurosurgery for depletion who on Tuesday prior to admission noted drainage from the pocket site.  No fever or chills.  He had been having trouble with the device and had been followed by Dr. Venetia Maxon when this developed.  No history of infection in the area.  Some pain.   Taken to the OR and it was removed with the extensions.  Gram stain with GPC in clusters.  Past Medical History  Diagnosis Date  . HYPERLIPIDEMIA-MIXED 02/10/2008  . PARKINSON'S DISEASE 02/10/2008  . HYPERTENSION, BENIGN 02/09/2009  . GASTROINTESTINAL HEMORRHAGE, HX OF 02/10/2008  . ANEMIA 04/29/2010  . FIBRILLATION, ATRIAL 04/29/2010  . VENTRAL HERNIA 04/29/2010  . DEGENERATIVE JOINT DISEASE 04/29/2010  . Gastric ulcer   . Positive PPD   . Internal hemorrhoids   . Hyperplastic colon polyp 2004  . CAD (coronary artery disease)     23.0 x 24 mm Taxus DES mid LAD 2005, DCA diagonal branch 2005  . VENOUS INSUFFICIENCY 02/10/2008    rt  . Shortness of breath   . Osteomyelitis   . Calculus of kidney   . Tuberculosis     78 yrs old  . GERD (gastroesophageal reflux disease)     ulcers hx    Allergies:  Allergies  Allergen Reactions  . Morphine   . Risperidone And Related     Current antibiotics:   MEDICATIONS:    . amantadine  100 mg Oral BID  . aspirin EC  81 mg Oral Daily  . B-complex with vitamin C  1 tablet Oral Daily  . bacitracin      . carbidopa-levodopa  1 tablet Oral 6 X Daily  .  ceFAZolin (ANCEF) IV  1 g Intravenous Q8H  .  ceFAZolin (ANCEF) IV  2 g Intravenous 60 min Pre-Op  . dextrose 5 % and 0.45 % NaCl with KCl 20 mEq/L      . meloxicam  15 mg Oral Daily  . methocarbamol  500 mg Oral QID  . midodrine  5 mg Oral BID  . mupirocin ointment      . pantoprazole  40 mg Oral Q1200  .  PARoxetine  20 mg Oral Daily  . polyethylene glycol  17 g Oral Daily  . pramipexole  0.5 mg Oral 6 X Daily  . saccharomyces boulardii  250 mg Oral Daily  . simvastatin  40 mg Oral QHS  . sodium chloride      . sodium chloride  3 mL Intravenous Q12H  . vancomycin      . vancomycin  750 mg Intravenous Q12H  . DISCONTD: b complex vitamins  1 tablet Oral Daily  . DISCONTD: carbidopa-levodopa  0.5 tablet Oral 6 X Daily  . DISCONTD: PROBIOTIC FORMULA  1 capsule Oral Daily  . DISCONTD: vancomycin  1,000 mg Intravenous Q12H    History  Substance Use Topics  . Smoking status: Never Smoker   . Smokeless tobacco: Never Used  . Alcohol Use: No    Family History  Problem Relation Age of Onset  . Parkinsonism Sister   . Diabetes Brother   . Crohn's disease Daughter   . Heart disease Paternal Grandfather   . Colon cancer Neg Hx     Review of Systems - Negative except as per  the HPI  OBJECTIVE: Temp:  [97.6 F (36.4 C)-98.2 F (36.8 C)] 97.7 F (36.5 C) (03/14 1423) Pulse Rate:  [42-96] 66  (03/14 1423) Resp:  [15-33] 16  (03/14 1423) BP: (102-156)/(58-123) 102/76 mmHg (03/14 1254) SpO2:  [87 %-100 %] 100 % (03/14 1423) Weight:  [209 lb (94.802 kg)] 209 lb (94.802 kg) (03/14 1354) General appearance: alert, cooperative and no distress Resp: clear to auscultation bilaterally Cardio: regular rate and rhythm, S1, S2 normal, no murmur, click, rub or gallop Skin: pocket area wrapped.  No rashes  LABS: Results for orders placed during the hospital encounter of 05/22/11 (from the past 48 hour(s))  SURGICAL PCR SCREEN     Status: Normal   Collection Time   05/22/11  6:44 AM      Component Value Range Comment   MRSA, PCR NEGATIVE  NEGATIVE     Staphylococcus aureus NEGATIVE  NEGATIVE    GRAM STAIN     Status: Normal   Collection Time   05/22/11  9:11 AM      Component Value Range Comment   Specimen Description WOUND      Special Requests        Value: ON SWAB INFECTION OF IPG  DEVICE IN CHEST WALL SUBTHALAMIC STIMULATOR   Gram Stain        Value: WOUND GRAM POSITIVE COCCI IN CLUSTERS WBC PRESENT, PREDOMINANTLY PMN     CALLED TO D. REES, RN [phone] [place]   Report Status 05/22/2011 FINAL     GRAM STAIN     Status: Normal   Collection Time   05/22/11  9:20 AM      Component Value Range Comment   Specimen Description WOUND      Special Requests        Value: SUBTHALAMIC STIMULATOR REMOVAL INFECTED IPG PT ON VANCO   Gram Stain        Value: NO ORGANISMS SEEN NO WBC SEEN NO YEAST OR FUNGAL ELEMENTS SEEN   Report Status 05/22/2011 FINAL       MICRO:  IMAGING: No results found.  HISTORICAL MICRO/IMAGING  Assessment/Plan:    1) pocket infection - will await cultures.  He will need at least 2-3 weeks of antibiotics.  On vancomycin now.   Will continue to follow.   Thanks for the consult

## 2011-05-22 NOTE — Progress Notes (Signed)
ANTIBIOTIC CONSULT NOTE - INITIAL  Pharmacy Consult for vancomycin Indication: infected internal pulse generator  Allergies  Allergen Reactions  . Morphine   . Risperidone And Related     Vital Signs: Temp: 97.7 F (36.5 C) (03/14 1423) Temp src: Oral (03/14 0639) BP: 102/76 mmHg (03/14 1254) Pulse Rate: 66  (03/14 1423) Intake/Output from previous day:   Intake/Output from this shift: Total I/O In: 900 [I.V.:900] Out: -   Microbiology: Recent Results (from the past 720 hour(s))  SURGICAL PCR SCREEN     Status: Normal   Collection Time   05/22/11  6:44 AM      Component Value Range Status Comment   MRSA, PCR NEGATIVE  NEGATIVE  Final    Staphylococcus aureus NEGATIVE  NEGATIVE  Final   GRAM STAIN     Status: Normal   Collection Time   05/22/11  9:11 AM      Component Value Range Status Comment   Specimen Description WOUND   Final    Special Requests     Final    Value: ON SWAB INFECTION OF IPG DEVICE IN CHEST WALL SUBTHALAMIC STIMULATOR   Gram Stain     Final    Value: WOUND GRAM POSITIVE COCCI IN CLUSTERS WBC PRESENT, PREDOMINANTLY PMN     CALLED TO D. REES, RN [phone] [place]   Report Status 05/22/2011 FINAL   Final   GRAM STAIN     Status: Normal   Collection Time   05/22/11  9:20 AM      Component Value Range Status Comment   Specimen Description WOUND   Final    Special Requests     Final    Value: SUBTHALAMIC STIMULATOR REMOVAL INFECTED IPG PT ON VANCO   Gram Stain     Final    Value: NO ORGANISMS SEEN NO WBC SEEN NO YEAST OR FUNGAL ELEMENTS SEEN   Report Status 05/22/2011 FINAL   Final     Medical History: Past Medical History  Diagnosis Date  . HYPERLIPIDEMIA-MIXED 02/10/2008  . PARKINSON'S DISEASE 02/10/2008  . HYPERTENSION, BENIGN 02/09/2009  . GASTROINTESTINAL HEMORRHAGE, HX OF 02/10/2008  . ANEMIA 04/29/2010  . FIBRILLATION, ATRIAL 04/29/2010  . VENTRAL HERNIA 04/29/2010  . DEGENERATIVE JOINT DISEASE 04/29/2010  . Gastric ulcer   . Positive  PPD   . Internal hemorrhoids   . Hyperplastic colon polyp 2004  . CAD (coronary artery disease)     23.0 x 24 mm Taxus DES mid LAD 2005, DCA diagonal branch 2005  . VENOUS INSUFFICIENCY 02/10/2008    rt  . Shortness of breath   . Osteomyelitis   . Calculus of kidney   . Tuberculosis     78 yrs old  . GERD (gastroesophageal reflux disease)     ulcers hx    Medications:  Prescriptions prior to admission  Medication Sig Dispense Refill  . aspirin 81 MG tablet Take 81 mg by mouth daily.        . B Complex Vitamins (B COMPLEX PO) Take 1 tablet by mouth 2 (two) times daily.       . carbidopa-levodopa (SINEMET) 25-250 MG per tablet Take 0.5 tablets by mouth 6 (six) times daily.       . fluticasone (FLONASE) 50 MCG/ACT nasal spray Place 1 spray into the nose daily as needed. For dry nasal passages      . meloxicam (MOBIC) 15 MG tablet Take 15 mg by mouth daily.       Marland Kitchen  methocarbamol (ROBAXIN) 500 MG tablet Take 500 mg by mouth 4 (four) times daily. For muscle spasms      . midodrine (PROAMATINE) 5 MG tablet Take 5 mg by mouth 2 (two) times daily.       Marland Kitchen omeprazole (PRILOSEC) 40 MG capsule Take 1 capsule (40 mg total) by mouth daily.  30 capsule  3  . PARoxetine (PAXIL) 20 MG tablet Take 1 tablet (20 mg total) by mouth every morning.  30 tablet  11  . polyethylene glycol (MIRALAX / GLYCOLAX) packet Take 17 g by mouth daily as needed. For constipation      . pramipexole (MIRAPEX) 1 MG tablet Take 0.5 mg by mouth 6 (six) times daily.      . Probiotic Product (PROBIOTIC FORMULA) CAPS Take 1 capsule by mouth daily.       . simvastatin (ZOCOR) 40 MG tablet Take 1 tablet (40 mg total) by mouth at bedtime.  90 tablet  3  . amantadine (SYMMETREL) 100 MG capsule Take 100 mg by mouth 2 (two) times daily.        Assessment: 78 yo who is s/p removal of infected subthalamic stimulator. He was given vanc 1g in the OR. Rx has been asked to dose vancomycin post op.   Goal of Therapy:  Vancomycin trough  level 15-20 mcg/ml  Plan:  1. Vanc 750mg  IV q12 2. F/u with trough as needed  Clide Cliff 05/22/2011,2:39 PM

## 2011-05-22 NOTE — Preoperative (Signed)
Beta Blockers   Reason not to administer Beta Blockers:Not Applicable 

## 2011-05-22 NOTE — Op Note (Signed)
05/22/2011  9:39 AM  PATIENT:  [name]  78 y.o. male  PRE-OPERATIVE DIAGNOSIS:  infection of internal pulse generator paralysis agitans  POST-OPERATIVE DIAGNOSIS:  infection of internal pulse generator paralysis agitans  PROCEDURE:  Procedure(s) (LRB): SUBTHALAMIC STIMULATOR Implantable pulse generatorREMOVAL along with electrode extension(N/A)  SURGEON:  Surgeon(s) and Role:    * Maeola Harman, MD - Primary  PHYSICIAN ASSISTANT:   ASSISTANTS: none   ANESTHESIA:   general  EBL:  Total I/O In: 900 [I.V.:900] Out: -   BLOOD ADMINISTERED:none  DRAINS: none   LOCAL MEDICATIONS USED:  LIDOCAINE   SPECIMEN:  No Specimen  DISPOSITION OF SPECIMEN:  N/A  COUNTS:  YES  TOURNIQUET:  * No tourniquets in log *  DICTATION: Patient developed draining left upper chest IPG pocket infection and it was elected to take him to surgery to remove left upper chest IPG.  Procedure:  Patient was brought to the OR and general LMA anesthesia was induced.  His left upper chest and posterior auricular region were prepped with betadine scrub and paint.  In an effort to preserve the right brain electrode with a separately implanted pulse generator, the post-auricular region was opened and the connectors were identified.  Neither was color coded, so I disconnected the electrode and extension from the electrode I thought was most likely to be the left brain electrode.  In doing so, the third contact became disconnected.  Impedence check revealed this to be the right brain electrode.  The collet from the extension was also disconnected.  The electrode and extension were reassembled and anchored with a 2-0 silk suture.  Impedences were reassessed and found to be intact between some, but not all contacts.  The other extension was then disconnected and impedences again were checked.  These were found to correspond with the left chest IPG. The extension was cut and the protective cover was reassembled over  the intact four contact electrode.  This incision was then closed with 2-0 vicryl sutures and staples.  The chest site was then reopened and the IPG was removed after cultures were obtained.  The extension was also removed.  The wound was irrigated and closed with vicryl sutures and staples.  The remaining extension was visualized as it traversed the pocket.  Sterile occlusive dressings were placed and the patient was extubated and taken to recovery having tolerated the procedure well.  Counts were correct at the end of the case.  The left abdomen IPG (right brain electrode) was turned back on at the preoperative settings.  PLAN OF CARE: Admit to inpatient   PATIENT DISPOSITION:  PACU - hemodynamically stable.   Delay start of Pharmacological VTE agent (>24hrs) due to surgical blood loss or risk of bleeding: yes

## 2011-05-22 NOTE — Anesthesia Procedure Notes (Signed)
Procedure Name: LMA Insertion Date/Time: 05/22/2011 7:58 AM Performed by: Leona Singleton A Pre-anesthesia Checklist: Patient identified Patient Re-evaluated:Patient Re-evaluated prior to inductionOxygen Delivery Method: Circle system utilized Preoxygenation: Pre-oxygenation with 100% oxygen Intubation Type: IV induction LMA: LMA inserted LMA Size: 4.0 Tube type: Oral Number of attempts: 1 Placement Confirmation: positive ETCO2 and breath sounds checked- equal and bilateral Tube secured with: Tape Dental Injury: Teeth and Oropharynx as per pre-operative assessment

## 2011-05-22 NOTE — Anesthesia Preprocedure Evaluation (Addendum)
Anesthesia Evaluation  Patient identified by MRN, date of birth, ID band Patient awake    Reviewed: Allergy & Precautions, H&P , NPO status , Patient's Chart, lab work & pertinent test results  History of Anesthesia Complications Negative for: history of anesthetic complications  Airway Mallampati: II TM Distance: >3 FB Neck ROM: Limited    Dental  (+) Teeth Intact and Dental Advisory Given   Pulmonary shortness of breath, with exertion and at rest,  H/o TB as child-did receive tx         Cardiovascular Exercise Tolerance: Poor hypertension, Pt. on medications + CAD, + Past MI (2005) and + Cardiac Stents (2005) + dysrhythmias Atrial Fibrillation  EF 55-65%   Neuro/Psych Brain Stimulator  Neuromuscular disease (Parkinson's ) negative psych ROS   GI/Hepatic Neg liver ROS, PUD, GERD-  Medicated,Some difficulty swallowing   Endo/Other  negative endocrine ROS  Renal/GU Renal InsufficiencyRenal disease (Cr 1.4)     Musculoskeletal  (+) Osteoarthritis,    Abdominal   Peds  Hematology  (+) Blood dyscrasia, anemia ,   Anesthesia Other Findings   Reproductive/Obstetrics negative OB ROS                         Anesthesia Physical Anesthesia Plan  ASA: III  Anesthesia Plan: MAC   Post-op Pain Management:    Induction: Intravenous  Airway Management Planned: Natural Airway and Nasal Cannula  Additional Equipment:   Intra-op Plan:   Post-operative Plan:   Informed Consent: I have reviewed the patients History and Physical, chart, labs and discussed the procedure including the risks, benefits and alternatives for the proposed anesthesia with the patient or authorized representative who has indicated his/her understanding and acceptance.   Dental advisory given  Plan Discussed with: Anesthesiologist  Anesthesia Plan Comments:         Anesthesia Quick Evaluation

## 2011-05-22 NOTE — H&P (Signed)
[name] #[id number] DOB: Sep 24, 1929   Patient came back to office yesterday with drainage from his left upper chest IPG site.  This will need to be removed, along with the extension.  Hopefully, we will be able to protect his other IPG and he will have a new IPG placed after his pocket infection has resolved.  Patient understands risks of surgery and wishes to proceed.       NEUROSURGICAL CONSULTATION  [name] DOB: Jun 01, 2029 #[id number] Jul 17, 2005  HISTORY OF PRESENT ILLNESS: [name] is a 78 year old, right-handed, retired Professor of Biology who presents at the request of Dr. Jac Canavan for neurosurgical consultation for implantable pulse generator depletion. He has undergone subthalamic DBS placement for Parkinson's, which was done in [place], [place] in April of 2002. He underwent DBS battery implantation with bilateral Soletra batteries; however, one was positioned in the left interclavicular site and the other was placed in the subcostal site. The subcostal site is for the right DBS electrode, the left interclavicular is for the left DBC electrode. This was done on 06/09/00. He comes in today with worsening symptoms and with battery depletion.  [name] currently says the right side of his body is worse. He was initially diagnosed with Parkinson's in 1987. He said his left side was worse, however, now his right side is worse, and he has difficulty with tremors and rigidity since 12/2004 at which point he underwent back surgery, which was done by Dr. Otelia Sergeant. He says that his rigidity has been worse since then. He has been taking Mirapex .5 mg. every three hours and Carbidopa 25/250 also every three hours. He has had heart stents placed in 2005 and had a right total knee arthroplasty. He has had an exploratory laparotomy, appendectomy, and left heel spur removed, in addition to recent lumbar surgery by Dr. Vira Browns. He had a previous surgery on his low back in June of 2005. He says he  has been working in an exercise group with Bonney Leitz twice a week and has noted a lot more difficulty with his Parkinson's.  He comes in today specifically for battery depletion for the need of battery revision.  REVIEW OF SYSTEMS: A detailed Review of Systems sheet was reviewed with the patient. Pertinent positives include under eyes - wears glasses, cataracts; under ear, nose, throat and mouth - nasal congestion/drainage, inability to smell, sinus problems; under cardiovascular - swelling in feet and legs; under musculoskeletal - back pain, arthritis, neck pain. All other systems are negative; this includes Constitutional symptoms, Endocrine, Respiratory, Gastrointestinal, Genitourinary, Integumentary & Breast, Neurologic, Psychiatric, Hematologic/Lymphatic, Allergic/Immunologic.  PAST MEDICAL HISTORY:  Current Medical Conditions: Previously described.  [name] DOB: Jun 01, 2029 #[id number]  Jul 17, 2005  Page Two  Prior Operations and Hospitalizations: Previously described.  Medications and Allergies: Current medications - Mirapex .5 mg. po q3h, Carbidopa  of 25/250 po q3h, Aspirin 81 mg. qd, Plavix 75 mg. qd, Paroxetine 20 mg. qd, Lipitor 80 mg. qd, Furosemide 40 mg. qd, Potassium 20 mg. qd, Propoxyphene 100/650 prn. His ALLERGIES ARE POSSIBLY TO NITROGLYCERIN AND MORPHINE.  Height and Weight: He is 6' tall, 218 lbs.  SOCIAL HISTORY: He is a nonsmoker, nondrinker, no history of substance abuse.  PHYSICAL EXAMINATION:  General Appearance: On examination today, [name] is a pleasant, cooperative, older man in no acute distress.  Blood Pressure, Pulse: His blood pressure is 122/82 in the left arm seated. Heart rate is 72 and regular.  HEENT -  normocephalic, atraumatic. The pupils are equal, round and reactive to light. The extraocular muscles are intact. Sclerae - white. Conjunctiva - pink. Oropharynx benign. Uvula midline.  Neck - there are no masses, meningismus, deformities, tracheal deviation,  jugular vein distention or carotid bruits. There is normal cervical range of motion. Spurlings' test is negative without reproducible radicular pain turning the patient's head to either side. Lhermitte's sign is not present with axial compression.  Respiratory - there is normal respiratory effort with good intercostal function. Lungs are clear to auscultation. There are no rales, rhonchi or wheezes.  Cardiovascular - the heart has regular rate and rhythm to auscultation. No murmurs are appreciated. There is no extremity edema, cyanosis or clubbing. There are palpable pedal pulses.  Abdomen - soft, nontender, no hepatosplenomegaly appreciated or masses. There are active bowel sounds. No guarding or rebound. He has an interclavicular IBG implantable pulse generator and a subcostal implantable pulse generator, both on the left, and there appears to be no complications related to those.  [name] DOB: 02-Jul-2029 #[id number]  Jul 17, 2005  Page Three  Musculoskeletal Examination - the patient is able to walk about the examining room with a normal, casual, heel and toe gait.  NEUROLOGICAL EXAMINATION: The patient is oriented to time, person and place and has good recall of both recent and remote memory with normal attention span and concentration. The patient speaks with clear and fluent speech and exhibits normal language function and appropriate fund of knowledge. He is awake, alert and conversant.  Cranial Nerve Examination - pupils are equal, round and reactive to light. Extraocular movements are full. Visual fields are full to confrontational testing. Facial sensation and facial movement are symmetric and intact. Hearing is intact to finger rub. Palate is upgoing. Shoulder shrug is symmetric. Tongue protrudes in the midline.  Motor Examination - He has a wide-based and unsteady gait. He has a Parkinsonian tremor, right greater than left and increased tone bilaterally, also right greater than left.  Deep  Tendon Reflexes - reflexes are 1 and symmetric in the upper and lower extremities bilaterally.  Cerebellar Examination - normal coordination in upper and lower extremities and normal rapid alternating movements. Romberg test is negative.  IMPRESSION AND RECOMMENDATIONS: [name] is a 78 year old man with Parkinson's. He has a depleted implantable pulse generator. We got no signal from the left interclavicular IBG. We do get some transmission from the left subcostal IBG. At this point both of these pulse generators should be changed. We would like to have him assessed by Dr. Juanda Chance to make sure there is no problem with him coming off the Plavix. I would like him to be off Plavix for at least ten days before going ahead with surgery.  [facility]  Danae Orleans. Venetia Maxon, M.D.  JDS:aft  cc: Dr. Jac Canavan

## 2011-05-22 NOTE — Progress Notes (Signed)
Dr Venetia Maxon notified of gram stain results; rare WBC, gram  Positive cocci in cluster.  Lonna Rabold Office manager

## 2011-05-22 NOTE — Progress Notes (Signed)
Mirapex 0.5mg  and Carbidopa 1/2 of a 25/250 pill administered PO by wife from pt's home meds per Dr Venetia Maxon. Second dosing while in PACU-meds ordered Q3H during the day.   Cullen Lahaie Office manager

## 2011-05-22 NOTE — Progress Notes (Signed)
Quick Note:  Pt informed on personally identified VM ______ 

## 2011-05-22 NOTE — Transfer of Care (Signed)
Immediate Anesthesia Transfer of Care Note  Patient: [name]  Procedure(s) Performed: Procedure(s) (LRB): SUBTHALAMIC STIMULATOR REMOVAL (N/A)  Patient Location: PACU  Anesthesia Type: General  Level of Consciousness: awake, alert , oriented and patient cooperative  Airway & Oxygen Therapy: Patient Spontanous Breathing and Patient connected to nasal cannula oxygen  Post-op Assessment: Report given to PACU RN and Post -op Vital signs reviewed and stable  Post vital signs: Reviewed and stable  Complications: No apparent anesthesia complications

## 2011-05-22 NOTE — Progress Notes (Signed)
Mirapex 0.5mg  and Carbidopa 1/2 of a 25/250 pill administered PO by wife from pt's home meds per Dr Venetia Maxon.  [name] Office manager

## 2011-05-22 NOTE — Progress Notes (Signed)
Paroxitine 20 mg midorine 5 mg meloxicam15 mg Amantadine 100mg   These medications administered by wife from home meds per order Dr Venetia Maxon.  [name] Office manager

## 2011-05-22 NOTE — Anesthesia Postprocedure Evaluation (Signed)
  Anesthesia Post-op Note  Patient: [name]  Procedure(s) Performed: Procedure(s) (LRB): SUBTHALAMIC STIMULATOR REMOVAL (N/A)  Patient Location: PACU  Anesthesia Type: General  Level of Consciousness: awake  Airway and Oxygen Therapy: Patient Spontanous Breathing  Post-op Pain: mild  Post-op Assessment: Post-op Vital signs reviewed  Post-op Vital Signs: stable  Complications: No apparent anesthesia complications

## 2011-05-23 ENCOUNTER — Telehealth: Payer: Self-pay | Admitting: *Deleted

## 2011-05-23 DIAGNOSIS — Y849 Medical procedure, unspecified as the cause of abnormal reaction of the patient, or of later complication, without mention of misadventure at the time of the procedure: Secondary | ICD-10-CM

## 2011-05-23 DIAGNOSIS — T8579XA Infection and inflammatory reaction due to other internal prosthetic devices, implants and grafts, initial encounter: Secondary | ICD-10-CM

## 2011-05-23 MED FILL — Mupirocin Oint 2%: CUTANEOUS | Qty: 22 | Status: AC

## 2011-05-23 NOTE — Telephone Encounter (Signed)
[name] would like to speak to [place] today re: Pt's current hospitalization.

## 2011-05-23 NOTE — Progress Notes (Signed)
Subjective: Patient reports "I feel good, just shaking."  Objective: Vital signs in last 24 hours: Temp:  [97.6 F (36.4 C)-98.9 F (37.2 C)] 98 F (36.7 C) (03/15 0500) Pulse Rate:  [42-96] 60  (03/15 0500) Resp:  [15-33] 18  (03/15 0500) BP: (102-156)/(58-123) 136/60 mmHg (03/15 0500) SpO2:  [87 %-100 %] 88 % (03/15 0500) Weight:  [94.802 kg (209 lb)] 94.802 kg (209 lb) (03/14 1354)  Intake/Output from previous day: 03/14 0701 - 03/15 0700 In: 1160 [P.O.:260; I.V.:900] Out: -  Intake/Output this shift:    Alert, conversant, smiling. Tremor BUE, R>L present. Reassured - will adjust stimulation & meds over the next few days/weeks as new baseline established. Drsg intact, clean.  Pt understands plan for antibiotics x2-3 weeks.   Lab Results:  [facility] 05/21/11 0956  WBC 6.6  HGB 11.8*  HCT 36.2*  PLT 154.0   BMET  Basename 05/21/11 0956  NA 140  K 4.6  CL 107  CO2 26  GLUCOSE 82  BUN 30*  CREATININE 1.4  CALCIUM 9.7    Studies/Results: No results found.  Assessment/Plan: Stable, monitoring wound & reestablishing Parkinsons s/s baseline.  LOS: 1 day  Plan for continued antibiotics awaiting cultures. Thank you to Infectious Disease for following. Parkinsons meds & stim adjustments will take place on o/p basis in coming weeks.    Georgiann Cocker 05/23/2011, 8:10 AM   Awaiting speciation of bacterial infection with continued home abx therapy.

## 2011-05-23 NOTE — Telephone Encounter (Signed)
Patient's wife called.  She has some questions about her husbands physical therapy.  Please give her a call back

## 2011-05-23 NOTE — Progress Notes (Signed)
INFECTIOUS DISEASE PROGRESS NOTE  ID: [name] is a 78 y.o. male with pocket infection from neruostimulator.  Subjective: No compliants.  No fever, chills   Abtx:  Anti-infectives     Start     Dose/Rate Route Frequency Ordered Stop   05/22/11 1800   vancomycin (VANCOCIN) 750 mg in sodium chloride 0.9 % 150 mL IVPB        750 mg 150 mL/hr over 60 Minutes Intravenous Every 12 hours 05/22/11 1448     05/22/11 1530   ceFAZolin (ANCEF) IVPB 1 g/50 mL premix        1 g 100 mL/hr over 30 Minutes Intravenous Every 8 hours 05/22/11 1522 05/22/11 2321   05/22/11 1000   vancomycin (VANCOCIN) IVPB 1000 mg/200 mL premix  Status:  Discontinued        1,000 mg 200 mL/hr over 60 Minutes Intravenous Every 12 hours 05/22/11 0952 05/22/11 1431   05/22/11 0824   bacitracin 50,000 Units in sodium chloride irrigation 0.9 % 500 mL irrigation  Status:  Discontinued          As needed 05/22/11 0840 05/22/11 0933   05/22/11 0740   bacitracin 86578 UNITS injection     Comments: FREEZE, PATRICIA: cabinet override         05/22/11 0740 05/22/11 1944   05/22/11 0734   vancomycin (VANCOCIN) 1 GM/200ML IVPB     Comments: Leona Singleton: cabinet override         05/22/11 0734 05/22/11 0741   05/22/11 0000   ceFAZolin (ANCEF) IVPB 2 g/50 mL premix        2 g 100 mL/hr over 30 Minutes Intravenous 60 min pre-op 05/21/11 1621 05/22/11 0752          Medications: I have reviewed the patient's current medications.  Objective: Vital signs in last 24 hours: Temp:  [97.6 F (36.4 C)-98.9 F (37.2 C)] 98 F (36.7 C) (03/15 0500) Pulse Rate:  [42-96] 60  (03/15 0500) Resp:  [15-33] 18  (03/15 0500) BP: (102-156)/(58-123) 136/60 mmHg (03/15 0500) SpO2:  [87 %-100 %] 88 % (03/15 0500) Weight:  [209 lb (94.802 kg)] 209 lb (94.802 kg) (03/14 1354)   General appearance: alert, cooperative and no distress Chest wall: no tenderness, + pocket wrapped, no surrounding erythema. Cardio: regular rate and  rhythm, S1, S2 normal, no murmur, click, rub or gallop GI: soft, non-tender; bowel sounds normal; no masses,  no organomegaly + umbilical hernia Lab Results  [facility] 05/21/11 0956  WBC 6.6  HGB 11.8*  HCT 36.2*  NA 140  K 4.6  CL 107  CO2 26  BUN 30*  CREATININE 1.4  GLU --   Liver Panel  Basename 05/21/11 0956  PROT 7.9  ALBUMIN 4.4  AST 40*  ALT 7  ALKPHOS 88  BILITOT 0.5  BILIDIR 0.0  IBILI --   Sedimentation Rate No results found for this basename: ESRSEDRATE in the last 72 hours C-Reactive Protein No results found for this basename: CRP:2 in the last 72 hours  Microbiology: Recent Results (from the past 240 hour(s))  SURGICAL PCR SCREEN     Status: Normal   Collection Time   05/22/11  6:44 AM      Component Value Range Status Comment   MRSA, PCR NEGATIVE  NEGATIVE  Final    Staphylococcus aureus NEGATIVE  NEGATIVE  Final   ANAEROBIC CULTURE     Status: Normal (Preliminary result)   Collection Time   05/22/11  9:11 AM      Component Value Range Status Comment   Specimen Description WOUND   Final    Special Requests     Final    Value: ON SWAB INFECTION OF IPG DEVICE IN CHEST WALL SUBTHALAMIC STIMULATOR   Gram Stain     Final    Value: RARE WBC PRESENT, PREDOMINANTLY PMN     RARE GRAM POSITIVE COCCI IN CLUSTERS     Gram Stain Report Called to,Read Back By and Verified With: Gram Stain Report Called to,Read Back By and Verified With: D REES RN 1100 05/22/11 JKILGORE Performed at [facility]   Culture PENDING   Incomplete    Report Status PENDING   Incomplete   WOUND CULTURE     Status: Normal (Preliminary result)   Collection Time   05/22/11  9:11 AM      Component Value Range Status Comment   Specimen Description WOUND   Final    Special Requests     Final    Value: ON SWAB INFECTION OF IPG DEVICE IN CHEST WALL SUBTHALAMIC STIMULATOR   Gram Stain     Final    Value: RARE WBC PRESENT, PREDOMINANTLY PMN     RARE GRAM POSITIVE COCCI IN CLUSTERS      Ladell Pier REES RN 1100 05/22/11 JKILGORE Performed at [facility]   Culture PENDING   Incomplete    Report Status PENDING   Incomplete   GRAM STAIN     Status: Normal   Collection Time   05/22/11  9:11 AM      Component Value Range Status Comment   Specimen Description WOUND   Final    Special Requests     Final    Value: ON SWAB INFECTION OF IPG DEVICE IN CHEST WALL SUBTHALAMIC STIMULATOR   Gram Stain     Final    Value: WOUND GRAM POSITIVE COCCI IN CLUSTERS WBC PRESENT, PREDOMINANTLY PMN     CALLED TO D. REES, RN 1100 03.14.13 JKILGORE   Report Status 05/22/2011 FINAL   Final   ANAEROBIC CULTURE     Status: Normal (Preliminary result)   Collection Time   05/22/11  9:20 AM      Component Value Range Status Comment   Specimen Description WOUND   Final    Special Requests     Final    Value: SUBTHALAMIC STIMULATOR REMOVAL INFECTED IPG PT ON VANCO   Gram Stain     Final    Value: NO WBC SEEN     NO ORGANISMS SEEN     Performed at [facility]   Culture PENDING   Incomplete    Report Status PENDING   Incomplete   WOUND CULTURE     Status: Normal (Preliminary result)   Collection Time   05/22/11  9:20 AM      Component Value Range Status Comment   Specimen Description WOUND   Final    Special Requests     Final    Value: SUBTHALAMIC STIMULATOR REMOVAL INFECTED IPG PT ON VANCO   Gram Stain     Final    Value: NO WBC SEEN     NO ORGANISMS SEEN     Performed at [facility]   Culture NO GROWTH 1 DAY   Final    Report Status PENDING   Incomplete   GRAM STAIN     Status: Normal   Collection Time   05/22/11  9:20 AM  Component Value Range Status Comment   Specimen Description WOUND   Final    Special Requests     Final    Value: SUBTHALAMIC STIMULATOR REMOVAL INFECTED IPG PT ON VANCO   Gram Stain     Final    Value: NO ORGANISMS SEEN NO WBC SEEN NO YEAST OR FUNGAL ELEMENTS SEEN   Report Status 05/22/2011 FINAL   Final     Studies/Results: No results  found.   Assessment/Plan: 1) pocket infection - continue with IV vancomycin for 3 weeks.  Will continue to follow cultures and adjust antibiotics if indicated.    Dr. Orvan Falconer on over the weekend for any questions if needed.    Damacio Weisgerber Infectious Diseases 05/23/2011, 9:26 AM

## 2011-05-23 NOTE — Telephone Encounter (Signed)
I called both home and cell phone, no answer, message left FYI

## 2011-05-24 LAB — WOUND CULTURE

## 2011-05-24 NOTE — Progress Notes (Signed)
OT/ PT Treatment Note  Rn [name]) paged therapist to return to room to speak to wife regarding d/c planning. Pt's wife very concerned with mobility and sit<>stand. After much discussion, recommendation is for skilled therapy (SNF) at this time. OT/ PT to treat patient on Monday 05/26/11 at 9AM with wife (A)ing patient. Per patient during evaluation, reporting mobility near baseline. OT/ PT to continue to follow and make recommendations based on patient and wife needs.    [name], [name]   OTR/L Pager: [phone] Office: [phone] . 1 visit Time: 15:10-15:21

## 2011-05-24 NOTE — Progress Notes (Signed)
Patient ID: [name], male   DOB: [birth year], 78 y.o.   MRN: [id number]  INFECTIOUS DISEASE PROGRESS NOTE    Date of Admission:  05/22/2011           Day 3 vancomycin  Objective: Temp:  [97.8 F (36.6 C)-98.3 F (36.8 C)] 98.3 F (36.8 C) (03/16 1010) Pulse Rate:  [65-93] 71  (03/16 1010) Resp:  [18-20] 20  (03/16 1010) BP: (85-127)/(52-79) 85/52 mmHg (03/16 1010) SpO2:  [91 %-98 %] 96 % (03/16 1010)    Microbiology: Recent Results (from the past 240 hour(s))  SURGICAL PCR SCREEN     Status: Normal   Collection Time   05/22/11  6:44 AM      Component Value Range Status Comment   MRSA, PCR NEGATIVE  NEGATIVE  Final    Staphylococcus aureus NEGATIVE  NEGATIVE  Final   ANAEROBIC CULTURE     Status: Normal (Preliminary result)   Collection Time   05/22/11  9:11 AM      Component Value Range Status Comment   Specimen Description WOUND   Final    Special Requests     Final    Value: ON SWAB INFECTION OF IPG DEVICE IN CHEST WALL SUBTHALAMIC STIMULATOR   Gram Stain     Final    Value: RARE WBC PRESENT, PREDOMINANTLY PMN     RARE GRAM POSITIVE COCCI IN CLUSTERS     Gram Stain Report Called to,Read Back By and Verified With: Gram Stain Report Called to,Read Back By and Verified With: D REES RN 1100 05/22/11 JKILGORE Performed at [facility]   Culture     Final    Value: NO ANAEROBES ISOLATED; CULTURE IN PROGRESS FOR 5 DAYS   Report Status PENDING   Incomplete   WOUND CULTURE     Status: Normal (Preliminary result)   Collection Time   05/22/11  9:11 AM      Component Value Range Status Comment   Specimen Description WOUND   Final    Special Requests     Final    Value: ON SWAB INFECTION OF IPG DEVICE IN CHEST WALL SUBTHALAMIC STIMULATOR   Gram Stain     Final    Value: RARE WBC PRESENT, PREDOMINANTLY PMN     RARE GRAM POSITIVE COCCI IN CLUSTERS     Ladell Pier REES RN 1100 05/22/11 JKILGORE Performed at [facility]   Culture NO GROWTH 1 DAY   Final    Report Status  PENDING   Incomplete   GRAM STAIN     Status: Normal   Collection Time   05/22/11  9:11 AM      Component Value Range Status Comment   Specimen Description WOUND   Final    Special Requests     Final    Value: ON SWAB INFECTION OF IPG DEVICE IN CHEST WALL SUBTHALAMIC STIMULATOR   Gram Stain     Final    Value: WOUND GRAM POSITIVE COCCI IN CLUSTERS WBC PRESENT, PREDOMINANTLY PMN     CALLED TO D. REES, RN 1100 03.14.13 JKILGORE   Report Status 05/22/2011 FINAL   Final   ANAEROBIC CULTURE     Status: Normal (Preliminary result)   Collection Time   05/22/11  9:20 AM      Component Value Range Status Comment   Specimen Description WOUND   Final    Special Requests     Final    Value: SUBTHALAMIC STIMULATOR REMOVAL INFECTED  IPG PT ON VANCO   Gram Stain     Final    Value: NO WBC SEEN     NO ORGANISMS SEEN     Performed at [facility]   Culture     Final    Value: NO ANAEROBES ISOLATED; CULTURE IN PROGRESS FOR 5 DAYS   Report Status PENDING   Incomplete   WOUND CULTURE     Status: Normal   Collection Time   05/22/11  9:20 AM      Component Value Range Status Comment   Specimen Description WOUND   Final    Special Requests     Final    Value: SUBTHALAMIC STIMULATOR REMOVAL INFECTED IPG PT ON VANCO   Gram Stain     Final    Value: NO WBC SEEN     NO ORGANISMS SEEN     Performed at [facility]   Culture NO GROWTH 2 DAYS   Final    Report Status 05/24/2011 FINAL   Final   GRAM STAIN     Status: Normal   Collection Time   05/22/11  9:20 AM      Component Value Range Status Comment   Specimen Description WOUND   Final    Special Requests     Final    Value: SUBTHALAMIC STIMULATOR REMOVAL INFECTED IPG PT ON VANCO   Gram Stain     Final    Value: NO ORGANISMS SEEN NO WBC SEEN NO YEAST OR FUNGAL ELEMENTS SEEN   Report Status 05/22/2011 FINAL   Final    Assessment: He continues to have some wound drainage. His Gram stain suggests staph infection so I will continue  vancomycin pending final cultures.  Plan: 1. Continue vancomycin   Cliffton Asters, MD [facility] [phone] pager   [phone] cell 05/24/2011, 12:59 PM

## 2011-05-24 NOTE — Progress Notes (Signed)
05/24/2011 Milana Kidney DPT PAGER: [phone] OFFICE: [phone]

## 2011-05-24 NOTE — Progress Notes (Signed)
Patient ID: [name], male   DOB: 1929/11/09, 78 y.o.   MRN: [id number] Stable, neck wound with staples ok. Some drainage in chest pocket. ID waiting for cultures

## 2011-05-24 NOTE — Progress Notes (Signed)
Vanc trough was due prior to am dose today but lab did not show for >1hr past due draw time and RN hung dose; will check with pm dose.  Vernard Gambles, PharmD, BCPS 05/24/2011 6:34 AM

## 2011-05-24 NOTE — Progress Notes (Addendum)
Physical Therapy Evaluation Patient Details Name: [name] MRN: [id number] DOB: 09/02/1929 Today's Date: 05/24/2011  Problem List:  Patient Active Problem List  Diagnoses  . HYPERLIPIDEMIA-MIXED  . PARKINSON'S DISEASE  . HYPERTENSION, BENIGN  . Coronary Atherosclerosis of Native Coronary Artery  . VENOUS INSUFFICIENCY  . Shortness of breath  . GASTROINTESTINAL HEMORRHAGE, HX OF  . ANEMIA  . FIBRILLATION, ATRIAL  . UMBILICAL HERNIA  . VENTRAL HERNIA  . DEGENERATIVE JOINT DISEASE  . GASTRIC ULCER, HX OF  . COLONIC POLYPS, HYPERPLASTIC, HX OF  . History of depression  . Dyspnea    Past Medical History:  Past Medical History  Diagnosis Date  . HYPERLIPIDEMIA-MIXED 02/10/2008  . PARKINSON'S DISEASE 02/10/2008  . HYPERTENSION, BENIGN 02/09/2009  . GASTROINTESTINAL HEMORRHAGE, HX OF 02/10/2008  . ANEMIA 04/29/2010  . FIBRILLATION, ATRIAL 04/29/2010  . VENTRAL HERNIA 04/29/2010  . DEGENERATIVE JOINT DISEASE 04/29/2010  . Gastric ulcer   . Positive PPD   . Internal hemorrhoids   . Hyperplastic colon polyp 2004  . CAD (coronary artery disease)     23.0 x 24 mm Taxus DES mid LAD 2005, DCA diagonal branch 2005  . VENOUS INSUFFICIENCY 02/10/2008    rt  . Shortness of breath   . Osteomyelitis   . Calculus of kidney   . Tuberculosis     78 yrs old  . GERD (gastroesophageal reflux disease)     ulcers hx   Past Surgical History:  Past Surgical History  Procedure Date  . Replacement total knee 2002    right  . Nose surgery   . Ankle surgery     left  . Toe amputation     right foot  . Cataract extraction   . Carpal tunnel release     left, open rt  . Tonsillectomy   . Appendectomy   . Coronary angioplasty with stent placement   . Back surgery 2006     back x3  . Deep brain stimulator placement 02    batteries changed 07  . Subthalamic stimulator battery replacement 02/27/2011    Procedure: SUBTHALAMIC STIMULATOR BATTERY REPLACEMENT;  Surgeon: Dorian Heckle, MD;   Location: [facility];  Service: Neurosurgery;  Laterality: Left;  DBS battery replacment x 2 - Left Chest and Abdomen  . Eye muscle surgery     bilat    05/24/11 0800  PT Visit Information  Last PT Received On 05/24/11  Restrictions  Weight Bearing Restrictions No  Home Living  Lives With Spouse  Receives Help From Family  Type of Home Apartment  Home Layout One level  Home Access Level entry  Bathroom Shower/Tub Tub/shower unit;Curtain  Bathroom Toilet Handicapped height  Bathroom Accessibility Yes  How Accessible Accessible via walker  Home Adaptive Equipment Walker - rolling;Wheelchair - manual;Grab bars around toilet;Grab bars in shower;Reacher;Hospital bed;Other (comment) (lift chair)  Prior Function  Level of Independence Needs assistance with ADLs;Independent with transfers;Independent with gait  Bath Minimal  Toileting Minimal  Dressing Minimal  Able to Take Stairs? 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 VC throughout for sequencing and hand placement. Pt able to initiate transfer without physical assist but had a difficulty with trunk and LE movement together  Sitting - Scoot to Edge of Bed 3: Mod assist  Sitting - Scoot to Edge of Bed Details (indicate cue type and reason) Pt able to initiate scooting movement but required cues and physical assist to weight shift. Drawsheet used for final scooting  Transfers  Transfers Yes  Sit to Stand 3: Mod assist;With upper  extremity assist;From bed;From elevated surface  Sit to Stand Details (indicate cue type and reason) VC for hand placement for standing, pt still stood up holding onto RW. VC and physical assist for anterior translation secondary to posterior lean upon standing  Stand to Sit 3: Mod assist;With upper extremity assist;To chair/3-in-1  Stand to Sit Details VC for hand placement and safety. Assist to control descent into chair  Ambulation/Gait  Ambulation/Gait Yes  Ambulation/Gait Assistance 3: Mod assist  Ambulation/Gait Assistance Details (indicate cue type and reason) Mod assist for stability as well as control of RW as pt with uncontrolled speed secondary to short shuffling steps. Pt required VCs throughout ambulation for increased step length as well as visual cues on the floor. VCs throughout for distance to RW as pt tends to hold RW far out in front or to the side.   Ambulation Distance (Feet) 50 Feet  Assistive device Rolling walker  Gait Pattern Step-to pattern;Trunk flexed;Shuffle;Decreased hip/knee flexion - right;Decreased hip/knee flexion - left;Lateral trunk lean to right;Decreased stride length;Decreased stance time - right;Decreased stance time - left  Static Sitting Balance  Static Sitting - Balance Support Feet supported;Bilateral upper extremity supported  Static Sitting - Level of Assistance 5: Stand by assistance;4: Min assist  Static Sitting - Comment/# of Minutes SBA to min assist at times secondary to difficulty maintaining feet supported on the ground and required assist to correct positioning to avoid lateral lean to the left (extremely stiff)  RLE Assessment  RLE Assessment WFL  LLE Assessment  LLE Assessment WFL  PT - End of Session  Equipment Utilized During Treatment Gait belt  Activity Tolerance Patient tolerated treatment well  Patient left in chair;with call bell in reach  Nurse Communication Mobility status for transfers;Mobility status for ambulation  General    Behavior During Session [facility] for tasks performed  Cognition [facility] for tasks performed  PT Assessment  Clinical Impression Statement Pt presents with a medical diagnosis of brain stimulator infection with a history of Parkinson's. Pt has decreased functional strength and endurance limiting his functional mobility. Pt will benefit from skilled PT in the acute care setting in order to maximize functional mobility prior to d/c  PT Recommendation/Assessment Patient will need skilled PT in the acute care venue  PT Problem List Decreased strength;Decreased balance;Decreased mobility;Decreased knowledge of use of DME  PT Therapy Diagnosis  Difficulty walking  PT Plan  PT Frequency Min 3X/week  PT Treatment/Interventions DME instruction;Gait training;Functional mobility training;Therapeutic activities;Therapeutic exercise;Balance training;Neuromuscular re-education;Patient/family education  PT Recommendation  Follow Up Recommendations SNF  Equipment Recommended None recommended by PT  Individuals Consulted  Consulted and Agree with Results and Recommendations Patient  Acute Rehab PT Goals  PT Goal Formulation With patient  Time For Goal Achievement 2 weeks  Pt will go Supine/Side to Sit with modified independence  PT Goal: Supine/Side to Sit - Progress Goal set today  Pt will go Sit to Stand with modified independence  PT Goal:  Sit to Stand - Progress Goal set today  Pt will go Stand to Sit with supervision  PT Goal: Stand to Sit - Progress Goal set today  Pt will Transfer Bed to Chair/Chair to Bed with supervision  PT Transfer Goal: Bed to Chair/Chair to Bed - Progress Goal set today  Pt will Ambulate 51 - 150 feet;with supervision;with rolling walker;with cues (comment type and amount) (min cues for decreased shuffling)  PT Goal: Ambulate - Progress Goal set today  Pt will Perform Home Exercise Program Independently  PT Goal: Perform Home Exercise Program - Progress Goal set today     05/24/2011 Milana Kidney DPT PAGER: [phone] OFFICE: [phone]

## 2011-05-24 NOTE — Evaluation (Addendum)
Occupational Therapy Evaluation Patient Details Name: [name] MRN: [id number] DOB: [birth year] Today's Date: 05/24/2011  Problem List:  Patient Active Problem List  Diagnoses  . HYPERLIPIDEMIA-MIXED  . PARKINSON'S DISEASE  . HYPERTENSION, BENIGN  . Coronary Atherosclerosis of Native Coronary Artery  . VENOUS INSUFFICIENCY  . Shortness of breath  . GASTROINTESTINAL HEMORRHAGE, HX OF  . ANEMIA  . FIBRILLATION, ATRIAL  . UMBILICAL HERNIA  . VENTRAL HERNIA  . DEGENERATIVE JOINT DISEASE  . GASTRIC ULCER, HX OF  . COLONIC POLYPS, HYPERPLASTIC, HX OF  . History of depression  . Dyspnea    Past Medical History:  Past Medical History  Diagnosis Date  . HYPERLIPIDEMIA-MIXED 02/10/2008  . PARKINSON'S DISEASE 02/10/2008  . HYPERTENSION, BENIGN 02/09/2009  . GASTROINTESTINAL HEMORRHAGE, HX OF 02/10/2008  . ANEMIA 04/29/2010  . FIBRILLATION, ATRIAL 04/29/2010  . VENTRAL HERNIA 04/29/2010  . DEGENERATIVE JOINT DISEASE 04/29/2010  . Gastric ulcer   . Positive PPD   . Internal hemorrhoids   . Hyperplastic colon polyp 2004  . CAD (coronary artery disease)     23.0 x 24 mm Taxus DES mid LAD 2005, DCA diagonal branch 2005  . VENOUS INSUFFICIENCY 02/10/2008    rt  . Shortness of breath   . Osteomyelitis   . Calculus of kidney   . Tuberculosis     78 yrs old  . GERD (gastroesophageal reflux disease)     ulcers hx   Past Surgical History:  Past Surgical History  Procedure Date  . Replacement total knee 2002    right  . Nose surgery   . Ankle surgery     left  . Toe amputation     right foot  . Cataract extraction   . Carpal tunnel release     left, open rt  . Tonsillectomy   . Appendectomy   . Coronary angioplasty with stent placement   . Back surgery 2006     back x3  . Deep brain stimulator placement 02    batteries changed 07  . Subthalamic stimulator battery replacement 02/27/2011    Procedure: SUBTHALAMIC STIMULATOR BATTERY REPLACEMENT;  Surgeon: Dorian Heckle, MD;   Location: [facility];  Service: Neurosurgery;  Laterality: Left;  DBS battery replacment x 2 - Left Chest and Abdomen  . Eye muscle surgery     bilat    OT Assessment/Plan/Recommendation OT Assessment Clinical Impression Statement: 78 yo male currently admitted due to complications with deep brain stimulator. Pt currently on antibotics for treatment. Pt has baseline Parkinson's disease and currently with decreased mobility. Recommend d/c home with HHOT. OT Recommendation/Assessment: Patient will need skilled OT in the acute care venue OT Problem List: Decreased strength;Decreased activity tolerance;Impaired balance (sitting and/or standing);Decreased coordination;Decreased knowledge of use of DME or AE OT Therapy Diagnosis : Generalized weakness OT Plan OT Frequency: Min 2X/week OT Treatment/Interventions: Self-care/ADL training;Neuromuscular education;Therapeutic exercise;DME and/or AE instruction;Therapeutic activities;Patient/family education;Balance training OT Recommendation Follow Up Recommendations: SNF  Equipment Recommended: Defer to next venue Individuals Consulted Consulted and Agree with Results and Recommendations: Patient OT Goals Acute Rehab OT Goals OT Goal Formulation: With patient Time For Goal Achievement: 2 weeks ADL Goals Pt Will Perform Grooming: with supervision;Standing at sink;Unsupported ADL Goal: Grooming - Progress: Goal set today Pt Will Perform Upper Body Bathing: with set-up;Sitting, chair;Sitting, edge of bed;Supported ADL Goal: Upper Body Bathing - Progress: Goal set today Pt Will Perform Upper Body Dressing: with set-up;Sitting, chair;Sitting, bed;Supported ADL Goal: Upper Body Dressing - Progress: Goal set  today Pt Will Transfer to Toilet: with supervision;3-in-1 ADL Goal: Toilet Transfer - Progress: Goal set today Pt Will Perform Toileting - Clothing Manipulation: with supervision;Sitting on 3-in-1 or toilet ADL Goal: Toileting - Clothing  Manipulation - Progress: Goal set today Pt Will Perform Toileting - Hygiene: with supervision;Sit to stand from 3-in-1/toilet ADL Goal: Toileting - Hygiene - Progress: Goal set today Miscellaneous OT Goals Miscellaneous OT Goal #1: Pt will complete bed mobility at supervision level as precursor to ADLS OT Goal: Miscellaneous Goal #1 - Progress: Goal set today  OT Evaluation Precautions/Restrictions  Precautions Precautions: Fall Required Braces or Orthoses: No Restrictions Weight Bearing Restrictions: No Prior Functioning Home Living Lives With: Spouse Receives Help From: Family Type of Home: Apartment Home Layout: One level Home Access: Level entry Bathroom Shower/Tub: Tub/shower unit;Curtain Bathroom Toilet: Handicapped height Bathroom Accessibility: Yes How Accessible: Accessible via walker Home Adaptive Equipment: Walker - rolling;Wheelchair - manual;Grab bars around toilet;Grab bars in shower;Reacher;Hospital bed;Other (comment) (lift chair) Prior Function Level of Independence: Needs assistance with ADLs;Independent with transfers;Independent with gait Bath: Minimal Toileting: Minimal Dressing: Minimal Able to Take Stairs?: No Driving: No Vocation: Retired Leisure: Hobbies-yes (Comment) Comments: photography ADL ADL Eating/Feeding: Performed;Set up Where Assessed - Eating/Feeding: Chair Grooming: Simulated;Wash/dry hands;Minimal assistance Where Assessed - Grooming: Sitting, chair;Supported Location manager Dressing: Performed;Minimal assistance Where Assessed - Upper Body Dressing: Sitting, bed;Unsupported Lower Body Dressing: Performed;+1 Total assistance Lower Body Dressing Details (indicate cue type and reason): pt states "my wife helps me at home" Pt with Rt foot amputation of toes and provided shoes for ambulation Where Assessed - Lower Body Dressing: Sitting, bed;Unsupported Toilet Transfer: Simulated;Moderate assistance Toilet Transfer Method:  Proofreader: Raised toilet seat with arms (or 3-in-1 over toilet) Equipment Used: Rolling walker Ambulation Related to ADLs: Pt ambulating into the hall way with max v/c for positioning in the walker. Pt extending BIL UE and walking outside of RW frame.Pt with short shuffled gait. Pt provided visual and auditory cues to increase gait length. Pt c/o UE pain with ambulation due to UE support on RW due to LE weakness ADL Comments: Pt with poor sitting balance. pt required support of BIL UE on bed for support. Pt prop sitting at EOB for don of each shoe. Pt requires (A) with Adls at baseline from wife. Vision/Perception  Vision - History Baseline Vision: No visual deficits Cognition Cognition Arousal/Alertness: Awake/alert Overall Cognitive Status: Appears within functional limits for tasks assessed Orientation Level: Oriented X4 Sensation/Coordination Sensation Light Touch: Appears Intact Coordination Gross Motor Movements are Fluid and Coordinated: No Extremity Assessment RUE Assessment RUE Assessment: Within Functional Limits (grossly) LUE Assessment LUE Assessment: Within Functional Limits (grossly) Mobility  Bed Mobility Bed Mobility: Yes Supine to Sit: 3: Mod assist;HOB elevated (Comment degrees);With rails Sitting - Scoot to Edge of Bed: 3: Mod assist Transfers Sit to Stand: 3: Mod assist;With upper extremity assist;From bed;From elevated surface Stand to Sit: 3: Mod assist;With upper extremity assist;To chair/3-in-1 Exercises   End of Session OT - End of Session Equipment Utilized During Treatment: Gait belt Activity Tolerance: Patient tolerated treatment well Patient left: in chair;with call bell in reach Nurse Communication: Mobility status for transfers;Mobility status for ambulation General Behavior During Session: [facility] for tasks performed Cognition: [facility] for tasks performed   Silviano, [place] 05/24/2011, 1:09 PM  Pager: [phone]

## 2011-05-24 NOTE — Progress Notes (Signed)
ANTIBIOTIC CONSULT NOTE - FOLLOW UP  Pharmacy Consult for vancomycin Indication: pocket infection from neurostimulator   Allergies  Allergen Reactions  . Morphine   . Risperidone And Related     Patient Measurements: Height: 5\' 9"  (175.3 cm) Weight: 209 lb (94.802 kg) IBW/kg (Calculated) : 70.7    Vital Signs: Temp: 98.8 F (37.1 C) (03/16 1835) Temp src: Oral (03/16 1835) BP: 91/58 mmHg (03/16 1835) Pulse Rate: 91  (03/16 1835)  Labs: No results found for this basename: WBC:3,HGB:3,PLT:3,LABCREA:3,CREATININE:3, in the last 72 hours Estimated Creatinine Clearance: 48.6 ml/min (by C-G formula based on Cr of 1.4).  Basename 05/24/11 1737  VANCOTROUGH 16.2  VANCOPEAK --  VANCORANDOM --  GENTTROUGH --  GENTPEAK --  GENTRANDOM --  TOBRATROUGH --  TOBRAPEAK --  TOBRARND --  AMIKACINPEAK --  AMIKACINTROU --  AMIKACIN --      Assessment: 78 yo who is s/p removal of infected subthalamic stimulator and noted with GPC/clusters in wound cultures. Vancomycin trough is 16.3.  Goal of Therapy:  Vancomycin trough level 15-20 mcg/ml  Plan:  -No vancomycin dose changes needed  [name] 05/24/2011,7:14 PM

## 2011-05-25 NOTE — Progress Notes (Signed)
Filed Vitals:   05/24/11 2032 05/25/11 0244 05/25/11 0552 05/25/11 0900  BP: 135/71 134/83 169/84 110/74  Pulse: 70 81 70 90  Temp: 98.2 F (36.8 C) 97.7 F (36.5 C) 97.7 F (36.5 C) 98 F (36.7 C)  TempSrc: Oral Oral Oral Oral  Resp: 20 20 20 18   Height:      Weight:      SpO2: 96% 94% 100% 96%    Patient resting comfortably. Continues on antibiotics. ID continued to follow the patient.  Plan: Continue antibiotics  Hewitt Shorts, MD 05/25/2011, 1:01 PM

## 2011-05-25 NOTE — Progress Notes (Signed)
Patient ID: ZYRION COEY, male   DOB: [birth year], 78 y.o.   MRN: [id number]  INFECTIOUS DISEASE PROGRESS NOTE    Date of Admission:  05/22/2011           Day 4 vancomycin  Objective: Temp:  [97.7 F (36.5 C)-98.8 F (37.1 C)] 98 F (36.7 C) (03/17 0900) Pulse Rate:  [70-91] 90  (03/17 0900) Resp:  [18-20] 18  (03/17 0900) BP: (91-169)/(58-89) 110/74 mmHg (03/17 0900) SpO2:  [94 %-100 %] 96 % (03/17 0900)   Microbiology: Generator pocket cultures growing multiple organisms  Assessment: I've asked the lab to speciate the multiple organisms growing from the generator pocket.  Plan: 1. Continue vancomycin pending final cultures   Cliffton Asters, MD [facility] [phone] pager   [phone] cell 05/25/2011, 12:22 PM

## 2011-05-26 LAB — BASIC METABOLIC PANEL
BUN: 24 mg/dL — ABNORMAL HIGH (ref 6–23)
CO2: 22 mEq/L (ref 19–32)
GFR calc non Af Amer: 49 mL/min — ABNORMAL LOW (ref >90)
Glucose, Bld: 95 mg/dL (ref 70–99)
Potassium: 4.9 mEq/L (ref 3.5–5.1)

## 2011-05-26 LAB — CBC
Hemoglobin: 12.5 g/dL — ABNORMAL LOW (ref 13.0–17.0)
MCH: 31 pg (ref 26.0–34.0)
MCHC: 33.3 g/dL (ref 30.0–36.0)
MCV: 93.1 fL (ref 78.0–100.0)

## 2011-05-26 MED ORDER — DIPHENHYDRAMINE HCL 25 MG PO CAPS
25.0000 mg | ORAL_CAPSULE | Freq: Four times a day (QID) | ORAL | Status: DC | PRN
  Administered 2011-05-26 – 2011-05-27 (×3): 25 mg via ORAL
  Filled 2011-05-26 (×4): qty 1

## 2011-05-26 MED ORDER — DIPHENHYDRAMINE HCL 50 MG/ML IJ SOLN: 25.0000 mg | Freq: Four times a day (QID) | INTRAMUSCULAR | Status: DC | PRN

## 2011-05-26 MED ORDER — SENNOSIDES-DOCUSATE SODIUM 8.6-50 MG PO TABS
1.0000 | ORAL_TABLET | Freq: Every evening | ORAL | Status: DC | PRN
  Administered 2011-05-26: 1 via ORAL
  Filled 2011-05-26: qty 1

## 2011-05-26 MED ORDER — FLEET ENEMA 7-19 GM/118ML RE ENEM
1.0000 | ENEMA | Freq: Every day | RECTAL | Status: DC | PRN
  Administered 2011-05-27: 1 via RECTAL
  Filled 2011-05-26: qty 1

## 2011-05-26 NOTE — Progress Notes (Signed)
05/26/11 0600 Nursing:  Patient's BP manually was 182/100, HR 105, asymptomatic. Dr Jeral Fruit notified - no orders given at this time - stated he will have the Hospitalists see the patient this morning.  Will continue to monitor.  Casey Burkitt , RN

## 2011-05-26 NOTE — Telephone Encounter (Signed)
Again left a message on home VM

## 2011-05-26 NOTE — Progress Notes (Signed)
Subjective: Patient reports "a bit shaky."  Objective: Vital signs in last 24 hours: Temp:  [97.6 F (36.4 C)-98.7 F (37.1 C)] 98.7 F (37.1 C) (03/18 0555) Pulse Rate:  [72-104] 104  (03/18 0555) Resp:  [17-18] 18  (03/18 0555) BP: (110-173)/(62-100) 173/98 mmHg (03/18 0555) SpO2:  [93 %-98 %] 96 % (03/18 0555)  Intake/Output from previous day: 03/17 0701 - 03/18 0700 In: 7391.3 [I.V.:6191.3; IV Piggyback:1200] Out: -  Intake/Output this shift:    Physical Exam: Mildly dyskinetic, better than before weekend.  Dressings CDI  Lab Results:  Basename 05/26/11 0630  WBC 9.0  HGB 12.5*  HCT 37.5*  PLT 183   BMET No results found for this basename: NA:2,K:2,CL:2,CO2:2,GLUCOSE:2,BUN:2,CREATININE:2,CALCIUM:2 in the last 72 hours  Studies/Results: No results found.  Assessment/Plan: Awaiting final speciation of cultures before discharging home on antibiotics, per ID.    LOS: 4 days    Dorian Heckle, MD 05/26/2011, 7:29 AM

## 2011-05-26 NOTE — Progress Notes (Signed)
Clinical Social Work Department BRIEF PSYCHOSOCIAL ASSESSMENT 05/26/2011  Patient:  [name], [name]     Account Number:  [id number]     Admit date:  05/22/2011  Clinical Social Worker:  Peggyann Shoals  Date/Time:  05/26/2011 01:00 PM  Referred by:  Care Management  Date Referred:  05/26/2011 Referred for  SNF Placement   Other Referral:   Interview type:  Family Other interview type:    PSYCHOSOCIAL DATA Living Status:  WIFE Admitted from facility:   Level of care:   Primary support name:  [name] Primary support relationship to patient:  SPOUSE Degree of support available:   Very supportive.  [phone] (cell)  [phone] (home)    CURRENT CONCERNS Current Concerns  Post-Acute Placement   Other Concerns:    SOCIAL WORK ASSESSMENT / PLAN CSW met with pt and wife to address consult. CSW introduced herself and explained role of social work. Pt lives at home with his wife in [place]. Pt has Parkinson's. Pt's wife shared that pt would benefit from short term rehab at a SNF prior to returning home to build up this strength. Pt's wife shared that pt has been to a SNF in the past and had home health services. Pt's wife provides most of pt's care. Pt shared that he is agreeable to SNF placement for short term rehab.   Assessment/plan status:  Other - See comment Other assessment/ plan:   Discharge to SNF:  CSW will complete FL2 and send request to [facility].  CSW will follow up with bed offers.  CSW will facilitate discharge to SNF when medically ready.   Information/referral to community resources:   as needed.    PATIENT'S/FAMILY'S RESPONSE TO PLAN OF CARE: Pt is alert and oriented. Pt's speech is slightly impaired, however is able to communicate. Pt is agreeable to SNF placement.

## 2011-05-26 NOTE — Progress Notes (Signed)
Physical Therapy Treatment Patient Details Name: [name] MRN: [id number] DOB: [birth year] Today's Date: 05/26/2011  PT Assessment/Plan  PT - Assessment/Plan Comments on Treatment Session: Pt presents with a medical diagnosis of brain stimulator infection with a history of Parkinson's. Pt making good ambulatory gains however remains weakened from recent episode. Wife in room for session and able to assist pt with sit to stand and during gait however  does not feel comfortable taking pt immediately home from hospital secondary increased weakness and risk for falls. Pt and wife requesting ST-SNF stay.  PT Plan: Discharge plan remains appropriate PT Frequency: Min 3X/week Follow Up Recommendations: Skilled nursing facility;Supervision/Assistance - 24 hour Equipment Recommended: None recommended by PT PT Goals  Acute Rehab PT Goals Pt will go Supine/Side to Sit: with modified independence PT Goal: Supine/Side to Sit - Progress: Progressing toward goal Pt will go Sit to Stand: with modified independence PT Goal: Sit to Stand - Progress: Progressing toward goal Pt will go Stand to Sit: with supervision PT Goal: Stand to Sit - Progress: Progressing toward goal Pt will Transfer Bed to Chair/Chair to Bed: with supervision PT Transfer Goal: Bed to Chair/Chair to Bed - Progress: Progressing toward goal Pt will Ambulate: 51 - 150 feet;with supervision;with rolling walker;with cues (comment type and amount) (min cues for decreased shuffling) PT Goal: Ambulate - Progress: Progressing toward goal Pt will Perform Home Exercise Program: Independently PT Goal: Perform Home Exercise Program - Progress: Progressing toward goal  PT Treatment Precautions/Restrictions  Precautions Precautions: Fall Required Braces or Orthoses: No Restrictions Weight Bearing Restrictions: No Mobility (including Balance) Bed Mobility Bed Mobility: No Supine to Sit: HOB elevated (Comment degrees);With rails;5:  Supervision (HOB = 30 degrees) Supine to Sit Details (indicate cue type and reason): VC throughout for sequencing and hand placement. Pt with non-efficient movements and decreased coordination however was able to obtain upright sitting without physical assist. Sitting - Scoot to Edge of Bed: 5: Supervision Sitting - Scoot to Edge of Bed Details (indicate cue type and reason): Verbal cues to perform, pt having difficulty with however able to complete with increased time.  Transfers Transfers: Yes Sit to Stand: 3: Mod assist;With upper extremity assist;From elevated surface;From chair/3-in-1;With armrests Sit to Stand Details (indicate cue type and reason): pt with v/c for hand placement Stand to Sit: With upper extremity assist;To chair/3-in-1;4: Min assist Stand to Sit Details: verbal cue for controlling descend and hand placement Ambulation/Gait Ambulation/Gait: Yes Ambulation/Gait Assistance: 4: Min assist Ambulation/Gait Assistance Details (indicate cue type and reason): Assist for stability and proximity of RW. Verbal cues for improved step length and upright posture. Extra support for negotiation of RW required while turning.  Ambulation Distance (Feet): 200 Feet Assistive device: Rolling walker Gait Pattern: Step-to pattern;Trunk flexed;Shuffle;Decreased hip/knee flexion - right;Decreased hip/knee flexion - left;Decreased stride length Stairs: No    Exercise  General Exercises - Lower Extremity Ankle Circles/Pumps: AROM;5 reps;Both;Supine Heel Slides: AROM;Both;10 reps;Supine;AAROM Hip ABduction/ADduction: AROM;AAROM;Both;10 reps;Supine End of Session PT - End of Session Equipment Utilized During Treatment: Gait belt Activity Tolerance: Patient tolerated treatment well Patient left: in chair;with call bell in reach Nurse Communication: Mobility status for transfers;Mobility status for ambulation General Behavior During Session: [facility] for tasks performed Cognition: [facility] for tasks  performed  Wilhemina Bonito 05/26/2011, 11:05 AM  Sherie Don) Carleene Mains PT, DPT Acute Rehabilitation [phone]

## 2011-05-26 NOTE — Progress Notes (Signed)
Occupational Therapy Treatment Patient Details Name: [name] MRN: [id number] DOB: 08/14/1929 Today's Date: 05/26/2011  OT Assessment/Plan OT Assessment/Plan Equipment Recommended: None recommended by OT OT Goals ADL Goals Pt Will Perform Grooming: with supervision;Standing at sink;Unsupported ADL Goal: Grooming - Progress: Progressing toward goals Pt Will Transfer to Toilet: with supervision;3-in-1 ADL Goal: Toilet Transfer - Progress: Progressing toward goals  OT Treatment Precautions/Restrictions  Precautions Precautions: Fall Required Braces or Orthoses: No Restrictions Weight Bearing Restrictions: No   ADL ADL Grooming: Performed;Teeth care (min guard (A)) Where Assessed - Grooming: Standing at sink (PASTA APPLIED TO TOOTH BRUSH IN SITTING) Toilet Transfer: Simulated;Moderate assistance Toilet Transfer Method: Proofreader: Raised toilet seat with arms (or 3-in-1 over toilet) Equipment Used: Rolling walker Ambulation Related to ADLs: Pt ambulating with PT [name]) on arrival. Pt observed walking >114ft with PT. Pt required Mod v/c for positioning in RW and upright posture ADL Comments: Pt s/p ambulation and positioned in chair for restbreak. Pt with rest break ~1 minute. Pt applying tooth paste to brush in sitting position. Pt sit<>stand with Mod (A) from chair . Pt required Mod (A) to navigate RW to sink level. Pt with static standing at sink for oral care Min guard (A). Pt and wife both reporting that is weaker than normal but functionally approaching baseline . Pt currently with decreased activity tolerance.  Mobility  Bed Mobility Bed Mobility: No Supine to Sit: HOB elevated (Comment degrees);With rails;5: Supervision (HOB = 30 degrees) Supine to Sit Details (indicate cue type and reason): VC throughout for sequencing and hand placement. Pt with non-efficient movements and decreased coordination however was able to obtain upright sitting  without physical assist. Sitting - Scoot to Edge of Bed: 5: Supervision Sitting - Scoot to Edge of Bed Details (indicate cue type and reason): Verbal cues to perform, pt having difficulty with however able to complete with increased time.  Transfers Transfers: Yes Sit to Stand: 3: Mod assist;With upper extremity assist;From elevated surface;From chair/3-in-1;With armrests Sit to Stand Details (indicate cue type and reason): pt with v/c for hand placement Stand to Sit: With upper extremity assist;To chair/3-in-1;4: Min assist Stand to Sit Details: verbal cue for controlling descend and hand placement  End of Session OT - End of Session Equipment Utilized During Treatment: Gait belt Activity Tolerance: Patient tolerated treatment well Patient left: in chair;with call bell in reach Nurse Communication: Mobility status for transfers;Mobility status for ambulation General Behavior During Session: [name] for tasks performed Cognition: [name] for tasks performed  [name], [name] [facility] [name]  05/26/2011, 11:06 AM Pager: [phone]

## 2011-05-26 NOTE — Progress Notes (Signed)
INFECTIOUS DISEASE PROGRESS NOTE    Date of Admission:  05/22/2011   Total days of antibiotics 5        Day 5 vancomycin               Active Problems:  * No active hospital problems. *       . amantadine  100 mg Oral BID  . aspirin EC  81 mg Oral Daily  . B-complex with vitamin C  1 tablet Oral Daily  . carbidopa-levodopa  1 tablet Oral 6 X Daily  . meloxicam  15 mg Oral Daily  . methocarbamol  500 mg Oral QID  . midodrine  5 mg Oral BID  . pantoprazole  40 mg Oral Q1200  . PARoxetine  20 mg Oral Daily  . polyethylene glycol  17 g Oral Daily  . pramipexole  0.5 mg Oral 6 X Daily  . saccharomyces boulardii  250 mg Oral Daily  . simvastatin  40 mg Oral QHS  . sodium chloride  3 mL Intravenous Q12H  . vancomycin  750 mg Intravenous Q12H    Subjective: Shaking due to the Parkinson's  Objective: Temp:  [97.6 F (36.4 C)-98.7 F (37.1 C)] 97.9 F (36.6 C) (03/18 1409) Pulse Rate:  [66-104] 71  (03/18 1409) Resp:  [17-18] 18  (03/18 1409) BP: (117-173)/(61-100) 164/91 mmHg (03/18 1409) SpO2:  [93 %-100 %] 96 % (03/18 1409)  General: Awake, alert, nad Skin: no rashesm, pocket site covered, non tender Lungs: CTA B Cor: RRR   Lab Results Lab Results  Component Value Date   WBC 9.0 05/26/2011   HGB 12.5* 05/26/2011   HCT 37.5* 05/26/2011   MCV 93.1 05/26/2011   PLT 183 05/26/2011    Lab Results  Component Value Date   CREATININE 1.33 05/26/2011   BUN 24* 05/26/2011   NA 135 05/26/2011   K 4.9 05/26/2011   CL 102 05/26/2011   CO2 22 05/26/2011    Lab Results  Component Value Date   ALT 7 05/21/2011   AST 40* 05/21/2011   ALKPHOS 88 05/21/2011   BILITOT 0.5 05/21/2011       Microbiology: Recent Results (from the past 240 hour(s))  SURGICAL PCR SCREEN     Status: Normal   Collection Time   05/22/11  6:44 AM      Component Value Range Status Comment   MRSA, PCR NEGATIVE  NEGATIVE  Final    Staphylococcus aureus NEGATIVE  NEGATIVE  Final   ANAEROBIC CULTURE      Status: Normal (Preliminary result)   Collection Time   05/22/11  9:11 AM      Component Value Range Status Comment   Specimen Description WOUND   Final    Special Requests     Final    Value: ON SWAB INFECTION OF IPG DEVICE IN CHEST WALL SUBTHALAMIC STIMULATOR   Gram Stain     Final    Value: RARE WBC PRESENT, PREDOMINANTLY PMN     RARE GRAM POSITIVE COCCI IN CLUSTERS     Gram Stain Report Called to,Read Back By and Verified With: Gram Stain Report Called to,Read Back By and Verified With: D REES RN 1100 05/22/11 [place] Performed at [facility]   Culture     Final    Value: NO ANAEROBES ISOLATED; CULTURE IN PROGRESS FOR 5 DAYS   Report Status PENDING   Incomplete   WOUND CULTURE     Status: Normal (Preliminary result)  Collection Time   05/22/11  9:11 AM      Component Value Range Status Comment   Specimen Description WOUND   Final    Special Requests     Final    Value: ON SWAB INFECTION OF IPG DEVICE IN CHEST WALL SUBTHALAMIC STIMULATOR   Gram Stain     Final    Value: RARE WBC PRESENT, PREDOMINANTLY PMN     RARE GRAM POSITIVE COCCI IN CLUSTERS     Ladell Pier REES RN 1100 05/22/11 JKILGORE Performed at [facility]   Culture     Final    Value: FEW STAPHYLOCOCCUS SPECIES (COAGULASE NEGATIVE)     Note: RIFAMPIN AND GENTAMICIN SHOULD NOT BE USED AS SINGLE DRUGS FOR TREATMENT OF STAPH INFECTIONS.   Report Status PENDING   Incomplete   GRAM STAIN     Status: Normal   Collection Time   05/22/11  9:11 AM      Component Value Range Status Comment   Specimen Description WOUND   Final    Special Requests     Final    Value: ON SWAB INFECTION OF IPG DEVICE IN CHEST WALL SUBTHALAMIC STIMULATOR   Gram Stain     Final    Value: WOUND GRAM POSITIVE COCCI IN CLUSTERS WBC PRESENT, PREDOMINANTLY PMN     CALLED TO D. REES, RN 1100 03.14.13 JKILG[place]   Report Status 05/22/2011 FINAL   Final   ANAEROBIC CULTURE     Status: Normal (Preliminary result)   Collection Time   05/22/11  9:20  AM      Component Value Range Status Comment   Specimen Description WOUND   Final    Special Requests     Final    Value: SUBTHALAMIC STIMULATOR REMOVAL INFECTED IPG PT ON VANCO   Gram Stain     Final    Value: NO WBC SEEN     NO ORGANISMS SEEN     Performed at [facility]   Culture     Final    Value: NO ANAEROBES ISOLATED; CULTURE IN PROGRESS FOR 5 DAYS   Report Status PENDING   Incomplete   WOUND CULTURE     Status: Normal   Collection Time   05/22/11  9:20 AM      Component Value Range Status Comment   Specimen Description WOUND   Final    Special Requests     Final    Value: SUBTHALAMIC STIMULATOR REMOVAL INFECTED IPG PT ON VANCO   Gram Stain     Final    Value: NO WBC SEEN     NO ORGANISMS SEEN     Performed at [facility]   Culture NO GROWTH 2 DAYS   Final    Report Status 05/24/2011 FINAL   Final   GRAM STAIN     Status: Normal   Collection Time   05/22/11  9:20 AM      Component Value Range Status Comment   Specimen Description WOUND   Final    Special Requests     Final    Value: SUBTHALAMIC STIMULATOR REMOVAL INFECTED IPG PT ON VANCO   Gram Stain     Final    Value: NO ORGANISMS SEEN NO WBC SEEN NO YEAST OR FUNGAL ELEMENTS SEEN   Report Status 05/22/2011 FINAL   Final     Studies/Results: No results found.  Micro: Wound with coag neg Staph  Assessment: 78 yo with pocket infection.  Plan: 1. Pocket infection - awaiting final ID, should be available tomorrow afternoon.  Continue vancomycin.

## 2011-05-27 ENCOUNTER — Encounter (HOSPITAL_COMMUNITY): Payer: Self-pay | Admitting: Neurosurgery

## 2011-05-27 LAB — WOUND CULTURE

## 2011-05-27 LAB — ANAEROBIC CULTURE

## 2011-05-27 MED ORDER — SODIUM CHLORIDE 0.9 % IV SOLN
570.0000 mg | INTRAVENOUS | Status: DC
  Administered 2011-05-27 – 2011-05-29 (×3): 570 mg via INTRAVENOUS
  Filled 2011-05-27 (×5): qty 11.4

## 2011-05-27 NOTE — Progress Notes (Addendum)
INFECTIOUS DISEASE PROGRESS NOTE    Date of Admission:  05/22/2011   Total days of antibiotics 6        Day 6 vancomycin               Active Problems:  * No active hospital problems. *       . amantadine  100 mg Oral BID  . aspirin EC  81 mg Oral Daily  . B-complex with vitamin C  1 tablet Oral Daily  . carbidopa-levodopa  1 tablet Oral 6 X Daily  . meloxicam  15 mg Oral Daily  . methocarbamol  500 mg Oral QID  . midodrine  5 mg Oral BID  . pantoprazole  40 mg Oral Q1200  . PARoxetine  20 mg Oral Daily  . polyethylene glycol  17 g Oral Daily  . pramipexole  0.5 mg Oral 6 X Daily  . saccharomyces boulardii  250 mg Oral Daily  . simvastatin  40 mg Oral QHS  . sodium chloride  3 mL Intravenous Q12H  . vancomycin  750 mg Intravenous Q12H    Subjective: Shaking due to the Parkinson's but otherwise no complaints  Objective: Temp:  [97.4 F (36.3 C)-97.9 F (36.6 C)] 97.4 F (36.3 C) (03/19 0945) Pulse Rate:  [71-88] 82  (03/19 0945) Resp:  [16-19] 18  (03/19 0945) BP: (95-164)/(58-91) 104/67 mmHg (03/19 0945) SpO2:  [93 %-98 %] 97 % (03/19 0945)  General: Awake, alert, nad Skin: some rash on face, pocket site covered, non tender Lungs: CTA B Cor: RRR   Lab Results Lab Results  Component Value Date   WBC 9.0 05/26/2011   HGB 12.5* 05/26/2011   HCT 37.5* 05/26/2011   MCV 93.1 05/26/2011   PLT 183 05/26/2011    Lab Results  Component Value Date   CREATININE 1.33 05/26/2011   BUN 24* 05/26/2011   NA 135 05/26/2011   K 4.9 05/26/2011   CL 102 05/26/2011   CO2 22 05/26/2011    Lab Results  Component Value Date   ALT 7 05/21/2011   AST 40* 05/21/2011   ALKPHOS 88 05/21/2011   BILITOT 0.5 05/21/2011       Microbiology: Recent Results (from the past 240 hour(s))  SURGICAL PCR SCREEN     Status: Normal   Collection Time   05/22/11  6:44 AM      Component Value Range Status Comment   MRSA, PCR NEGATIVE  NEGATIVE  Final    Staphylococcus aureus NEGATIVE  NEGATIVE   Final   ANAEROBIC CULTURE     Status: Normal   Collection Time   05/22/11  9:11 AM      Component Value Range Status Comment   Specimen Description WOUND   Final    Special Requests     Final    Value: ON SWAB INFECTION OF IPG DEVICE IN CHEST WALL SUBTHALAMIC STIMULATOR   Gram Stain     Final    Value: RARE WBC PRESENT, PREDOMINANTLY PMN     RARE GRAM POSITIVE COCCI IN CLUSTERS     Gram Stain Report Called to,Read Back By and Verified With: Gram Stain Report Called to,Read Back By and Verified With: D REES RN 1100 05/22/11 J[place] Performed at [facility]   Culture NO ANAEROBES ISOLATED   Final    Report Status 05/27/2011 FINAL   Final   WOUND CULTURE     Status: Normal   Collection Time   05/22/11  9:11 AM  Component Value Range Status Comment   Specimen Description WOUND   Final    Special Requests     Final    Value: ON SWAB INFECTION OF IPG DEVICE IN CHEST WALL SUBTHALAMIC STIMULATOR   Gram Stain     Final    Value: RARE WBC PRESENT, PREDOMINANTLY PMN     RARE GRAM POSITIVE COCCI IN CLUSTERS     Ladell Pier REES RN 1100 05/22/11 JKILGORE Performed at [facility]   Culture     Final    Value: FEW STAPHYLOCOCCUS SPECIES (COAGULASE NEGATIVE)     Note: RIFAMPIN AND GENTAMICIN SHOULD NOT BE USED AS SINGLE DRUGS FOR TREATMENT OF STAPH INFECTIONS. Two isolates with different morphologies were identified as the same organism.The most resistant organism was reported.   Report Status 05/27/2011 FINAL   Final    Organism ID, Bacteria STAPHYLOCOCCUS SPECIES (COAGULASE NEGATIVE)   Final   GRAM STAIN     Status: Normal   Collection Time   05/22/11  9:11 AM      Component Value Range Status Comment   Specimen Description WOUND   Final    Special Requests     Final    Value: ON SWAB INFECTION OF IPG DEVICE IN CHEST WALL SUBTHALAMIC STIMULATOR   Gram Stain     Final    Value: WOUND GRAM POSITIVE COCCI IN CLUSTERS WBC PRESENT, PREDOMINANTLY PMN     CALLED TO D. REES, RN 1100  03.14.13 JKILGORE   Report Status 05/22/2011 FINAL   Final   ANAEROBIC CULTURE     Status: Normal   Collection Time   05/22/11  9:20 AM      Component Value Range Status Comment   Specimen Description WOUND   Final    Special Requests     Final    Value: SUBTHALAMIC STIMULATOR REMOVAL INFECTED IPG PT ON VANCO   Gram Stain     Final    Value: NO WBC SEEN     NO ORGANISMS SEEN     Performed at [facility]   Culture NO ANAEROBES ISOLATED   Final    Report Status 05/27/2011 FINAL   Final   WOUND CULTURE     Status: Normal   Collection Time   05/22/11  9:20 AM      Component Value Range Status Comment   Specimen Description WOUND   Final    Special Requests     Final    Value: SUBTHALAMIC STIMULATOR REMOVAL INFECTED IPG PT ON VANCO   Gram Stain     Final    Value: NO WBC SEEN     NO ORGANISMS SEEN     Performed at [facility]   Culture NO GROWTH 2 DAYS   Final    Report Status 05/24/2011 FINAL   Final   GRAM STAIN     Status: Normal   Collection Time   05/22/11  9:20 AM      Component Value Range Status Comment   Specimen Description WOUND   Final    Special Requests     Final    Value: SUBTHALAMIC STIMULATOR REMOVAL INFECTED IPG PT ON VANCO   Gram Stain     Final    Value: NO ORGANISMS SEEN NO WBC SEEN NO YEAST OR FUNGAL ELEMENTS SEEN   Report Status 05/22/2011 FINAL   Final     Studies/Results: No results found.  Micro: Wound with coag neg Staph, MRSE  Assessment:  78 yo with pocket infection.   Plan: 1. Pocket infection - ID with MRSE -continue vancomycin through 3/27 -PICC

## 2011-05-27 NOTE — Telephone Encounter (Signed)
Received return PC from pt wife today.  One battery needed replacement, so pt still in hospital, anxious to go home.  Wife will contact us for PT when pt returns home. FYI

## 2011-05-27 NOTE — Progress Notes (Signed)
ANTIBIOTIC CONSULT NOTE - FOLLOW UP  Pharmacy Consult for Vancomycin Indication: Neurostimulator Pocket Infection  Allergies  Allergen Reactions  . Morphine   . Risperidone And Related    Admit Complaint: 78 yo admitted 3/14  s/p removal of infected subthalamic stimulator. Pharmacy consulted to dose vancomycin.  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  afeb, wbc wnl.   3/14 Vancomycin (per ID thru 3/27)---> Will shoot for higher trough; ID is following, wants to treat with vanc for 3 weeks;vanc trough=16.2 on 3/16.  Cultures:  3/14 Wound  MRSE  Cardiovascular: Lipidemia; (asa, midodrine, zocor)  Neurology: Advanced Parkison's with electrical brain stimulator now removed; paxil, pramipexole, sinemet dose increased from home, amantadine  PTA Medication Issues: All home medications ordered  Best Practices:  DVT Prophylaxis: scds  Goal of Therapy:  Vancomycin trough level 15-20 mcg/ml  Plan:  Follow up SCr, UOP, cultures, clinical course, vancomycin trough every 7 days and adjust as clinically indicated. Next trough 3/22 Unless otherwise clinically indicated.  Patient Measurements: Height: 5\' 9"  (175.3 cm) Weight: 209 lb (94.802 kg) IBW/kg (Calculated) : 70.7   Vital Signs: Temp: 97.4 F (36.3 C) (03/19 0945) Temp src: Oral (03/19 0945) BP: 104/67 mmHg (03/19 0945) Pulse Rate: 82  (03/19 0945) Intake/Output from previous day: 03/18 0701 - 03/19 0700 In: 360 [P.O.:360] Out: -  Intake/Output from this shift: Total I/O In: 118 [P.O.:118] Out: -   Labs:  Basename 05/26/11 0630  WBC 9.0  HGB 12.5*  PLT 183  LABCREA --  CREATININE 1.33   Estimated Creatinine Clearance: 51.2 ml/min (by C-G formula based on Cr of 1.33).  Basename 05/24/11 1737  VANCOTROUGH 16.2  VANCOPEAK --  VANCORANDOM --  GENTTROUGH --  GENTPEAK --  GENTRANDOM --  TOBRATROUGH --  TOBRAPEAK --  TOBRARND --    AMIKACINPEAK --  AMIKACINTROU --  AMIKACIN --     Microbiology: Recent Results (from the past 720 hour(s))  SURGICAL PCR SCREEN     Status: Normal   Collection Time   05/22/11  6:44 AM      Component Value Range Status Comment   MRSA, PCR NEGATIVE  NEGATIVE  Final    Staphylococcus aureus NEGATIVE  NEGATIVE  Final   ANAEROBIC CULTURE     Status: Normal   Collection Time   05/22/11  9:11 AM      Component Value Range Status Comment   Specimen Description WOUND   Final    Special Requests     Final    Value: ON SWAB INFECTION OF IPG DEVICE IN CHEST WALL SUBTHALAMIC STIMULATOR   Gram Stain     Final    Value: RARE WBC PRESENT, PREDOMINANTLY PMN     RARE GRAM POSITIVE COCCI IN CLUSTERS     Gram Stain Report Called to,Read Back By and Verified With: Gram Stain Report Called to,Read Back By and Verified With: D REES RN 1100 05/22/11 JKILG[place] Performed at [facility]   Culture NO ANAEROBES ISOLATED   Final    Report Status 05/27/2011 FINAL   Final   WOUND CULTURE     Status: Normal   Collection Time   05/22/11  9:11 AM      Component Value Range Status Comment   Specimen Description WOUND   Final    Special Requests     Final    Value: ON SWAB INFECTION OF IPG DEVICE IN CHEST WALL SUBTHALAMIC STIMULATOR   Gram Stain  Final    Value: RARE WBC PRESENT, PREDOMINANTLY PMN     RARE GRAM POSITIVE COCCI IN CLUSTERS     Ladell Pier REES RN 1100 05/22/11 JKILGORE Performed at [facility]   Culture     Final    Value: FEW STAPHYLOCOCCUS SPECIES (COAGULASE NEGATIVE)     Note: RIFAMPIN AND GENTAMICIN SHOULD NOT BE USED AS SINGLE DRUGS FOR TREATMENT OF STAPH INFECTIONS. Two isolates with different morphologies were identified as the same organism.The most resistant organism was reported.   Report Status 05/27/2011 FINAL   Final    Organism ID, Bacteria STAPHYLOCOCCUS SPECIES (COAGULASE NEGATIVE)   Final   GRAM STAIN     Status: Normal   Collection Time   05/22/11  9:11 AM       Component Value Range Status Comment   Specimen Description WOUND   Final    Special Requests     Final    Value: ON SWAB INFECTION OF IPG DEVICE IN CHEST WALL SUBTHALAMIC STIMULATOR   Gram Stain     Final    Value: WOUND GRAM POSITIVE COCCI IN CLUSTERS WBC PRESENT, PREDOMINANTLY PMN     CALLED TO D. REES, RN 1100 03.14.13 JKILGORE   Report Status 05/22/2011 FINAL   Final   ANAEROBIC CULTURE     Status: Normal   Collection Time   05/22/11  9:20 AM      Component Value Range Status Comment   Specimen Description WOUND   Final    Special Requests     Final    Value: SUBTHALAMIC STIMULATOR REMOVAL INFECTED IPG PT ON VANCO   Gram Stain     Final    Value: NO WBC SEEN     NO ORGANISMS SEEN     Performed at [facility]   Culture NO ANAEROBES ISOLATED   Final    Report Status 05/27/2011 FINAL   Final   WOUND CULTURE     Status: Normal   Collection Time   05/22/11  9:20 AM      Component Value Range Status Comment   Specimen Description WOUND   Final    Special Requests     Final    Value: SUBTHALAMIC STIMULATOR REMOVAL INFECTED IPG PT ON VANCO   Gram Stain     Final    Value: NO WBC SEEN     NO ORGANISMS SEEN     Performed at [facility]   Culture NO GROWTH 2 DAYS   Final    Report Status 05/24/2011 FINAL   Final   GRAM STAIN     Status: Normal   Collection Time   05/22/11  9:20 AM      Component Value Range Status Comment   Specimen Description WOUND   Final    Special Requests     Final    Value: SUBTHALAMIC STIMULATOR REMOVAL INFECTED IPG PT ON VANCO   Gram Stain     Final    Value: NO ORGANISMS SEEN NO WBC SEEN NO YEAST OR FUNGAL ELEMENTS SEEN   Report Status 05/22/2011 FINAL   Final     Anti-infectives     Start     Dose/Rate Route Frequency Ordered Stop   05/22/11 1800   vancomycin (VANCOCIN) 750 mg in sodium chloride 0.9 % 150 mL IVPB        750 mg 150 mL/hr over 60 Minutes Intravenous Every 12 hours 05/22/11 1448     05/22/11 1530  ceFAZolin (ANCEF)  IVPB 1 g/50 mL premix        1 g 100 mL/hr over 30 Minutes Intravenous Every 8 hours 05/22/11 1522 05/22/11 2321   05/22/11 1000   vancomycin (VANCOCIN) IVPB 1000 mg/200 mL premix  Status:  Discontinued        1,000 mg 200 mL/hr over 60 Minutes Intravenous Every 12 hours 05/22/11 0952 05/22/11 1431   05/22/11 0824   bacitracin 50,000 Units in sodium chloride irrigation 0.9 % 500 mL irrigation  Status:  Discontinued          As needed 05/22/11 0840 05/22/11 0933   05/22/11 0740   bacitracin 16109 UNITS injection     Comments: FREEZE, PATRICIA: cabinet override         05/22/11 0740 05/22/11 1944   05/22/11 0734   vancomycin (VANCOCIN) 1 GM/200ML IVPB     Comments: Leona Singleton: cabinet override         05/22/11 0734 05/22/11 0741   05/22/11 0000   ceFAZolin (ANCEF) IVPB 2 g/50 mL premix        2 g 100 mL/hr over 30 Minutes Intravenous 60 min pre-op 05/21/11 1621 05/22/11 0752         [name] 05/27/2011,11:26 AM

## 2011-05-27 NOTE — Progress Notes (Signed)
ANTIBIOTIC CONSULT NOTE - FOLLOW UP  Pharmacy Consult for daptomycin Indication: pocket infection from neurostimulator   Allergies  Allergen Reactions  . Morphine   . Risperidone And Related   . Vancomycin Rash    Patient Measurements: Height: 5\' 9"  (175.3 cm) Weight: 209 lb (94.802 kg) IBW/kg (Calculated) : 70.7    Vital Signs: Temp: 97.7 F (36.5 C) (03/19 1350) Temp src: Oral (03/19 1350) BP: 112/71 mmHg (03/19 1350) Pulse Rate: 82  (03/19 1350)  Labs:  Basename 05/26/11 0630  WBC 9.0  HGB 12.5*  PLT 183  LABCREA --  CREATININE 1.33   Estimated Creatinine Clearance: 51.2 ml/min (by C-G formula based on Cr of 1.33).  Basename 05/24/11 1737  VANCOTROUGH 16.2  VANCOPEAK --  VANCORANDOM --  GENTTROUGH --  GENTPEAK --  GENTRANDOM --  TOBRATROUGH --  TOBRAPEAK --  TOBRARND --  AMIKACINPEAK --  AMIKACINTROU --  AMIKACIN --      Assessment: 78 yo who is s/p removal of infected subthalamic stimulator and noted with GPC/clusters in wound cultures. Change to daptomycin due to rash associated with vancomycin.  Goal of Therapy:  Appropriate dosing  Plan:  -Daptomycin 570 mg iv Q 24 hours (6 mg/kg) -Follow CK levels weekly  Elwin Sleight 05/27/2011,2:49 PM

## 2011-05-27 NOTE — Progress Notes (Signed)
ID to assess rash

## 2011-05-27 NOTE — Progress Notes (Signed)
CSW met with pt to provide bed offers. CSW will follow up with wife regarding bed offers. CSW will continue to follow to facilitate discharge to SNF.   Dede Query, MSW, Theresia Majors [phone]

## 2011-05-27 NOTE — Progress Notes (Signed)
Subjective: Patient reports "I just can't get comfortable."  Objective: Vital signs in last 24 hours: Temp:  [97.4 F (36.3 C)-97.9 F (36.6 C)] 97.4 F (36.3 C) (03/19 0945) Pulse Rate:  [71-88] 82  (03/19 0945) Resp:  [16-19] 18  (03/19 0945) BP: (95-164)/(58-91) 104/67 mmHg (03/19 0945) SpO2:  [93 %-98 %] 97 % (03/19 0945)  Intake/Output from previous day: 03/18 0701 - 03/19 0700 In: 360 [P.O.:360] Out: -  Intake/Output this shift: Total I/O In: 118 [P.O.:118] Out: -   Alert, conversant; wife & daughter present. Incisions without erythema, swelling, or drainage. Cx: staph, Vancomycin continues; PICC planned. New (?) rash back of both upper legs, entire back. no drainage. Some reported itching, eased by po Benadryl. Continuing to ambulate with PT.   Lab Results:  Basename 05/26/11 0630  WBC 9.0  HGB 12.5*  HCT 37.5*  PLT 183   BMET  Basename 05/26/11 0630  NA 135  K 4.9  CL 102  CO2 22  GLUCOSE 95  BUN 24*  CREATININE 1.33  CALCIUM 9.4    Studies/Results: No results found.  Assessment/Plan: Parkinson's stable; incisions healing; IVAB continue.  LOS: 5 days  IVAB continues, PICC planned. SNF search in progress.   Georgiann Cocker 05/27/2011, 12:20 PM

## 2011-05-27 NOTE — Progress Notes (Signed)
Came to see patient regarding rash.  Rash noted on face previously but also has rash on back and back of legs.  + confluent, erythematous, blanching.  Not typical of a drug rash with appearance and only on back, but possible.  I will d/c the vancomycin and use Daptomycin.  I will avoid linezolid since he is on an SSRI (paroxitine).  Call vancomycin an allergy. Needs one more week.  Will monitor.

## 2011-05-27 NOTE — Progress Notes (Signed)
Utilization review completed. Sammi Stolarz, RN, BSN. 05/27/11  

## 2011-05-28 MED ORDER — RIFAMPIN 300 MG PO CAPS
300.0000 mg | ORAL_CAPSULE | Freq: Two times a day (BID) | ORAL | Status: DC
  Administered 2011-05-28 – 2011-05-30 (×5): 300 mg via ORAL
  Filled 2011-05-28 (×6): qty 1

## 2011-05-28 MED ORDER — ROSUVASTATIN CALCIUM 10 MG PO TABS
10.0000 mg | ORAL_TABLET | Freq: Every day | ORAL | Status: DC
  Administered 2011-05-28: 10 mg via ORAL
  Filled 2011-05-28 (×3): qty 1

## 2011-05-28 NOTE — Progress Notes (Signed)
Occupational Therapy Treatment Patient Details Name: [name] MRN: [id number] DOB: 1929-10-05 Today's Date: 05/28/2011  OT Assessment/Plan OT Assessment/Plan Comments on Treatment Session: Pt progressing with therapy this session and demonstrating static standing at sink level. Pt with activiity tolerance deficits and balance deficits affecting all ADLs. Pt's family requesting w/c to push patient around the unit. RN Fulton Mole) made aware of this request. Pt's family educated that MD order required to leave the unit and that mobility can only occurr within 3000 unit if RN (alice) approves family assisting patient. Pt is fall risk and should be transfered by staff to and from w/c.  Equipment Recommended: None recommended by PT OT Goals ADL Goals Pt Will Perform Grooming: with supervision;Standing at sink;Unsupported ADL Goal: Grooming - Progress: Progressing toward goals Pt Will Transfer to Toilet: with supervision;3-in-1 ADL Goal: Toilet Transfer - Progress: Progressing toward goals  OT Treatment Precautions/Restrictions  Precautions Precautions: Fall Restrictions Weight Bearing Restrictions: No   ADL ADL Grooming: Performed;Teeth care;Shaving;Moderate assistance Grooming Details (indicate cue type and reason): pt opened containers standing at sink and brushing teeth. Pt provided shaving cream and verbal instructions to apply cream to face. Pt required (A) to squeeze cream into palm due to difficulty with fine motor. Pt however able to unscrew top and reapply cap to tooth paste. Pt applied cream to face. OT shaving face while patient maintained static standing with BIL UE propped on sink level  min guard (A) for ~10 minutes. Pt applying lotion to face after shaving. Where Assessed - Grooming: Standing at sink Lower Body Dressing: Performed;+1 Total assistance Lower Body Dressing Details (indicate cue type and reason): Pt with Rt foot amputation of toes and provided shoes for ambulation.  (A) provided PTA Where Assessed - Lower Body Dressing: Sitting, chair;Supported Equipment Used: Agricultural consultant ADL Comments: Pt performing grooming at sink level with static standing >10 minutes total. Pt repositioned in recliner at end of session. pT provided ROM exercises for neck while sitting. pt with limited ROM of neck laterally  and posteriorly. Mobility  Transfers Transfers: Yes Sit to Stand: 3: Mod assist;With upper extremity assist;From elevated surface;From chair/3-in-1;With armrests Sit to Stand Details (indicate cue type and reason): pt required extended time to scoot to edge of the chair. Pt with delay initiating task. Pt using BIL UE on arm rest and strong posterior lean with standing. Pt required Mod (A) to correct lean. Pt weight bearing throughout session on heels and toes extended. Stand to Sit: 3: Mod assist;With upper extremity assist;To elevated surface;To chair/3-in-1;With armrests Stand to Sit Details: pt required (A) to control descend to the chair Exercises    End of Session OT - End of Session Equipment Utilized During Treatment: Gait belt Activity Tolerance: Patient tolerated treatment well Patient left: in chair;with call bell in reach Nurse Communication: Mobility status for transfers;Mobility status for ambulation General Behavior During Session: [employer] [facility] for tasks performed Cognition: [facility] for tasks performed  Carmine, Carrozza  05/28/2011, 12:12 PM Pager: [phone]

## 2011-05-28 NOTE — Progress Notes (Signed)
CSW met with pt and wife regarding bed offers and discussing discharge plan. Pt's wife inquired about [facility]. CSW contacted [facility], who shared that pt would have needs best met at a SNF. CSW informed pt and his wife regarding this.   Pt's wife visited facilities and contacted this CSW to share she would like [facility] at discharge. CSW will contact [facility] regarding pt's wife accepting bed offer.   CSW will continue to follow to facilitate discharge to SNF when medically ready.   Dede Query, MSW, [facility] [phone]

## 2011-05-28 NOTE — Progress Notes (Signed)
Physical Therapy Treatment Patient Details Name: [name] MRN: [id number] DOB: [birth year] Today's Date: 05/28/2011  PT Assessment/Plan  PT - Assessment/Plan Comments on Treatment Session: Patient pleasant this morning and willing to participate in therapy. Todays session focused on technique and safety with gait and sit><stand. Patient practced sit to stand x 5 from chair in room. Needed frequent verbal and tactile cues for technique but showed progression with last stand. Patient demonstrated shuffling and festinating gait pattern, able to correct momentarily with cueing. Pt. is very motivated to return to prior function. PT Plan: Discharge plan remains appropriate PT Frequency: Min 3X/week Follow Up Recommendations: Skilled nursing facility;Supervision/Assistance - 24 hour Equipment Recommended: None recommended by PT PT Goals  Acute Rehab PT Goals PT Goal Formulation: With patient Time For Goal Achievement: 2 weeks PT Goal: Sit to Stand - Progress: Not met PT Goal: Stand to Sit - Progress: Not met PT Goal: Ambulate - Progress: Not met  PT Treatment Precautions/Restrictions  Precautions Precautions: Fall Required Braces or Orthoses: No Restrictions Weight Bearing Restrictions: No Mobility (including Balance) Transfers Transfers: Yes Sit to Stand: up to +2 total (A) (pt=70%) progressing to Min (A) (pt=80%) Sit to Stand Details (indicate cue type and reason): paitent sit><stand to go for walk in hallway and afterwards practiced sit><stand from chair x5 for technique and strengthening. Required up to +2 total assist at times with patient performing 70% of task. Last stand was performed with +1 min A (pt=80%) .  (A) required to achieve standing, balance, anterior translation of trunk over BOS.  Cues for initiation, hand placement, foot placement, & technique.  Pt. needed frequent cues to shift weight forward upon standing, stated he can feel himself leaning back and "i don't have my  balace". Frequent verbal and manual cues for safety and technique.  Stand to Sit: 4: Min assist;1: +2 Total assist;Patient percentage (comment);With armrests;With upper extremity assist;To chair/3-in-1 (patient=90%) Stand to Sit Details: +2 total assist at times for safety with pt. performing 90% of task. Verbal and tactile cues for hand placement. Demonstrated poor control with descent. Ambulation/Gait Ambulation/Gait: Yes Ambulation/Gait Assistance Details (indicate cue type and reason): Patient demonstrated shuffling and festinating gait. Frequent cues to pick feet up and take bigger steps. Pt. also had difficulty staying inside RW at times, able to correct with cueing but did not maintain.  Ambulation Distance (Feet): 250 Feet Assistive device: Rolling walker Gait Pattern: Shuffle;Festinating;Trunk flexed;Decreased step length - right;Decreased step length - left  End of Session PT - End of Session Equipment Utilized During Treatment: Gait belt Activity Tolerance: Patient tolerated treatment well;Patient limited by fatigue Patient left: in chair;with call bell in reach General Behavior During Session: [facility] for tasks performed Cognition: [facility] for tasks performed  Ardyth Gal SPTA 05/28/2011, 10:57 AM    Verdell Face, PTA [phone] 05/28/2011

## 2011-05-28 NOTE — Plan of Care (Signed)
Problem: Phase I Progression Outcomes Goal: Other Phase I Outcomes/Goals Outcome: Progressing Demonstrated sink level grooming task this session and increased static standing tolerance

## 2011-05-28 NOTE — Progress Notes (Signed)
INFECTIOUS DISEASE PROGRESS NOTE    Date of Admission:  05/22/2011   Total days of antibiotics 7        Vancomycin 3/14-18                                                                                      Daptomycin day 1               Active Problems:  * No active hospital problems. *       . amantadine  100 mg Oral BID  . aspirin EC  81 mg Oral Daily  . B-complex with vitamin C  1 tablet Oral Daily  . carbidopa-levodopa  1 tablet Oral 6 X Daily  . DAPTOmycin (CUBICIN)  IV  570 mg Intravenous Q24H  . meloxicam  15 mg Oral Daily  . methocarbamol  500 mg Oral QID  . midodrine  5 mg Oral BID  . pantoprazole  40 mg Oral Q1200  . PARoxetine  20 mg Oral Daily  . polyethylene glycol  17 g Oral Daily  . pramipexole  0.5 mg Oral 6 X Daily  . rifampin  300 mg Oral Q12H  . rosuvastatin  10 mg Oral q1800  . saccharomyces boulardii  250 mg Oral Daily  . sodium chloride  3 mL Intravenous Q12H  . DISCONTD: simvastatin  40 mg Oral QHS  . DISCONTD: vancomycin  750 mg Intravenous Q12H    Subjective: Shaking due to the Parkinson's but otherwise no complaints, itching better  Objective: Temp:  [97.3 F (36.3 C)-98.1 F (36.7 C)] 97.4 F (36.3 C) (03/20 1025) Pulse Rate:  [65-92] 92  (03/20 1025) Resp:  [18-20] 18  (03/20 1025) BP: (92-154)/(61-89) 92/61 mmHg (03/20 1025) SpO2:  [96 %-99 %] 97 % (03/20 1025)  General: Awake, alert, nad Skin: some rash on face, pocket site covered, non tender; rash on back less erythematous, less diffuse Lungs: CTA B Cor: RRR   Lab Results Lab Results  Component Value Date   WBC 9.0 05/26/2011   HGB 12.5* 05/26/2011   HCT 37.5* 05/26/2011   MCV 93.1 05/26/2011   PLT 183 05/26/2011    Lab Results  Component Value Date   CREATININE 1.33 05/26/2011   BUN 24* 05/26/2011   NA 135 05/26/2011   K 4.9 05/26/2011   CL 102 05/26/2011   CO2 22 05/26/2011    Lab Results  Component Value Date   ALT 7 05/21/2011   AST 40* 05/21/2011   ALKPHOS 88  05/21/2011   BILITOT 0.5 05/21/2011       Microbiology: Recent Results (from the past 240 hour(s))  SURGICAL PCR SCREEN     Status: Normal   Collection Time   05/22/11  6:44 AM      Component Value Range Status Comment   MRSA, PCR NEGATIVE  NEGATIVE  Final    Staphylococcus aureus NEGATIVE  NEGATIVE  Final   ANAEROBIC CULTURE     Status: Normal   Collection Time   05/22/11  9:11 AM      Component Value Range Status Comment   Specimen Description WOUND   Final  Special Requests     Final    Value: ON SWAB INFECTION OF IPG DEVICE IN CHEST WALL SUBTHALAMIC STIMULATOR   Gram Stain     Final    Value: RARE WBC PRESENT, PREDOMINANTLY PMN     RARE GRAM POSITIVE COCCI IN CLUSTERS     Gram Stain Report Called to,Read Back By and Verified With: Gram Stain Report Called to,Read Back By and Verified With: D REES RN 1100 05/22/11 JKILG[place] Performed at [facility]   Culture NO ANAEROBES ISOLATED   Final    Report Status 05/27/2011 FINAL   Final   WOUND CULTURE     Status: Normal   Collection Time   05/22/11  9:11 AM      Component Value Range Status Comment   Specimen Description WOUND   Final    Special Requests     Final    Value: ON SWAB INFECTION OF IPG DEVICE IN CHEST WALL SUBTHALAMIC STIMULATOR   Gram Stain     Final    Value: RARE WBC PRESENT, PREDOMINANTLY PMN     RARE GRAM POSITIVE COCCI IN CLUSTERS     Ladell Pier REES RN 1100 05/22/11 JKILGORE Performed at [facility]   Culture     Final    Value: FEW STAPHYLOCOCCUS SPECIES (COAGULASE NEGATIVE)     Note: RIFAMPIN AND GENTAMICIN SHOULD NOT BE USED AS SINGLE DRUGS FOR TREATMENT OF STAPH INFECTIONS. Two isolates with different morphologies were identified as the same organism.The most resistant organism was reported.   Report Status 05/27/2011 FINAL   Final    Organism ID, Bacteria STAPHYLOCOCCUS SPECIES (COAGULASE NEGATIVE)   Final   GRAM STAIN     Status: Normal   Collection Time   05/22/11  9:11 AM      Component Value  Range Status Comment   Specimen Description WOUND   Final    Special Requests     Final    Value: ON SWAB INFECTION OF IPG DEVICE IN CHEST WALL SUBTHALAMIC STIMULATOR   Gram Stain     Final    Value: WOUND GRAM POSITIVE COCCI IN CLUSTERS WBC PRESENT, PREDOMINANTLY PMN     CALLED TO D. REES, RN 1100 03.14.13 JKILGORE   Report Status 05/22/2011 FINAL   Final   ANAEROBIC CULTURE     Status: Normal   Collection Time   05/22/11  9:20 AM      Component Value Range Status Comment   Specimen Description WOUND   Final    Special Requests     Final    Value: SUBTHALAMIC STIMULATOR REMOVAL INFECTED IPG PT ON VANCO   Gram Stain     Final    Value: NO WBC SEEN     NO ORGANISMS SEEN     Performed at [facility]   Culture NO ANAEROBES ISOLATED   Final    Report Status 05/27/2011 FINAL   Final   WOUND CULTURE     Status: Normal   Collection Time   05/22/11  9:20 AM      Component Value Range Status Comment   Specimen Description WOUND   Final    Special Requests     Final    Value: SUBTHALAMIC STIMULATOR REMOVAL INFECTED IPG PT ON VANCO   Gram Stain     Final    Value: NO WBC SEEN     NO ORGANISMS SEEN     Performed at [facility]   Culture  NO GROWTH 2 DAYS   Final    Report Status 05/24/2011 FINAL   Final   GRAM STAIN     Status: Normal   Collection Time   05/22/11  9:20 AM      Component Value Range Status Comment   Specimen Description WOUND   Final    Special Requests     Final    Value: SUBTHALAMIC STIMULATOR REMOVAL INFECTED IPG PT ON VANCO   Gram Stain     Final    Value: NO ORGANISMS SEEN NO WBC SEEN NO YEAST OR FUNGAL ELEMENTS SEEN   Report Status 05/22/2011 FINAL   Final     Studies/Results: No results found.  Micro: Wound with coag neg Staph, MRSE  Assessment: 78 yo with pocket infection.   Plan: 1. Pocket infection - ID with MRSE -continue Daptomycin through 3/27, rifampin added as well

## 2011-05-28 NOTE — Progress Notes (Signed)
Rash appears to be improving.  Awaiting PICC

## 2011-05-28 NOTE — Progress Notes (Signed)
Subjective: Patient reports "I just get so shakey at times."  Objective: Vital signs in last 24 hours: Temp:  [97.3 F (36.3 C)-98.1 F (36.7 C)] 97.3 F (36.3 C) (03/20 0557) Pulse Rate:  [65-92] 69  (03/20 0557) Resp:  [18-20] 18  (03/20 0557) BP: (104-154)/(66-89) 118/77 mmHg (03/20 0557) SpO2:  [96 %-99 %] 98 % (03/20 0557)  Intake/Output from previous day: 03/19 0701 - 03/20 0700 In: 354 [P.O.:354] Out: -  Intake/Output this shift:    Alert, conversant, smiling often. Drsg dry. Incisions without erythema or swelling or drainage. No redness about face this am. Rash persists over back & posterior upper legs. Now on Daptomycin IV per ID.   Lab Results:  Basename 05/26/11 0630  WBC 9.0  HGB 12.5*  HCT 37.5*  PLT 183   BMET  Basename 05/26/11 0630  NA 135  K 4.9  CL 102  CO2 22  GLUCOSE 95  BUN 24*  CREATININE 1.33  CALCIUM 9.4    Studies/Results: No results found.  Assessment/Plan: improving  LOS: 6 days  Continuing IVAB per ID. SNF search underway.   Georgiann Cocker 05/28/2011, 8:06 AM

## 2011-05-29 LAB — CREATININE, SERUM: Creatinine, Ser: 1.44 mg/dL — ABNORMAL HIGH (ref 0.50–1.35)

## 2011-05-29 LAB — CK: Total CK: 90 U/L (ref 7–232)

## 2011-05-29 NOTE — Progress Notes (Signed)
Patient ID: [name], male   DOB: 1930/01/11, 78 y.o.   MRN: [id number]  Alert, in good spirits. Tremors persist BUE - reassured. Will follow from office over next 2 mos prn JX:BJYN Brain Stimulator. Updated plan: Will continue IVAB x102month via PICC. OK to d/c to SNF tomorrow. Office f/u in 1 month & prn with plan to replace IPG in 2-3 mos.  Georgiann Cocker RN, BSN

## 2011-05-29 NOTE — Progress Notes (Signed)
INFECTIOUS DISEASE PROGRESS NOTE    Date of Admission:  05/22/2011   Total days of antibiotics 8        Vancomycin 3/14-18                                                                                      Daptomycin day 2               Active Problems:  * No active hospital problems. *       . amantadine  100 mg Oral BID  . aspirin EC  81 mg Oral Daily  . B-complex with vitamin C  1 tablet Oral Daily  . carbidopa-levodopa  1 tablet Oral 6 X Daily  . DAPTOmycin (CUBICIN)  IV  570 mg Intravenous Q24H  . meloxicam  15 mg Oral Daily  . methocarbamol  500 mg Oral QID  . midodrine  5 mg Oral BID  . pantoprazole  40 mg Oral Q1200  . PARoxetine  20 mg Oral Daily  . polyethylene glycol  17 g Oral Daily  . pramipexole  0.5 mg Oral 6 X Daily  . rifampin  300 mg Oral Q12H  . rosuvastatin  10 mg Oral q1800  . saccharomyces boulardii  250 mg Oral Daily  . sodium chloride  3 mL Intravenous Q12H    Subjective: Shaking due to the Parkinson's but otherwise no complaints, itching better  Objective: Temp:  [97.3 F (36.3 C)-97.8 F (36.6 C)] 97.4 F (36.3 C) (03/21 1140) Pulse Rate:  [60-101] 65  (03/21 1140) Resp:  [20] 20  (03/21 1140) BP: (102-175)/(66-90) 124/80 mmHg (03/21 1140) SpO2:  [94 %-99 %] 99 % (03/21 1140)  General: Awake, alert, nad Skin: some rash on face, pocket site covered, non tender; rash on back less erythematous, less diffuse Lungs: CTA B Cor: RRR   Lab Results Lab Results  Component Value Date   WBC 9.0 05/26/2011   HGB 12.5* 05/26/2011   HCT 37.5* 05/26/2011   MCV 93.1 05/26/2011   PLT 183 05/26/2011    Lab Results  Component Value Date   CREATININE 1.44* 05/29/2011   BUN 24* 05/26/2011   NA 135 05/26/2011   K 4.9 05/26/2011   CL 102 05/26/2011   CO2 22 05/26/2011    Lab Results  Component Value Date   ALT 7 05/21/2011   AST 40* 05/21/2011   ALKPHOS 88 05/21/2011   BILITOT 0.5 05/21/2011       Microbiology: Recent Results (from the past 240  hour(s))  SURGICAL PCR SCREEN     Status: Normal   Collection Time   05/22/11  6:44 AM      Component Value Range Status Comment   MRSA, PCR NEGATIVE  NEGATIVE  Final    Staphylococcus aureus NEGATIVE  NEGATIVE  Final   ANAEROBIC CULTURE     Status: Normal   Collection Time   05/22/11  9:11 AM      Component Value Range Status Comment   Specimen Description WOUND   Final    Special Requests     Final    Value: ON SWAB INFECTION OF IPG DEVICE  IN CHEST WALL SUBTHALAMIC STIMULATOR   Gram Stain     Final    Value: RARE WBC PRESENT, PREDOMINANTLY PMN     RARE GRAM POSITIVE COCCI IN CLUSTERS     Gram Stain Report Called to,Read Back By and Verified With: Gram Stain Report Called to,Read Back By and Verified With: D REES RN 1100 05/22/11 J[place] Performed at [facility]   Culture NO ANAEROBES ISOLATED   Final    Report Status 05/27/2011 FINAL   Final   WOUND CULTURE     Status: Normal   Collection Time   05/22/11  9:11 AM      Component Value Range Status Comment   Specimen Description WOUND   Final    Special Requests     Final    Value: ON SWAB INFECTION OF IPG DEVICE IN CHEST WALL SUBTHALAMIC STIMULATOR   Gram Stain     Final    Value: RARE WBC PRESENT, PREDOMINANTLY PMN     RARE GRAM POSITIVE COCCI IN CLUSTERS     Ladell Pier REES RN 1100 05/22/11 JKILGORE Performed at [facility]   Culture     Final    Value: FEW STAPHYLOCOCCUS SPECIES (COAGULASE NEGATIVE)     Note: RIFAMPIN AND GENTAMICIN SHOULD NOT BE USED AS SINGLE DRUGS FOR TREATMENT OF STAPH INFECTIONS. Two isolates with different morphologies were identified as the same organism.The most resistant organism was reported.   Report Status 05/27/2011 FINAL   Final    Organism ID, Bacteria STAPHYLOCOCCUS SPECIES (COAGULASE NEGATIVE)   Final   GRAM STAIN     Status: Normal   Collection Time   05/22/11  9:11 AM      Component Value Range Status Comment   Specimen Description WOUND   Final    Special Requests     Final     Value: ON SWAB INFECTION OF IPG DEVICE IN CHEST WALL SUBTHALAMIC STIMULATOR   Gram Stain     Final    Value: WOUND GRAM POSITIVE COCCI IN CLUSTERS WBC PRESENT, PREDOMINANTLY PMN     CALLED TO D. REES, RN 1100 03.14.13 JKILGORE   Report Status 05/22/2011 FINAL   Final   ANAEROBIC CULTURE     Status: Normal   Collection Time   05/22/11  9:20 AM      Component Value Range Status Comment   Specimen Description WOUND   Final    Special Requests     Final    Value: SUBTHALAMIC STIMULATOR REMOVAL INFECTED IPG PT ON VANCO   Gram Stain     Final    Value: NO WBC SEEN     NO ORGANISMS SEEN     Performed at [facility]   Culture NO ANAEROBES ISOLATED   Final    Report Status 05/27/2011 FINAL   Final   WOUND CULTURE     Status: Normal   Collection Time   05/22/11  9:20 AM      Component Value Range Status Comment   Specimen Description WOUND   Final    Special Requests     Final    Value: SUBTHALAMIC STIMULATOR REMOVAL INFECTED IPG PT ON VANCO   Gram Stain     Final    Value: NO WBC SEEN     NO ORGANISMS SEEN     Performed at [facility]   Culture NO GROWTH 2 DAYS   Final    Report Status 05/24/2011 FINAL   Final  GRAM STAIN     Status: Normal   Collection Time   05/22/11  9:20 AM      Component Value Range Status Comment   Specimen Description WOUND   Final    Special Requests     Final    Value: SUBTHALAMIC STIMULATOR REMOVAL INFECTED IPG PT ON VANCO   Gram Stain     Final    Value: NO ORGANISMS SEEN NO WBC SEEN NO YEAST OR FUNGAL ELEMENTS SEEN   Report Status 05/22/2011 FINAL   Final     Studies/Results: No results found.  Micro: Wound with coag neg Staph, MRSE  Assessment: 78 yo with pocket infection. After discussing with Dr. Venetia Maxon, he tells me there was some erosion in the pocket so I will extend therapy to 1 month with IV daptomycin + rifampin.    Plan: 1. Pocket infection - ID with MRSE -continue Daptomycin through 4/10, rifampin as well -PICC  -I ALSO  WOULD HOLD THE STATIN WHILE ON DAPTOMYCIN for that duration  To avoid CPK elevations/rhabdo. I have discontinued here.  -At discharge, he will need a weekly CPK total, CBC, CMP, CRP and ESR.    He can follow up with the ID clinic around 4/10 at the end of therapy to assure no further discharge from the pocket or issues.  We will arrange  Thanks

## 2011-05-29 NOTE — Progress Notes (Signed)
CSW spoke with [facility] regarding anticipated discharge date for tomorrow to SNF. CSW informed pt and pt's wife of this update. Pt's wife will contact [facility] in the morning to complete admission paperwork. CSW will continue to follow to facilitate discharge to SNF on 05/30/11.   Dede Query, MSW, Theresia Majors [phone]

## 2011-05-29 NOTE — Progress Notes (Signed)
Subjective: Patient reports "I feel ok."  Objective: Vital signs in last 24 hours: Temp:  [97.3 F (36.3 C)-97.8 F (36.6 C)] 97.8 F (36.6 C) (03/21 0554) Pulse Rate:  [60-101] 60  (03/21 0554) Resp:  [18-20] 20  (03/21 0554) BP: (92-175)/(61-90) 125/73 mmHg (03/21 0554) SpO2:  [94 %-98 %] 97 % (03/21 0554)  Intake/Output from previous day: 03/20 0701 - 03/21 0700 In: 118 [P.O.:118] Out: -  Intake/Output this shift:    Alert, conversant. Rash improving. Itching less per pt. Incisions without erythema or drainage. Staples intact. Chest pocket site (away from incision, at base of pocket) with lceration and blood on dressing. No surrounding swelling or erythema. No drainage can be expressed with gentle pressure.   Lab Results: No results found for this basename: WBC:2,HGB:2,HCT:2,PLT:2 in the last 72 hours BMET  [facility] 05/29/11 0527  NA --  K --  CL --  CO2 --  GLUCOSE --  BUN --  CREATININE 1.44*  CALCIUM --    Studies/Results: No results found.  Assessment/Plan: Improving  LOS: 7 days  Rifampin & Daptomycin continue per ID.    Georgiann Cocker 05/29/2011, 8:45 AM

## 2011-05-30 MED ORDER — SODIUM CHLORIDE 0.9 % IJ SOLN: 10.0000 mL | INTRAMUSCULAR | Status: DC | PRN

## 2011-05-30 NOTE — Progress Notes (Signed)
ANTIBIOTIC CONSULT NOTE - FOLLOW UP  Pharmacy Consult for daptomycin D4 - to continue thru 4/10 as outpatient Indication: pocket infection from neurostimulator   Allergies  Allergen Reactions  . Morphine   . Risperidone And Related   . Vancomycin Rash    Patient Measurements: Height: 5\' 9"  (175.3 cm) Weight: 209 lb (94.802 kg) IBW/kg (Calculated) : 70.7    Vital Signs: Temp: 97.5 F (36.4 C) (03/22 1410) Temp src: Oral (03/22 1410) BP: 127/66 mmHg (03/22 1410) Pulse Rate: 66  (03/22 1410)  Labs:  Basename 05/29/11 0527  WBC --  HGB --  PLT --  LABCREA --  CREATININE 1.44*   Estimated Creatinine Clearance: 47.2 ml/min (by C-G formula based on Cr of 1.44). No results found for this basename: VANCOTROUGH:2,VANCOPEAK:2,VANCORANDOM:2,GENTTROUGH:2,GENTPEAK:2,GENTRANDOM:2,TOBRATROUGH:2,TOBRAPEAK:2,TOBRARND:2,AMIKACINPEAK:2,AMIKACINTROU:2,AMIKACIN:2, in the last 72 hours    Assessment: 78 yo who is s/p removal of infected subthalamic stimulator and noted with GPC/clusters in wound cultures. Change to daptomycin due to rash associated with vancomycin.  Wound Cx with coag-negative staph.  CK = 90 (WNL) on 3/21  Goal of Therapy:  Appropriate dosing  Plan:  -Daptomycin 570 mg iv Q 24 hours (6 mg/kg) -Follow CK levels weekly  [name] C 05/30/2011,2:14 PM

## 2011-05-30 NOTE — Progress Notes (Signed)
Occupational Therapy Treatment Patient Details Name: [name] MRN: [id number] DOB: 06-23-1929 Today's Date: 05/30/2011  OT Assessment/Plan OT Assessment/Plan Comments on Treatment Session: Pt progressing with therapy but limited today by need urgent need to void.  Continues to demonstrate balance deficits. OT Plan: Discharge plan remains appropriate OT Frequency: Min 2X/week Follow Up Recommendations: Home health OT OT Goals ADL Goals Pt Will Transfer to Toilet: with supervision;3-in-1 ADL Goal: Toilet Transfer - Progress: Progressing toward goals Pt Will Perform Toileting - Clothing Manipulation: with supervision;Sitting on 3-in-1 or toilet ADL Goal: Toileting - Clothing Manipulation - Progress: Progressing toward goals Miscellaneous OT Goals Miscellaneous OT Goal #1: Pt will complete bed mobility at supervision level as precursor to ADLS OT Goal: Miscellaneous Goal #1 - Progress: Progressing toward goals  OT Treatment Precautions/Restrictions  Precautions Precautions: Fall Restrictions Weight Bearing Restrictions: No   ADL ADL Toilet Transfer: Performed;+2 Total assistance;Comment for patient % (70%) Toilet Transfer Details (indicate cue type and reason): +2 assist for safety. Pt requiring increased time and manual cues to RLE to bring foot forward.  Toilet Transfer Method: Surveyor, minerals: Programme researcher, broadcasting/film/video Manipulation: Performed;+1 Total assistance Toileting - Clothing Manipulation Details (indicate cue type and reason): +1 assist to pull undergarment down over hips Where Assessed - Toileting Clothing Manipulation: Standing Equipment Used: Rolling walker Mobility  Bed Mobility Bed Mobility: Yes Sitting - Scoot to [place] of Bed: 4: Min assist Sitting - Scoot to [place] of Bed Details (indicate cue type and reason): assist for weight shift for efficient scooting. Transfers Transfers: Yes Sit to Stand: 1: +2 Total assist;From  bed;With upper extremity assist;Patient percentage (comment) (80) Sit to Stand Details (indicate cue type and reason): VC for hand placement. Assist for anterior translation secondary to posterior lean. Stand to Sit: 4: Min assist;To chair/3-in-1;With upper extremity assist;With armrests Stand to Sit Details: cueing for safe hand placement.  demonstrated good control of descent. Exercises    End of Session OT - End of Session Equipment Utilized During Treatment: Gait belt Activity Tolerance: Other (comment) (Treatment limited by pt's urgency to void) Patient left: Other (comment) (on 3n1 with RN) Nurse Communication: Mobility status for transfers General Behavior During Session: [facility] for tasks performed Cognition: [facility] for tasks performed   10:32 AM 05/30/2011 Cipriano Mile OTR/L Pager [phone] Office [phone]

## 2011-05-30 NOTE — Progress Notes (Signed)
Pt. D/c instructions provided. Signed by wife. Pt i/v d/c by RN. Pt Picc locked by IV team.  Pt under no s/s distress. Wound wiped with sterile saline and covered.

## 2011-05-30 NOTE — Progress Notes (Signed)
Clinical Social Worker facilitated patient discharge including contacting facility and patient wife to confirm discharge plans.  CSW arranged ambulance transport to [facility] for 15:00.  Patient and patient wife anxiously awaiting discharge.  Clinical Social Worker will sign off for now as social work intervention is no longer needed. Please consult [place] again if new need arises.  [address] [place], [place] [phone]

## 2011-05-30 NOTE — Progress Notes (Signed)
PT Cancellation Note  Treatment cancelled today due to patient currently in the process of being transported to SNF this afternoon. Defer further PT treatment to next venue of care.  Romeo Rabon 05/30/2011, 3:26 PM

## 2011-05-30 NOTE — Discharge Summary (Signed)
Physician Discharge Summary  Patient ID: [name] MRN: [id number] DOB/AGE: 76-May-1931 76 y.o.  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 *    Discharged Condition: good  Hospital Course: Admission for removal of infected left chest implanted pulse generator for Parkinsons. Surgery was completed without complication and Mr. Kohrs was transferred to 3000 for Infectious Disease follow-up and IVAB. A rash developed over his back and legs after four days of Vancomycin. This continues to improve since changing to Daptomycin.  He is d/c'ed to SNF to continue IVAB and weekly labs per ID.   Consults: ID  Significant Diagnostic Studies: Staph pocket infection responsive to Daptomycin.   Treatments: surgery: SUBTHALAMIC STIMULATOR Implantable pulse generatorREMOVAL along with electrode extension   Discharge Exam: Blood pressure 153/92, pulse 90, temperature 97.3 F (36.3 C), temperature source Oral, resp. rate 18, height 5\' 9"  (1.753 m), weight 94.802 kg (209 lb), SpO2 96.00%. Alert, in good spirits. Tremors persist BUE - reassured. Will follow from office over next 2 mos prn JY:NWGN Brain Stimulator.   Per FA:OZHY continue IVAB x65month via PICC. OK to d/c to SNF. Office f/u in 1 week for staple removal & prn with plan to replace IPG in 2-3 mo. Per ID: holding Statin while on Daptomycin; weekly CPK total, CBC, CMP, CRP and ESR. Follow up with the ID clinic around 4/10 at the end of therapy to assure no further discharge from the pocket or issues.     Disposition: D/C to SNF  Discharge Orders    Future Appointments: Provider: Department: Dept Phone: Center:   06/16/2011 3:30 PM  Gardiner Barefoot, MD [facility] [phone] [facility]     Future Orders Please Complete By Expires   Change dressing      Comments:   Daily dressing changes to chest wound     Medication List  As of 05/30/2011 10:37 AM   STOP taking these medications         simvastatin 40 MG tablet         TAKE these medications         amantadine 100 MG capsule   Commonly known as: SYMMETREL   Take 100 mg by mouth 2 (two) times daily.      aspirin 81 MG tablet   Take 81 mg by mouth daily.      B COMPLEX PO   Take 1 tablet by mouth 2 (two) times daily.      carbidopa-levodopa 25-250 MG per tablet   Commonly known as: SINEMET   Take 0.5 tablets by mouth 6 (six) times daily.      fluticasone 50 MCG/ACT nasal spray   Commonly known as: FLONASE   Place 1 spray into the nose daily as needed. For dry nasal passages      meloxicam 15 MG tablet   Commonly known as: MOBIC   Take 15 mg by mouth daily.      methocarbamol 500 MG tablet   Commonly known as: ROBAXIN   Take 500 mg by mouth 4 (four) times daily. For muscle spasms      midodrine 5 MG tablet   Commonly known as: PROAMATINE   Take 5 mg by mouth 2 (two) times daily.  omeprazole 40 MG capsule   Commonly known as: PRILOSEC   Take 1 capsule (40 mg total) by mouth daily.      PARoxetine 20 MG tablet   Commonly known as: PAXIL   Take 1 tablet (20 mg total) by mouth every morning.      polyethylene glycol packet   Commonly known as: MIRALAX / GLYCOLAX   Take 17 g by mouth daily as needed. For constipation      pramipexole 1 MG tablet   Commonly known as: MIRAPEX   Take 0.5 mg by mouth 6 (six) times daily.      PROBIOTIC FORMULA Caps   Take 1 capsule by mouth daily.             Signed: Georgiann Cocker 05/30/2011, 10:37 AM

## 2011-06-03 ENCOUNTER — Encounter (HOSPITAL_COMMUNITY): Payer: Self-pay | Admitting: *Deleted

## 2011-06-03 ENCOUNTER — Emergency Department (HOSPITAL_COMMUNITY)
Admission: EM | Admit: 2011-06-03 | Discharge: 2011-06-03 | Disposition: A | Discharge status: Rehabilitation Facility | Payer: Medicare Other | Attending: Emergency Medicine | Admitting: Emergency Medicine

## 2011-06-03 DIAGNOSIS — G20 Parkinson's disease: Secondary | ICD-10-CM | POA: Insufficient documentation

## 2011-06-03 DIAGNOSIS — Z139 Encounter for screening, unspecified: Secondary | ICD-10-CM | POA: Insufficient documentation

## 2011-06-03 DIAGNOSIS — Z79899 Other long term (current) drug therapy: Secondary | ICD-10-CM | POA: Insufficient documentation

## 2011-06-03 DIAGNOSIS — E785 Hyperlipidemia, unspecified: Secondary | ICD-10-CM | POA: Insufficient documentation

## 2011-06-03 DIAGNOSIS — K219 Gastro-esophageal reflux disease without esophagitis: Secondary | ICD-10-CM | POA: Insufficient documentation

## 2011-06-03 DIAGNOSIS — G20A1 Parkinson's disease without dyskinesia, without mention of fluctuations: Secondary | ICD-10-CM | POA: Insufficient documentation

## 2011-06-03 DIAGNOSIS — I2510 Atherosclerotic heart disease of native coronary artery without angina pectoris: Secondary | ICD-10-CM | POA: Insufficient documentation

## 2011-06-03 MED ORDER — MIDODRINE HCL 5 MG PO TABS
5.0000 mg | ORAL_TABLET | Freq: Two times a day (BID) | ORAL | Status: DC
  Administered 2011-06-03: 5 mg via ORAL
  Filled 2011-06-03 (×3): qty 1

## 2011-06-03 MED ORDER — HYDROCODONE-ACETAMINOPHEN 5-325 MG PO TABS
1.0000 | ORAL_TABLET | Freq: Once | ORAL | Status: AC
  Administered 2011-06-03: 1 via ORAL
  Filled 2011-06-03: qty 1

## 2011-06-03 MED ORDER — SODIUM CHLORIDE 0.9 % IV SOLN
570.0000 mg | INTRAVENOUS | Status: DC
  Administered 2011-06-03: 570 mg via INTRAVENOUS
  Filled 2011-06-03 (×2): qty 11.4

## 2011-06-03 MED ORDER — CARBIDOPA-LEVODOPA 25-250 MG PO TABS
0.5000 | ORAL_TABLET | Freq: Every day | ORAL | Status: DC
  Administered 2011-06-03 (×2): 0.5 via ORAL
  Filled 2011-06-03 (×9): qty 1

## 2011-06-03 MED ORDER — PRAMIPEXOLE DIHYDROCHLORIDE 0.25 MG PO TABS
0.5000 mg | ORAL_TABLET | Freq: Every day | ORAL | Status: DC
  Administered 2011-06-03 (×2): 0.5 mg via ORAL
  Filled 2011-06-03 (×9): qty 2

## 2011-06-03 MED ORDER — AMANTADINE HCL 100 MG PO CAPS
100.0000 mg | ORAL_CAPSULE | Freq: Two times a day (BID) | ORAL | Status: DC
  Administered 2011-06-03: 100 mg via ORAL
  Filled 2011-06-03 (×3): qty 1

## 2011-06-03 NOTE — ED Notes (Signed)
Report called to [facility]. Called [facility] to transfer pt to [place]. Waiting on [facility] to transfer patient.

## 2011-06-03 NOTE — ED Notes (Signed)
CSW met with pt and family at the request of the CSW Director, [employer]. Pt was recently discharged from [facility] due to issues in securing the pt's needed medications long term. At this time, pt has now been offered a bed at [facility] for which the pt and wife Vahe Pienta [phone]) have chosen to accept after speaking with this CSW. CSW has been in contact with Lidia Collum at [facility], who reported that the pt has a bed available this evening. Pt's wife has agreed to meet with Cordelia Pen at [facility] this evening to complete the necessary documentation for admission. Per EDP, pt will receive his medications while at [facility] and transfer to [facility] once this has been completed. CSW will complete pt's packet to send with pt to [facility]. CSW to follow pt and family to ensure pt placed at [facility] without further issues.

## 2011-06-03 NOTE — ED Notes (Signed)
Pt has a sterile dressing on left chest wall from where he had a battery removed at [facility]. Dressing is CDI. Wife reports dressing change performed on 06/03/2011

## 2011-06-03 NOTE — Discharge Instructions (Signed)
Return to emergency Department as needed.

## 2011-06-03 NOTE — ED Notes (Signed)
Pt had a device implanted in chest wall for Parkinson disease. Device became infected and was removed and released from [facility] Friday and went to Rehab on Friday. Received IV abx through picc line but medication no longer can be administered at the rehab facility.

## 2011-06-03 NOTE — ED Notes (Signed)
Pt has an umbilical hernia. Pt denies pain.

## 2011-06-03 NOTE — ED Notes (Signed)
Waiting on IV antibiotic from Pharmacy before discharging pt.

## 2011-06-03 NOTE — ED Notes (Signed)
Wife is requesting pain medicine for pt's back pain.

## 2011-06-03 NOTE — ED Provider Notes (Signed)
History     CSN: [id number]  Arrival date & time 06/03/11  1638   First MD Initiated Contact with Patient 06/03/11 1744      Chief Complaint  Patient presents with  . IV Medication     HPI Pt had a device implanted in chest wall for Parkinson disease. Device became infected and was removed and released from [facility] Friday and went to Rehab on Friday. Received IV abx through picc line but medication no longer can be administered at the rehab facility.  Past Medical History  Diagnosis Date  . HYPERLIPIDEMIA-MIXED 02/10/2008  . PARKINSON'S DISEASE 02/10/2008  . HYPERTENSION, BENIGN 02/09/2009  . GASTROINTESTINAL HEMORRHAGE, HX OF 02/10/2008  . ANEMIA 04/29/2010  . FIBRILLATION, ATRIAL 04/29/2010  . VENTRAL HERNIA 04/29/2010  . DEGENERATIVE JOINT DISEASE 04/29/2010  . Gastric ulcer   . Positive PPD   . Internal hemorrhoids   . Hyperplastic colon polyp 2004  . CAD (coronary artery disease)     23.0 x 24 mm Taxus DES mid LAD 2005, DCA diagonal branch 2005  . VENOUS INSUFFICIENCY 02/10/2008    rt  . Shortness of breath   . Osteomyelitis   . Calculus of kidney   . Tuberculosis     78 yrs old  . GERD (gastroesophageal reflux disease)     ulcers hx    Past Surgical History  Procedure Date  . Replacement total knee 2002    right  . Nose surgery   . Ankle surgery     left  . Toe amputation     right foot  . Cataract extraction   . Carpal tunnel release     left, open rt  . Tonsillectomy   . Appendectomy   . Coronary angioplasty with stent placement   . Back surgery 2006     back x3  . Deep brain stimulator placement 02    batteries changed 07  . Subthalamic stimulator battery replacement 02/27/2011    Procedure: SUBTHALAMIC STIMULATOR BATTERY REPLACEMENT;  Surgeon: Dorian Heckle, MD;  Location: [facility];  Service: Neurosurgery;  Laterality: Left;  DBS battery replacment x 2 - Left Chest and Abdomen  . Eye muscle surgery     bilat  . Subthalamic stimulator removal  05/22/2011    Procedure: SUBTHALAMIC STIMULATOR REMOVAL;  Surgeon: Maeola Harman, MD;  Location: [facility];  Service: Neurosurgery;  Laterality: N/A;  removal of IPG    Family History  Problem Relation Age of Onset  . Parkinsonism Sister   . Diabetes Brother   . Crohn's disease Daughter   . Heart disease Paternal Grandfather   . Colon cancer Neg Hx     History  Substance Use Topics  . Smoking status: Never Smoker   . Smokeless tobacco: Never Used  . Alcohol Use: No      Review of Systems  All other systems reviewed and are negative.    Allergies  Morphine; Risperidone and related; and Vancomycin  Home Medications   Current Outpatient Rx  Name Route Sig Dispense Refill  . AMANTADINE HCL 100 MG PO CAPS Oral Take 100 mg by mouth 2 (two) times daily.     . ASPIRIN EC 81 MG PO TBEC Oral Take 81 mg by mouth daily.    . B COMPLEX PO Oral Take 1 tablet by mouth 2 (two) times daily.     Marland Kitchen CARBIDOPA-LEVODOPA 25-250 MG PO TABS Oral Take 0.5 tablets by mouth 6 (six) times daily.     Marland Kitchen  FLUTICASONE PROPIONATE 50 MCG/ACT NA SUSP Nasal Place 1 spray into the nose daily as needed. For dry nasal passages    . MELOXICAM 15 MG PO TABS Oral Take 15 mg by mouth daily.     Marland Kitchen MIDODRINE HCL 5 MG PO TABS Oral Take 5 mg by mouth 2 (two) times daily.     Marland Kitchen OMEPRAZOLE 40 MG PO CPDR Oral Take 1 capsule (40 mg total) by mouth daily. 30 capsule 3  . PAROXETINE HCL 20 MG PO TABS Oral Take 1 tablet (20 mg total) by mouth every morning. 30 tablet 11  . POLYETHYLENE GLYCOL 3350 PO PACK Oral Take 17 g by mouth daily as needed. For constipation    . PRAMIPEXOLE DIHYDROCHLORIDE 1 MG PO TABS Oral Take 0.5 mg by mouth 6 (six) times daily.    Marland Kitchen PROBIOTIC FORMULA PO Oral Take 1 capsule by mouth daily.    Marland Kitchen DAPTOMYCIN IVPB Intravenous Inject 570 mg into the vein daily. Via PICC line; 30 day course of therapy started 05/31/2011      BP 173/100  Pulse 66  Temp(Src) 98.5 F (36.9 C) (Oral)  Resp 16  Ht 5\' 9"   (1.753 m)  Wt 209 lb (94.802 kg)  BMI 30.86 kg/m2  SpO2 97%  Physical Exam  Nursing note and vitals reviewed. Constitutional: He appears well-developed and well-nourished. No distress.  HENT:  Head: Normocephalic and atraumatic.  Eyes: Pupils are equal, round, and reactive to light.  Neck: Normal range of motion.  Cardiovascular: Normal rate and intact distal pulses.   Pulmonary/Chest: Effort normal and breath sounds normal. No respiratory distress.  Abdominal: Normal appearance. He exhibits no distension.  Musculoskeletal: Normal range of motion.  Neurological: He is alert. No cranial nerve deficit.  Skin: Skin is warm and dry. No rash noted.  Psychiatric: He has a normal mood and affect. His behavior is normal.    ED Course  Procedures (including critical care time) Scheduled Meds:   . amantadine  100 mg Oral BID  . carbidopa-levodopa  0.5 tablet Oral 6 X Daily  . DAPTOmycin (CUBICIN)  IV  570 mg Intravenous Q24H  . midodrine  5 mg Oral BID  . pramipexole  0.5 mg Oral 6 X Daily   Continuous Infusions:  PRN Meds:.  Labs Reviewed - No data to display No results found.   1. Medication management       MDM         Nelia Shi, MD 06/03/11 2008

## 2011-06-03 NOTE — Progress Notes (Signed)
LATE ENTRY  Clinical Social Work Department CLINICAL SOCIAL WORK PLACEMENT NOTE 06/03/2011  Patient:  [name], [name]  Account Number:  [id number] Admit date:  05/22/2011  Clinical Social Worker:  Peggyann Shoals  Date/time:  06/03/2011 02:00 PM  Clinical Social Work is seeking post-discharge placement for this patient at the following level of care:   SKILLED NURSING   (*CSW will update this form in Epic as items are completed)   05/26/2011  Patient/family provided with [facility] Department of Clinical Social Work's list of facilities offering this level of care within the geographic area requested by the patient (or if unable, by the patient's family).  05/26/2011  Patient/family informed of their freedom to choose among providers that offer the needed level of care, that participate in Medicare, Medicaid or managed care program needed by the patient, have an available bed and are willing to accept the patient.  05/26/2011  Patient/family informed of [facility]' ownership interest in [facility], as well as of the fact that they are under no obligation to receive care at this facility.  PASARR submitted to EDS on  PASARR number received from EDS on   FL2 transmitted to all facilities in geographic area requested by pt/family on  05/27/2011 FL2 transmitted to all facilities within larger geographic area on   Patient informed that his/her managed care company has contracts with or will negotiate with  certain facilities, including the following:     Patient/family informed of bed offers received:  05/27/2011 Patient chooses bed at [facility] Physician recommends and patient chooses bed at  [facility]  Patient to be transferred to [facility] on  05/30/2011 Patient to be transferred to facility by [facility]  The following physician request were entered in Epic:   Additional  Comments: Exisiting PASARR

## 2011-06-03 NOTE — ED Notes (Signed)
Still waiting on IV ABX to complete

## 2011-06-16 ENCOUNTER — Ambulatory Visit (INDEPENDENT_AMBULATORY_CARE_PROVIDER_SITE_OTHER): Payer: Medicare Other | Admitting: Internal Medicine

## 2011-06-16 ENCOUNTER — Encounter: Payer: Self-pay | Admitting: Internal Medicine

## 2011-06-16 VITALS — BP 112/72 | HR 95 | Temp 97.7°F | Ht 73.0 in | Wt 209.0 lb

## 2011-06-16 DIAGNOSIS — B9689 Other specified bacterial agents as the cause of diseases classified elsewhere: Secondary | ICD-10-CM | POA: Insufficient documentation

## 2011-06-16 DIAGNOSIS — A499 Bacterial infection, unspecified: Secondary | ICD-10-CM

## 2011-06-16 DIAGNOSIS — L089 Local infection of the skin and subcutaneous tissue, unspecified: Secondary | ICD-10-CM

## 2011-06-16 NOTE — Patient Instructions (Signed)
Call if needed

## 2011-06-16 NOTE — Assessment & Plan Note (Signed)
He is doing well and skin is intact, no erythema.  He will complete 1 month of IV antibiotics in a few days and at that time can stop the Daptomycin and rifampin.  No further indication for extension of the antibiotics.  He will return as needed and I will alert Dr. Venetia Maxon of the discontinuation of antibiotics.

## 2011-06-16 NOTE — Progress Notes (Signed)
  Subjective:    Patient ID: [name], male    DOB: 1929-03-28, 78 y.o.   MRN: [id number]  HPI Comes in for follow up on his pocket infection for neurostimulator battery.  Had some erythema and intraoperatively per Dr. Venetia Maxon had some erosion in the area and culture grew MRSE.  He was initially placed on vancomycin but unfortunately developed a rash and needed to be switched to daptomycin.  He also continued on rifampin.  He did have an issue with his facility and needed to be transferred to another.  He otherwise has had no fever, chills, erythema or other difficulty.     Review of Systems  Constitutional: Negative for fever, chills and fatigue.  Musculoskeletal: Positive for gait problem.       Parkinsons  Skin: Negative for pallor, rash and wound.  Psychiatric/Behavioral: Negative for dysphoric mood. The patient is not nervous/anxious.        Objective:   Physical Exam        Assessment & Plan:

## 2011-06-23 ENCOUNTER — Other Ambulatory Visit: Payer: Self-pay | Admitting: Neurosurgery

## 2011-06-27 ENCOUNTER — Encounter (HOSPITAL_COMMUNITY): Payer: Self-pay | Admitting: Pharmacy Technician

## 2011-07-04 ENCOUNTER — Encounter (HOSPITAL_COMMUNITY): Payer: Self-pay | Admitting: *Deleted

## 2011-07-04 NOTE — Pre-Procedure Instructions (Signed)
[address]  07/04/2011   Your procedure is scheduled on:  April 30  Report to [facility] at 0800 AM.  Call this number if you have problems the morning of surgery: [phone]   Remember:   Do not eat food:After Midnight.  May have clear liquids: up to 4 Hours before arrival. 0400 AM  Clear liquids include soda, tea, black coffee, apple or grape juice, broth.  Take these medicines the morning of surgery with A SIP OF WATER: AMANTADINE,CARBIDOPA-LEVADOPA,MIDRODINE,PRILOSEC,PAXIL,MIRAPEX   Do not wear jewelry, make-up or nail polish.  Do not wear lotions, powders, or perfumes. You may wear deodorant.  Do not shave 48 hours prior to surgery.  Do not bring valuables to the hospital.  Contacts, dentures or bridgework may not be worn into surgery.  Leave suitcase in the car. After surgery it may be brought to your room.  For patients admitted to the hospital, checkout time is 11:00 AM the day of discharge.   Patients discharged the day of surgery will not be allowed to drive home.  Name and phone number of your driver: [employer]  Special Instructions: CHG Shower Use Special Wash: 1/2 bottle night before surgery and 1/2 bottle morning of surgery.   Please read over the following fact sheets that you were given: Pain Booklet

## 2011-07-04 NOTE — Progress Notes (Signed)
WIFE REPORTS PENDING WOUND CULTURE.RESULT AVAILABLE 4/29.

## 2011-07-07 ENCOUNTER — Telehealth: Payer: Self-pay | Admitting: Licensed Clinical Social Worker

## 2011-07-07 NOTE — Telephone Encounter (Signed)
Patient's wife called stating that the patient has another infection behind the left ear where the surgery was at the end of leads,  and she wanted Dr. Luciana Axe to know so he can talk with Dr. Maeola Harman about this. The patient has started back on Daptomycin and she wanted Dr.Comer to know.

## 2011-07-07 NOTE — Telephone Encounter (Signed)
Can you get a copy of the clinic note from Dr. Rush Farmer office

## 2011-07-08 ENCOUNTER — Ambulatory Visit (HOSPITAL_COMMUNITY): Admission: RE | Admit: 2011-07-08 | Payer: Medicare Other | Source: Ambulatory Visit | Admitting: Neurosurgery

## 2011-07-08 ENCOUNTER — Encounter (HOSPITAL_COMMUNITY): Admission: RE | Payer: Self-pay | Source: Ambulatory Visit

## 2011-07-08 HISTORY — DX: Infection and inflammatory reaction due to other internal prosthetic devices, implants and grafts, initial encounter: T85.79XA

## 2011-07-08 SURGERY — SUBTHALAMIC STIMULATOR BATTERY REPLACEMENT
Anesthesia: General

## 2011-07-08 NOTE — Telephone Encounter (Signed)
Copy given to Dr. Luciana Axe

## 2011-07-10 ENCOUNTER — Telehealth: Payer: Self-pay | Admitting: *Deleted

## 2011-07-10 NOTE — Telephone Encounter (Signed)
I have talked to Dr. Rush Farmer today, please have the patient scheduled to see me.  The daptomycin was started by Dr. Rush Farmer, I am just finding out about it.

## 2011-07-10 NOTE — Telephone Encounter (Signed)
I spoke with Dr. Luciana Axe. They will need to make an appt & come in to discuss with md. I LM on her cell & called the home #. I told her she would need to bring her spouse to an appt to discuss. They phones are off now. She will call in the am tomorrow to make the appt

## 2011-07-10 NOTE — Telephone Encounter (Signed)
[name] called concerned about her husband still on the daptomycin. States he has been on it for over a month. He has to remain in the nursing home to get this drug due to cost. (the cost of the SNF is a concern as well).  States she & their son have joint POA for [name]. They want to meet with or speak with [name]. States the last culture that was done last Friday showed "no distinctive bacteria"  States he has bad shakes & is a total transfer. PT has stopped as he has stopped making progress. With out the battery "he has half the energy he used to. She is willing to make an appt & pay for it if needed so she can understand why he has to stay on this med.  Wife can be reached at home [phone] or her cell [phone]. [name], their son can be reached at [phone]  I assured her I will call her back or the doctor will call. I told her it may not be today. She was ok with waiting until tomorrow

## 2011-07-11 ENCOUNTER — Telehealth: Payer: Self-pay | Admitting: *Deleted

## 2011-07-11 ENCOUNTER — Ambulatory Visit (HOSPITAL_COMMUNITY)
Admission: RE | Admit: 2011-07-11 | Discharge: 2011-07-11 | Disposition: A | Discharge status: Home / Self Care | Payer: Medicare Other | Source: Ambulatory Visit | Attending: Internal Medicine | Admitting: Internal Medicine

## 2011-07-11 ENCOUNTER — Other Ambulatory Visit: Payer: Self-pay | Admitting: Internal Medicine

## 2011-07-11 DIAGNOSIS — M869 Osteomyelitis, unspecified: Secondary | ICD-10-CM | POA: Insufficient documentation

## 2011-07-11 DIAGNOSIS — Z452 Encounter for adjustment and management of vascular access device: Secondary | ICD-10-CM | POA: Insufficient documentation

## 2011-07-11 NOTE — Telephone Encounter (Signed)
Spoke with Edgardo Roys, patient's nurse at [facility], she did understand the change in IV antibiotics.  Also spoke with Delorise Shiner, patient's wife and she has concerns and wanted a follow up appointment with Dr. Luciana Axe.  Given appointment for 07/17/11 at 9:30 AM. Wendall Mola CMA

## 2011-07-11 NOTE — Procedures (Signed)
Procedure : RT SL PICC exchange under fluoro as has been pulled back 40 cm in length with tip at cavoatrial junction  No complications.  Patient tolerated well.  Ready for usse.

## 2011-07-11 NOTE — Telephone Encounter (Signed)
Message copied by Macy Mis on Fri Jul 11, 2011  2:40 PM ------      Message from: Gardiner Barefoot      Created: Fri Jul 11, 2011 12:13 PM       Can you confirm with the patients nursing home [facility]) that the antibiotic was changed to Teflaro600 mg IV q 12 hours instead of the daptomycin.  I talked to someone there today but not sure if she understood.  thanks

## 2011-07-17 ENCOUNTER — Ambulatory Visit (INDEPENDENT_AMBULATORY_CARE_PROVIDER_SITE_OTHER): Payer: Medicare Other | Admitting: Internal Medicine

## 2011-07-17 ENCOUNTER — Encounter: Payer: Self-pay | Admitting: Internal Medicine

## 2011-07-17 ENCOUNTER — Other Ambulatory Visit: Payer: Self-pay | Admitting: Neurosurgery

## 2011-07-17 VITALS — BP 122/75 | HR 92 | Temp 98.1°F | Wt 211.0 lb

## 2011-07-17 DIAGNOSIS — L089 Local infection of the skin and subcutaneous tissue, unspecified: Secondary | ICD-10-CM

## 2011-07-17 DIAGNOSIS — A499 Bacterial infection, unspecified: Secondary | ICD-10-CM

## 2011-07-17 DIAGNOSIS — B9689 Other specified bacterial agents as the cause of diseases classified elsewhere: Secondary | ICD-10-CM

## 2011-07-17 NOTE — Assessment & Plan Note (Signed)
Although there was hope to treat through the infection, I discussed with the patient that I certainly agree to have all hardware removed despite the fact that this means likely that he will need to be continued on Parkinson's medicines and we'll not have a neurostimulator for control. I have started him on Teflaro but likely the drainage will not resolve until all hardware out. I did discuss with her that I would continue the antibiotics post removal for at least 2-3 weeks and will follow inflammatory markers. I did discuss with her, but I understand the difficulty due to the expense and need for placement but I certainly do recommend good IV therapy. Another option would be Zyvox, however his Paxil would need to be tapered to off and there are significant side effects with prolonged use of Zyvox that makes it a less than desirable option. I may consider that though, as a continuation after an surgery and after some time with Teflaro. This was discussed with the patient and wife and I will have them returned after surgery and certainly my partner will be available in the hospital when he is inpatient.

## 2011-07-17 NOTE — Progress Notes (Signed)
  Subjective:    Patient ID: [name], male    DOB: 1929/09/06, 78 y.o.   MRN: [id number]  HPI This patient comes in for followup of the neurostimulator infection. He has now developed drainage coming out of his left. Auricular area. No fever or chills. He was placed on Teflaro by myself and comes in now for followup. The patient tells me that, in discussion with Dr. Venetia Maxon, the neurosurgeon, the decision has been made to take out all of the hardware. The patient previously had a pocket infection in the batteries were taken out and the patient was doing well on therapy for culture-negative pocket infection in the hope at the time was 2 replaced the batteries after he prolonged course of IV antibiotics. Unfortunately now, with the drainage of the leads, he will proceed to have all removed and be managed with oral medications.   Review of Systems  Constitutional: Negative for fever, chills and unexpected weight change.  Respiratory: Negative.   Cardiovascular: Negative for chest pain, palpitations and leg swelling.  Gastrointestinal: Negative for nausea, vomiting, abdominal pain and diarrhea.  Musculoskeletal: Negative for joint swelling.  Skin: Negative for rash.  Neurological:       Continued problems with Parkinson's disease  Hematological: Negative.   Psychiatric/Behavioral: Negative for dysphoric mood. The patient is not nervous/anxious.        Objective:   Physical Exam  Constitutional: He appears well-developed and well-nourished.       In the wheelchair and moving consistent with active Parkinson's disease  Cardiovascular: Normal rate, regular rhythm and normal heart sounds.  Exam reveals no gallop and no friction rub.   No murmur heard. Skin: No rash noted.       Drainage behind the left ear          Assessment & Plan:

## 2011-07-18 ENCOUNTER — Encounter (HOSPITAL_COMMUNITY): Payer: Self-pay | Admitting: Pharmacy Technician

## 2011-07-23 MED ORDER — CEFAZOLIN SODIUM-DEXTROSE 2-3 GM-% IV SOLR
2.0000 g | INTRAVENOUS | Status: AC
  Administered 2011-07-24: 2 g via INTRAVENOUS
  Filled 2011-07-23: qty 50

## 2011-07-23 NOTE — H&P (Signed)
[name]  #[id number] DOB:  09/18/1929 07/16/2011:  Lockie Mola returns today. He unfortunately has had drainage from behind his left ear and at this point we need to remove all of his implanted hardware.  We are going to speak to Dr. Sandria Manly about getting him on a better Sinemet regimen. Currently he is taking one Sinemet at 6:00 AM, half a Mirapex at 6:00 AM, one Sinemet at 9:00 AM, half a Mirapex at 9:00 AM, .5 Sinemet at 12:00 PM, 1 mg. of Mirapex at 12:00 PM, half tablet of Sinemet at 3:00 PM, .5 mg. tablet of Mirapex at 3:00 PM, one full tablet of Sinemet at 6:00 PM, .5 mg. tablet of Mirapex at 6:00 PM.  These are 25/200 50 tablets of Sinemet and .5 mg. tablets of Mirapex.  He is also on Amantadine 100 mg. twice daily.    At this point unfortunately we need to remove all of his implanted hardware and we are speaking to Dr. Sandria Manly about the need for increasing his medications to compensate for his loss of stimulator device.  I spoke with him and his wife, who are quite upset, at great length. We discussed alternatives, but I do not think there are any very good alternatives available to [place] at this point.  We will plan on proceeding with surgery in the very near future. I will speak with Dr. Luciana Axe regarding the need for longer term antibiotics after all hardware is removed.          Danae Orleans. Venetia Maxon, M.D./[facility]   Muhammed B. Hotz  DOB: [birth year]  #[id number]    Jul 17, 2005   HISTORY OF PRESENT ILLNESS:  Navarre Diana is a 78 year old, right-handed, retired Professor of Biology who presents at the request of Dr. Jac Canavan for neurosurgical consultation for implantable pulse generator depletion.  He has undergone subthalamic DBS placement for Parkinson's, which was done in [place], [place] in April of 2002.  He underwent DBS battery implantation with bilateral Soletra batteries; however, one was positioned in the left interclavicular site and the other was placed in the subcostal site.  The subcostal site is for the right DBS electrode, the left interclavicular is for the left DBC electrode.  This was done on 06/09/00.  He comes in today with worsening symptoms and with battery depletion.    Mr. Relph currently says the right side of his body is worse.  He was initially diagnosed with Parkinson's in 1987.  He said his left side was worse, however, now his right side is worse, and he has difficulty with tremors and rigidity since 12/2004 at which point he underwent back surgery, which was done by Dr. Otelia Sergeant. He says that his rigidity has been worse since then.  He has been taking Mirapex .5 mg. every three hours and Carbidopa 25/250 also every three hours.  He has had heart stents placed in 2005 and had a right total knee arthroplasty.  He has had an exploratory laparotomy, appendectomy, and left heel spur removed, in addition to recent lumbar surgery by Dr. Vira Browns.  He had a previous surgery on his low back in June of 2005.  He says he has been working in an exercise group with Bonney Leitz twice a week and has noted a lot more difficulty with his Parkinson's.    He comes in today specifically for battery depletion for the need of battery revision.    REVIEW OF SYSTEMS:   A detailed Review of  Systems sheet was reviewed with the patient.  Pertinent positives include under eyes - wears glasses, cataracts; under ear, nose, throat and mouth - nasal congestion/drainage, inability to smell, sinus problems; under cardiovascular - swelling in feet and legs; under musculoskeletal - back pain, arthritis, neck pain.  All other systems are negative; this includes Constitutional symptoms, Endocrine, Respiratory, Gastrointestinal, Genitourinary, Integumentary & Breast, Neurologic, Psychiatric, Hematologic/Lymphatic, Allergic/Immunologic.    PAST MEDICAL HISTORY:      Current Medical Conditions:    Previously described.     Prior Operations and Hospitalizations:   Previously described.       Medications and Allergies:  Current medications - Mirapex .5 mg. po q3h, Carbidopa  of 25/250 po q3h, Aspirin 81 mg. qd, Plavix 75 mg. qd, Paroxetine 20 mg. qd, Lipitor 80 mg. qd, Furosemide 40 mg. qd, Potassium 20 mg. qd, Propoxyphene 100/650 prn.  His ALLERGIES ARE POSSIBLY TO NITROGLYCERIN AND MORPHINE.      Height and Weight:     He is 6' tall, 218 lbs.    SOCIAL HISTORY:    He is a nonsmoker, nondrinker, no history of substance abuse.    PHYSICAL EXAMINATION:      General Appearance:   On examination today, Mr. Dowell is a pleasant, cooperative, older man in no acute distress.      Blood Pressure, Pulse:     His blood pressure is 122/82 in the left arm seated.  Heart rate is 72 and regular.      HEENT - normocephalic, atraumatic.  The pupils are equal, round and reactive to light.  The extraocular muscles are intact.  Sclerae - white.  Conjunctiva - pink.  Oropharynx benign.  Uvula midline.     Neck - there are no masses, meningismus, deformities, tracheal deviation, jugular vein distention or carotid bruits.  There is normal cervical range of motion.  Spurlings' test is negative without reproducible radicular pain turning the patient's head to either side.  Lhermitte's sign is not present with axial compression.      Respiratory - there is normal respiratory effort with good intercostal function.  Lungs are clear to auscultation.  There are no rales, rhonchi or wheezes.      Cardiovascular - the heart has regular rate and rhythm to auscultation.  No murmurs are appreciated.  There is no extremity edema, cyanosis or clubbing.  There are palpable pedal pulses.      Abdomen - soft, nontender, no hepatosplenomegaly appreciated or masses.  There are active bowel sounds.  No guarding or rebound.  He has an interclavicular IBG implantable pulse generator and a subcostal implantable pulse generator, both on the left, and there appears to be no complications related to those.    Musculoskeletal  Examination - the patient is able to walk about the examining room with a normal, casual, heel and toe gait.    NEUROLOGICAL EXAMINATION: The patient is oriented to time, person and place and has good recall of both recent and remote memory with normal attention span and concentration.  The patient speaks with clear and fluent speech and exhibits normal language function and appropriate fund of knowledge.  He is awake, alert and conversant.      Cranial Nerve Examination - pupils are equal, round and reactive to light.  Extraocular movements are full.  Visual fields are full to confrontational testing.  Facial sensation and facial movement are symmetric and intact.  Hearing is intact to finger rub.  Palate is upgoing.  Shoulder shrug  is symmetric.  Tongue protrudes in the midline.      Motor Examination - He has a wide-based and unsteady gait.  He has a Parkinsonian tremor, right greater than left and increased tone bilaterally, also right greater than left.         Deep Tendon Reflexes - reflexes are 1 and symmetric in the upper and lower extremities bilaterally.         Cerebellar Examination - normal coordination in upper and lower extremities and normal rapid alternating movements.  Romberg test is negative.    IMPRESSION AND RECOMMENDATIONS: Anden Bartolo is a 78 year old man with Parkinson's. He has a depleted implantable pulse generator.  We got no signal from the left interclavicular IBG. We do get some transmission from the left subcostal IBG.  At this point both of these pulse generators should be changed. We would like to have him assessed by Dr. Juanda Chance to make sure there is no problem with him coming off the Plavix. I would like him to be off Plavix for at least ten days before going ahead with surgery.    [facility]    Danae Orleans. Venetia Maxon, M.D.

## 2011-07-23 NOTE — Pre-Procedure Instructions (Signed)
20 [name]  07/23/2011   Your procedure is scheduled on:  MAY 16 @ 1110 AM  Report to [facility] at AS DIRECTED BY DR [facility] OFFICE  Call this number if you have problems the morning of surgery: [phone]   Remember:   Do not eat food:After Midnight.  May have clear liquids: up to 4 Hours before arrival.  Clear liquids include soda, tea, black coffee, apple or grape juice, broth.  Take these medicines the morning of surgery with A SIP OF WATER AM MEDS AMANTADINE,SINEMET,SINEMET IR,ADVAIR,MIDRODINE,PRILOSEC,PAXIL,MIRAPEX  Do not wear jewelry, make-up or nail polish.  Do not wear lotions, powders, or perfumes. You may wear deodorant.  Do not shave 48 hours prior to surgery. Men may shave face and neck.  Do not bring valuables to the hospital.  Contacts, dentures or bridgework may not be worn into surgery.  Leave suitcase in the car. After surgery it may be brought to your room.  For patients admitted to the hospital, checkout time is 11:00 AM the day of discharge.   Patients discharged the day of surgery will not be allowed to drive home.  Name and phone number of your driver: [place] [phone]  Special Instructions: CHG Shower Use Special Wash: 1/2 bottle night before surgery and 1/2 bottle morning of surgery.   Please read over the following fact sheets that you were given: Care and Recovery After Surgery

## 2011-07-24 ENCOUNTER — Ambulatory Visit (HOSPITAL_COMMUNITY): Payer: Medicare Other | Admitting: Certified Registered"

## 2011-07-24 ENCOUNTER — Encounter (HOSPITAL_COMMUNITY)
Admission: RE | Disposition: A | Discharge status: Rehabilitation Facility | Payer: Self-pay | Source: Ambulatory Visit | Attending: Neurosurgery

## 2011-07-24 ENCOUNTER — Inpatient Hospital Stay (HOSPITAL_COMMUNITY): Payer: Medicare Other

## 2011-07-24 ENCOUNTER — Encounter (HOSPITAL_COMMUNITY): Payer: Self-pay | Admitting: General Practice

## 2011-07-24 ENCOUNTER — Encounter (HOSPITAL_COMMUNITY): Payer: Self-pay | Admitting: *Deleted

## 2011-07-24 ENCOUNTER — Encounter (HOSPITAL_COMMUNITY): Payer: Self-pay | Admitting: Certified Registered"

## 2011-07-24 ENCOUNTER — Inpatient Hospital Stay (HOSPITAL_COMMUNITY)
Admission: RE | Admit: 2011-07-24 | Discharge: 2011-07-28 | DRG: 027 | Disposition: A | Discharge status: Rehabilitation Facility | Payer: Medicare Other | Source: Ambulatory Visit | Attending: Neurosurgery | Admitting: Neurosurgery

## 2011-07-24 DIAGNOSIS — I872 Venous insufficiency (chronic) (peripheral): Secondary | ICD-10-CM | POA: Diagnosis present

## 2011-07-24 DIAGNOSIS — Z96659 Presence of unspecified artificial knee joint: Secondary | ICD-10-CM

## 2011-07-24 DIAGNOSIS — T85738A Infection and inflammatory reaction due to other nervous system device, implant or graft, initial encounter: Principal | ICD-10-CM | POA: Diagnosis present

## 2011-07-24 DIAGNOSIS — K219 Gastro-esophageal reflux disease without esophagitis: Secondary | ICD-10-CM | POA: Diagnosis present

## 2011-07-24 DIAGNOSIS — Y831 Surgical operation with implant of artificial internal device as the cause of abnormal reaction of the patient, or of later complication, without mention of misadventure at the time of the procedure: Secondary | ICD-10-CM | POA: Diagnosis present

## 2011-07-24 DIAGNOSIS — G20 Parkinson's disease: Secondary | ICD-10-CM | POA: Diagnosis present

## 2011-07-24 DIAGNOSIS — Z79899 Other long term (current) drug therapy: Secondary | ICD-10-CM

## 2011-07-24 DIAGNOSIS — Z9861 Coronary angioplasty status: Secondary | ICD-10-CM

## 2011-07-24 DIAGNOSIS — I10 Essential (primary) hypertension: Secondary | ICD-10-CM | POA: Diagnosis present

## 2011-07-24 DIAGNOSIS — S98919A Complete traumatic amputation of unspecified foot, level unspecified, initial encounter: Secondary | ICD-10-CM

## 2011-07-24 DIAGNOSIS — I2510 Atherosclerotic heart disease of native coronary artery without angina pectoris: Secondary | ICD-10-CM | POA: Diagnosis present

## 2011-07-24 DIAGNOSIS — I4891 Unspecified atrial fibrillation: Secondary | ICD-10-CM | POA: Diagnosis present

## 2011-07-24 DIAGNOSIS — E785 Hyperlipidemia, unspecified: Secondary | ICD-10-CM | POA: Diagnosis present

## 2011-07-24 DIAGNOSIS — Z7982 Long term (current) use of aspirin: Secondary | ICD-10-CM

## 2011-07-24 DIAGNOSIS — M1990 Unspecified osteoarthritis, unspecified site: Secondary | ICD-10-CM | POA: Diagnosis present

## 2011-07-24 DIAGNOSIS — G20A1 Parkinson's disease without dyskinesia, without mention of fluctuations: Secondary | ICD-10-CM | POA: Diagnosis present

## 2011-07-24 HISTORY — DX: Delirium due to known physiological condition: F05

## 2011-07-24 HISTORY — PX: SUBTHALAMIC STIMULATOR REMOVAL: SHX5406

## 2011-07-24 HISTORY — DX: Myoneural disorder, unspecified: G70.9

## 2011-07-24 LAB — CBC
HCT: 34 % — ABNORMAL LOW (ref 39.0–52.0)
Hemoglobin: 11.5 g/dL — ABNORMAL LOW (ref 13.0–17.0)
MCH: 30.3 pg (ref 26.0–34.0)
MCH: 29.9 pg (ref 26.0–34.0)
MCHC: 33.4 g/dL (ref 30.0–36.0)
MCV: 90.8 fL (ref 78.0–100.0)
MCV: 90.7 fL (ref 78.0–100.0)
Platelets: 230 10*3/uL (ref 150–400)
RBC: 3.79 MIL/uL — ABNORMAL LOW (ref 4.22–5.81)
RBC: 3.75 MIL/uL — ABNORMAL LOW (ref 4.22–5.81)
RDW: 14 % (ref 11.5–15.5)
WBC: 10.7 10*3/uL — ABNORMAL HIGH (ref 4.0–10.5)

## 2011-07-24 LAB — BASIC METABOLIC PANEL
BUN: 25 mg/dL — ABNORMAL HIGH (ref 6–23)
Creatinine, Ser: 1.47 mg/dL — ABNORMAL HIGH (ref 0.50–1.35)

## 2011-07-24 LAB — SURGICAL PCR SCREEN
MRSA, PCR: NEGATIVE
Staphylococcus aureus: NEGATIVE

## 2011-07-24 SURGERY — SUBTHALAMIC STIMULATOR REMOVAL
Anesthesia: General

## 2011-07-24 MED ORDER — TRAMADOL HCL 50 MG PO TABS: 50.0000 mg | ORAL_TABLET | Freq: Four times a day (QID) | ORAL | Status: DC | PRN

## 2011-07-24 MED ORDER — CARBIDOPA-LEVODOPA 25-250 MG PO TABS
0.5000 | ORAL_TABLET | Freq: Three times a day (TID) | ORAL | Status: DC
  Filled 2011-07-24 (×2): qty 1

## 2011-07-24 MED ORDER — CARBIDOPA-LEVODOPA 25-250 MG PO TABS
1.0000 | ORAL_TABLET | ORAL | Status: DC
  Administered 2011-07-25 – 2011-07-28 (×15): 1 via ORAL
  Filled 2011-07-24 (×19): qty 1

## 2011-07-24 MED ORDER — MUPIROCIN 2 % EX OINT
TOPICAL_OINTMENT | CUTANEOUS | Status: AC
  Filled 2011-07-24: qty 22

## 2011-07-24 MED ORDER — 0.9 % SODIUM CHLORIDE (POUR BTL) OPTIME
TOPICAL | Status: DC | PRN
  Administered 2011-07-24: 1000 mL

## 2011-07-24 MED ORDER — THROMBIN 5000 UNITS EX SOLR
CUTANEOUS | Status: DC | PRN
  Administered 2011-07-24 (×2): 5000 [IU] via TOPICAL

## 2011-07-24 MED ORDER — HYDROCODONE-ACETAMINOPHEN 5-325 MG PO TABS
1.0000 | ORAL_TABLET | ORAL | Status: DC | PRN
  Administered 2011-07-24: 1 via ORAL
  Filled 2011-07-24: qty 1

## 2011-07-24 MED ORDER — BACITRACIN 50000 UNITS IM SOLR
INTRAMUSCULAR | Status: AC
  Filled 2011-07-24: qty 1

## 2011-07-24 MED ORDER — SODIUM CHLORIDE 0.9 % IV SOLN: 250.0000 mL | INTRAVENOUS | Status: DC

## 2011-07-24 MED ORDER — MIDODRINE HCL 5 MG PO TABS
5.0000 mg | ORAL_TABLET | Freq: Two times a day (BID) | ORAL | Status: DC
  Administered 2011-07-24 – 2011-07-28 (×6): 5 mg via ORAL
  Filled 2011-07-24 (×11): qty 1

## 2011-07-24 MED ORDER — PHENOL 1.4 % MT LIQD: 1.0000 | OROMUCOSAL | Status: DC | PRN

## 2011-07-24 MED ORDER — SODIUM CHLORIDE 0.9 % IV SOLN
600.0000 mg | Freq: Two times a day (BID) | INTRAVENOUS | Status: DC
  Filled 2011-07-24: qty 600

## 2011-07-24 MED ORDER — ALBUTEROL SULFATE (5 MG/ML) 0.5% IN NEBU: 2.5000 mg | INHALATION_SOLUTION | Freq: Four times a day (QID) | RESPIRATORY_TRACT | Status: DC | PRN

## 2011-07-24 MED ORDER — ONDANSETRON HCL 4 MG/2ML IJ SOLN: 4.0000 mg | INTRAMUSCULAR | Status: DC | PRN

## 2011-07-24 MED ORDER — FLUTICASONE-SALMETEROL 100-50 MCG/DOSE IN AEPB
1.0000 | INHALATION_SPRAY | Freq: Two times a day (BID) | RESPIRATORY_TRACT | Status: DC
  Administered 2011-07-24 – 2011-07-28 (×8): 1 via RESPIRATORY_TRACT
  Filled 2011-07-24: qty 14

## 2011-07-24 MED ORDER — AMANTADINE HCL 100 MG PO CAPS
100.0000 mg | ORAL_CAPSULE | Freq: Two times a day (BID) | ORAL | Status: DC
  Administered 2011-07-24 – 2011-07-28 (×8): 100 mg via ORAL
  Filled 2011-07-24 (×10): qty 1

## 2011-07-24 MED ORDER — PHENYLEPHRINE HCL 10 MG/ML IJ SOLN
INTRAMUSCULAR | Status: DC | PRN
  Administered 2011-07-24: 80 ug via INTRAVENOUS

## 2011-07-24 MED ORDER — PROPOFOL 10 MG/ML IV BOLUS
INTRAVENOUS | Status: DC | PRN
  Administered 2011-07-24: 110 mg via INTRAVENOUS
  Administered 2011-07-24: 20 mg via INTRAVENOUS

## 2011-07-24 MED ORDER — SODIUM CHLORIDE 0.9 % IV SOLN
INTRAVENOUS | Status: AC
  Filled 2011-07-24: qty 500

## 2011-07-24 MED ORDER — CEFAZOLIN SODIUM 1-5 GM-% IV SOLN: 1.0000 g | Freq: Three times a day (TID) | INTRAVENOUS | Status: DC

## 2011-07-24 MED ORDER — CARBIDOPA-LEVODOPA 25-250 MG PO TABS
2.0000 | ORAL_TABLET | ORAL | Status: DC
  Administered 2011-07-25 – 2011-07-28 (×4): 2 via ORAL
  Filled 2011-07-24 (×4): qty 2

## 2011-07-24 MED ORDER — SODIUM CHLORIDE 0.9 % IV SOLN
570.0000 mg | INTRAVENOUS | Status: DC
  Filled 2011-07-24: qty 11.4

## 2011-07-24 MED ORDER — LINEZOLID 600 MG PO TABS
600.0000 mg | ORAL_TABLET | Freq: Two times a day (BID) | ORAL | Status: DC
  Administered 2011-07-24 – 2011-07-28 (×8): 600 mg via ORAL
  Filled 2011-07-24 (×10): qty 1

## 2011-07-24 MED ORDER — ACETAMINOPHEN 325 MG PO TABS: 650.0000 mg | ORAL_TABLET | ORAL | Status: DC | PRN

## 2011-07-24 MED ORDER — PRAMIPEXOLE DIHYDROCHLORIDE 1 MG PO TABS
1.0000 mg | ORAL_TABLET | Freq: Every day | ORAL | Status: DC
  Filled 2011-07-24: qty 1

## 2011-07-24 MED ORDER — LACTATED RINGERS IV SOLN
INTRAVENOUS | Status: DC | PRN
  Administered 2011-07-24: 10:00:00 via INTRAVENOUS

## 2011-07-24 MED ORDER — B COMPLEX PO TABS: 1.0000 | ORAL_TABLET | Freq: Every day | ORAL | Status: DC

## 2011-07-24 MED ORDER — MELOXICAM 15 MG PO TABS
15.0000 mg | ORAL_TABLET | Freq: Every day | ORAL | Status: DC
  Administered 2011-07-25 – 2011-07-28 (×4): 15 mg via ORAL
  Filled 2011-07-24 (×4): qty 1

## 2011-07-24 MED ORDER — PRAMIPEXOLE DIHYDROCHLORIDE 0.25 MG PO TABS
0.5000 mg | ORAL_TABLET | ORAL | Status: DC
  Administered 2011-07-25 – 2011-07-28 (×15): 0.5 mg via ORAL
  Filled 2011-07-24 (×20): qty 2

## 2011-07-24 MED ORDER — PAROXETINE HCL 20 MG PO TABS: 20.0000 mg | ORAL_TABLET | Freq: Every day | ORAL | Status: DC

## 2011-07-24 MED ORDER — B COMPLEX-C PO TABS
1.0000 | ORAL_TABLET | Freq: Every day | ORAL | Status: DC
  Administered 2011-07-25 – 2011-07-28 (×4): 1 via ORAL
  Filled 2011-07-24 (×4): qty 1

## 2011-07-24 MED ORDER — HYDROCODONE-ACETAMINOPHEN 5-325 MG PO TABS: 1.0000 | ORAL_TABLET | Freq: Four times a day (QID) | ORAL | Status: DC | PRN

## 2011-07-24 MED ORDER — HYDROMORPHONE HCL PF 1 MG/ML IJ SOLN: 0.2500 mg | INTRAMUSCULAR | Status: DC | PRN

## 2011-07-24 MED ORDER — PRAMIPEXOLE DIHYDROCHLORIDE 0.25 MG PO TABS: 0.5000 mg | ORAL_TABLET | Freq: Every day | ORAL | Status: DC

## 2011-07-24 MED ORDER — HEMOSTATIC AGENTS (NO CHARGE) OPTIME
TOPICAL | Status: DC | PRN
  Administered 2011-07-24 (×2): 1 via TOPICAL

## 2011-07-24 MED ORDER — OXYCODONE-ACETAMINOPHEN 5-325 MG PO TABS
1.0000 | ORAL_TABLET | ORAL | Status: DC | PRN
  Administered 2011-07-27: 2 via ORAL
  Filled 2011-07-24: qty 2

## 2011-07-24 MED ORDER — ASPIRIN EC 81 MG PO TBEC
81.0000 mg | DELAYED_RELEASE_TABLET | Freq: Every day | ORAL | Status: DC
  Administered 2011-07-25 – 2011-07-28 (×4): 81 mg via ORAL
  Filled 2011-07-24 (×5): qty 1

## 2011-07-24 MED ORDER — PRAMIPEXOLE DIHYDROCHLORIDE 1 MG PO TABS
1.0000 mg | ORAL_TABLET | ORAL | Status: DC
  Administered 2011-07-25 – 2011-07-28 (×4): 1 mg via ORAL
  Filled 2011-07-24 (×4): qty 1

## 2011-07-24 MED ORDER — PANTOPRAZOLE SODIUM 40 MG PO TBEC
40.0000 mg | DELAYED_RELEASE_TABLET | Freq: Every day | ORAL | Status: DC
  Administered 2011-07-25 – 2011-07-28 (×4): 40 mg via ORAL
  Filled 2011-07-24 (×5): qty 1

## 2011-07-24 MED ORDER — MUPIROCIN 2 % EX OINT
TOPICAL_OINTMENT | Freq: Two times a day (BID) | CUTANEOUS | Status: DC
  Administered 2011-07-24 – 2011-07-26 (×5): via NASAL
  Administered 2011-07-27: 1 via NASAL
  Administered 2011-07-27 – 2011-07-28 (×2): via NASAL
  Filled 2011-07-24 (×2): qty 22

## 2011-07-24 MED ORDER — FLUTICASONE PROPIONATE 50 MCG/ACT NA SUSP
1.0000 | Freq: Every day | NASAL | Status: DC | PRN
  Filled 2011-07-24: qty 16

## 2011-07-24 MED ORDER — ACETAMINOPHEN 650 MG RE SUPP: 650.0000 mg | RECTAL | Status: DC | PRN

## 2011-07-24 MED ORDER — ONDANSETRON HCL 4 MG/2ML IJ SOLN
INTRAMUSCULAR | Status: DC | PRN
  Administered 2011-07-24: 4 mg via INTRAVENOUS

## 2011-07-24 MED ORDER — PRAMIPEXOLE DIHYDROCHLORIDE 0.25 MG PO TABS
0.5000 mg | ORAL_TABLET | ORAL | Status: DC
  Filled 2011-07-24: qty 2

## 2011-07-24 MED ORDER — PRAMIPEXOLE DIHYDROCHLORIDE 0.25 MG PO TABS
0.5000 mg | ORAL_TABLET | ORAL | Status: AC
  Administered 2011-07-24 (×2): 0.5 mg via ORAL
  Filled 2011-07-24 (×2): qty 2

## 2011-07-24 MED ORDER — CARBIDOPA-LEVODOPA 25-250 MG PO TABS
2.0000 | ORAL_TABLET | Freq: Once | ORAL | Status: AC
  Administered 2011-07-24: 2 via ORAL
  Filled 2011-07-24: qty 2

## 2011-07-24 MED ORDER — PAROXETINE HCL 10 MG PO TABS
10.0000 mg | ORAL_TABLET | Freq: Every day | ORAL | Status: DC
  Administered 2011-07-25: 10 mg via ORAL
  Filled 2011-07-24: qty 1

## 2011-07-24 MED ORDER — CEFTAROLINE FOSAMIL 600 MG IV SOLR
600.0000 mg | Freq: Two times a day (BID) | INTRAVENOUS | Status: DC
  Filled 2011-07-24 (×2): qty 600

## 2011-07-24 MED ORDER — MENTHOL 3 MG MT LOZG: 1.0000 | LOZENGE | OROMUCOSAL | Status: DC | PRN

## 2011-07-24 MED ORDER — DOCUSATE SODIUM 100 MG PO CAPS
100.0000 mg | ORAL_CAPSULE | Freq: Two times a day (BID) | ORAL | Status: DC
  Administered 2011-07-25 – 2011-07-28 (×7): 100 mg via ORAL
  Filled 2011-07-24 (×8): qty 1

## 2011-07-24 MED ORDER — SODIUM CHLORIDE 0.9 % IJ SOLN: 3.0000 mL | INTRAMUSCULAR | Status: DC | PRN

## 2011-07-24 MED ORDER — ONDANSETRON HCL 4 MG/2ML IJ SOLN: 4.0000 mg | Freq: Once | INTRAMUSCULAR | Status: DC | PRN

## 2011-07-24 MED ORDER — FENTANYL CITRATE 0.05 MG/ML IJ SOLN
INTRAMUSCULAR | Status: DC | PRN
  Administered 2011-07-24: 50 ug via INTRAVENOUS
  Administered 2011-07-24: 100 ug via INTRAVENOUS

## 2011-07-24 MED ORDER — CARBIDOPA-LEVODOPA 25-250 MG PO TABS
1.0000 | ORAL_TABLET | Freq: Two times a day (BID) | ORAL | Status: DC
  Filled 2011-07-24 (×2): qty 1

## 2011-07-24 MED ORDER — VITAMIN B-6 50 MG PO TABS
50.0000 mg | ORAL_TABLET | Freq: Every day | ORAL | Status: DC
  Administered 2011-07-24 – 2011-07-28 (×5): 50 mg via ORAL
  Filled 2011-07-24 (×5): qty 1

## 2011-07-24 MED ORDER — IPRATROPIUM-ALBUTEROL 0.5-2.5 (3) MG/3ML IN SOLN
3.0000 mL | Freq: Four times a day (QID) | RESPIRATORY_TRACT | Status: DC | PRN
Start: 2011-07-24 — End: 2011-07-24

## 2011-07-24 MED ORDER — KCL IN DEXTROSE-NACL 20-5-0.45 MEQ/L-%-% IV SOLN
INTRAVENOUS | Status: DC
  Administered 2011-07-24 – 2011-07-25 (×3): via INTRAVENOUS
  Filled 2011-07-24 (×6): qty 1000

## 2011-07-24 MED ORDER — IPRATROPIUM BROMIDE 0.02 % IN SOLN: 0.5000 mg | Freq: Four times a day (QID) | RESPIRATORY_TRACT | Status: DC | PRN

## 2011-07-24 MED ORDER — DM-GUAIFENESIN ER 30-600 MG PO TB12
1.0000 | ORAL_TABLET | Freq: Two times a day (BID) | ORAL | Status: DC
  Administered 2011-07-24 – 2011-07-28 (×8): 1 via ORAL
  Filled 2011-07-24 (×10): qty 1

## 2011-07-24 MED ORDER — SODIUM CHLORIDE 0.9 % IV SOLN
600.0000 mg | Freq: Two times a day (BID) | INTRAVENOUS | Status: DC
  Filled 2011-07-24 (×2): qty 600

## 2011-07-24 MED ORDER — SODIUM CHLORIDE 0.9 % IJ SOLN
3.0000 mL | Freq: Two times a day (BID) | INTRAMUSCULAR | Status: DC
  Administered 2011-07-24 – 2011-07-27 (×6): 3 mL via INTRAVENOUS

## 2011-07-24 MED ORDER — POLYETHYLENE GLYCOL 3350 17 G PO PACK: 17.0000 g | PACK | Freq: Every day | ORAL | Status: DC | PRN

## 2011-07-24 MED ORDER — CARBIDOPA-LEVODOPA 25-250 MG PO TABS
1.0000 | ORAL_TABLET | Freq: Once | ORAL | Status: AC
  Administered 2011-07-24: 1 via ORAL
  Filled 2011-07-24: qty 1

## 2011-07-24 SURGICAL SUPPLY — 56 items
BAG DECANTER FOR FLEXI CONT (MISCELLANEOUS) ×2 IMPLANT
BIT DRILL NEURO 2X3.1 SFT TUCH (MISCELLANEOUS) IMPLANT
BLADE SURG 10 STRL SS (BLADE) ×2 IMPLANT
BLADE SURG 15 STRL LF DISP TIS (BLADE) ×1 IMPLANT
BLADE SURG 15 STRL SS (BLADE) ×2
BOOT SUTURE AID YELLOW STND (SUTURE) IMPLANT
CANISTER SUCTION 2500CC (MISCELLANEOUS) ×2 IMPLANT
CLOTH BEACON ORANGE TIMEOUT ST (SAFETY) ×2 IMPLANT
CORDS BIPOLAR (ELECTRODE) ×2 IMPLANT
DRAPE INCISE IOBAN 66X45 STRL (DRAPES) ×2 IMPLANT
DRAPE PED LAPAROTOMY (DRAPES) ×2 IMPLANT
DRAPE POUCH INSTRU U-SHP 10X18 (DRAPES) ×2 IMPLANT
DRESSING TELFA 8X3 (GAUZE/BANDAGES/DRESSINGS) ×2 IMPLANT
DRILL NEURO 2X3.1 SOFT TOUCH (MISCELLANEOUS) ×2
DRSG OPSITE 4X5.5 SM (GAUZE/BANDAGES/DRESSINGS) ×1 IMPLANT
ELECT CAUTERY BLADE 6.4 (BLADE) ×2 IMPLANT
ELECT REM PT RETURN 9FT ADLT (ELECTROSURGICAL) ×2
ELECTRODE REM PT RTRN 9FT ADLT (ELECTROSURGICAL) ×1 IMPLANT
GAUZE SPONGE 4X4 16PLY XRAY LF (GAUZE/BANDAGES/DRESSINGS) ×3 IMPLANT
GLOVE BIO SURGEON STRL SZ8 (GLOVE) ×2 IMPLANT
GLOVE BIOGEL PI IND STRL 8 (GLOVE) ×1 IMPLANT
GLOVE BIOGEL PI IND STRL 8.5 (GLOVE) ×2 IMPLANT
GLOVE BIOGEL PI INDICATOR 8 (GLOVE) ×1
GLOVE BIOGEL PI INDICATOR 8.5 (GLOVE) ×2
GLOVE ECLIPSE 7.5 STRL STRAW (GLOVE) ×5 IMPLANT
GLOVE EXAM NITRILE LRG STRL (GLOVE) IMPLANT
GLOVE EXAM NITRILE MD LF STRL (GLOVE) ×1 IMPLANT
GLOVE EXAM NITRILE XL STR (GLOVE) IMPLANT
GLOVE EXAM NITRILE XS STR PU (GLOVE) IMPLANT
GLOVE INDICATOR 8.0 STRL GRN (GLOVE) ×2 IMPLANT
GOWN BRE IMP SLV AUR LG STRL (GOWN DISPOSABLE) IMPLANT
GOWN BRE IMP SLV AUR XL STRL (GOWN DISPOSABLE) IMPLANT
GOWN STRL REIN 2XL LVL4 (GOWN DISPOSABLE) IMPLANT
KIT BASIN OR (CUSTOM PROCEDURE TRAY) ×2 IMPLANT
KIT ROOM TURNOVER OR (KITS) ×2 IMPLANT
NDL HYPO 25X1 1.5 SAFETY (NEEDLE) ×1 IMPLANT
NEEDLE HYPO 25X1 1.5 SAFETY (NEEDLE) ×2 IMPLANT
NS IRRIG 1000ML POUR BTL (IV SOLUTION) ×2 IMPLANT
PACK SURGICAL SETUP 50X90 (CUSTOM PROCEDURE TRAY) ×2 IMPLANT
PENCIL BUTTON HOLSTER BLD 10FT (ELECTRODE) ×2 IMPLANT
SPONGE GAUZE 4X4 12PLY (GAUZE/BANDAGES/DRESSINGS) ×1 IMPLANT
STAPLER SKIN PROX WIDE 3.9 (STAPLE) ×3 IMPLANT
STRIP CLOSURE SKIN 1/2X4 (GAUZE/BANDAGES/DRESSINGS) ×2 IMPLANT
SUT ETHILON 3 0 PS 1 (SUTURE) ×4 IMPLANT
SUT SILK 2 0 TIES 10X30 (SUTURE) ×2 IMPLANT
SUT VIC AB 2-0 CP2 18 (SUTURE) ×3 IMPLANT
SUT VIC AB 3-0 SH 8-18 (SUTURE) ×3 IMPLANT
SWAB CULTURE LIQ STUART DBL (MISCELLANEOUS) ×2 IMPLANT
SWABSTICK BENZOIN STERILE (MISCELLANEOUS) ×2 IMPLANT
SYR BULB 3OZ (MISCELLANEOUS) ×2 IMPLANT
SYR BULB IRRIGATION 50ML (SYRINGE) ×1 IMPLANT
SYR CONTROL 10ML LL (SYRINGE) ×2 IMPLANT
TOWEL OR 17X24 6PK STRL BLUE (TOWEL DISPOSABLE) ×3 IMPLANT
TOWEL OR 17X26 10 PK STRL BLUE (TOWEL DISPOSABLE) ×2 IMPLANT
TUBE CONNECTING 12X1/4 (SUCTIONS) ×2 IMPLANT
WATER STERILE IRR 1000ML POUR (IV SOLUTION) ×2 IMPLANT

## 2011-07-24 NOTE — Progress Notes (Signed)
Patient ID: [name], male   DOB: [birth year], 78 y.o.   MRN: [id number]  Alert, conversant; wife at bedside. Drsgs intact, dry.    Clarification of Parkinson's meds per Dr. Venetia Maxon:    6am 9am 12noon     3pm 6pm Sinemet 25/250 1tab 1tab 1tab      2tabs 1tab Mirapex 0.5mg  1tab 1tab 2tabs      1tab 1tab  Amantadine 100mg  1cap BID   I.D. Consult requested by Dr. Venetia Maxon, spoke with Dr. Luciana Axe who agreed to relay to current MD. Social Work c/s requested, voicemail left for Sarah.  Hopeful of different SNF (other than [facility]) if IVAB chosen allows.  Per Dr. Venetia Maxon, will d/c Daptomycin & resume Teflaro pending I.D. Review.  Georgiann Cocker, RN, BSN

## 2011-07-24 NOTE — Progress Notes (Addendum)
RN made aware that PICC was in the upper right arm. MD notified. Decision made to replace PICC. Orders also given that it is ok to use the current PICC as a midline until able to replace it. IV team made aware.

## 2011-07-24 NOTE — Preoperative (Signed)
Beta Blockers   Reason not to administer Beta Blockers:Not Applicable 

## 2011-07-24 NOTE — Transfer of Care (Signed)
Immediate Anesthesia Transfer of Care Note  Patient: [name]  Procedure(s) Performed: Procedure(s) (LRB): SUBTHALAMIC STIMULATOR REMOVAL (N/A)  Patient Location: PACU  Anesthesia Type: General  Level of Consciousness: awake  Airway & Oxygen Therapy: Patient Spontanous Breathing and Patient connected to nasal cannula oxygen  Post-op Assessment: Report given to PACU RN, Post -op Vital signs reviewed and stable and Patient moving all extremities  Post vital signs: Reviewed and stable  Complications: No apparent anesthesia complications

## 2011-07-24 NOTE — Anesthesia Postprocedure Evaluation (Signed)
  Anesthesia Post-op Note  Patient: [name]  Procedure(s) Performed: Procedure(s) (LRB): SUBTHALAMIC STIMULATOR REMOVAL (N/A)  Patient Location: PACU  Anesthesia Type: General  Level of Consciousness: awake, alert  and oriented  Airway and Oxygen Therapy: Patient Spontanous Breathing  Post-op Pain: mild  Post-op Assessment: Post-op Vital signs reviewed and Patient's Cardiovascular Status Stable  Post-op Vital Signs: stable  Complications: No apparent anesthesia complications

## 2011-07-24 NOTE — Anesthesia Procedure Notes (Signed)
Procedure Name: Intubation Date/Time: 07/24/2011 11:22 AM Performed by: Jerilee Hoh Pre-anesthesia Checklist: Patient identified, Timeout performed, Emergency Drugs available, Suction available and Patient being monitored Patient Re-evaluated:Patient Re-evaluated prior to inductionOxygen Delivery Method: Circle system utilized Preoxygenation: Pre-oxygenation with 100% oxygen Intubation Type: IV induction Ventilation: Mask ventilation without difficulty Laryngoscope Size: Mac and 3 Grade View: Grade II Tube type: Oral Tube size: 7.5 mm Number of attempts: 1 Airway Equipment and Method: Stylet and Patient positioned with wedge pillow Placement Confirmation: positive ETCO2,  ETT inserted through vocal cords under direct vision and breath sounds checked- equal and bilateral Secured at: 23 cm Tube secured with: Tape Dental Injury: Teeth and Oropharynx as per pre-operative assessment

## 2011-07-24 NOTE — Interval H&P Note (Signed)
History and Physical Interval Note:  07/24/2011 7:44 AM  [name]  has presented today for surgery, with the diagnosis of Paralysis agitans  The various methods of treatment have been discussed with the patient and family. After consideration of risks, benefits and other options for treatment, the patient has consented to  Procedure(s) (LRB): SUBTHALAMIC STIMULATOR REMOVAL (N/A) as a surgical intervention .  The patients' history has been reviewed, patient examined, no change in status, stable for surgery.  I have reviewed the patients' chart and labs.  Questions were answered to the patient's satisfaction.     [name]  Date of Initial H&P: 07/16/2011  History reviewed, patient examined, no change in status, stable for surgery.

## 2011-07-24 NOTE — Op Note (Signed)
07/24/2011  12:47 PM  PATIENT:  [name]  78 y.o. male  PRE-OPERATIVE DIAGNOSIS:  Paralysis agitans  POST-OPERATIVE DIAGNOSIS:  Paralysis agitans  PROCEDURE:  Procedure(s) (LRB): SUBTHALAMIC STIMULATOR REMOVAL (N/A)  SURGEON:  Surgeon(s) and Role:    * Maeola Harman, MD - Primary  PHYSICIAN ASSISTANT:   ASSISTANTS: Poteat, RN   ANESTHESIA:   general  EBL:  Total I/O In: 500 [I.V.:500] Out: 10 [Blood:10]  BLOOD ADMINISTERED:none  DRAINS: none   LOCAL MEDICATIONS USED:  LIDOCAINE   SPECIMEN:  No Specimen  DISPOSITION OF SPECIMEN:  Microbiology  COUNTS:  YES  TOURNIQUET:  * No tourniquets in log *  DICTATION: Patient has draining wound in post-auricular region with 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  It was elected to take patient to surgery to remove all implanted devices.  Following smooth and uncomplicated induction of general endotracheal anesthesia, patient's scalp, chest and abdomen along with left post-auricular region were prepped and draped with betadine scrub and paint.  Both scalp incisions were opened and electrodes and caps were removed.  Abdominal incision was reopened and IPG was removed.  Post auricular incision was reopened and all remaining materials were removed.  All incisions were irrigated and closed with vicryl stitches and staples.  Sterile occlusive dressings were placed.  Patient was extuubated in the operating room and taken to recovery in stable condition having tolerated surgery well.  PLAN OF CARE: Admit to inpatient   PATIENT DISPOSITION:  PACU - hemodynamically stable.   Delay start of Pharmacological VTE agent (>24hrs) due to surgical blood loss or risk of bleeding: yes

## 2011-07-24 NOTE — Progress Notes (Signed)
IV team came to assess PICC. IV team informed RN that it looked infected and should be removed. MD called. Order to d/c PICC then start a peripheral was given. MD also stated to put the new PICC on hold until further investigation since Dr. Zenaida Niece Dams note states they might would recommend oral zyvox. IV team made aware.

## 2011-07-24 NOTE — Progress Notes (Addendum)
Subjective: No new complaints   Antibiotics:  Anti-infectives     Start     Dose/Rate Route Frequency Ordered Stop   07/24/11 2200   ceftaroline (TEFLARO) 600 mg in sodium chloride 0.9 % 250 mL IVPB        600 mg 250 mL/hr over 60 Minutes Intravenous Every 12 hours 07/24/11 1604     07/24/11 1800   ceftaroline (TEFLARO) injection 600 mg  Status:  Discontinued        600 mg Intravenous Every 12 hours 07/24/11 1430 07/24/11 1504   07/24/11 1800   ceftaroline (TEFLARO) 600 mg in sodium chloride 0.9 % 250 mL IVPB        600 mg 250 mL/hr over 60 Minutes Intravenous Every 12 hours 07/24/11 1504     07/24/11 1445   ceFAZolin (ANCEF) IVPB 1 g/50 mL premix  Status:  Discontinued        1 g 100 mL/hr over 30 Minutes Intravenous Every 8 hours 07/24/11 1430 07/24/11 1501   07/24/11 1200   DAPTOmycin (CUBICIN) 570 mg in sodium chloride 0.9 % IVPB  Status:  Discontinued        570 mg 222.8 mL/hr over 30 Minutes Intravenous To Surgery 07/24/11 1158 07/24/11 1415   07/24/11 1030   bacitracin 40981 UNITS injection     Comments: RATCLIFF, ESTHER: cabinet override         07/24/11 1030 07/24/11 2229   07/23/11 1423   ceFAZolin (ANCEF) IVPB 2 g/50 mL premix        2 g 100 mL/hr over 30 Minutes Intravenous 60 min pre-op 07/23/11 1423 07/24/11 1132          Medications: Scheduled Meds:   . amantadine  100 mg Oral BID  . aspirin EC  81 mg Oral Daily  . B-complex with vitamin C  1 tablet Oral Daily  . bacitracin      . carbidopa-levodopa  1 tablet Oral Once  . carbidopa-levodopa  1 tablet Oral Custom  . carbidopa-levodopa  2 tablet Oral Once  . carbidopa-levodopa  2 tablet Oral Q24H  .  ceFAZolin (ANCEF) IV  2 g Intravenous 60 min Pre-Op  . ceFTAROline (TEFLARO) IV  600 mg Intravenous Q12H  . ceFTAROline (TEFLARO) IV  600 mg Intravenous Q12H  . dextromethorphan-guaiFENesin  1 tablet Oral Q12H  . docusate sodium  100 mg Oral BID  . Fluticasone-Salmeterol  1 puff Inhalation Q12H  .  meloxicam  15 mg Oral Daily  . midodrine  5 mg Oral BID WC  . mupirocin ointment   Nasal BID  . pantoprazole  40 mg Oral Q1200  . PARoxetine  20 mg Oral Daily  . pramipexole  0.5 mg Oral Custom  . pramipexole  0.5 mg Oral Custom  . pramipexole  1 mg Oral Q24H  . sodium chloride      . sodium chloride  3 mL Intravenous Q12H  . DISCONTD: b complex vitamins  1 tablet Oral Daily  . DISCONTD: carbidopa-levodopa  0.5 tablet Oral TID  . DISCONTD: carbidopa-levodopa  1 tablet Oral Q12H  . DISCONTD:  ceFAZolin (ANCEF) IV  1 g Intravenous Q8H  . DISCONTD: ceftaroline  600 mg Intravenous Q12H  . DISCONTD: DAPTOmycin (CUBICIN)  IV  570 mg Intravenous To OR  . DISCONTD: mupirocin ointment      . DISCONTD: pramipexole  0.5 mg Oral Daily  . DISCONTD: pramipexole  0.5 mg Oral Q24H  . DISCONTD: pramipexole  1 mg Oral  Q1200   Continuous Infusions:   . sodium chloride    . dextrose 5 % and 0.45 % NaCl with KCl 20 mEq/L     PRN Meds:.acetaminophen, acetaminophen, albuterol, fluticasone, HYDROcodone-acetaminophen, HYDROcodone-acetaminophen, ipratropium, menthol-cetylpyridinium, ondansetron (ZOFRAN) IV, oxyCODONE-acetaminophen, phenol, polyethylene glycol, sodium chloride, traMADol, DISCONTD: 0.9 % irrigation (POUR BTL), DISCONTD: hemostatic agents, DISCONTD:  HYDROmorphone (DILAUDID) injection, DISCONTD: ipratropium-albuterol, DISCONTD: ondansetron (ZOFRAN) IV DISCONTD: thrombin   Objective: Weight change:   Intake/Output Summary (Last 24 hours) at 07/24/11 1655 Last data filed at 07/24/11 1209  Gross per 24 hour  Intake    500 ml  Output     10 ml  Net    490 ml   Blood pressure 169/99, pulse 64, temperature 98.7 F (37.1 C), temperature source Oral, resp. rate 20, height 5' 11.5" (1.816 m), weight 209 lb (94.802 kg), SpO2 96.00%. Temp:  [97 F (36.1 C)-98.7 F (37.1 C)] 98.7 F (37.1 C) (05/16 1415) Pulse Rate:  [35-95] 64  (05/16 1415) Resp:  [18-27] 20  (05/16 1415) BP:  (150-169)/(93-99) 169/99 mmHg (05/16 1415) SpO2:  [77 %-100 %] 96 % (05/16 1415) Weight:  [209 lb (94.802 kg)] 209 lb (94.802 kg) (05/16 2956)  Physical Exam: General: Alert and awake HEENT: Bandage is present on scalp and neck CVS regular rate, normal r,  no murmur rubs or gallops Chest: clear to auscultation bilaterally, no wheezing, rales or rhonchi Abdomen: soft nontender, nondistended, normal bowel sounds, Extremities: no  clubbing or edema noted bilaterally Skin: no rashes Lymph: no new lymphadenopathy Neuro: nonfocal  Lab Results:  Basename 07/24/11 1431 07/24/11 0801  WBC 10.7* 10.4  HGB 11.2* 11.5*  HCT 34.0* 34.4*  PLT 230 258    BMET  Basename 07/24/11 0759  NA 138  K 4.5  CL 105  CO2 22  GLUCOSE 93  BUN 25*  CREATININE 1.47*  CALCIUM 9.2    Micro Results: Recent Results (from the past 240 hour(s))  SURGICAL PCR SCREEN     Status: Normal   Collection Time   07/24/11  7:30 AM      Component Value Range Status Comment   MRSA, PCR NEGATIVE  NEGATIVE  Final    Staphylococcus aureus NEGATIVE  NEGATIVE  Final     Studies/Results: Dg Chest Port 1 View  07/24/2011  *RADIOLOGY REPORT*  Clinical Data: PICC line placement.  PORTABLE CHEST - 1 VIEW  Comparison: 05/21/2011  Findings: Right PICC line tip is in the upper right arm, likely short of the axillary vein.  Bibasilar atelectasis or scarring.  Heart is borderline in size. No effusions or acute bony abnormality.  Degenerative changes in the shoulders.  IMPRESSION: Right PICC line tip is in the upper right arm.  Bibasilar atelectasis or scarring.  Original Report Authenticated By: Cyndie Chime, M.D.      Assessment/Plan: [name] is a 78 y.o. male with severe parkinsonism that has been treated with a deep brain stimulator. The deep brain stimulator pocket became infected and has been managed by Dr. Venetia Maxon as well as my partner Dr. Merceda Elks. Patient has had a coagulase-negative staph isolated from wound  culture in the past but otherwise no other obvious pathogens and isolated on culture. He has been managed initially with vancomycin to which he developed a diffuse rash. He was then changed over to IV teflaro. Unfortunately despite aggressive management with antibiotics and incision and debridement of the pocket patient developed purulent drainage from his neck where we'll one of the  leads was coursing through. He is now status post removal of the entire deep brain stimulator including leads into the central nervous system. Intraoperative cultures are still pending. Unfortunately the patient is having a great deal of trouble affording the intravenous antibiotics at and has had to stay in a skilled nursing facility which both he and his family do not like. We are asked to the patient to assist with the management and workup of his deep brain stimulator infection.  #1 deep brain similar infection: The device has been removed intraoperative cultures are pending. He did grow coagulase-negative staphylococcal species in the past.  we would consider changing him to oral Zyvox which would broadly cover for methicillin-resistant Staphylococcus aureus as well as a coagulase negative staph species and which he could take by mouth. This drug however is very expensive itself. There is a patient assistance program and this could be accessed eye case management to see if the cost could be less prohibitive. Alternatively one could go with doxycycline and which would also cover his coagulase-negative staph as well as MRSA and methicillin sensitive staph aureus. It would be less active and affect efficacious against streptococcal species. For now I will go ahead and make the change to Zyvox and put in a consult to case management to see if they can obtain assistance for this antibiotic for this patient. I would prefer to treat him for another month to 6 weeks after removal of the deep brain stimulator to ensure eradication of the  infection.   "zyvox assist program" phone number [phone]   LOS: 0 days   Acey Lav 07/24/2011, 4:55 PM

## 2011-07-24 NOTE — Anesthesia Preprocedure Evaluation (Addendum)
Anesthesia Evaluation  Patient identified by MRN, date of birth, ID band Patient awake    Reviewed: Allergy & Precautions, H&P , NPO status , Patient's Chart, lab work & pertinent test results, reviewed documented beta blocker date and time   Airway Mallampati: II TM Distance: >3 FB Neck ROM: Full    Dental  (+) Dental Advisory Given and Teeth Intact   Pulmonary shortness of breath,  breath sounds clear to auscultation        Cardiovascular hypertension, Pt. on medications + CAD and + Cardiac Stents + dysrhythmias Atrial Fibrillation Rhythm:Regular Rate:Normal     Neuro/Psych parkinsons  Neuromuscular disease    GI/Hepatic PUD, GERD-  Medicated,  Endo/Other    Renal/GU      Musculoskeletal   Abdominal   Peds  Hematology   Anesthesia Other Findings   Reproductive/Obstetrics                          Anesthesia Physical Anesthesia Plan  ASA: III  Anesthesia Plan: General   Post-op Pain Management:    Induction: Intravenous  Airway Management Planned: LMA  Additional Equipment:   Intra-op Plan:   Post-operative Plan:   Informed Consent: I have reviewed the patients History and Physical, chart, labs and discussed the procedure including the risks, benefits and alternatives for the proposed anesthesia with the patient or authorized representative who has indicated his/her understanding and acceptance.   Dental advisory given  Plan Discussed with: CRNA and Surgeon  Anesthesia Plan Comments: (Parkinsonism with deep brain stimulator since 2002. Now infected, needs removal CAD S/P PTCA 2005, no angina, EF 55-60% by echo 03/24/11  GERD S/P forefoot amputation   Plan GA with LMA  Kipp Brood, MD)       Anesthesia Quick Evaluation

## 2011-07-24 NOTE — Progress Notes (Signed)
PICC line removed per MD order. Site is red with slight swelling to site.  Cath length 40 cm, vaseline pressure gauze to site, pressure held x 5 min.  PIV started in right posterior forearm. RN notified. Consuello Masse

## 2011-07-25 ENCOUNTER — Encounter (HOSPITAL_COMMUNITY): Payer: Self-pay | Admitting: Neurosurgery

## 2011-07-25 NOTE — Progress Notes (Signed)
Brief Nutrition Note: Chart reviewed. Pt identified on nutrition risk report for dysphagia. Pt denies problems eating/drinking. Pt denies wt loss. Reports good appetite. Ate 100% of his Regular diet this morning.  No nutrition issues identified at this time. Please re-consult if needed. Kendell Bane Cornelison [phone]

## 2011-07-25 NOTE — Progress Notes (Signed)
Occupational Therapy Evaluation Patient Details Name: [name] MRN: [id number] DOB: May 28, 1929 Today's Date: 07/25/2011 Time: 4098-1191 OT Time Calculation (min): 45 min  OT Assessment / Plan / Recommendation Clinical Impression    Pt presents s/p subthalamic stimulator removal. Will benefit from acute OT to address below problem list.  Pt very motivated and would be an excellent candidate for CIR.      OT Assessment  Patient needs continued OT Services    Follow Up Recommendations  Inpatient Rehab    Barriers to Discharge      Equipment Recommendations  None recommended by PT;Defer to next venue    Recommendations for Other Services Rehab consult  Frequency  Min 2X/week    Precautions / Restrictions Precautions Precautions: Fall Restrictions Weight Bearing Restrictions: No   Pertinent Vitals/Pain N/A    ADL  Eating/Feeding: Performed;Modified independent Where Assessed - Eating/Feeding: Edge of bed Toilet Transfer: Maximal assistance;Simulated Toilet Transfer Method: Other (comment) (ambulating) Toilet Transfer Equipment: Other (comment) (recliner) Toileting - Clothing Manipulation and Hygiene: Performed;Maximal assistance Where Assessed - Toileting Clothing Manipulation and Hygiene: Sit to stand from 3-in-1 or toilet Equipment Used: Rolling walker;Gait belt Transfers/Ambulation Related to ADLs: Pt required mod-max assist with sit<>stand and ambulated in hallway with min assist and RW.  Pt requires verbal and visual cueing to emphasize taking big steps. ADL Comments: Max assist with clothing manipulation to thread LE's through undergarment and to pull undergarment up over hips.  Pt able to stand with mod assist and bil. UE holding urinal in place while voiding (min assist to steady hands).  Pt supporting LE's posteriorly against chair while standing to void.    OT Diagnosis: Generalized weakness;Acute pain  OT Problem List: Decreased strength;Decreased activity  tolerance;Impaired balance (sitting and/or standing);Decreased coordination;Decreased knowledge of use of DME or AE;Pain OT Treatment Interventions: Self-care/ADL training;Therapeutic activities;Balance training;Patient/family education   OT Goals Acute Rehab OT Goals OT Goal Formulation: With patient Time For Goal Achievement: 08/08/11 Potential to Achieve Goals: Good ADL Goals Pt Will Perform Upper Body Bathing: with supervision;Sitting, chair;Sitting at sink;Sitting, edge of bed;Unsupported ADL Goal: Upper Body Bathing - Progress: Goal set today Pt Will Perform Lower Body Bathing: with supervision;Sit to stand from chair;Sit to stand from bed ADL Goal: Lower Body Bathing - Progress: Goal set today Additional ADL Goal #1: Pt will perform 3/3 toileting tasks with supervision. ADL Goal: Additional Goal #1 - Progress: Goal set today Miscellaneous OT Goals Miscellaneous OT Goal #1: Pt will perform sit to stand from chair with supervision as precursor for functional mobility. OT Goal: Miscellaneous Goal #1 - Progress: Goal set today  Visit Information  Last OT Received On: 07/25/11 Assistance Needed: +2 PT/OT Co-Evaluation/Treatment: Yes    Subjective Data  Subjective: I need to start walking. Patient Stated Goal: To go home.   Prior Functioning  Home Living Lives With: Spouse (SNF past two weeks) Available Help at Discharge: Available 24 hours/day;Available PRN/intermittently;Family (wife can provide min assist 24/7, daughter PRN) Type of Home: Apartment Home Access: Level entry Home Layout: One level Bathroom Shower/Tub: Tub/shower unit;Curtain Bathroom Toilet: Handicapped height Bathroom Accessibility: Yes How Accessible: Accessible via walker;Accessible via wheelchair Home Adaptive Equipment: Wheelchair - manual;Walker - rolling;Tub transfer bench;Bedside commode/3-in-1 Prior Function Level of Independence: Needs assistance Needs Assistance: Dressing Dressing: Minimal  (shoes and socks) Communication Communication: No difficulties Dominant Hand: Right    Cognition  Overall Cognitive Status: Appears within functional limits for tasks assessed/performed Arousal/Alertness: Awake/alert Orientation Level: Appears intact for tasks assessed Behavior  During Session: [facility] for tasks performed    Extremity/Trunk Assessment Right Upper Extremity Assessment RUE ROM/Strength/Tone: Within functional levels RUE Sensation: WFL - Light Touch;WFL - Proprioception RUE Coordination: Deficits RUE Coordination Deficits: Tremors due to Parkinson's Left Upper Extremity Assessment LUE ROM/Strength/Tone: Within functional levels LUE Sensation: WFL - Light Touch;WFL - Proprioception LUE Coordination: Deficits LUE Coordination Deficits: Tremors due to Parkinson's Right Lower Extremity Assessment RLE ROM/Strength/Tone: Within functional levels RLE Sensation: WFL - Light Touch Left Lower Extremity Assessment LLE ROM/Strength/Tone: Within functional levels LLE Sensation: WFL - Light Touch Trunk Assessment Trunk Assessment: Lordotic   Mobility Bed Mobility Bed Mobility: Supine to Sit;Sitting - Scoot to Edge of Bed Supine to Sit: 5: Supervision Sitting - Scoot to Edge of Bed: 5: Supervision Details for Bed Mobility Assistance: VC for sequencing. No physical assist needed Transfers Sit to Stand: 3: Mod assist;With upper extremity assist;From chair/3-in-1;From bed Stand to Sit: 3: Mod assist;2: Max assist;With upper extremity assist;To chair/3-in-1 Details for Transfer Assistance: VC for anterior translation. Pt reaching forward holding onto RW to emphasize forward translation. Mod-max assist to control descent with emphasis on anterior translation for a controlled sit.    Exercise    Balance    End of Session OT - End of Session Equipment Utilized During Treatment: Gait belt Activity Tolerance: Patient tolerated treatment well Patient left: in chair;with call bell/phone  within reach Nurse Communication: Mobility status  07/25/2011 Cipriano Mile OTR/L Pager [phone] Office [phone]  Torryn, Hudspeth 07/25/2011, 4:36 PM

## 2011-07-25 NOTE — Progress Notes (Signed)
Subjective: No new complaints   Antibiotics:  Anti-infectives     Start     Dose/Rate Route Frequency Ordered Stop   07/24/11 2200   ceftaroline (TEFLARO) 600 mg in sodium chloride 0.9 % 250 mL IVPB  Status:  Discontinued        600 mg 250 mL/hr over 60 Minutes Intravenous Every 12 hours 07/24/11 1604 07/24/11 1702   07/24/11 2200   linezolid (ZYVOX) tablet 600 mg        600 mg Oral Every 12 hours 07/24/11 1702     07/24/11 1800   ceftaroline (TEFLARO) injection 600 mg  Status:  Discontinued        600 mg Intravenous Every 12 hours 07/24/11 1430 07/24/11 1504   07/24/11 1800   ceftaroline (TEFLARO) 600 mg in sodium chloride 0.9 % 250 mL IVPB  Status:  Discontinued        600 mg 250 mL/hr over 60 Minutes Intravenous Every 12 hours 07/24/11 1504 07/24/11 1702   07/24/11 1445   ceFAZolin (ANCEF) IVPB 1 g/50 mL premix  Status:  Discontinued        1 g 100 mL/hr over 30 Minutes Intravenous Every 8 hours 07/24/11 1430 07/24/11 1501   07/24/11 1200   DAPTOmycin (CUBICIN) 570 mg in sodium chloride 0.9 % IVPB  Status:  Discontinued        570 mg 222.8 mL/hr over 30 Minutes Intravenous To Surgery 07/24/11 1158 07/24/11 1415   07/24/11 1030   bacitracin 16109 UNITS injection     Comments: [name], [name]: cabinet override         07/24/11 1030 07/24/11 2229   07/23/11 1423   ceFAZolin (ANCEF) IVPB 2 g/50 mL premix        2 g 100 mL/hr over 30 Minutes Intravenous 60 min pre-op 07/23/11 1423 07/24/11 1132          Medications: Scheduled Meds:    . amantadine  100 mg Oral BID  . aspirin EC  81 mg Oral Daily  . B-complex with vitamin C  1 tablet Oral Daily  . bacitracin      . carbidopa-levodopa  1 tablet Oral Once  . carbidopa-levodopa  1 tablet Oral Custom  . carbidopa-levodopa  2 tablet Oral Once  . carbidopa-levodopa  2 tablet Oral Q24H  .  ceFAZolin (ANCEF) IV  2 g Intravenous 60 min Pre-Op  . dextromethorphan-guaiFENesin  1 tablet Oral Q12H  . docusate sodium  100 mg  Oral BID  . Fluticasone-Salmeterol  1 puff Inhalation Q12H  . linezolid  600 mg Oral Q12H  . meloxicam  15 mg Oral Daily  . midodrine  5 mg Oral BID WC  . mupirocin ointment   Nasal BID  . pantoprazole  40 mg Oral Q1200  . pramipexole  0.5 mg Oral Custom  . pramipexole  0.5 mg Oral Custom  . pramipexole  1 mg Oral Q24H  . pyridOXINE  50 mg Oral Daily  . sodium chloride      . sodium chloride  3 mL Intravenous Q12H  . DISCONTD: b complex vitamins  1 tablet Oral Daily  . DISCONTD: carbidopa-levodopa  0.5 tablet Oral TID  . DISCONTD: carbidopa-levodopa  1 tablet Oral Q12H  . DISCONTD:  ceFAZolin (ANCEF) IV  1 g Intravenous Q8H  . DISCONTD: ceFTAROline (TEFLARO) IV  600 mg Intravenous Q12H  . DISCONTD: ceFTAROline (TEFLARO) IV  600 mg Intravenous Q12H  . DISCONTD: ceftaroline  600 mg Intravenous Q12H  .  DISCONTD: DAPTOmycin (CUBICIN)  IV  570 mg Intravenous To OR  . DISCONTD: PARoxetine  10 mg Oral Daily  . DISCONTD: PARoxetine  20 mg Oral Daily  . DISCONTD: pramipexole  0.5 mg Oral Daily  . DISCONTD: pramipexole  0.5 mg Oral Q24H  . DISCONTD: pramipexole  1 mg Oral Q1200   Continuous Infusions:    . sodium chloride    . dextrose 5 % and 0.45 % NaCl with KCl 20 mEq/L 75 mL/hr at 07/25/11 0630   PRN Meds:.acetaminophen, acetaminophen, albuterol, fluticasone, HYDROcodone-acetaminophen, HYDROcodone-acetaminophen, ipratropium, menthol-cetylpyridinium, ondansetron (ZOFRAN) IV, oxyCODONE-acetaminophen, phenol, polyethylene glycol, sodium chloride, traMADol, DISCONTD: 0.9 % irrigation (POUR BTL), DISCONTD: hemostatic agents, DISCONTD:  HYDROmorphone (DILAUDID) injection, DISCONTD: ipratropium-albuterol, DISCONTD: ondansetron (ZOFRAN) IV DISCONTD: thrombin   Objective: Weight change:   Intake/Output Summary (Last 24 hours) at 07/25/11 1126 Last data filed at 07/25/11 1100  Gross per 24 hour  Intake   1083 ml  Output    860 ml  Net    223 ml   Blood pressure 160/76, pulse 70,  temperature 97.2 F (36.2 C), temperature source Oral, resp. rate 20, height 5' 11.5" (1.816 m), weight 209 lb (94.802 kg), SpO2 96.00%. Temp:  [97 F (36.1 C)-98.7 F (37.1 C)] 97.2 F (36.2 C) (05/17 1107) Pulse Rate:  [35-95] 70  (05/17 1107) Resp:  [16-27] 20  (05/17 1107) BP: (110-169)/(71-99) 160/76 mmHg (05/17 1107) SpO2:  [77 %-100 %] 96 % (05/17 1107)  Physical Exam: General: Alert and awake HEENT: Bandage is present on scalp and neck CVS regular rate, normal r,  no murmur rubs or gallops Chest: clear to auscultation bilaterally, no wheezing, rales or rhonchi Abdomen: soft nontender, nondistended, normal bowel sounds, Extremities: no  clubbing or edema noted bilaterally Skin: no rashes Lymph: no new lymphadenopathy Neuro: nonfocal  Lab Results:  Basename 07/24/11 1431 07/24/11 0801  WBC 10.7* 10.4  HGB 11.2* 11.5*  HCT 34.0* 34.4*  PLT 230 258    BMET  Basename 07/24/11 0759  NA 138  K 4.5  CL 105  CO2 22  GLUCOSE 93  BUN 25*  CREATININE 1.47*  CALCIUM 9.2    Micro Results: Recent Results (from the past 240 hour(s))  SURGICAL PCR SCREEN     Status: Normal   Collection Time   07/24/11  7:30 AM      Component Value Range Status Comment   MRSA, PCR NEGATIVE  NEGATIVE  Final    Staphylococcus aureus NEGATIVE  NEGATIVE  Final   WOUND CULTURE     Status: Normal (Preliminary result)   Collection Time   07/24/11 12:07 PM      Component Value Range Status Comment   Specimen Description WOUND LEFT NECK   Final    Special Requests BEHIND LEFT EAR   Final    Gram Stain PENDING   Incomplete    Culture NO GROWTH 1 DAY   Final    Report Status PENDING   Incomplete   ANAEROBIC CULTURE     Status: Normal (Preliminary result)   Collection Time   07/24/11 12:07 PM      Component Value Range Status Comment   Specimen Description WOUND LEFT NECK   Final    Special Requests BEHIND LEFT EAR   Final    Gram Stain     Final    Value: NO WBC SEEN     FEW SQUAMOUS  EPITHELIAL CELLS PRESENT     FEW GRAM POSITIVE COCCI IN PAIRS  Culture PENDING   Incomplete    Report Status PENDING   Incomplete   WOUND CULTURE     Status: Normal (Preliminary result)   Collection Time   07/24/11 12:18 PM      Component Value Range Status Comment   Specimen Description WOUND   Final    Special Requests     Final    Value: DEEP BRAIN STIMULATOR LEAD FROM LEFT SIDE BRAIN TO ABDOMEN   Gram Stain     Final    Value: FEW WBC PRESENT,BOTH PMN AND MONONUCLEAR     NO SQUAMOUS EPITHELIAL CELLS SEEN     RARE GRAM POSITIVE COCCI IN PAIRS   Culture NO GROWTH 1 DAY   Final    Report Status PENDING   Incomplete     Studies/Results: Dg Chest Port 1 View  07/24/2011  *RADIOLOGY REPORT*  Clinical Data: PICC line placement.  PORTABLE CHEST - 1 VIEW  Comparison: 05/21/2011  Findings: Right PICC line tip is in the upper right arm, likely short of the axillary vein.  Bibasilar atelectasis or scarring.  Heart is borderline in size. No effusions or acute bony abnormality.  Degenerative changes in the shoulders.  IMPRESSION: Right PICC line tip is in the upper right arm.  Bibasilar atelectasis or scarring.  Original Report Authenticated By: Cyndie Chime, M.D.      Assessment/Plan: [name] is a 78 y.o. male with severe parkinsonism that has been treated with a deep brain stimulator. The deep brain stimulator pocket became infected and has been managed by Dr. Venetia Maxon as well as my partner Dr. Merceda Elks. Patient has had a coagulase-negative staph isolated from wound culture in the past but otherwise no other obvious pathogens and isolated on culture. He NOW has GPCC on gram stain from OR. He has been managed initially with vancomycin to which he developed a diffuse rash. He was then changed over to IV teflaro. Unfortunately despite aggressive management with antibiotics and incision and debridement of the pocket patient developed purulent drainage from his neck where we'll one of the leads  was coursing through. He is now status post removal of the entire deep brain stimulator including leads into the central nervous system. Intraoperative cultures are still pending. Unfortunately the patient is having a great deal of trouble affording the intravenous antibiotics at and has had to stay in a skilled nursing facility which both he and his family do not like. We are asked to the patient to assist with the management and workup of his deep brain stimulator infection.  #1 deep brain similar infection: The device has been removed intraoperative cultures are pending. He did grow coagulase-negative staphylococcal species in the past. and  We see GPCC in GS. I  Am changin him  to oral Zyvox which would broadly cover for methicillin-resistant Staphylococcus aureus as well as a coagulase negative staph species and which he could take by mouth. This drug however is very expensive itself. There is a patient assistance program and this could be accessed eye case management to see if the cost could be less prohibitive. Alternatively one could go with doxycycline and which would also cover his coagulase-negative staph as well as MRSA and methicillin sensitive staph aureus. It would be less active and affect efficacious against streptococcal species. For now I will go ahead and make the change to Zyvox and put in a consult to case management to see if they can obtain assistance for this antibiotic for this patient.  I would prefer to treat him for another month to 6 weeks after removal of the deep brain stimulator to ensure eradication of the infection.   "zyvox assist program" phone number [phone]  Dr. Drue Second will be covering this weekend and is available for questions.    LOS: 1 day   Acey Lav 07/25/2011, 11:26 AM

## 2011-07-25 NOTE — Progress Notes (Signed)
Subjective: Patient reports "I feel pretty good; just a little headache."  Objective: Vital signs in last 24 hours: Temp:  [97 F (36.1 C)-98.7 F (37.1 C)] 97.5 F (36.4 C) (05/17 0625) Pulse Rate:  [35-95] 82  (05/17 0625) Resp:  [16-27] 18  (05/17 0625) BP: (110-169)/(71-99) 158/94 mmHg (05/17 0625) SpO2:  [77 %-100 %] 95 % (05/17 0625) Weight:  [94.802 kg (209 lb)] 94.802 kg (209 lb) (05/16 0808)  Intake/Output from previous day: 05/16 0701 - 05/17 0700 In: 623 [P.O.:120; I.V.:503] Out: 610 [Urine:600; Blood:10] Intake/Output this shift:    Alert, conversant. Incisions covered with dressings, intact without change in bloody drainage (on drsg) since yesterday. Pt verbalizes understanding of plan to work with ID on antibiotics & monitor Parkinsons s/s for possible med adjustments in the coming days.   Lab Results:  Basename 07/24/11 1431 07/24/11 0801  WBC 10.7* 10.4  HGB 11.2* 11.5*  HCT 34.0* 34.4*  PLT 230 258   BMET  Basename 07/24/11 0759  NA 138  K 4.5  CL 105  CO2 22  GLUCOSE 93  BUN 25*  CREATININE 1.47*  CALCIUM 9.2    Studies/Results: Dg Chest Port 1 View  07/24/2011  *RADIOLOGY REPORT*  Clinical Data: PICC line placement.  PORTABLE CHEST - 1 VIEW  Comparison: 05/21/2011  Findings: Right PICC line tip is in the upper right arm, likely short of the axillary vein.  Bibasilar atelectasis or scarring.  Heart is borderline in size. No effusions or acute bony abnormality.  Degenerative changes in the shoulders.  IMPRESSION: Right PICC line tip is in the upper right arm.  Bibasilar atelectasis or scarring.  Original Report Authenticated By: Cyndie Chime, M.D.    Assessment/Plan: Stable neurologically.  LOS: 1 day  Thank you to Infectious Disease for your assistance. Hopeful of improved SNF situation/options in coming days. Ok to change dressings prn: saline wipes to cleanse gently, then Telfa to sites.    Georgiann Cocker 07/25/2011, 8:07 AM

## 2011-07-25 NOTE — Evaluation (Signed)
Physical Therapy Evaluation Patient Details Name: [name] MRN: [id number] DOB: Jul 08, 1929 Today's Date: 07/25/2011 Time: 1191-4782 PT Time Calculation (min): 45 min  PT Assessment / Plan / Recommendation Clinical Impression  Pt presents s/p subthalamic stimulator removal. Pt with Parkinson's gift and stiffness. Pt required cueing throughout treatment for anterior translation and larger steps during gait. Pt would make a great candidate for CIR to increase functional mobility and safety to return to PLOF.    PT Assessment  Patient needs continued PT services    Follow Up Recommendations  Supervision/Assistance - 24 hour;Inpatient Rehab    Barriers to Discharge        lEquipment Recommendations  None recommended by PT    Recommendations for Other Services Rehab consult   Frequency Min 4X/week    Precautions / Restrictions Precautions Precautions: Fall Restrictions Weight Bearing Restrictions: No         Mobility  Bed Mobility Bed Mobility: Supine to Sit;Sitting - Scoot to Edge of Bed Supine to Sit: 5: Supervision Sitting - Scoot to Edge of Bed: 5: Supervision Details for Bed Mobility Assistance: VC for sequencing. No physical assist needed Transfers Transfers: Sit to Stand;Stand to Sit Sit to Stand: 3: Mod assist;With upper extremity assist;From chair/3-in-1;From bed Stand to Sit: 3: Mod assist;2: Max assist;With upper extremity assist;To chair/3-in-1 Details for Transfer Assistance: VC for anterior translation. Pt reaching forward holding onto RW to emphasize forward translation. Mod-max assist to control descent with emphasis on anterior translation for a controlled sit.  Ambulation/Gait Ambulation/Gait Assistance: 4: Min assist Ambulation Distance (Feet): 100 Feet Assistive device: Rolling walker Ambulation/Gait Assistance Details: Min assist for stability as pt with posterior pelvic tilt. Max verbal cues for increased step length. Shuffling when not given  cueing. Gait Pattern: Shuffle;Trunk flexed;Narrow base of support;Decreased hip/knee flexion - right;Decreased hip/knee flexion - left;Step-to pattern Gait velocity: decreased gait speed    Exercises     PT Diagnosis: Abnormality of gait;Acute pain  PT Problem List: Decreased activity tolerance;Decreased balance;Decreased mobility;Decreased knowledge of use of DME;Decreased knowledge of precautions PT Treatment Interventions: DME instruction;Gait training;Functional mobility training;Therapeutic activities;Therapeutic exercise;Balance training;Neuromuscular re-education;Patient/family education   PT Goals Acute Rehab PT Goals PT Goal Formulation: With patient Time For Goal Achievement: 08/01/11 Potential to Achieve Goals: Good Pt will go Supine/Side to Sit: with modified independence PT Goal: Supine/Side to Sit - Progress: Goal set today Pt will go Sit to Supine/Side: with modified independence PT Goal: Sit to Supine/Side - Progress: Goal set today Pt will go Sit to Stand: with supervision PT Goal: Sit to Stand - Progress: Goal set today Pt will go Stand to Sit: with supervision PT Goal: Stand to Sit - Progress: Goal set today Pt will Transfer Bed to Chair/Chair to Bed: with supervision PT Transfer Goal: Bed to Chair/Chair to Bed - Progress: Goal set today Pt will Ambulate: 51 - 150 feet;with supervision;with least restrictive assistive device PT Goal: Ambulate - Progress: Goal set today  Visit Information  Last PT Received On: 07/25/11 Assistance Needed: +2 PT/OT Co-Evaluation/Treatment: Yes    Subjective Data      Prior Functioning  Home Living Lives With: Spouse (SNF past two weeks) Available Help at Discharge: Available 24 hours/day;Available PRN/intermittently;Family (wife can provide min assist 24/7, daughter PRN) Type of Home: Apartment Home Access: Level entry Home Layout: One level Bathroom Shower/Tub: Tub/shower unit;Curtain Bathroom Toilet: Handicapped  height Bathroom Accessibility: Yes How Accessible: Accessible via walker;Accessible via wheelchair Home Adaptive Equipment: Wheelchair - manual;Walker - rolling;Tub transfer bench;Bedside  commode/3-in-1 Prior Function Level of Independence: Needs assistance Needs Assistance: Dressing Dressing: Minimal Communication Communication: No difficulties Dominant Hand: Right    Cognition  Overall Cognitive Status: Appears within functional limits for tasks assessed/performed Arousal/Alertness: Awake/alert Orientation Level: Appears intact for tasks assessed Behavior During Session: [facility] for tasks performed    Extremity/Trunk Assessment Right Lower Extremity Assessment RLE ROM/Strength/Tone: Within functional levels RLE Sensation: WFL - Light Touch Left Lower Extremity Assessment LLE ROM/Strength/Tone: Within functional levels LLE Sensation: WFL - Light Touch Trunk Assessment Trunk Assessment: Lordotic   Balance    End of Session PT - End of Session Equipment Utilized During Treatment: Gait belt Activity Tolerance: Patient tolerated treatment well Patient left: in chair;with call bell/phone within reach Nurse Communication: Mobility status   Milana Kidney 07/25/2011, 3:52 PM  07/25/2011 Milana Kidney DPT PAGER: [phone] OFFICE: [phone]

## 2011-07-25 NOTE — Progress Notes (Signed)
Utilization review completed. Rhea Kaelin, RN, BSN.  07/25/11   

## 2011-07-25 NOTE — Progress Notes (Signed)
Pt BP 194/101 by dinamap. Manual recheck 182/100 HR 90. MD notified no orders given.

## 2011-07-26 LAB — WOUND CULTURE: Culture: NO GROWTH

## 2011-07-26 NOTE — Progress Notes (Signed)
Patient BP 198/112 HR 103. Pt asymptomatic. MD paged, no orders given.   Minor, [name]

## 2011-07-26 NOTE — Progress Notes (Signed)
CSW consulted for SNF. Pt admitted from [facility] but requesting new SNF v. CIR. PT/OT report pt excellent CIR candidate. Will follow along and pursue new SNF if needed. Dellie Burns, MSW, [place] [phone] (weekend)

## 2011-07-26 NOTE — Progress Notes (Signed)
Physical Therapy Treatment Patient Details Name: [name] MRN: [id number] DOB: 08/02/1929 Today's Date: 07/26/2011 Time: 4098-1191 PT Time Calculation (min): 19 min  PT Assessment / Plan / Recommendation Comments on Treatment Session  Pts treatment limited today due to hypotension. RN in room throughout session and aware of BP changes. Pt educated on importance of sitting up in chair to imrpove BP changes. Will continue per plan.    Follow Up Recommendations  Supervision/Assistance - 24 hour;Inpatient Rehab    Barriers to Discharge        Equipment Recommendations  None recommended by PT;Defer to next venue    Recommendations for Other Services Rehab consult  Frequency Min 4X/week   Plan Discharge plan remains appropriate;Frequency remains appropriate    Precautions / Restrictions Precautions Precautions: Fall Restrictions Weight Bearing Restrictions: No   Pertinent Vitals/Pain BPs: Supine- 137/89 Sitting at EOB- 96/67 Unable to obtain in standing Sitting in chair- 131/82    Mobility  Bed Mobility Bed Mobility: Supine to Sit;Sitting - Scoot to Edge of Bed Supine to Sit: 4: Min assist;With rails;HOB elevated Sitting - Scoot to Edge of Bed: 5: Supervision Details for Bed Mobility Assistance: VC for hand placement. Pt required assist today with trunk for anterior translation as well as with RLE due to increased stiffness Transfers Transfers: Sit to Stand;Stand to Sit Sit to Stand: 3: Mod assist;With upper extremity assist;From bed Stand to Sit: 3: Mod assist;With upper extremity assist;To chair/3-in-1 Stand Pivot Transfers: 3: Mod assist;With armrests Details for Transfer Assistance: VC for hand placement and anterior translation. Pt required 2x to stand due to anterior translation difficulties. Upon standing, pt did not feel well and sat back down. BP taken. Pt requested still to get to a chair, RN aware. BP retaken in chair, stabilized. See vitals for  BP's Ambulation/Gait Ambulation/Gait Assistance: Not tested (comment) (due to BP)    Exercises     PT Diagnosis:    PT Problem List:   PT Treatment Interventions:     PT Goals Acute Rehab PT Goals PT Goal Formulation: With patient PT Goal: Supine/Side to Sit - Progress: Progressing toward goal PT Goal: Sit to Supine/Side - Progress: Progressing toward goal PT Goal: Sit to Stand - Progress: Progressing toward goal PT Goal: Stand to Sit - Progress: Progressing toward goal PT Transfer Goal: Bed to Chair/Chair to Bed - Progress: Progressing toward goal  Visit Information  Last PT Received On: 07/26/11 Assistance Needed: +2    Subjective Data      Cognition  Overall Cognitive Status: Appears within functional limits for tasks assessed/performed Arousal/Alertness: Awake/alert Orientation Level: Appears intact for tasks assessed Behavior During Session: [facility] for tasks performed    Balance     End of Session PT - End of Session Equipment Utilized During Treatment: Gait belt Activity Tolerance: Treatment limited secondary to medical complications (Comment) (BP) Patient left: in chair;with call bell/phone within reach;with family/visitor present Nurse Communication: Mobility status;Other (comment) (BP)    Milana Kidney 07/26/2011, 3:26 PM  07/26/2011 Milana Kidney DPT PAGER: [phone] OFFICE: [phone]

## 2011-07-26 NOTE — Progress Notes (Signed)
Postop day 2. Patient afebrile. Complains of moderate headache. No new neurologic symptoms.  Patient awake alert oriented and appropriate. Motor and sensory function stable. Dressings clean and dry.  Status post infected deep brain stimulator with subsequent removal. Patient with resistant gram-positive infection cultures pending. Zyvox recently started for antibiotic coverage. Continue current management.

## 2011-07-27 NOTE — Progress Notes (Addendum)
Patient with BP of 203/106, asymptomatic. Looking at vitals trend, it appears that patient's BP seems to jump very high each afternoon. MD on call paged, no orders at this time. However staff with recheck BP within the hour.  Minor, [name]   At 1845 BP rechecked, now 160/85. Will cont to monitor.  Minor, [name]

## 2011-07-27 NOTE — Progress Notes (Signed)
Physical Therapy Treatment Patient Details Name: [name] MRN: [id number] DOB: 1929-04-01 Today's Date: 07/27/2011 Time: 7829-5621 PT Time Calculation (min): 47 min  PT Assessment / Plan / Recommendation Comments on Treatment Session  Rx limited by hypotension with sitting/standing.  Pt very motivated to participate and begin gait training.    Follow Up Recommendations  Supervision/Assistance - 24 hour;Inpatient Rehab    Barriers to Discharge        Equipment Recommendations  Defer to next venue    Recommendations for Other Services Rehab consult  Frequency Min 4X/week   Plan Discharge plan remains appropriate;Frequency remains appropriate    Precautions / Restrictions Precautions Precautions: Fall Restrictions Weight Bearing Restrictions: No   Pertinent Vitals/Pain Pt reported feeling dizzy sitting EOB. Attempted to take BP but unable to get a reading with dynamap. Pt reported dizziness had subsided after sitting EOB ~10 min. After stand pivot to chair but reported dizziness had returned. Able to get BP with pt in chair which read 85/57. RN notified and requested pt return to bed. Once pt in supine in bed, BP read 84/61 and then 104/63 ~5 min later.      Mobility  Bed Mobility Bed Mobility: Sit to Supine Supine to Sit: 4: Min assist;With rails Sitting - Scoot to Edge of Bed: 5: Supervision Sit to Supine: 1: +2 Total assist Sit to Supine: Patient Percentage: 50% Details for Bed Mobility Assistance: Increased assist for sit to supine due to hypotension Transfers Sit to Stand: 1: +2 Total assist;With upper extremity assist;From bed;From chair/3-in-1 Sit to Stand: Patient Percentage: 60% Stand to Sit: 1: +2 Total assist;To bed;To chair/3-in-1;With upper extremity assist Stand to Sit: Patient Percentage: 60% Stand Pivot Transfers: 3: Mod assist Details for Transfer Assistance: Sit <> stand 3x.  RW placed to pt's left side, and black glass case placed 3 feet in front of pt  for sit to stand to assist with clear visual field and visual cueing for anterior translation during stance.  RW placed in front of pt once standing.  Pt retropulsive with initial stance.  Rw used for SPT. Ambulation/Gait Ambulation/Gait Assistance Details: Unable to ambulate due to hypotension.    Exercises     PT Diagnosis:    PT Problem List:   PT Treatment Interventions:     PT Goals Acute Rehab PT Goals PT Goal: Supine/Side to Sit - Progress: Progressing toward goal PT Goal: Sit to Supine/Side - Progress: Progressing toward goal PT Goal: Sit to Stand - Progress: Progressing toward goal PT Goal: Stand to Sit - Progress: Progressing toward goal PT Transfer Goal: Bed to Chair/Chair to Bed - Progress: Progressing toward goal PT Goal: Ambulate - Progress: Progressing toward goal  Visit Information  Last PT Received On: 07/27/11 Assistance Needed: +2 PT/OT Co-Evaluation/Treatment: Yes    Subjective Data  Subjective: I want to walk. Patient Stated Goal: return home   Cognition  Overall Cognitive Status: Appears within functional limits for tasks assessed/performed Arousal/Alertness: Awake/alert Orientation Level: Appears intact for tasks assessed Behavior During Session: [facility] for tasks performed    Balance     End of Session PT - End of Session Equipment Utilized During Treatment: Gait belt Activity Tolerance: Treatment limited secondary to medical complications (Comment) (hypotension) Patient left: in bed;with call bell/phone within reach Nurse Communication: Mobility status;Other (comment) (hypotension)    Ilda Foil 07/27/2011, 1:13 PM  Aida Raider, PT  Office # [phone] Pager [phone]

## 2011-07-27 NOTE — Progress Notes (Signed)
Occupational Therapy Treatment Patient Details Name: RHYLEN SHAHEEN MRN: [id number] DOB: [birth year] Today's Date: 07/27/2011 Time: 5366-4403 OT Time Calculation (min): 46 min  OT Assessment / Plan / Recommendation Comments on Treatment Session Treatment limited today by hypotension.  (See vitals section).  Pt VERY motivated to participate in therapy and responds well to verbal/manual feedback from therapist for transfer techniques.  Pt continues to be an excellent candidate for CIR.    Follow Up Recommendations  Inpatient Rehab    Barriers to Discharge       Equipment Recommendations  Defer to next venue    Recommendations for Other Services Rehab consult  Frequency Min 2X/week   Plan Discharge plan remains appropriate    Precautions / Restrictions Precautions Precautions: Fall Restrictions Weight Bearing Restrictions: No   Pertinent Vitals/Pain Pt reported feeling dizzy sitting EOB.  Attempted to take BP but unable to get a reading with dynamap.  Pt reported dizziness had subsided after sitting EOB ~10 min.  After stand pivot to chair but reported dizziness had returned.  Able to get BP with pt in chair which read 85/57.  RN notified and requested pt return to bed.   Once pt in supine in bed, BP read 84/61 and then 104/63 ~5 min later.      ADL  Eating/Feeding: Performed;Modified independent Where Assessed - Eating/Feeding: Bed level Toilet Transfer: Simulated;+2 Total assistance Toilet Transfer: Patient Percentage: 60% Toilet Transfer Method: Stand pivot Acupuncturist: Other (comment) (recliner) Equipment Used: Gait belt;Rolling walker Transfers/Ambulation Related to ADLs: Verbally cued pt to take big steps and exaggerate LE movement during transfer.    OT Diagnosis:    OT Problem List:   OT Treatment Interventions:     OT Goals ADL Goals Additional ADL Goal #1: Pt will perform 3/3 toileting tasks with supervision. ADL Goal: Additional Goal #1 - Progress:  Progressing toward goals Miscellaneous OT Goals Miscellaneous OT Goal #1: Pt will perform sit to stand from chair with supervision as precursor for functional mobility. OT Goal: Miscellaneous Goal #1 - Progress: Progressing toward goals  Visit Information  Last OT Received On: 07/27/11 Assistance Needed: +2 PT/OT Co-Evaluation/Treatment: Yes    Subjective Data      Prior Functioning       Cognition  Overall Cognitive Status: Appears within functional limits for tasks assessed/performed Arousal/Alertness: Awake/alert Orientation Level: Appears intact for tasks assessed Behavior During Session: [facility] for tasks performed    Mobility Bed Mobility Bed Mobility: Supine to Sit;Sitting - Scoot to Edge of Bed Supine to Sit: 4: Min assist;With rails Sitting - Scoot to [place] of Bed: 5: Supervision Details for Bed Mobility Assistance: Assist to trunk to facilitate anterior weight shift. Pt required increased time to complete task. Transfers Transfers: Sit to Stand;Stand to Sit Sit to Stand: 1: +2 Total assist;From bed;From chair/3-in-1;With upper extremity assist;With armrests Sit to Stand: Patient Percentage: 60% Stand to Sit: 1: +2 Total assist;To chair/3-in-1;To bed;With armrests;With upper extremity assist Stand to Sit: Patient Percentage: 60% Details for Transfer Assistance: Performed sit <>stand 3x. +2 assist to provide manual cueing through bil. hips to facilitate anterior weight shift.  Prior to liftoff from bed/chair, therapist placed RW to pt's left side and placed object (black glasses case) anteriorly on floor.  Cued pt to to focus on glasses case to encourage anterior translation during lift off.  Once pt in standing, placed RW in front of pt.   Exercises    Balance    End of Session OT -  End of Session Equipment Utilized During Treatment: Gait belt Activity Tolerance: Treatment limited secondary to medical complications (Comment) Patient left: in bed;with call bell/phone within  reach;with bed alarm set Nurse Communication: Mobility status (BP readings)  07/27/2011 Cipriano Mile OTR/L Pager [phone] Office [phone]  [place]iff, [place] 07/27/2011, 12:39 PM

## 2011-07-27 NOTE — Progress Notes (Signed)
Patient ID: [name], male   DOB: November 01, 1929, 78 y.o.   MRN: [id number] Stable, decrease of swelling.

## 2011-07-28 ENCOUNTER — Inpatient Hospital Stay (HOSPITAL_COMMUNITY)
Admission: RE | Admit: 2011-07-28 | Discharge: 2011-08-14 | DRG: 945 | Disposition: A | Discharge status: Skilled Nursing Facility | Payer: Medicare Other | Source: Ambulatory Visit | Attending: Physical Medicine & Rehabilitation | Admitting: Physical Medicine & Rehabilitation

## 2011-07-28 ENCOUNTER — Encounter (HOSPITAL_COMMUNITY): Payer: Self-pay | Admitting: Physical Medicine and Rehabilitation

## 2011-07-28 DIAGNOSIS — Z9861 Coronary angioplasty status: Secondary | ICD-10-CM

## 2011-07-28 DIAGNOSIS — Y831 Surgical operation with implant of artificial internal device as the cause of abnormal reaction of the patient, or of later complication, without mention of misadventure at the time of the procedure: Secondary | ICD-10-CM | POA: Diagnosis present

## 2011-07-28 DIAGNOSIS — Z5189 Encounter for other specified aftercare: Principal | ICD-10-CM

## 2011-07-28 DIAGNOSIS — F05 Delirium due to known physiological condition: Secondary | ICD-10-CM | POA: Insufficient documentation

## 2011-07-28 DIAGNOSIS — A4189 Other specified sepsis: Secondary | ICD-10-CM

## 2011-07-28 DIAGNOSIS — E785 Hyperlipidemia, unspecified: Secondary | ICD-10-CM | POA: Diagnosis present

## 2011-07-28 DIAGNOSIS — L089 Local infection of the skin and subcutaneous tissue, unspecified: Secondary | ICD-10-CM | POA: Insufficient documentation

## 2011-07-28 DIAGNOSIS — K5900 Constipation, unspecified: Secondary | ICD-10-CM | POA: Diagnosis present

## 2011-07-28 DIAGNOSIS — J4489 Other specified chronic obstructive pulmonary disease: Secondary | ICD-10-CM | POA: Diagnosis present

## 2011-07-28 DIAGNOSIS — R269 Unspecified abnormalities of gait and mobility: Secondary | ICD-10-CM

## 2011-07-28 DIAGNOSIS — Z96659 Presence of unspecified artificial knee joint: Secondary | ICD-10-CM

## 2011-07-28 DIAGNOSIS — R5381 Other malaise: Secondary | ICD-10-CM

## 2011-07-28 DIAGNOSIS — T85738A Infection and inflammatory reaction due to other nervous system device, implant or graft, initial encounter: Secondary | ICD-10-CM | POA: Diagnosis present

## 2011-07-28 DIAGNOSIS — Z66 Do not resuscitate: Secondary | ICD-10-CM | POA: Diagnosis not present

## 2011-07-28 DIAGNOSIS — B958 Unspecified staphylococcus as the cause of diseases classified elsewhere: Secondary | ICD-10-CM | POA: Diagnosis present

## 2011-07-28 DIAGNOSIS — K219 Gastro-esophageal reflux disease without esophagitis: Secondary | ICD-10-CM | POA: Diagnosis present

## 2011-07-28 DIAGNOSIS — J449 Chronic obstructive pulmonary disease, unspecified: Secondary | ICD-10-CM | POA: Diagnosis present

## 2011-07-28 DIAGNOSIS — M1990 Unspecified osteoarthritis, unspecified site: Secondary | ICD-10-CM | POA: Diagnosis present

## 2011-07-28 DIAGNOSIS — D649 Anemia, unspecified: Secondary | ICD-10-CM | POA: Diagnosis present

## 2011-07-28 DIAGNOSIS — I2510 Atherosclerotic heart disease of native coronary artery without angina pectoris: Secondary | ICD-10-CM | POA: Diagnosis present

## 2011-07-28 DIAGNOSIS — G20 Parkinson's disease: Secondary | ICD-10-CM

## 2011-07-28 DIAGNOSIS — G20A1 Parkinson's disease without dyskinesia, without mention of fluctuations: Secondary | ICD-10-CM | POA: Insufficient documentation

## 2011-07-28 DIAGNOSIS — I959 Hypotension, unspecified: Secondary | ICD-10-CM | POA: Diagnosis present

## 2011-07-28 DIAGNOSIS — I4891 Unspecified atrial fibrillation: Secondary | ICD-10-CM | POA: Insufficient documentation

## 2011-07-28 DIAGNOSIS — I10 Essential (primary) hypertension: Secondary | ICD-10-CM | POA: Diagnosis present

## 2011-07-28 LAB — WOUND CULTURE

## 2011-07-28 MED ORDER — ACETAMINOPHEN 325 MG PO TABS
325.0000 mg | ORAL_TABLET | ORAL | Status: DC | PRN
  Administered 2011-08-01 – 2011-08-13 (×2): 650 mg via ORAL
  Filled 2011-07-28 (×3): qty 2

## 2011-07-28 MED ORDER — FLUTICASONE-SALMETEROL 100-50 MCG/DOSE IN AEPB
1.0000 | INHALATION_SPRAY | Freq: Two times a day (BID) | RESPIRATORY_TRACT | Status: DC
  Administered 2011-07-30 – 2011-08-14 (×27): 1 via RESPIRATORY_TRACT
  Filled 2011-07-28 (×4): qty 14

## 2011-07-28 MED ORDER — ALUM & MAG HYDROXIDE-SIMETH 200-200-20 MG/5ML PO SUSP: 30.0000 mL | ORAL | Status: DC | PRN

## 2011-07-28 MED ORDER — TRAZODONE HCL 50 MG PO TABS
25.0000 mg | ORAL_TABLET | Freq: Every evening | ORAL | Status: DC | PRN
  Administered 2011-07-31 – 2011-08-13 (×7): 50 mg via ORAL
  Filled 2011-07-28 (×7): qty 1

## 2011-07-28 MED ORDER — ALBUTEROL SULFATE (5 MG/ML) 0.5% IN NEBU
2.5000 mg | INHALATION_SOLUTION | Freq: Four times a day (QID) | RESPIRATORY_TRACT | Status: DC | PRN
  Administered 2011-08-03: 2.5 mg via RESPIRATORY_TRACT
  Filled 2011-07-28: qty 0.5

## 2011-07-28 MED ORDER — PROCHLORPERAZINE 25 MG RE SUPP
12.5000 mg | Freq: Four times a day (QID) | RECTAL | Status: DC | PRN
  Filled 2011-07-28: qty 1

## 2011-07-28 MED ORDER — MENTHOL 3 MG MT LOZG
1.0000 | LOZENGE | OROMUCOSAL | Status: DC | PRN
  Filled 2011-07-28: qty 9

## 2011-07-28 MED ORDER — PROCHLORPERAZINE EDISYLATE 5 MG/ML IJ SOLN
5.0000 mg | Freq: Four times a day (QID) | INTRAMUSCULAR | Status: DC | PRN
  Filled 2011-07-28: qty 2

## 2011-07-28 MED ORDER — FLEET ENEMA 7-19 GM/118ML RE ENEM
1.0000 | ENEMA | Freq: Once | RECTAL | Status: AC | PRN
  Filled 2011-07-28: qty 1

## 2011-07-28 MED ORDER — AMANTADINE HCL 100 MG PO CAPS
100.0000 mg | ORAL_CAPSULE | Freq: Two times a day (BID) | ORAL | Status: DC
  Administered 2011-07-28 – 2011-08-14 (×34): 100 mg via ORAL
  Filled 2011-07-28 (×36): qty 1

## 2011-07-28 MED ORDER — VITAMIN B-6 50 MG PO TABS
50.0000 mg | ORAL_TABLET | Freq: Every day | ORAL | Status: DC
  Administered 2011-07-29 – 2011-08-14 (×17): 50 mg via ORAL
  Filled 2011-07-28 (×18): qty 1

## 2011-07-28 MED ORDER — PHENOL 1.4 % MT LIQD
1.0000 | OROMUCOSAL | Status: DC | PRN
  Filled 2011-07-28: qty 177

## 2011-07-28 MED ORDER — DOCUSATE SODIUM 100 MG PO CAPS
100.0000 mg | ORAL_CAPSULE | Freq: Two times a day (BID) | ORAL | Status: DC
  Administered 2011-07-28 – 2011-08-05 (×16): 100 mg via ORAL
  Filled 2011-07-28 (×18): qty 1

## 2011-07-28 MED ORDER — POLYETHYLENE GLYCOL 3350 17 G PO PACK
17.0000 g | PACK | Freq: Every day | ORAL | Status: DC
  Administered 2011-07-28 – 2011-08-14 (×18): 17 g via ORAL
  Filled 2011-07-28 (×20): qty 1

## 2011-07-28 MED ORDER — GUAIFENESIN-DM 100-10 MG/5ML PO SYRP: 5.0000 mL | ORAL_SOLUTION | Freq: Four times a day (QID) | ORAL | Status: DC | PRN

## 2011-07-28 MED ORDER — FLUTICASONE PROPIONATE 50 MCG/ACT NA SUSP
1.0000 | Freq: Every day | NASAL | Status: DC | PRN
  Filled 2011-07-28: qty 16

## 2011-07-28 MED ORDER — LINEZOLID 600 MG PO TABS
600.0000 mg | ORAL_TABLET | Freq: Two times a day (BID) | ORAL | Status: DC
  Administered 2011-07-28 – 2011-08-14 (×34): 600 mg via ORAL
  Filled 2011-07-28 (×36): qty 1

## 2011-07-28 MED ORDER — CARBIDOPA-LEVODOPA 25-250 MG PO TABS
2.0000 | ORAL_TABLET | ORAL | Status: DC
  Filled 2011-07-28 (×2): qty 2

## 2011-07-28 MED ORDER — ASPIRIN EC 81 MG PO TBEC
81.0000 mg | DELAYED_RELEASE_TABLET | Freq: Every day | ORAL | Status: DC
  Administered 2011-07-29 – 2011-08-14 (×17): 81 mg via ORAL
  Filled 2011-07-28 (×18): qty 1

## 2011-07-28 MED ORDER — B COMPLEX-C PO TABS
1.0000 | ORAL_TABLET | Freq: Every day | ORAL | Status: DC
  Administered 2011-07-29 – 2011-08-14 (×17): 1 via ORAL
  Filled 2011-07-28 (×21): qty 1

## 2011-07-28 MED ORDER — HYDROCODONE-ACETAMINOPHEN 5-325 MG PO TABS
1.0000 | ORAL_TABLET | Freq: Four times a day (QID) | ORAL | Status: DC | PRN
  Administered 2011-08-03 – 2011-08-11 (×4): 1 via ORAL
  Administered 2011-08-12 (×2): 2 via ORAL
  Administered 2011-08-12: 1 via ORAL
  Filled 2011-07-28 (×3): qty 1
  Filled 2011-07-28: qty 2
  Filled 2011-07-28: qty 1
  Filled 2011-07-28: qty 2
  Filled 2011-07-28: qty 1

## 2011-07-28 MED ORDER — IPRATROPIUM BROMIDE 0.02 % IN SOLN
0.5000 mg | Freq: Four times a day (QID) | RESPIRATORY_TRACT | Status: DC | PRN
  Administered 2011-08-03: 0.5 mg via RESPIRATORY_TRACT
  Filled 2011-07-28: qty 2.5

## 2011-07-28 MED ORDER — DM-GUAIFENESIN ER 30-600 MG PO TB12
1.0000 | ORAL_TABLET | Freq: Two times a day (BID) | ORAL | Status: DC
  Administered 2011-07-28 – 2011-07-30 (×4): 1 via ORAL
  Filled 2011-07-28 (×6): qty 1

## 2011-07-28 MED ORDER — PROCHLORPERAZINE MALEATE 5 MG PO TABS
5.0000 mg | ORAL_TABLET | Freq: Four times a day (QID) | ORAL | Status: DC | PRN
  Filled 2011-07-28: qty 2

## 2011-07-28 MED ORDER — BISACODYL 10 MG RE SUPP
10.0000 mg | Freq: Every day | RECTAL | Status: DC | PRN
  Administered 2011-08-13: 10 mg via RECTAL
  Filled 2011-07-28 (×2): qty 1

## 2011-07-28 MED ORDER — CARBIDOPA-LEVODOPA 25-250 MG PO TABS
1.0000 | ORAL_TABLET | ORAL | Status: DC
  Administered 2011-07-28 – 2011-07-29 (×4): 1 via ORAL
  Filled 2011-07-28 (×7): qty 1

## 2011-07-28 MED ORDER — MUPIROCIN 2 % EX OINT
TOPICAL_OINTMENT | Freq: Two times a day (BID) | CUTANEOUS | Status: DC
  Administered 2011-07-29 – 2011-08-09 (×24): via NASAL
  Administered 2011-08-10: 1 via NASAL
  Administered 2011-08-10 – 2011-08-13 (×7): via NASAL
  Filled 2011-07-28 (×4): qty 22

## 2011-07-28 MED ORDER — MIDODRINE HCL 5 MG PO TABS
5.0000 mg | ORAL_TABLET | Freq: Two times a day (BID) | ORAL | Status: DC
  Administered 2011-07-29: 5 mg via ORAL
  Filled 2011-07-28 (×3): qty 1

## 2011-07-28 MED ORDER — PANTOPRAZOLE SODIUM 40 MG PO TBEC
40.0000 mg | DELAYED_RELEASE_TABLET | Freq: Every day | ORAL | Status: DC
  Administered 2011-07-29 – 2011-08-13 (×16): 40 mg via ORAL
  Filled 2011-07-28 (×16): qty 1

## 2011-07-28 MED ORDER — MELOXICAM 15 MG PO TABS
15.0000 mg | ORAL_TABLET | Freq: Every day | ORAL | Status: DC
  Administered 2011-07-29 – 2011-08-05 (×8): 15 mg via ORAL
  Filled 2011-07-28 (×9): qty 1

## 2011-07-28 MED ORDER — PRAMIPEXOLE DIHYDROCHLORIDE 0.25 MG PO TABS
0.5000 mg | ORAL_TABLET | ORAL | Status: DC
  Administered 2011-07-28 – 2011-07-29 (×4): 0.5 mg via ORAL
  Filled 2011-07-28 (×7): qty 2

## 2011-07-28 MED ORDER — PRAMIPEXOLE DIHYDROCHLORIDE 1 MG PO TABS
1.0000 mg | ORAL_TABLET | ORAL | Status: DC
  Filled 2011-07-28 (×2): qty 1

## 2011-07-28 NOTE — H&P (Signed)
Physical Medicine and Rehabilitation Admission H&P  CC: Infected DBS, Parkinson's disease, hypotension  HPI: [name] is a 78 year old male known to CIR from prior stay for lumbar decompression (10/2009 d/c to SNF), Parkinson's diease with DBS who developed drainage from behind his left ear in march and has been on IV antibiotics since March 26th? Admitted on 05/20 for removal of DBS by Dr. Venetia Maxon. Post op on zyvox for 6 weeks per Dr. Zenaida Niece Dam's input. Noted to have increase in dyskinesias past DBS removal. Orthostatic symptoms improving. Patient noted to be deconditioned.  Review of Systems  Eyes: Negative for blurred vision and double vision.  Respiratory: Negative for shortness of breath and wheezing.  Cardiovascular: Negative for chest pain and palpitations.  Gastrointestinal: Positive for constipation. Negative for heartburn and nausea.  Genitourinary:  Urgency with incontinence  Neurological: Positive for tremors. Negative for headaches.   Past Medical History   Diagnosis  Date   .  HYPERLIPIDEMIA-MIXED  02/10/2008   .  PARKINSON'S DISEASE  02/10/2008   .  HYPERTENSION, BENIGN  02/09/2009   .  GASTROINTESTINAL HEMORRHAGE, HX OF  02/10/2008   .  ANEMIA  04/29/2010   .  FIBRILLATION, ATRIAL  04/29/2010   .  VENTRAL HERNIA  04/29/2010   .  DEGENERATIVE JOINT DISEASE  04/29/2010   .  Gastric ulcer    .  Positive PPD    .  Internal hemorrhoids    .  Hyperplastic colon polyp  2004   .  CAD (coronary artery disease)      23.0 x 24 mm Taxus DES mid LAD 2005, DCA diagonal branch 2005   .  VENOUS INSUFFICIENCY  02/10/2008     rt   .  Shortness of breath    .  Osteomyelitis    .  Calculus of kidney    .  Tuberculosis      78 yrs old   .  GERD (gastroesophageal reflux disease)      ulcers hx   .  Infection and inflammatory reaction due to device, implant, and graft    .  Neuromuscular disorder    .  Sundowning      past lumbar decompressive surgery    Past Surgical History   Procedure   Date   .  Replacement total knee  2002     right   .  Nose surgery    .  Ankle surgery      left   .  Toe amputation      right foot   .  Cataract extraction    .  Carpal tunnel release      left, open rt   .  Tonsillectomy    .  Appendectomy    .  Coronary angioplasty with stent placement    .  Back surgery  2006     back x3   .  Deep brain stimulator placement  02     batteries changed 07   .  Subthalamic stimulator battery replacement  02/27/2011     Procedure: SUBTHALAMIC STIMULATOR BATTERY REPLACEMENT; Surgeon: Dorian Heckle, MD; Location: [facility]; Service: Neurosurgery; Laterality: Left; DBS battery replacment x 2 - Left Chest and Abdomen   .  Eye muscle surgery      bilat   .  Subthalamic stimulator removal  05/22/2011     Procedure: SUBTHALAMIC STIMULATOR REMOVAL; Surgeon: Maeola Harman, MD; Location: [facility]; Service: Neurosurgery; Laterality: N/A; removal  of IPG   .  Subthalamic stimulator removal  07/24/2011     Procedure: SUBTHALAMIC STIMULATOR REMOVAL; Surgeon: Maeola Harman, MD; Location: [facility]; Service: Neurosurgery; Laterality: N/A; Deep Brain stimulator hardware removal   .  Foot amputation through metatarsal      right    Family History   Problem  Relation  Age of Onset   .  Parkinsonism  Sister    .  Diabetes  Brother    .  Crohn's disease  Daughter    .  Heart disease  Paternal Grandfather    .  Colon cancer  Neg Hx     Social History: Married. Independent prior to Dec-used RW for ambulation and had problems with tranfers. Has been at [facility] since March for atbx therapy. He reports that he has never smoked. He has never used smokeless tobacco. He reports that he does not drink alcohol or use illicit drugs  Allergies   Allergen  Reactions   .  Morphine    .  Risperidone And Related    .  Tuberculin Tests    .  Vancomycin  Rash    Medications Prior to Admission   Medication  Sig  Dispense  Refill   .  amantadine (SYMMETREL) 100 MG  capsule  Take 100 mg by mouth 2 (two) times daily.     Marland Kitchen  aspirin EC 81 MG tablet  Take 81 mg by mouth daily.     .  B Complex Vitamins (B COMPLEX PO)  Take 1 tablet by mouth 2 (two) times daily.     .  carbidopa-levodopa (SINEMET IR) 25-250 MG per tablet  Take 1 tablet by mouth 4 (four) times daily. 1 tablet at 6AM, 9AM, 12noon, and 6PM     .  carbidopa-levodopa (SINEMET IR) 25-250 MG per tablet  Take 2 tablets by mouth daily. 2 tablets at 3PM     .  ceftaroline (TEFLARO) 600 MG SOLR  Inject 600 mg into the vein every 12 (twelve) hours.     Marland Kitchen  dextromethorphan-guaiFENesin (MUCINEX DM) 30-600 MG per 12 hr tablet  Take 1 tablet by mouth every 12 (twelve) hours.     .  fluticasone (FLONASE) 50 MCG/ACT nasal spray  Place 1 spray into the nose daily as needed. For dry nasal passages     .  HYDROcodone-acetaminophen (NORCO) 5-325 MG per tablet  Take 1-2 tablets by mouth every 6 (six) hours as needed. 1 tablet for moderate pain, 2 tablets for severe pain.     .  meloxicam (MOBIC) 15 MG tablet  Take 15 mg by mouth daily.     Marland Kitchen  omeprazole (PRILOSEC) 40 MG capsule  Take 1 capsule (40 mg total) by mouth daily.  30 capsule  3   .  PARoxetine (PAXIL) 20 MG tablet  Take 1 tablet (20 mg total) by mouth every morning.  30 tablet  11   .  pramipexole (MIRAPEX) 1 MG tablet  Take 0.5 mg by mouth 4 (four) times daily. 1/2 tablet at 6AM, 9AM, 3PM and 6PM     .  pramipexole (MIRAPEX) 1 MG tablet  Take 1 mg by mouth daily. 1 tablet at 12noon     .  Probiotic Product (PROBIOTIC FORMULA PO)  Take 1 capsule by mouth daily.     .  Fluticasone-Salmeterol (ADVAIR) 100-50 MCG/DOSE AEPB  Inhale 1 puff into the lungs every 12 (twelve) hours.     Marland Kitchen  ipratropium-albuterol (DUONEB)  0.5-2.5 (3) MG/3ML SOLN  Take 3 mLs by nebulization every 6 (six) hours as needed. Shortness of breath     .  midodrine (PROAMATINE) 5 MG tablet  Take 5 mg by mouth 2 (two) times daily.     .  polyethylene glycol (MIRALAX / GLYCOLAX) packet  Take 17 g  by mouth daily as needed. For constipation      Home:  Home Living  Lives With: Spouse (SNF past two weeks)  Available Help at Discharge: Available 24 hours/day;Available PRN/intermittently;Family (wife can provide min assist 24/7, daughter PRN)  Type of Home: Apartment  Home Access: Level entry  Home Layout: One level  Bathroom Shower/Tub: Tub/shower unit;Curtain  Bathroom Toilet: Handicapped height  Bathroom Accessibility: Yes  How Accessible: Accessible via walker;Accessible via wheelchair  Home Adaptive Equipment: Wheelchair - manual;Walker - rolling;Tub transfer bench;Bedside commode/3-in-1  Functional History:  Prior Function  Dressing: Minimal (shoes and socks)  Comments: Used RW and w/c around house. Reports that since he has been at [facility], they have been doing all his ADLs.  Functional Status:  Mobility:  Bed Mobility  Bed Mobility: Sit to Supine  Supine to Sit: 4: Min assist  Sitting - Scoot to Edge of Bed: 4: Min guard  Sit to Supine: 1: +2 Total assist  Sit to Supine: Patient Percentage: 50%  Transfers  Transfers: Sit to Stand;Stand to Sit  Sit to Stand: 3: Mod assist;From bed;From chair/3-in-1;With upper extremity assist  Sit to Stand: Patient Percentage: 60%  Stand to Sit: 3: Mod assist;With armrests;To chair/3-in-1  Stand to Sit: Patient Percentage: 60%  Stand Pivot Transfers: 3: Mod assist  Ambulation/Gait  Ambulation/Gait Assistance: 4: Min assist  Ambulation Distance (Feet): 120 Feet  Assistive device: Rolling walker  Ambulation/Gait Assistance Details: Patient able to ambulate 154ft with only one seated rest break. A reuired for safe use of RW and to increased step length with ambulation.  Gait Pattern: Narrow base of support;Shuffle;Trunk flexed;Decreased stride length;Step-through pattern  Gait velocity: decreased gait speed   ADL:  ADL  Eating/Feeding: Performed;Modified independent  Where Assessed - Eating/Feeding: Bed level  Toilet Transfer:  Simulated;+2 Total assistance  Toilet Transfer Method: Stand pivot  Toilet Transfer Equipment: Other (comment) (recliner)  Equipment Used: Gait belt;Rolling walker  Transfers/Ambulation Related to ADLs: Verbally cued pt to take big steps and emphasize LE movement during transfer.  ADL Comments: Max assist with clothing manipulation to thread LE's through undergarment and to pull undergarment up over hips. Pt able to stand with mod assist and bil. UE holding urinal in place while voiding (min assist to steady hands). Pt supporting LE's posteriorly against chair while standing to void.  Cognition:  Cognition  Arousal/Alertness: Awake/alert  Orientation Level: Oriented X4  Cognition  Overall Cognitive Status: Appears within functional limits for tasks assessed/performed  Arousal/Alertness: Awake/alert  Orientation Level: Appears intact for tasks assessed  Behavior During Session: [facility] for tasks performed  Blood pressure 121/56, pulse 90, temperature 98.2 F (36.8 C), temperature source Oral, resp. rate 22, height 5' 11.5" (1.816 m), weight 94.802 kg (209 lb), SpO2 93.00%  .  Physical Exam  Nursing note and vitals reviewed.  Constitutional: He is oriented to person, place, and time. He appears well-developed. He has a sickly appearance.  HENT:  Head: Normocephalic.  Two linear incisions with staples intact-clean and dry without erythema.  Eyes: Pupils are equal, round, and reactive to light.  Neck:  Neck flexed with decreased ROM.  Cardiovascular: Normal rate and regular rhythm.  Pulmonary/Chest: Effort normal. He has decreased breath sounds.  Abdominal: Soft. Bowel sounds are normal.  Musculoskeletal: He exhibits no edema and no tenderness.  Neurological: He is alert and oriented to person, place, and time. He displays tremor. A sensory deficit is present.  Moderate dysarthria. Dyskinesias with intentional tremors. Masked facies. Stocking glove distribution sensory deficits. Follows  basic commands without difficulty. Able to state date (use calender independently), age/DOB. Decreased memory regarding medical issues.  Skin: Skin is warm and dry.   No results found for this or any previous visit (from the past 48 hour(s)).  No results found.  Post Admission Physician Evaluation:  1. Functional deficits secondary to parkinson's disease, deconditioning s/p removal of deep brain stimulator. 2. Patient is admitted to receive collaborative, interdisciplinary care between the physiatrist, rehab nursing staff, and therapy team. 3. Patient's level of medical complexity and substantial therapy needs in context of that medical necessity cannot be provided at a lesser intensity of care such as a SNF. 4. Patient has experienced substantial functional loss from his/her baseline which was documented above under the "Functional History" and "Functional Status" headings. Judging by the patient's diagnosis, physical exam, and functional history, the patient has potential for functional progress which will result in measurable gains while on inpatient rehab. These gains will be of substantial and practical use upon discharge in facilitating mobility and self-care at the household level. 5. Physiatrist will provide 24 hour management of medical needs as well as oversight of the therapy plan/treatment and provide guidance as appropriate regarding the interaction of the two. 6. 24 hour rehab nursing will assist with bladder management, bowel management, safety, skin/wound care, disease management, medication administration, pain management and patient education and help integrate therapy concepts, techniques,education, etc. 7. PT will assess and treat for: lower extremity strength, coordination, NMR, adaptive equipment training, gait, balance. Goals are: supervision to mod I. 8. OT will assess and treat for: upper extremity strength, ADL's, fxnl mobility, safety, NMR, coordination, adaptive equipment  training. Goals are: mod I to min assist. 9. SLP will assess and treat for: speech intellgibility, cognition, communication. Goals are: supervision to min assist. 10. Case Management and Social Worker will assess and treat for psychological issues and discharge planning. 11. Team conference will be held weekly to assess progress toward goals and to determine barriers to discharge. 12. Patient will receive at least 3 hours of therapy per day at least 5 days per week. 13. ELOS and Prognosis: 7-10 days good Medical Problem List and Plan:  1. DVT Prophylaxis/Anticoagulation: Mechanical: Sequential compression devices, below knee Bilateral lower extremities  2. Pain Management: prn tylenol for now to avoid neuro/sedating SE. History of delirium past back surgery. Will monitor for now. ?Baseline dementia.  3. Mood: monitor for now. Off paxil to avoid SE as on Zyvox. LCSW to follow up for evaluation.  4. Parkinson's disease: Will continue mirapex, sinemet and amantadine at home schedule. Continue midodrine for orthostasis. Will schedule medications at 7 am and noon to help maintain BP with activity/therapies.  5. Staph infection: Zyvox D # 4/42. Will have CM look into Zyvox assistance program to help procure meds [phone])  6. Constipation: Will schedule miralax daily.  7. ?COPD/reactive airway disease: Will continue advair bid as at home.  Ivory Broad, MD 07/28/11

## 2011-07-28 NOTE — Consult Note (Signed)
Physical Medicine and Rehabilitation Consult Reason for Consult: Parkinson's disease, hypotension Referring Physician:  Dr. Venetia Maxon   HPI: [name] is a 78 year old male known to CIR from prior stay for lumbar decompression (10/2009 d/c to SN[facility]), Parkinson's diease with DBS who developed drainage from behind his left ear in march and has been on IV antibiotics since March 26th?  Admitted on 05/20 for removal of DBS by Dr. Venetia Maxon.  Post op on zyvox for 6 weeks per Dr. Zenaida Niece Dam's input. Noted to have increase in dyskinesias past DBS removal.  Orthostatic symptoms improving.  MD, PT, OT recommending CIR.   Review of Systems  Eyes: Negative for blurred vision and double vision.  Respiratory: Negative.   Cardiovascular: Negative.   Gastrointestinal: Positive for constipation. Negative for heartburn and nausea.  Genitourinary:       Incontinence  Musculoskeletal: Negative.    Past Medical History  Diagnosis Date  . HYPERLIPIDEMIA-MIXED 02/10/2008  . PARKINSON'S DISEASE 02/10/2008  . HYPERTENSION, BENIGN 02/09/2009  . GASTROINTESTINAL HEMORRHAGE, HX OF 02/10/2008  . ANEMIA 04/29/2010  . FIBRILLATION, ATRIAL 04/29/2010  . VENTRAL HERNIA 04/29/2010  . DEGENERATIVE JOINT DISEASE 04/29/2010  . Gastric ulcer   . Positive PPD   . Internal hemorrhoids   . Hyperplastic colon polyp 2004  . CAD (coronary artery disease)     23.0 x 24 mm Taxus DES mid LAD 2005, DCA diagonal branch 2005  . VENOUS INSUFFICIENCY 02/10/2008    rt  . Shortness of breath   . Osteomyelitis   . Calculus of kidney   . Tuberculosis     78 yrs old  . GERD (gastroesophageal reflux disease)     ulcers hx  . Infection and inflammatory reaction due to device, implant, and graft   . Neuromuscular disorder   . Sundowning     past lumbar decompressive surgery   Past Surgical History  Procedure Date  . Replacement total knee 2002    right  . Nose surgery   . Ankle surgery     left  . Toe amputation     right foot  . Cataract  extraction   . Carpal tunnel release     left, open rt  . Tonsillectomy   . Appendectomy   . Coronary angioplasty with stent placement   . Back surgery 2006     back x3  . Deep brain stimulator placement 02    batteries changed 07  . Subthalamic stimulator battery replacement 02/27/2011    Procedure: SUBTHALAMIC STIMULATOR BATTERY REPLACEMENT;  Surgeon: Dorian Heckle, MD;  Location: [facility];  Service: Neurosurgery;  Laterality: Left;  DBS battery replacment x 2 - Left Chest and Abdomen  . Eye muscle surgery     bilat  . Subthalamic stimulator removal 05/22/2011    Procedure: SUBTHALAMIC STIMULATOR REMOVAL;  Surgeon: Maeola Harman, MD;  Location: [facility];  Service: Neurosurgery;  Laterality: N/A;  removal of IPG  . Subthalamic stimulator removal 07/24/2011    Procedure: SUBTHALAMIC STIMULATOR REMOVAL;  Surgeon: Maeola Harman, MD;  Location: [facility];  Service: Neurosurgery;  Laterality: N/A;  Deep Brain stimulator hardware removal  . Foot amputation through metatarsal     right   Family History  Problem Relation Age of Onset  . Parkinsonism Sister   . Diabetes Brother   . Crohn's disease Daughter   . Heart disease Paternal Grandfather   . Colon cancer Neg Hx    Social History: Married.  Independent prior  to Dec-used RW for ambulation and had problems with transitional movements.  Has [facility] at [facility] for atbx therapy.  He  reports that he has never smoked. He has never used smokeless tobacco. He reports that he does not drink alcohol or use illicit drugs. Allergies:  Allergies  Allergen Reactions  . Morphine   . Risperidone And Related   . Tuberculin Tests   . Vancomycin Rash   Medications Prior to Admission  Medication Sig Dispense Refill  . amantadine (SYMMETREL) 100 MG capsule Take 100 mg by mouth 2 (two) times daily.       Marland Kitchen aspirin EC 81 MG tablet Take 81 mg by mouth daily.      . B Complex Vitamins (B COMPLEX PO) Take 1 tablet by mouth 2 (two)  times daily.       . carbidopa-levodopa (SINEMET IR) 25-250 MG per tablet Take 1 tablet by mouth 4 (four) times daily. 1 tablet at 6AM, 9AM, 12noon, and 6PM      . carbidopa-levodopa (SINEMET IR) 25-250 MG per tablet Take 2 tablets by mouth daily. 2 tablets at 3PM      . ceftaroline (TEFLARO) 600 MG SOLR Inject 600 mg into the vein every 12 (twelve) hours.      Marland Kitchen dextromethorphan-guaiFENesin (MUCINEX DM) 30-600 MG per 12 hr tablet Take 1 tablet by mouth every 12 (twelve) hours.      . fluticasone (FLONASE) 50 MCG/ACT nasal spray Place 1 spray into the nose daily as needed. For dry nasal passages      . HYDROcodone-acetaminophen (NORCO) 5-325 MG per tablet Take 1-2 tablets by mouth every 6 (six) hours as needed. 1 tablet for moderate pain, 2 tablets for severe pain.      . meloxicam (MOBIC) 15 MG tablet Take 15 mg by mouth daily.       Marland Kitchen omeprazole (PRILOSEC) 40 MG capsule Take 1 capsule (40 mg total) by mouth daily.  30 capsule  3  . PARoxetine (PAXIL) 20 MG tablet Take 1 tablet (20 mg total) by mouth every morning.  30 tablet  11  . pramipexole (MIRAPEX) 1 MG tablet Take 0.5 mg by mouth 4 (four) times daily. 1/2 tablet at 6AM, 9AM, 3PM and 6PM      . pramipexole (MIRAPEX) 1 MG tablet Take 1 mg by mouth daily. 1 tablet at 12noon      . Probiotic Product (PROBIOTIC FORMULA PO) Take 1 capsule by mouth daily.      . Fluticasone-Salmeterol (ADVAIR) 100-50 MCG/DOSE AEPB Inhale 1 puff into the lungs every 12 (twelve) hours.      Marland Kitchen ipratropium-albuterol (DUONEB) 0.5-2.5 (3) MG/3ML SOLN Take 3 mLs by nebulization every 6 (six) hours as needed. Shortness of breath      . midodrine (PROAMATINE) 5 MG tablet Take 5 mg by mouth 2 (two) times daily.       . polyethylene glycol (MIRALAX / GLYCOLAX) packet Take 17 g by mouth daily as needed. For constipation        Home: Home Living Lives With: Spouse (SNF past two weeks) Available Help at Discharge: Available 24 hours/day;Available PRN/intermittently;Family  (wife can provide min assist 24/7, daughter PRN) Type of Home: Apartment Home Access: Level entry Home Layout: One level Bathroom Shower/Tub: Tub/shower unit;Curtain Bathroom Toilet: Handicapped height Bathroom Accessibility: Yes How Accessible: Accessible via walker;Accessible via wheelchair Home Adaptive Equipment: Wheelchair - manual;Walker - rolling;Tub transfer bench;Bedside commode/3-in-1  Functional History: Prior Function Dressing: Minimal (shoes and socks) Comments:  Used RW and w/c around house.  Reports that since he has been at [facility], they have been doing all his ADLs. Functional Status:  Mobility: Bed Mobility Bed Mobility: Sit to Supine Supine to Sit: 4: Min assist;With rails Sitting - Scoot to Edge of Bed: 5: Supervision Sit to Supine: 1: +2 Total assist Sit to Supine: Patient Percentage: 50% Transfers Transfers: Sit to Stand;Stand to Sit Sit to Stand: 1: +2 Total assist;With upper extremity assist;From bed;From chair/3-in-1 Sit to Stand: Patient Percentage: 60% Stand to Sit: 1: +2 Total assist;To bed;To chair/3-in-1;With upper extremity assist Stand to Sit: Patient Percentage: 60% Stand Pivot Transfers: 3: Mod assist Ambulation/Gait Ambulation/Gait Assistance: Not tested (comment) (due to BP) Ambulation Distance (Feet): 100 Feet Assistive device: Rolling walker Ambulation/Gait Assistance Details: Unable to ambulate due to hypotension. Gait Pattern: Shuffle;Trunk flexed;Narrow base of support;Decreased hip/knee flexion - right;Decreased hip/knee flexion - left;Step-to pattern Gait velocity: decreased gait speed    ADL: ADL Eating/Feeding: Performed;Modified independent Where Assessed - Eating/Feeding: Bed level Toilet Transfer: Simulated;+2 Total assistance Toilet Transfer Method: Stand pivot Toilet Transfer Equipment: Other (comment) (recliner) Equipment Used: Gait belt;Rolling walker Transfers/Ambulation Related to ADLs: Verbally cued pt to take big  steps and emphasize LE movement during transfer. ADL Comments: Max assist with clothing manipulation to thread LE's through undergarment and to pull undergarment up over hips.  Pt able to stand with mod assist and bil. UE holding urinal in place while voiding (min assist to steady hands).  Pt supporting LE's posteriorly against chair while standing to void.  Cognition: Cognition Arousal/Alertness: Awake/alert Orientation Level: Oriented X4 Cognition Overall Cognitive Status: Appears within functional limits for tasks assessed/performed Arousal/Alertness: Awake/alert Orientation Level: Appears intact for tasks assessed Behavior During Session: [facility] for tasks performed  Blood pressure 121/56, pulse 90, temperature 98.2 F (36.8 C), temperature source Oral, resp. rate 22, height 5' 11.5" (1.816 m), weight 94.802 kg (209 lb), SpO2 93.00%. Physical Exam  Nursing note and vitals reviewed. Constitutional: He is oriented to person, place, and time. Vital signs are normal. He appears ill.  HENT:  Head: Normocephalic and atraumatic.       Dry dressing on scalp  Eyes: Pupils are equal, round, and reactive to light.  Neck: Normal range of motion. Neck supple.  Cardiovascular: Normal rate and regular rhythm.   Pulmonary/Chest: Effort normal and breath sounds normal.  Abdominal: Soft. Bowel sounds are normal.  Musculoskeletal: He exhibits no edema and no tenderness.  Neurological: He is alert and oriented to person, place, and time. He displays abnormal reflex. Coordination abnormal.       Masked facies.  Dyskinesias with intentional tremors.  Speech quite dysarthric. Fair insight and awareness.  Forward head posture in sitting.   Strength nearly 4/5 Stocking glove sensory loss in the lower greater than upper extremities.     No results found for this or any previous visit (from the past 24 hour(s)). No results found.  Assessment/Plan: Diagnosis: Parkinson's disease with removal of DBS due to  infection 1. Does the need for close, 24 hr/day medical supervision in concert with the patient's rehab needs make it unreasonable for this patient to be served in a less intensive setting? Yes 2. Co-Morbidities requiring supervision/potential complications: htn, ID, afib, pain 3. Due to bladder management, bowel management, safety, skin/wound care, disease management, medication administration, pain management and patient education, does the patient require 24 hr/day rehab nursing? Yes 4. Does the patient require coordinated care of a physician, rehab nurse, PT (1-2 hrs/day, 5  days/week), OT (1-2 hrs/day, 5 days/week) and SLP (1-2 hrs/day, 5 days/week) to address physical and functional deficits in the context of the above medical diagnosis(es)? Yes Addressing deficits in the following areas: balance, endurance, locomotion, strength, transferring, bowel/bladder control, bathing, dressing, feeding, grooming, toileting, cognition, speech, language, swallowing and psychosocial support 5. Can the patient actively participate in an intensive therapy program of at least 3 hrs of therapy per day at least 5 days per week? Yes 6. The potential for patient to make measurable gains while on inpatient rehab is excellent 7. Anticipated functional outcomes upon discharge from inpatient rehab are supervision to mod I with PT, supervision to min assist with OT, supervision  with SLP. 8. Estimated rehab length of stay to reach the above functional goals is: 7-10 days 9. Does the patient have adequate social supports to accommodate these discharge functional goals? Yes 10. Anticipated D/C setting: Home 11. Anticipated post D/C treatments: HH therapy 12. Overall Rehab/Functional Prognosis: good  RECOMMENDATIONS: This patient's condition is appropriate for continued rehabilitative care in the following setting: CIR Patient has agreed to participate in recommended program. Yes Note that insurance prior authorization  may be required for reimbursement for recommended care.  Comment:Rehab RN to follow up   Ivory Broad, MD 07/28/2011

## 2011-07-28 NOTE — Progress Notes (Signed)
Patient received at 1630 from unit 3000 alert and oriented times 3. Patient and wife oriented to room and call bell system . Patient and wife verbalized understanding of rehab process. Blood pressure 195/106 lying . Tremors noted to bilateral hands and arms . Patient reassessed and blood pressure rechecked after patient ate dinner and off bed pan  For 207/114.  Attempted to recheck blood pressure with patient sitting on edge of bed  And unable to  Obtain blood pressure patient tremors moderate and unable to get reading manual or automatic. Cipriano Mile, PA notified . Will attempt to recheck when patient sitting at edge of bed and tremors decrease . Skin cool clammy denies chest pain . Continue with plan of care.                                                      Cleotilde Neer

## 2011-07-28 NOTE — Progress Notes (Signed)
Physical Therapy Treatment Patient Details Name: [name] MRN: [id number] DOB: 12-23-1929 Today's Date: 07/28/2011 Time: 7829-5621 PT Time Calculation (min): 31 min  PT Assessment / Plan / Recommendation Comments on Treatment Session  Patient able to stand x3 today and ambulate. Patient with increased acitvity tolerance during todays session. Continue to recommend CIR due to patient motivation and rehab potential    Follow Up Recommendations  Inpatient Rehab;Supervision/Assistance - 24 hour    Barriers to Discharge        Equipment Recommendations  Defer to next venue    Recommendations for Other Services Rehab consult  Frequency Min 4X/week   Plan Discharge plan remains appropriate    Precautions / Restrictions Precautions Precautions: Fall Restrictions Weight Bearing Restrictions: No   Pertinent Vitals/Pain Denied pain    Mobility  Bed Mobility Supine to Sit: 4: Min assist Sitting - Scoot to Edge of Bed: 4: Min guard Details for Bed Mobility Assistance: A for position and for trunk control. Cues for technique Transfers Sit to Stand: 3: Mod assist;From bed;From chair/3-in-1;With upper extremity assist Stand to Sit: 3: Mod assist;With armrests;To chair/3-in-1 Details for Transfer Assistance: Cues for safe technique and hand placement. A to initiate stand and shift weight anteriorly over BOS. A to control descent into recliner/BSC. Patient with decrease hip/knee flexion with sitting. A and facilitation required for proper form with sitting.  Ambulation/Gait Ambulation/Gait Assistance: 4: Min assist Ambulation Distance (Feet): 120 Feet Assistive device: Rolling walker Ambulation/Gait Assistance Details: Patient able to ambulate 18ft with only one seated rest break. A reuired for safe use of RW and to increased step length with ambulation.  Gait Pattern: Narrow base of support;Shuffle;Trunk flexed;Decreased stride length;Step-through pattern    Exercises     PT  Diagnosis:    PT Problem List:   PT Treatment Interventions:     PT Goals Acute Rehab PT Goals PT Goal: Supine/Side to Sit - Progress: Progressing toward goal PT Goal: Sit to Stand - Progress: Progressing toward goal PT Goal: Stand to Sit - Progress: Progressing toward goal PT Transfer Goal: Bed to Chair/Chair to Bed - Progress: Progressing toward goal PT Goal: Ambulate - Progress: Progressing toward goal  Visit Information  Last PT Received On: 07/28/11 Assistance Needed: +2    Subjective Data      Cognition  Overall Cognitive Status: Appears within functional limits for tasks assessed/performed Arousal/Alertness: Awake/alert Orientation Level: Appears intact for tasks assessed Behavior During Session: [facility] for tasks performed    Balance     End of Session PT - End of Session Equipment Utilized During Treatment: Gait belt Activity Tolerance: Patient tolerated treatment well Patient left: in chair;with call bell/phone within reach Nurse Communication: Mobility status    Fredrich Birks 07/28/2011, 11:18 AM 07/28/2011 Fredrich Birks PTA [phone] pager [phone] office

## 2011-07-28 NOTE — Progress Notes (Signed)
Subjective: Patient reports doing well. Having more dyskenesias.  Has yet to do much walking.  Objective: Vital signs in last 24 hours: Temp:  [97.4 F (36.3 C)-98.6 F (37 C)] 97.4 F (36.3 C) (05/20 0542) Pulse Rate:  [77-103] 77  (05/20 0542) Resp:  [18-24] 22  (05/20 0542) BP: (96-203)/(56-111) 121/56 mmHg (05/20 0542) SpO2:  [94 %-99 %] 95 % (05/20 0542)  Intake/Output from previous day: 05/19 0701 - 05/20 0700 In: 120 [P.O.:120] Out: -  Intake/Output this shift: Total I/O In: 360 [P.O.:360] Out: -   Physical Exam: Dressings CDI.  Dyskinetic (one hour after medication).  Lab Results: No results found for this basename: WBC:2,HGB:2,HCT:2,PLT:2 in the last 72 hours BMET No results found for this basename: NA:2,K:2,CL:2,CO2:2,GLUCOSE:2,BUN:2,CREATININE:2,CALCIUM:2 in the last 72 hours  Studies/Results: No results found.  Assessment/Plan: Rehab would be an excellent option for Mr. Mazo.  I have spoken to him and his wife and they are both hopeful that this will be possible.  We will speak with Dr. Vickey Huger about changing his medications to decrease problems with dyskinesias.    LOS: 4 days    Dorian Heckle, MD 07/28/2011, 8:45 AM

## 2011-07-28 NOTE — PMR Pre-admission (Signed)
PMR Admission Coordinator Pre-Admission Assessment  Patient: [name] is an 78 y.o., male MRN: [id number] DOB: 23-Dec-1929 Height: 5' 11.5" (181.6 cm) Weight: 94.802 kg (209 lb)  Insurance Information HMO:     PPO:      PCP:      IPA:      80/20: yes     OTHER: no hmo PRIMARY: medicare      Policy#: [id number] a pt      Subscriber:  CM Name:       Phone#:      Fax#:  Pre-Cert#:       Employer:  Benefits:  Phone #: vision share     Name: 07/28/11 Eff. Date: a 01/08/94 and b 09/07/96     Deduct: $1184      Out of Pocket Max: none      Life Max: none CIR: 100%      SNF: [facility]BD 07/08/11 no full days, 60 co snf days. Admitted 5/15 so additional 15 days less of co snf days Outpatient: 80%     Co-Pay: 20% Home Health: 100%      Co-Pay: none DME: 80%     Co-Pay: 20% Providers: pt choice  SECONDARY: aarp supplement      Policy#: [id number]      Subscriber: pt CM Name:       Phone#:      Fax#:  Pre-Cert#:       Employer:  Benefits:  Phone #:      Name:  Eff. Date:      Deduct:       Out of Pocket Max:       Life Max:  CIR:       SNF:  Outpatient:      Co-Pay:  Home Health:       Co-Pay:  DME:      Co-Pay:   Medicaid Application Date:       Case Manager:  Disability Application Date:       Case Worker:   Emergency Contact Information Contact Information    Name Relation Home Work Mobile   Diosdado,Grace Spouse [phone]  [phone]     Current Medical History  Patient Admitting Diagnosis: Parkinson's disease with removal of DBS due to infection  History of Present Illness: . [name] is a 78 year old male known to CIR from prior stay for lumbar decompression (10/2009 d/c to SNF), Parkinson's diease with DBS who developed drainage from behind his left ear in march and has been on IV antibiotics since March 26th? Admitted on 05/20 for removal of DBS by Dr. Venetia Maxon. Post op on zyvox for 6 weeks per Dr. Zenaida Niece Dam's input. Noted to have increase in dyskinesias past DBS removal. Orthostatic symptoms  improving.    Past Medical History  Past Medical History  Diagnosis Date  . HYPERLIPIDEMIA-MIXED 02/10/2008  . PARKINSON'S DISEASE 02/10/2008  . HYPERTENSION, BENIGN 02/09/2009  . GASTROINTESTINAL HEMORRHAGE, HX OF 02/10/2008  . ANEMIA 04/29/2010  . FIBRILLATION, ATRIAL 04/29/2010  . VENTRAL HERNIA 04/29/2010  . DEGENERATIVE JOINT DISEASE 04/29/2010  . Gastric ulcer   . Positive PPD   . Internal hemorrhoids   . Hyperplastic colon polyp 2004  . CAD (coronary artery disease)     23.0 x 24 mm Taxus DES mid LAD 2005, DCA diagonal branch 2005  . VENOUS INSUFFICIENCY 02/10/2008    rt  . Shortness of breath   . Osteomyelitis   . Calculus of kidney   . Tuberculosis  78 yrs old  . GERD (gastroesophageal reflux disease)     ulcers hx  . Infection and inflammatory reaction due to device, implant, and graft   . Neuromuscular disorder   . Sundowning     past lumbar decompressive surgery    Family History  family history includes Crohn's disease in his daughter; Diabetes in his brother; Heart disease in his paternal grandfather; and Parkinsonism in his sister.  There is no history of Colon cancer.  Prior Rehab/Hospitalizations: SNF for past two weeks. Was at [facility] and then [facility] due to antibiotics and cost. CIR 2011 and discharged to snf  Current Medications  Current facility-administered medications:0.9 %  sodium chloride infusion, 250 mL, Intravenous, Continuous, Maeola Harman, MD;  acetaminophen (TYLENOL) suppository 650 mg, 650 mg, Rectal, Q4H PRN, Maeola Harman, MD;  acetaminophen (TYLENOL) tablet 650 mg, 650 mg, Oral, Q4H PRN, Maeola Harman, MD;  albuterol (PROVENTIL) (5 MG/ML) 0.5% nebulizer solution 2.5 mg, 2.5 mg, Nebulization, Q6H PRN, Maeola Harman, MD amantadine (SYMMETREL) capsule 100 mg, 100 mg, Oral, BID, Maeola Harman, MD, 100 mg at 07/28/11 1024;  aspirin EC tablet 81 mg, 81 mg, Oral, Daily, Maeola Harman, MD, 81 mg at 07/28/11 1023;  B-complex with vitamin C tablet 1  tablet, 1 tablet, Oral, Daily, Maeola Harman, MD, 1 tablet at 07/28/11 1023;  carbidopa-levodopa (SINEMET IR) 25-250 MG per tablet immediate release 1 tablet, 1 tablet, Oral, Custom, Maeola Harman, MD, 1 tablet at 07/28/11 0925 carbidopa-levodopa (SINEMET IR) 25-250 MG per tablet immediate release 2 tablet, 2 tablet, Oral, Q24H, Maeola Harman, MD, 2 tablet at 07/27/11 1414;  dextromethorphan-guaiFENesin (MUCINEX DM) 30-600 MG per 12 hr tablet 1 tablet, 1 tablet, Oral, Q12H, Maeola Harman, MD, 1 tablet at 07/28/11 1023;  docusate sodium (COLACE) capsule 100 mg, 100 mg, Oral, BID, Maeola Harman, MD, 100 mg at 07/28/11 1026 fluticasone (FLONASE) 50 MCG/ACT nasal spray 1 spray, 1 spray, Each Nare, Daily PRN, Maeola Harman, MD;  Fluticasone-Salmeterol (ADVAIR) 100-50 MCG/DOSE inhaler 1 puff, 1 puff, Inhalation, Q12H, Maeola Harman, MD, 1 puff at 07/28/11 0859;  HYDROcodone-acetaminophen (NORCO) 5-325 MG per tablet 1-2 tablet, 1-2 tablet, Oral, Q6H PRN, Maeola Harman, MD HYDROcodone-acetaminophen [facility]) 5-325 MG per tablet 1-2 tablet, 1-2 tablet, Oral, Q4H PRN, Maeola Harman, MD, 1 tablet at 07/24/11 1719;  ipratropium (ATROVENT) nebulizer solution 0.5 mg, 0.5 mg, Nebulization, Q6H PRN, Maeola Harman, MD;  linezolid (ZYVOX) tablet 600 mg, 600 mg, Oral, Q12H, Randall Hiss, MD, 600 mg at 07/28/11 1023;  meloxicam (MOBIC) tablet 15 mg, 15 mg, Oral, Daily, Maeola Harman, MD, 15 mg at 07/28/11 1023 menthol-cetylpyridinium (CEPACOL) lozenge 3 mg, 1 lozenge, Oral, PRN, Maeola Harman, MD;  midodrine (PROAMATINE) tablet 5 mg, 5 mg, Oral, BID WC, Maeola Harman, MD, 5 mg at 07/28/11 1025;  mupirocin ointment (BACTROBAN) 2 %, , Nasal, BID, Maeola Harman, MD;  ondansetron [facility]) injection 4 mg, 4 mg, Intravenous, Q4H PRN, Maeola Harman, MD oxyCODONE-acetaminophen (PERCOCET) 5-325 MG per tablet 1-2 tablet, 1-2 tablet, Oral, Q4H PRN, Maeola Harman, MD, 2 tablet at 07/27/11 2236;  pantoprazole (PROTONIX) EC tablet 40 mg, 40 mg, Oral,  Q1200, Maeola Harman, MD, 40 mg at 07/27/11 1145;  phenol (CHLORASEPTIC) mouth spray 1 spray, 1 spray, Mouth/Throat, PRN, Maeola Harman, MD;  polyethylene glycol (MIRALAX / GLYCOLAX) packet 17 g, 17 g, Oral, Daily PRN, Maeola Harman, MD pramipexole (MIRAPEX) tablet 0.5 mg, 0.5 mg, Oral, Custom, Maeola Harman, MD, 0.5 mg at 07/28/11 1024;  pramipexole (MIRAPEX) tablet 1 mg, 1 mg, Oral,  Q24H, Maeola Harman, MD, 1 mg at 07/27/11 1145;  pyridOXINE (VITAMIN B-6) tablet 50 mg, 50 mg, Oral, Daily, Randall Hiss, MD, 50 mg at 07/28/11 1023;  sodium chloride 0.9 % injection 3 mL, 3 mL, Intravenous, Q12H, Maeola Harman, MD, 3 mL at 07/27/11 1121 sodium chloride 0.9 % injection 3 mL, 3 mL, Intravenous, PRN, Maeola Harman, MD;  traMADol Janean Sark) tablet 50 mg, 50 mg, Oral, Q6H PRN, Maeola Harman, MD  Patients Current Diet: General  Precautions / Restrictions Precautions Precautions: Fall Restrictions Weight Bearing Restrictions: No   Prior Activity Level Household: limited Home Assistive Devices / Equipment Home Assistive Devices/Equipment: Systems developer (specify type);Cane (specify quad or straight) Home Adaptive Equipment: Wheelchair - manual;Walker - rolling;Tub transfer bench;Bedside commode/3-in-1  Prior Functional Level Prior Function Level of Independence: Needs assistance Needs Assistance: Dressing Dressing: Minimal (shoes and socks) Comments: Used RW and w/c around house.  Reports that since he has been at [facility], they have been doing all his ADLs.  Current Functional Level Cognition  Arousal/Alertness: Awake/alert Overall Cognitive Status: Appears within functional limits for tasks assessed/performed Orientation Level: Oriented X4    Extremity Assessment (includes Sensation/Coordination)  RUE ROM/Strength/Tone: Within functional levels RUE Sensation: WFL - Light Touch;WFL - Proprioception RUE Coordination: Deficits RUE Coordination Deficits: Tremors due to Parkinson's  RLE  ROM/Strength/Tone: Within functional levels RLE Sensation: WFL - Light Touch    ADLs  Eating/Feeding: Performed;Modified independent Where Assessed - Eating/Feeding: Bed level Toilet Transfer: Simulated;+2 Total assistance Toilet Transfer: Patient Percentage: 60% Toilet Transfer Method: Stand pivot Acupuncturist: Other (comment) (recliner) Toileting - Clothing Manipulation and Hygiene: Performed;Maximal assistance Where Assessed - Toileting Clothing Manipulation and Hygiene: Sit to stand from 3-in-1 or toilet Equipment Used: Gait belt;Rolling walker Transfers/Ambulation Related to ADLs: Verbally cued pt to take big steps and emphasize LE movement during transfer. ADL Comments: Max assist with clothing manipulation to thread LE's through undergarment and to pull undergarment up over hips.  Pt able to stand with mod assist and bil. UE holding urinal in place while voiding (min assist to steady hands).  Pt supporting LE's posteriorly against chair while standing to void.    Mobility  Bed Mobility: Sit to Supine Supine to Sit: 4: Min assist Sitting - Scoot to Edge of Bed: 4: Min guard Sit to Supine: 1: +2 Total assist Sit to Supine: Patient Percentage: 50%    Transfers  Transfers: Sit to Stand;Stand to Sit Sit to Stand: 3: Mod assist;From bed;From chair/3-in-1;With upper extremity assist Sit to Stand: Patient Percentage: 60% Stand to Sit: 3: Mod assist;With armrests;To chair/3-in-1 Stand to Sit: Patient Percentage: 60% Stand Pivot Transfers: 3: Mod assist    Ambulation / Gait / Stairs / Psychologist, prison and probation services  Ambulation/Gait Ambulation/Gait Assistance: 4: Min Environmental consultant (Feet): 120 Feet Assistive device: Rolling walker Ambulation/Gait Assistance Details: Patient able to ambulate 123ft with only one seated rest break. A reuired for safe use of RW and to increased step length with ambulation.  Gait Pattern: Narrow base of support;Shuffle;Trunk flexed;Decreased  stride length;Step-through pattern Gait velocity: decreased gait speed    Posture / Balance       Previous Home Environment Living Arrangements: Spouse/significant other Lives With: Spouse (SNF past two weeks) Type of Home: Apartment Home Layout: One level Home Access: Level entry Bathroom Shower/Tub: Tub/shower unit;Curtain Bathroom Toilet: Handicapped height Bathroom Accessibility: Yes How Accessible: Accessible via walker;Accessible via wheelchair Home Care Services: No  Discharge Living Setting Plans for Discharge Living Setting: Apartment;Lives with (comment) (spouse)  Type of Home at Discharge: Apartment Discharge Home Layout: One level Discharge Home Access: Level entry Discharge Bathroom Shower/Tub: Tub/shower unit Discharge Bathroom Toilet: Handicapped height Discharge Bathroom Accessibility: Yes How Accessible: Accessible via wheelchair;Accessible via walker Do you have any problems obtaining your medications?: No  Social/Family/Support Systems Patient Roles: Spouse;Parent Contact Information: Aeson Sawyers, wife Anticipated Caregiver: wife and dtr  Anticipated Caregiver's Contact Information: home [phone], cell [phone] Ability/Limitations of Caregiver: wife min assist 24/7, dtr prn Caregiver Availability: 24/7 Discharge Plan Discussed with Primary Caregiver: Yes Is Caregiver In Agreement with Plan?: Yes Does Caregiver/Family have Issues with Lodging/Transportation while Pt is in Rehab?: No  Goals/Additional Needs Patient/Family Goal for Rehab: Modi with PT, supervision to Min assist OT Expected length of stay: ELOS 7-10 days Special Service Needs: ON oral Zyvox per ID MD, may need financial assistance to obtain per MD due to cost Pt/Family Agrees to Admission and willing to participate: Yes Program Orientation Provided & Reviewed with Pt/Caregiver Including Roles  & Responsibilities: Yes  Patient Condition: This patient's condition remains as documented in  the Consult dated  07/28/11, in which the Rehabilitation Physician determined and documented that the patient's condition is appropriate for intensive rehabilitative care in an inpatient rehabilitation facility.  Preadmission Screen Completed By:  Clois Dupes, 07/28/2011 11:42 AM ______________________________________________________________________   Discussed status with Dr. Riley Kill on 07/28/11 at  1200 and received telephone approval for admission today.  Admission Coordinator:  Clois Dupes, time 5621 Date 07/28/11.

## 2011-07-28 NOTE — Clinical Social Work Placement (Signed)
    Clinical Social Work Department CLINICAL SOCIAL WORK PLACEMENT NOTE 07/28/2011  Patient:  [name], [name]  Account Number:  [id number] Admit date:  07/24/2011  Clinical Social Worker:  Peggyann Shoals  Date/time:  07/28/2011 11:00 AM  Clinical Social Work is seeking post-discharge placement for this patient at the following level of care:   SKILLED NURSING   (*CSW will update this form in Epic as items are completed)   07/28/2011  Patient/family provided with [facility] Department of Clinical Social Work's list of facilities offering this level of care within the geographic area requested by the patient (or if unable, by the patient's family).  07/28/2011  Patient/family informed of their freedom to choose among providers that offer the needed level of care, that participate in Medicare, Medicaid or managed care program needed by the patient, have an available bed and are willing to accept the patient.  07/28/2011  Patient/family informed of [facility]' ownership interest in [facility], as well as of the fact that they are under no obligation to receive care at this facility.  PASARR submitted to EDS on 07/28/2011 PASARR number received from EDS on 07/28/2011  FL2 transmitted to all facilities in geographic area requested by pt/family on  07/28/2011 FL2 transmitted to all facilities within larger geographic area on   Patient informed that his/her managed care company has contracts with or will negotiate with  certain facilities, including the following:     Patient/family informed of bed offers received:   Patient chooses bed at  Physician recommends and patient chooses bed at    Patient to be transferred to  on   Patient to be transferred to facility by   The following physician request were entered in Epic:   Additional Comments: Pt was admitted from [facility]. Per wife's request, she would like a search of SNF in [facility]. Pt has existing  PASARR.  CSW is signing off as pt is discharging to CIR today, 07/28/2011  Dede Query, MSW, Theresia Majors [phone]

## 2011-07-28 NOTE — Clinical Social Work Note (Signed)
CSW is singing off as pt will discharge to [facility]. Pt and pt's wife are agreeable to discharge plan. Please call if a need arises prior to discharge.   Dede Query, MSW, Theresia Majors [phone]

## 2011-07-28 NOTE — Discharge Summary (Signed)
Physician Discharge Summary  Patient ID: [name] MRN: [id number] DOB/AGE: [birth year] 78 y.o.  Admit date: 07/24/2011 Discharge date: 07/28/2011  Admission Diagnoses: Paralysis agitans   Discharge Diagnoses: Paralysis agitans s/p SUBTHALAMIC STIMULATOR REMOVAL   Active Problems:  * No active hospital problems. *    Discharged Condition: good  Hospital Course:  [name] was admitted for removal of Deep Brain Stimulator harware (leads, extensions, and implanted pulse generator) on 07/24/11. Following uncomplicated surgery, he recovered well in Neuro PACU and was transferred to 3000. Infectious Disease consulted and followed, having managed this patient as an outpatient since removal of his left chest IPG earlier this year d/t infection. Sinemet has been adjusted immediately pre-op based on Dr. Imagene Gurney (neurology) recommendations to Dr. Venetia Maxon in Dr. Oliva Bustard absence.  Dr. Oliva Bustard assistance will requested in continued medical management of [name]' Parkinsons.  Consults: ID and neurology  Significant Diagnostic Studies:   Treatments: surgery: SUBTHALAMIC STIMULATOR REMOVAL    Discharge Exam: Blood pressure 121/56, pulse 90, temperature 98.2 F (36.8 C), temperature source Oral, resp. rate 22, height 5' 11.5" (1.816 m), weight 94.802 kg (209 lb), SpO2 93.00%. Dressings intact, dry at scalp & left upper abdomen. Patient reports doing well. Having more dyskenesias.  Has yet to do much walking. Rehab would be an excellent option for [name].  Dr. Venetia Maxon has spoken to him and his wife and they are both hopeful that this will be possible.  We will speak with Dr. Vickey Huger about changing his medications to decrease problems with dyskinesias. Clarification of Parkinson's meds per Dr. Venetia Maxon:                                     6am     9am     12noon     3pm           6pm Sinemet 25/250          1tab     1tab     1tab          2tabs          1tab Mirapex 0.5mg            1tab      1tab     2tabs        1tab            1tab      Amantadine 100mg  1cap BID   Re: Infectious Disease plan with Zyvox: deep brain similar infection: The device has been removed intraoperative cultures are pending. He did grow coagulase-negative staphylococcal species in the past.  we would consider changing him to oral Zyvox which would broadly cover for methicillin-resistant Staphylococcus aureus as well as a coagulase negative staph species and which he could take by mouth. This drug however is very expensive itself. There is a patient assistance program and this could be accessed eye case management to see if the cost could be less prohibitive. Alternatively one could go with doxycycline and which would also cover his coagulase-negative staph as well as MRSA and methicillin sensitive staph aureus. It would be less active and affect efficacious against streptococcal species. For now I will go ahead and make the change to Zyvox and put in a consult to case management to see if they can obtain assistance for this antibiotic for this patient. I would prefer to treat him for another month to 6  weeks after removal of the deep brain stimulator to ensure eradication of the infection.   "zyvox assist program" phone number [phone]    Disposition: 62-Rehab Facility - Inpatient Rehab  Discharge Orders    Future Appointments: Provider: Department: Dept Phone: Center:   08/07/2011 10:00 AM Gardiner Barefoot, MD [facility] [phone] [facility]     Medication List  As of 07/28/2011  1:06 PM   TAKE these medications         amantadine 100 MG capsule   Commonly known as: SYMMETREL   Take 100 mg by mouth 2 (two) times daily.      aspirin EC 81 MG tablet   Take 81 mg by mouth daily.      B COMPLEX PO   Take 1 tablet by mouth 2 (two) times daily.      carbidopa-levodopa 25-250 MG per tablet   Commonly known as: SINEMET IR   Take 1 tablet by mouth 4 (four) times daily. 1 tablet at 6AM, 9AM, 12noon,  and 6PM      carbidopa-levodopa 25-250 MG per tablet   Commonly known as: SINEMET IR   Take 2 tablets by mouth daily. 2 tablets at 3PM      dextromethorphan-guaiFENesin 30-600 MG per 12 hr tablet   Commonly known as: MUCINEX DM   Take 1 tablet by mouth every 12 (twelve) hours.      fluticasone 50 MCG/ACT nasal spray   Commonly known as: FLONASE   Place 1 spray into the nose daily as needed. For dry nasal passages      Fluticasone-Salmeterol 100-50 MCG/DOSE Aepb   Commonly known as: ADVAIR   Inhale 1 puff into the lungs every 12 (twelve) hours.      HYDROcodone-acetaminophen 5-325 MG per tablet   Commonly known as: NORCO   Take 1-2 tablets by mouth every 6 (six) hours as needed. 1 tablet for moderate pain, 2 tablets for severe pain.      ipratropium-albuterol 0.5-2.5 (3) MG/3ML Soln   Commonly known as: DUONEB   Take 3 mLs by nebulization every 6 (six) hours as needed. Shortness of breath      meloxicam 15 MG tablet   Commonly known as: MOBIC   Take 15 mg by mouth daily.      midodrine 5 MG tablet   Commonly known as: PROAMATINE   Take 5 mg by mouth 2 (two) times daily.      omeprazole 40 MG capsule   Commonly known as: PRILOSEC   Take 1 capsule (40 mg total) by mouth daily.      PARoxetine 20 MG tablet   Commonly known as: PAXIL   Take 1 tablet (20 mg total) by mouth every morning.      polyethylene glycol packet   Commonly known as: MIRALAX / GLYCOLAX   Take 17 g by mouth daily as needed. For constipation      pramipexole 1 MG tablet   Commonly known as: MIRAPEX   Take 0.5 mg by mouth 4 (four) times daily. 1/2 tablet at 6AM, 9AM, 3PM and 6PM      pramipexole 1 MG tablet   Commonly known as: MIRAPEX   Take 1 mg by mouth daily. 1 tablet at 12noon      PROBIOTIC FORMULA PO   Take 1 capsule by mouth daily.      TEFLARO 600 MG Solr   Generic drug: ceftaroline   Inject 600 mg into the vein every 12 (twelve)  hours.             Signed: Georgiann Cocker 07/28/2011, 1:06 PM

## 2011-07-28 NOTE — Care Management Note (Addendum)
    Page 1 of 1   07/29/2011     1:20:28 PM   CARE MANAGEMENT NOTE 07/29/2011  Patient:  [name], [name]   Account Number:  [id number]  Date Initiated:  07/28/2011  Documentation initiated by:  [facility]  Subjective/Objective Assessment:   Admitted with deep brain stimulator infection.     Action/Plan:   inpatient rehab   Anticipated DC Date:  07/28/2011   Anticipated DC Plan:  IP REHAB FACILITY      DC Planning Services  CM consult  Medication Assistance      Choice offered to / List presented to:             Status of service:  Completed, signed off Medicare Important Message given?   (If response is "NO", the following Medicare IM given date fields will be blank) Date Medicare IM given:   Date Additional Medicare IM given:    Discharge Disposition:  IP REHAB FACILITY  Per UR Regulation:  Reviewed for med. necessity/level of care/duration of stay  If discussed at Long Length of Stay Meetings, dates discussed:    Comments:  07/29/11 Received call from [place] at Zyvox Assist program at 5:05 pm on 07/28/11. He stated that Zyvox would cost the patient over $2500 for 30 days. They will need to know the # of people in patient's househoild and annual income. Once they have this information called into them they should be able to determine eligibility in a day or 2.Contacted Lucy CM at [facility] and gave her this information. Valentina Gu will ontain needed patient information and call it in to the asststance program.  Jacquelynn Cree RN, BSN, CCM   07/28/11 Spoke with patient regarding zyvox assistance program and received his permission to contact the patient assistance program. Jeanene Erb [phone] and spoke with [facility], he will call back and let me know how much zyvox will cost patient and the next step. Will continue to follow. Jacquelynn Cree RN, BSN, CCM

## 2011-07-28 NOTE — Progress Notes (Signed)
I met with patient and his wife at bedside. Both are in agreement to CIR admit rather than SNF. I will place call to Dr. Venetia Maxon to clarify if I can admit pt to CIR today. [phone]

## 2011-07-28 NOTE — Clinical Social Work Note (Signed)
LATE ENTRY:  CSW met with pt and wife on Thrusday, 07/24/2011 at 4:30 PM. Brief psychosocial assessment to follow. CSW will continue to follow.   Dede Query, MSW, Theresia Majors [phone]

## 2011-07-28 NOTE — Clinical Social Work Psychosocial (Signed)
     Clinical Social Work Department BRIEF PSYCHOSOCIAL ASSESSMENT 07/28/2011  Patient:  [name], [name]     Account Number:  [id number]     Admit date:  07/24/2011  Clinical Social Worker:  Peggyann Shoals  Date/Time:  07/28/2011 11:00 AM  Referred by:  Physician  Date Referred:  07/24/2011 Referred for  SNF Placement   Other Referral:   Interview type:  Family Other interview type:    PSYCHOSOCIAL DATA Living Status:  WIFE Admitted from facility:  [facility] Level of care:  Skilled Nursing Facility Primary support name:  [name] Primary support relationship to patient:  SPOUSE Degree of support available:   Supportive.    CURRENT CONCERNS Current Concerns  Post-Acute Placement   Other Concerns:    SOCIAL WORK ASSESSMENT / PLAN CSW met with pt's wife to address consult. PT/OT are recommending CIR and CIR is following. Pt was admitted from [facility]. Pt's wife shared that she would like for pt to go to a different SNF if [facility] does not accept pt. Pt's wife is agreeable to a SNF search for [facility]. CSW will initate SNF search and follow up with bed offers. CSW will continue to follow.   Assessment/plan status:  Psychosocial Support/Ongoing Assessment of Needs Other assessment/ plan:   Information/referral to community resources:   As needed.    PATIENTS/FAMILYS RESPONSE TO PLAN OF CARE: Pt and pt's wife were very pleasant and agreeable to discharge plan.

## 2011-07-29 DIAGNOSIS — A4189 Other specified sepsis: Secondary | ICD-10-CM

## 2011-07-29 DIAGNOSIS — G20 Parkinson's disease: Secondary | ICD-10-CM

## 2011-07-29 DIAGNOSIS — R5381 Other malaise: Secondary | ICD-10-CM

## 2011-07-29 DIAGNOSIS — Z5189 Encounter for other specified aftercare: Secondary | ICD-10-CM

## 2011-07-29 DIAGNOSIS — R269 Unspecified abnormalities of gait and mobility: Secondary | ICD-10-CM

## 2011-07-29 LAB — DIFFERENTIAL
Eosinophils Absolute: 0.7 10*3/uL (ref 0.0–0.7)
Eosinophils Relative: 9 % — ABNORMAL HIGH (ref 0–5)
Lymphs Abs: 1.3 10*3/uL (ref 0.7–4.0)
Monocytes Relative: 11 % (ref 3–12)

## 2011-07-29 LAB — ANAEROBIC CULTURE

## 2011-07-29 LAB — BASIC METABOLIC PANEL
BUN: 42 mg/dL — ABNORMAL HIGH (ref 6–23)
Calcium: 9.4 mg/dL (ref 8.4–10.5)
Creatinine, Ser: 1.86 mg/dL — ABNORMAL HIGH (ref 0.50–1.35)
GFR calc non Af Amer: 33 mL/min — ABNORMAL LOW (ref >90)
Glucose, Bld: 124 mg/dL — ABNORMAL HIGH (ref 70–99)

## 2011-07-29 LAB — CARDIAC PANEL(CRET KIN+CKTOT+MB+TROPI)
CK, MB: 6.1 ng/mL (ref 0.3–4.0)
Troponin I: <0.3 ng/mL (ref <0.30)

## 2011-07-29 LAB — COMPREHENSIVE METABOLIC PANEL
ALT: <5 U/L (ref 0–53)
Albumin: 3.6 g/dL (ref 3.5–5.2)
Alkaline Phosphatase: 101 U/L (ref 39–117)
Chloride: 102 mEq/L (ref 96–112)
Glucose, Bld: 95 mg/dL (ref 70–99)
Potassium: 4.4 mEq/L (ref 3.5–5.1)
Sodium: 137 mEq/L (ref 135–145)
Total Protein: 7.9 g/dL (ref 6.0–8.3)

## 2011-07-29 LAB — CBC
Hemoglobin: 11.7 g/dL — ABNORMAL LOW (ref 13.0–17.0)
MCHC: 32.6 g/dL (ref 30.0–36.0)
WBC: 7.5 10*3/uL (ref 4.0–10.5)

## 2011-07-29 MED ORDER — PRAMIPEXOLE DIHYDROCHLORIDE 0.25 MG PO TABS
0.5000 mg | ORAL_TABLET | ORAL | Status: DC
  Filled 2011-07-29 (×3): qty 2

## 2011-07-29 MED ORDER — CARBIDOPA-LEVODOPA 25-250 MG PO TABS
2.0000 | ORAL_TABLET | ORAL | Status: DC
  Administered 2011-07-30 – 2011-08-13 (×15): 2 via ORAL
  Filled 2011-07-29 (×16): qty 2

## 2011-07-29 MED ORDER — CARBIDOPA-LEVODOPA 25-250 MG PO TABS
1.0000 | ORAL_TABLET | Freq: Three times a day (TID) | ORAL | Status: DC
  Administered 2011-07-29: 1 via ORAL
  Filled 2011-07-29 (×2): qty 1

## 2011-07-29 MED ORDER — PRAMIPEXOLE DIHYDROCHLORIDE 1 MG PO TABS
1.0000 mg | ORAL_TABLET | ORAL | Status: DC
  Filled 2011-07-29: qty 1

## 2011-07-29 MED ORDER — PRAMIPEXOLE DIHYDROCHLORIDE 0.25 MG PO TABS
0.5000 mg | ORAL_TABLET | Freq: Every day | ORAL | Status: DC
  Administered 2011-07-29 – 2011-08-08 (×50): 0.5 mg via ORAL
  Filled 2011-07-29 (×54): qty 2

## 2011-07-29 MED ORDER — CARBIDOPA-LEVODOPA 25-250 MG PO TABS
1.0000 | ORAL_TABLET | Freq: Four times a day (QID) | ORAL | Status: DC
  Administered 2011-07-29 – 2011-08-14 (×64): 1 via ORAL
  Filled 2011-07-29 (×70): qty 1

## 2011-07-29 MED ORDER — SODIUM CHLORIDE 0.9 % IV SOLN
INTRAVENOUS | Status: DC
  Administered 2011-07-29 – 2011-07-31 (×3): via INTRAVENOUS

## 2011-07-29 MED ORDER — CARBIDOPA-LEVODOPA 25-250 MG PO TABS: 1.0000 | ORAL_TABLET | Freq: Four times a day (QID) | ORAL | Status: DC

## 2011-07-29 MED ORDER — MIDODRINE HCL 5 MG PO TABS
5.0000 mg | ORAL_TABLET | Freq: Two times a day (BID) | ORAL | Status: DC
  Administered 2011-07-30 – 2011-08-14 (×32): 5 mg via ORAL
  Filled 2011-07-29 (×35): qty 1

## 2011-07-29 NOTE — Progress Notes (Signed)
Brief Nutrition Note  RD drawn to chart 2/2 Nutrition Risk Report. Pt denies difficulty chewing or swallowing.  Current weight is 187 lb. Body mass index is 24.78 kg/(m^2). Weight is WNL.   Pt is eating 100% of Regular meals.  Pt was started on Zyvox medication on 5/20. Per chart review, pt to be on this medication for 6 weeks post-op.  RD contacted PA, Marissa Nestle regarding pt's need for low tyramine diet during use of zyvox medication. PA in agreement of changing current diet order to Low Tyramine.  RD provided diet education re: tyramine-restricted diet. Handout from [employer] provided listing foods to avoid. RD discussed diet change and education with RN on shift.  Nutrition dx: Food- and nutrition-related knowledge deficit r/t lack of prior diet education AEB new medication and need for specialty diet.  Goal: Verbalize basic understanding of low tyramine diet. Met.  Plan: Change current diet order to Low-Tyramine Diet.  Re-consult RD for any additional nutrition needs.  Adair Laundry Pager#: [phone]

## 2011-07-29 NOTE — Progress Notes (Signed)
Occupational Therapy Session Note  Patient Details  Name: [name] MRN: [id number] Date of Birth: 14-Nov-1929  Today's Date: 07/29/2011 Time: 0102-7253 Time Calculation (min): 30 min  Short Term Goals: Week 1:  OT Short Term Goal 1 (Week 1): Pt will perform sit to stands with min A for transfers and toileting OT Short Term Goal 2 (Week 1): Pt will don underwear and pants with mod A OT Short Term Goal 3 (Week 1): Pt will transfer to toilet/ BSC with min A OT Short Term Goal 4 (Week 1): Pt will perform toileting with max A  Skilled Therapeutic Interventions/Progress Updates:    1:1 self care retraining focusing on functional ambulation to the bathroom, performing sit to stands with min to mod A, standing balance with 1 UE support to perform toileting tasks (with max A). Engaged in a card game to assess [name]- able to manipulate cards and play the game despite UB movements.  Therapy Documentation Precautions:  Precautions Precautions: Fall Precaution Comments: TED hose and abdominal binder when OOB Restrictions Weight Bearing Restrictions: No Pain: Pain Assessment Pain Assessment: No/denies pain  See FIM for current functional status  Therapy/Group: Individual Therapy  Roney Mans [facility] 07/29/2011, 3:41 PM

## 2011-07-29 NOTE — Progress Notes (Signed)
Delle Reining PA notified of critical value of CKMB 6.1 for Mr. Cofer @ [phone].  PA Delle Reining requested troponin levels; Level <0.30 per lab.  PA Delle Reining verbalized no new orders.

## 2011-07-29 NOTE — Progress Notes (Signed)
Patient with syncopal episode when transferring to commode.  Complained of feeling lightheaded and then got diaphoretic and syncopal. Back in bed with some improvement in dizziness.  Noted to be dehydrated on am labs.  Difficult to get BP.  HR at 54 and irregular.   Will get IVF started for hydration.  Schedule midodrine for 6 am to help avoid symptoms when up.  EKG to rule out cardiac event. Add TEDs and Binder for Shy Drager's symptoms.

## 2011-07-29 NOTE — Progress Notes (Signed)
CRITICAL VALUE ALERT  Critical value received:  CKMB 6.1  Date of notification:  07/29/11  Time of notification:  1800   Critical value read back:yes  Nurse who received alert:  Jim Desanctis    MD notified (1st page):  Delle Reining PA    Time of first page:  1810    MD notified (2nd page):  Time of second page:  Responding MD:  Delle Reining PA  Time MD responded:  [phone]

## 2011-07-29 NOTE — Progress Notes (Signed)
Physical Therapy Note  Patient Details  Name: [name] MRN: [id number] Date of Birth: 08-11-1929 Today's Date: 07/29/2011  Time: 1130-1205 35 minutes  No c/o pain . Stair training 3 steps with mod A, cues for technique and safety, increased assist for eccentric control to descend steps.  Sit <> stand training with pt pushing up with both UEs on armrests vs pulling on RW.  Pt able to stand with min-mod A, increasing assist needed when fatigued.  Pt required min A for steadying on standing due to posterior lean.  W/c mobility 100' with supervision, slow speed, cues to use larger arm motions.  Pt with improved tolerance to activity with only 1 c/o dizziness on standing, eased with seated rest.  Individual therapy   Laren Orama 07/29/2011, 12:08 PM

## 2011-07-29 NOTE — Progress Notes (Signed)
Dr. Sandria Manly called back to get input regarding patient's symptoms.  Relayed that dyskinesias were better today.  He recommended decreasing Mirapex to 0.5 mg five times a day (to avoid SE of dyskinesias) and limiting protein at breakfast and lunch with high protein meal at dinner. To continue sinemet at current schedule.

## 2011-07-29 NOTE — Progress Notes (Signed)
Patient ID: [name], male   DOB: 02-Jan-1930, 78 y.o.   MRN: [id number] Subjective/Complaints: Slept fairly well. Anxious to get to therapy. Denies pain. bp elevated somewhat Other ROS reviewed and negative  Objective: Vital Signs: Blood pressure 170/97, pulse 94, temperature 97.2 F (36.2 C), temperature source Oral, resp. rate 18, height 6\' 1"  (1.854 m), weight 85.2 kg (187 lb 13.3 oz), SpO2 100.00%. No results found.  Basename 07/29/11 0619  WBC 7.5  HGB 11.7*  HCT 35.9*  PLT 251    Basename 07/29/11 0619  NA 137  K 4.4  CL 102  CO2 23  GLUCOSE 95  BUN 40*  CREATININE 1.73*  CALCIUM 9.3   CBG (last 3)  No results found for this basename: GLUCAP:3 in the last 72 hours  Wt Readings from Last 3 Encounters:  07/28/11 85.2 kg (187 lb 13.3 oz)  07/24/11 94.802 kg (209 lb)  07/24/11 94.802 kg (209 lb)    Physical Exam:  General appearance: alert, cooperative and no distress Head: Normocephalic, without obvious abnormality, atraumatic Eyes: conjunctivae/corneas clear. PERRL, EOM's intact. Fundi benign. Ears: normal TM's and external ear canals both ears Nose: Nares normal. Septum midline. Mucosa normal. No drainage or sinus tenderness. Throat: lips, mucosa, and tongue normal; teeth and gums normal Neck: no adenopathy, no carotid bruit, no JVD, supple, symmetrical, trachea midline and thyroid not enlarged, symmetric, no tenderness/mass/nodules Back: symmetric, no curvature. ROM normal. No CVA tenderness. Resp: clear to auscultation bilaterally Cardio: regular rate and rhythm, S1, S2 normal, no murmur, click, rub or gallop GI: soft, non-tender; bowel sounds normal; no masses,  no organomegaly Extremities: extremities normal, atraumatic, no cyanosis or edema Pulses: 2+ and symmetric Skin: Skin color, texture, turgor normal. No rashes or lesions Neurologic: dysarthric speech, masked facies,forward posture. Intentional tremors and dyskinesias noted. Fair awareness and  insight.  Incision/Wound: wounds clean and intact with staples   Assessment/Plan: 1. Functional deficits secondary to Parkinson's Disease s/p removal of deep brain stimulator due to infection which require 3+ hours per day of interdisciplinary therapy in a comprehensive inpatient rehab setting. Physiatrist is providing close team supervision and 24 hour management of active medical problems listed below. Physiatrist and rehab team continue to assess barriers to discharge/monitor patient progress toward functional and medical goals. FIM:                   Comprehension Comprehension Mode: Auditory Comprehension: 5-Follows basic conversation/direction: With extra time/assistive device  Expression Expression Mode: Verbal Expression: 5-Expresses basic 90% of the time/requires cueing < 10% of the time.  Social Interaction Social Interaction: 5-Interacts appropriately 90% of the time - Needs monitoring or encouragement for participation or interaction.  Problem Solving Problem Solving: 5-Solves basic 90% of the time/requires cueing < 10% of the time  Memory Memory: 4-Recognizes or recalls 75 - 89% of the time/requires cueing 10 - 24% of the time 1. DVT Prophylaxis/Anticoagulation: Mechanical: Sequential compression devices, below knee Bilateral lower extremities  2. Pain Management: prn tylenol for now to avoid neuro/sedating SE. History of delirium past back surgery. Likely chronic dementia given dx. 3. Mood: monitor for now. Off paxil to avoid SE as on Zyvox. LCSW to follow up for evaluation.  4. Parkinson's disease: Will continue mirapex, sinemet and amantadine at home schedule- family had requested neuro to comment on meds.  - Continue midodrine for orthostasis. Will schedule medications at 7 am and noon to help maintain BP with activity/therapies. May have to tolerate some elevation in his  resing bp. 5. Staph infection: Zyvox D # 5/42. Afebrile. Wbc 7.5 6. Constipation: Will  schedule miralax daily.  7. ?COPD/reactive airway disease: Will continue advair bid as at home.   LOS (Days) 1 A FACE TO FACE EVALUATION WAS PERFORMED  Humbert Morozov T 07/29/2011, 8:09 AM

## 2011-07-29 NOTE — Care Management (Signed)
Inpatient Rehabilitation Center Individual Statement of Services  Patient Name:  [name]  Date:  07/29/2011  Welcome to the Inpatient Rehabilitation Center.  Our goal is to provide you with an individualized program based on your diagnosis and situation, designed to meet your specific needs.  With this comprehensive rehabilitation program, you will be expected to participate in at least 3 hours of rehabilitation therapies Monday-Friday, with modified therapy programming on the weekends.  Your rehabilitation program will include the following services:  Physical Therapy (PT), Occupational Therapy (OT), 24 hour per day rehabilitation nursing, Therapeutic Recreaction (TR), Case Management (RN and Child psychotherapist), Rehabilitation Medicine, Nutrition Services and Pharmacy Services  Weekly team conferences will be held on  Tuesday  to discuss your progress.  Your RN Case Designer, television/film set will talk with you frequently to get your input and to update you on team discussions.  Team conferences with you and your family in attendance may also be held.  Expected length of stay: 2 weeks                                                                                                                             Overall anticipated outcome: Supervision-Min Assist  Depending on your progress and recovery, your program may change.  Your RN Case Estate agent will coordinate services and will keep you informed of any changes.  Your RN Sports coach and SW names and contact numbers are listed  below.  The following services may also be recommended but are not provided by the Inpatient Rehabilitation Center:   Driving Evaluations  Home Health Rehabiltiation Services  Outpatient Rehabilitatation [facility]  Vocational Rehabilitation   Arrangements will be made to provide these services after discharge if needed.  Arrangements include referral to agencies that provide these services.  Your  insurance has been verified to be:  Medicare + AARP Your primary doctor is:  Dr. Evelena Peat  Pertinent information will be shared with your doctor and your insurance company.  Case Manager: Melanee Spry, [facility] [phone]  Social Worker:  [place], [place] [phone]  Information discussed with and copy given to patient by: [name], 07/29/2011, 12:43 PM

## 2011-07-29 NOTE — Evaluation (Addendum)
Occupational Therapy Assessment and Plan  Patient Details  Name: [name] MRN: [id number] Date of Birth: 1929/06/06  OT Diagnosis: muscle weakness (generalized) Rehab Potential:   ELOS: 2weeks   Today's Date: 07/29/2011 Time: 1030-1130 Time Calculation (min): 60 min  Problem List:  Patient Active Problem List  Diagnoses  . HYPERLIPIDEMIA-MIXED  . PARKINSON'S DISEASE  . HYPERTENSION, BENIGN  . Coronary Atherosclerosis of Native Coronary Artery  . VENOUS INSUFFICIENCY  . Shortness of breath  . GASTROINTESTINAL HEMORRHAGE, HX OF  . ANEMIA  . FIBRILLATION, ATRIAL  . UMBILICAL HERNIA  . VENTRAL HERNIA  . DEGENERATIVE JOINT DISEASE  . GASTRIC ULCER, HX OF  . COLONIC POLYPS, HYPERPLASTIC, HX OF  . History of depression  . Dyspnea  . Skin infection, bacterial  . Sundowning    Past Medical History:  Past Medical History  Diagnosis Date  . HYPERLIPIDEMIA-MIXED 02/10/2008  . PARKINSON'S DISEASE 02/10/2008  . HYPERTENSION, BENIGN 02/09/2009  . GASTROINTESTINAL HEMORRHAGE, HX OF 02/10/2008  . ANEMIA 04/29/2010  . FIBRILLATION, ATRIAL 04/29/2010  . VENTRAL HERNIA 04/29/2010  . DEGENERATIVE JOINT DISEASE 04/29/2010  . Gastric ulcer   . Positive PPD   . Internal hemorrhoids   . Hyperplastic colon polyp 2004  . CAD (coronary artery disease)     23.0 x 24 mm Taxus DES mid LAD 2005, DCA diagonal branch 2005  . VENOUS INSUFFICIENCY 02/10/2008    rt  . Shortness of breath   . Osteomyelitis   . Calculus of kidney   . Tuberculosis     78 yrs old  . GERD (gastroesophageal reflux disease)     ulcers hx  . Infection and inflammatory reaction due to device, implant, and graft   . Neuromuscular disorder   . Sundowning     past lumbar decompressive surgery   Past Surgical History:  Past Surgical History  Procedure Date  . Replacement total knee 2002    right  . Nose surgery   . Ankle surgery     left  . Toe amputation     right foot  . Cataract extraction   . Carpal tunnel  release     left, open rt  . Tonsillectomy   . Appendectomy   . Coronary angioplasty with stent placement   . Back surgery 2006     back x3  . Deep brain stimulator placement 02    batteries changed 07  . Subthalamic stimulator battery replacement 02/27/2011    Procedure: SUBTHALAMIC STIMULATOR BATTERY REPLACEMENT;  Surgeon: Dorian Heckle, MD;  Location: [facility];  Service: Neurosurgery;  Laterality: Left;  DBS battery replacment x 2 - Left Chest and Abdomen  . Eye muscle surgery     bilat  . Subthalamic stimulator removal 05/22/2011    Procedure: SUBTHALAMIC STIMULATOR REMOVAL;  Surgeon: Maeola Harman, MD;  Location: [facility];  Service: Neurosurgery;  Laterality: N/A;  removal of IPG  . Subthalamic stimulator removal 07/24/2011    Procedure: SUBTHALAMIC STIMULATOR REMOVAL;  Surgeon: Maeola Harman, MD;  Location: [facility];  Service: Neurosurgery;  Laterality: N/A;  Deep Brain stimulator hardware removal  . Foot amputation through metatarsal     right    Assessment & Plan Clinical Impression: Patient is a 78 y.o. year old male known to [facility] from prior stay for lumbar decompression (10/2009 d/c to SNF), Parkinson's diease with DBS who developed drainage from behind his left ear in march and has been on IV antibiotics since March 26th? Admitted on  05/20 for removal of DBS by Dr. Venetia Maxon. Post op on zyvox for 6 weeks per Dr. Zenaida Niece Dam's input. Noted to have increase in dyskinesias past DBS removal. Orthostatic symptoms improving. Patient noted to be deconditioned.   Patient transferred to [facility] on 07/28/2011 .    Patient currently requires min- total A with basic self-care skills secondary to muscle weakness, decreased cardiorespiratoy endurance and orthostatic BP and decreased sitting balance, decreased standing balance, decreased postural control and decreased balance strategies.  Prior to hospitalization, patient could complete ADL with supervision to independent.  Patient will benefit from  skilled intervention to increase independence with basic self-care skills prior to discharge home with care partner.  Anticipate patient will require 24 hour supervision and follow up home health.  OT - End of Session Activity Tolerance: Tolerates < 10 min activity with changes in vital signs (needed  to don TED hose prior to standing) OT Plan OT Frequency: 1-2 X/day, 60-90 minutes Estimated Length of Stay: 2weeks OT Treatment/Interventions: Balance/vestibular training;Functional mobility training;Self Care/advanced ADL retraining;Therapeutic Exercise;UE/LE Strength taining/ROM;Neuromuscular re-education;DME/adaptive equipment instruction;Cognitive remediation/compensation;Community reintegration;Discharge planning;Patient/family education;UE/LE Coordination activities;Therapeutic Activities;Psychosocial support OT Recommendation Follow Up Recommendations: Home health OT  OT Evaluation Precautions/Restrictions  Precautions Precautions: Fall Restrictions Weight Bearing Restrictions: No General Chart Reviewed: Yes Family/Caregiver Present: No Vital Signs   Pain Pain Assessment Pain Assessment: No/denies pain Home Living/Prior Functioning Home Living Lives With: Spouse Type of Home: Apartment Home Access: Level entry Home Layout: One level Bathroom Shower/Tub: Engineer, manufacturing systems: Handicapped height Bathroom Accessibility: Yes How Accessible: Accessible via wheelchair;Accessible via walker Home Adaptive Equipment: Bedside commode/3-in-1;Tub transfer bench;Wheelchair - manual;Walker - rolling ADL ADL Grooming: Minimal assistance Upper Body Bathing: Minimal assistance Lower Body Bathing: Dependent Upper Body Dressing: Minimal assistance Lower Body Dressing: Dependent Toileting: Dependent Toilet Transfer: Maximal assistance Vision/Perception  Vision - History Baseline Vision: Wears glasses only for reading Patient Visual Report: No change from baseline Vision -  Assessment Eye Alignment: Within Functional Limits Perception Perception: Within Functional Limits Praxis Praxis: Intact  Cognition Overall Cognitive Status: Appears within functional limits for tasks assessed Arousal/Alertness: Awake/alert Orientation Level: Oriented X4 Safety/Judgment: Appears intact Sensation Sensation Light Touch: Impaired Detail Light Touch Impaired Details: Impaired RUE;Impaired LUE (hands/fingers numb) Coordination Gross Motor Movements are Fluid and Coordinated: No Fine Motor Movements are Fluid and Coordinated: No Coordination and Movement Description: increased  dyskinesias Motor  Motor Motor: Abnormal postural alignment and control Motor - Skilled Clinical Observations: increased dyskinesias Mobility  Bed Mobility Supine to Sit: 4: Min assist Transfers Sit to Stand: 3: Mod assist Stand to Sit: 3: Mod assist  Trunk/Postural Assessment  Cervical Assessment Cervical Assessment: Exceptions to [facility] (forward flexion) Postural Control Postural Control: Deficits on evaluation (posterior pelvic tilt)  Balance Balance Balance Assessed: Yes Static Sitting Balance Static Sitting - Level of Assistance: 4: Min assist Static Sitting - Comment/# of Minutes: cuing for forward flexion Dynamic Sitting Balance Dynamic Sitting - Level of Assistance: 4: Min assist Static Standing Balance Static Standing - Level of Assistance: 4: Min assist;3: Mod assist Extremity/Trunk Assessment RUE Assessment RUE Assessment: Within Functional Limits LUE Assessment LUE Assessment: Within Functional Limits  See FIM for current functional status Refer to Care Plan for Long Term Goals  Recommendations for other services: None  Discharge Criteria: Patient will be discharged from OT if patient refuses treatment 3 consecutive times without medical reason, if treatment goals not met, if there is a change in medical status, if patient makes no progress towards goals or if  patient  is discharged from hospital.  The above assessment, treatment plan, treatment alternatives and goals were discussed and mutually agreed upon: by patient  1:1 OT eval initiated; OT role, purpose and goals discussed. Self care retraining: focused on bed mobility to get to EOB, maintaining sitting balance while donning TED hose, sit to stand, short ambulation with RW from bed to w/c with mod A, standing balance at sink with bilateral UE support and unilateral support for clothing management and pericare   Roney Mans [facility] 07/29/2011, 11:37 AM

## 2011-07-29 NOTE — Progress Notes (Signed)
Patient information reviewed and entered into [employer] system by Morissa Obeirne, RN, CRRN, PPS Coordinator.  Information including medical coding and functional independence measure will be reviewed and updated through discharge.     Per nursing patient was given "Data Collection Information Summary for Patients in Inpatient Rehabilitation Facilities with attached "Privacy Act Statement-Health Care Records" upon admission.   

## 2011-07-29 NOTE — Progress Notes (Signed)
Recreational Therapy Assessment and Plan  Patient Details  Name: [name] MRN: [id number] Date of Birth: 04/22/1929 Today's Date: 07/29/2011  Rehab Potential: Good ELOS: 2 weeks   Assessment Clinical Impression: Problem List:  Patient Active Problem List   Diagnoses   .  HYPERLIPIDEMIA-MIXED   .  PARKINSON'S DISEASE   .  HYPERTENSION, BENIGN   .  Coronary Atherosclerosis of Native Coronary Artery   .  VENOUS INSUFFICIENCY   .  Shortness of breath   .  GASTROINTESTINAL HEMORRHAGE, HX OF   .  ANEMIA   .  FIBRILLATION, ATRIAL   .  UMBILICAL HERNIA   .  VENTRAL HERNIA   .  DEGENERATIVE JOINT DISEASE   .  GASTRIC ULCER, HX OF   .  COLONIC POLYPS, HYPERPLASTIC, HX OF   .  History of depression   .  Dyspnea   .  Skin infection, bacterial   .  Sundowning    Past Medical History:  Past Medical History   Diagnosis  Date   .  HYPERLIPIDEMIA-MIXED  02/10/2008   .  PARKINSON'S DISEASE  02/10/2008   .  HYPERTENSION, BENIGN  02/09/2009   .  GASTROINTESTINAL HEMORRHAGE, HX OF  02/10/2008   .  ANEMIA  04/29/2010   .  FIBRILLATION, ATRIAL  04/29/2010   .  VENTRAL HERNIA  04/29/2010   .  DEGENERATIVE JOINT DISEASE  04/29/2010   .  Gastric ulcer    .  Positive PPD    .  Internal hemorrhoids    .  Hyperplastic colon polyp  2004   .  CAD (coronary artery disease)      23.0 x 24 mm Taxus DES mid LAD 2005, DCA diagonal branch 2005   .  VENOUS INSUFFICIENCY  02/10/2008     rt   .  Shortness of breath    .  Osteomyelitis    .  Calculus of kidney    .  Tuberculosis      78 yrs old   .  GERD (gastroesophageal reflux disease)      ulcers hx   .  Infection and inflammatory reaction due to device, implant, and graft    .  Neuromuscular disorder    .  Sundowning      past lumbar decompressive surgery    Past Surgical History:  Past Surgical History   Procedure  Date   .  Replacement total knee  2002     right   .  Nose surgery    .  Ankle surgery      left   .  Toe amputation     right foot   .  Cataract extraction    .  Carpal tunnel release      left, open rt   .  Tonsillectomy    .  Appendectomy    .  Coronary angioplasty with stent placement    .  Back surgery  2006     back x3   .  Deep brain stimulator placement  02     batteries changed 07   .  Subthalamic stimulator battery replacement  02/27/2011     Procedure: SUBTHALAMIC STIMULATOR BATTERY REPLACEMENT; Surgeon: Lance Heckle, MD; Location: [facility]; Service: Neurosurgery; Laterality: Left; DBS battery replacment x 2 - Left Chest and Abdomen   .  Eye muscle surgery      bilat   .  Subthalamic stimulator removal  05/22/2011  Procedure: SUBTHALAMIC STIMULATOR REMOVAL; Surgeon: Lance Harman, MD; Location: [facility]; Service: Neurosurgery; Laterality: N/A; removal of IPG   .  Subthalamic stimulator removal  07/24/2011     Procedure: SUBTHALAMIC STIMULATOR REMOVAL; Surgeon: Lance Harman, MD; Location: [facility]; Service: Neurosurgery; Laterality: N/A; Deep Brain stimulator hardware removal   .  Foot amputation through metatarsal      right    Assessment & Plan  Clinical Impression: Patient is a 78 y.o. year old male with recent admission to the hospital with Parkinson's diease with DBS who developed drainage from behind his left ear in march and has been on IV antibiotics since March 26th? Admitted on 05/20 for removal of DBS. Patient transferred to CIR on 07/28/2011 .   Pt presents with decreased activity tolerance, decreased functional mobility, decreased balance and decreased coordination limiting pt's independence with leisure/community pursuits. Leisure History/Participation Premorbid leisure interest/current participation: Games - Clinical cytogeneticist - Cross-word;Games - Cards;Sports - Baseball;Nature - Other (Comment);Community - Press photographer - Physicist, medical Other Leisure Interests: Television;Reading Leisure Participation Style: Alone;With Family/Friends Awareness of Community  Resources: Excellent Psychosocial / Spiritual Patient agreeable to Pet Therapy: Yes Does patient have pets?: No Social interaction - Mood/Behavior: Cooperative Firefighter Appropriate for Education?: Yes Recreational Therapy Orientation Orientation -Reviewed with patient: Available activity resources;Use of Dayroom Strengths/Weaknesses Patient Strengths/Abilities: Willingness to participate Patient weaknesses: Physical limitations  Plan Rec Therapy Plan Is patient appropriate for Therapeutic Recreation?: Yes Rehab Potential: Good Treatment times per week: Min 1 time per week >20 minutes Estimated Length of Stay: 2 weeks TR Treatment/Interventions: Adaptive equipment instruction;1:1 session;Balance/vestibular training;Community reintegration;Functional mobility training;Patient/family education;Recreation/leisure participation;Therapeutic activities;UE/LE Coordination activities  Recommendations for other services: None  Discharge Criteria: Patient will be discharged from TR if patient refuses treatment 3 consecutive times without medical reason.  If treatment goals not met, if there is a change in medical status, if patient makes no progress towards goals or if patient is discharged from hospital.  The above assessment, treatment plan, treatment alternatives and goals were discussed and mutually agreed upon: by patient  [name] 07/29/2011, 3:57 PM

## 2011-07-29 NOTE — Plan of Care (Addendum)
Overall Plan of Care [facility]) Patient Details Name: [name] MRN: [id number] DOB: 1929/05/18  Diagnosis:  Parkinson's disease with exacerbation deconditioning after DBS removal   Primary Diagnosis:    <principal problem not specified> Co-morbidities: dementia.  Functional Problem List  Patient demonstrates impairments in the following areas: Balance, Bladder, Bowel, Endurance, Motor, Pain, Safety and Skin Integrity  Basic ADL's: eating, grooming, bathing, dressing and toileting Advanced ADL's: simple meal preparation  Transfers:  bed mobility, bed to chair, toilet, tub/shower, car and furniture Locomotion:  ambulation, wheelchair mobility and stairs  Additional Impairments:  Leisure Awareness  Anticipated Outcomes Item Anticipated Outcome  Eating/Swallowing  Mod I  Basic self-care  supervision  Tolieting  supervision  Bowel/Bladder  Cont. Of Bowel and bladder with minimal assist  Transfers  supervision  Locomotion  Supervision w/c or RW  Communication    Cognition    Pain  <2  Safety/Judgment  Minimal assist  Other     Therapy Plan:   OT Frequency: 1-2 X/day, 60-90 minutes     Team Interventions: Item RN PT OT SLP SW TR Other  Self Care/Advanced ADL Retraining   x      Neuromuscular Re-Education  x x      Therapeutic Activities  x x      UE/LE Strength Training/ROM  x x      UE/LE Coordination Activities  x x      Visual/Perceptual Remediation/Compensation         DME/Adaptive Equipment Instruction  x x      Therapeutic Exercise  x x      Balance/Vestibular Training  x x      Patient/Family Education x x x x     Cognitive Remediation/Compensation         Functional Mobility Training  x x      Ambulation/Gait Training  x       Museum/gallery curator  x       Wheelchair Propulsion/Positioning  x       Librarian, academic    x     Speech/Language Facilitation          Bladder Management x        Bowel Management x        Disease Management/Prevention x        Pain Management x x       Medication Management         Skin Care/Wound Management x        Splinting/Orthotics         Discharge Planning  x  x     Psychosocial Support    x                        Team Discharge Planning: Destination:  Home Projected Follow-up:  PT, OT and Home Health Projected Equipment Needs:  Walker Patient/family involved in discharge planning:  Yes  MD ELOS: 08/08/11 Medical Rehab Prognosis:  Good Assessment: The patient has been admitted for CIR therapies for deficits as a result of hi Parkinson's disease and exacerbation after his stimulator infection and removal.  Goals are set at supervision with PT and OT.  The focus is on fxnl mobility, safety, AET, ADL's,  and communication. Pt is generally motivated and his wife is quite involved.

## 2011-07-29 NOTE — Evaluation (Signed)
Physical Therapy Assessment and Plan  Patient Details  Name: [name] MRN: [id number] Date of Birth: 02-27-1930  PT Diagnosis: Abnormal posture, Abnormality of gait and Muscle weakness Rehab Potential: Good ELOS: 2 weeks   Today's Date: 07/29/2011 Time: 700-755 55 minutes Problem List:  Patient Active Problem List  Diagnoses  . HYPERLIPIDEMIA-MIXED  . PARKINSON'S DISEASE  . HYPERTENSION, BENIGN  . Coronary Atherosclerosis of Native Coronary Artery  . VENOUS INSUFFICIENCY  . Shortness of breath  . GASTROINTESTINAL HEMORRHAGE, HX OF  . ANEMIA  . FIBRILLATION, ATRIAL  . UMBILICAL HERNIA  . VENTRAL HERNIA  . DEGENERATIVE JOINT DISEASE  . GASTRIC ULCER, HX OF  . COLONIC POLYPS, HYPERPLASTIC, HX OF  . History of depression  . Dyspnea  . Skin infection, bacterial  . Sundowning    Past Medical History:  Past Medical History  Diagnosis Date  . HYPERLIPIDEMIA-MIXED 02/10/2008  . PARKINSON'S DISEASE 02/10/2008  . HYPERTENSION, BENIGN 02/09/2009  . GASTROINTESTINAL HEMORRHAGE, HX OF 02/10/2008  . ANEMIA 04/29/2010  . FIBRILLATION, ATRIAL 04/29/2010  . VENTRAL HERNIA 04/29/2010  . DEGENERATIVE JOINT DISEASE 04/29/2010  . Gastric ulcer   . Positive PPD   . Internal hemorrhoids   . Hyperplastic colon polyp 2004  . CAD (coronary artery disease)     23.0 x 24 mm Taxus DES mid LAD 2005, DCA diagonal branch 2005  . VENOUS INSUFFICIENCY 02/10/2008    rt  . Shortness of breath   . Osteomyelitis   . Calculus of kidney   . Tuberculosis     78 yrs old  . GERD (gastroesophageal reflux disease)     ulcers hx  . Infection and inflammatory reaction due to device, implant, and graft   . Neuromuscular disorder   . Sundowning     past lumbar decompressive surgery   Past Surgical History:  Past Surgical History  Procedure Date  . Replacement total knee 2002    right  . Nose surgery   . Ankle surgery     left  . Toe amputation     right foot  . Cataract extraction   . Carpal  tunnel release     left, open rt  . Tonsillectomy   . Appendectomy   . Coronary angioplasty with stent placement   . Back surgery 2006     back x3  . Deep brain stimulator placement 02    batteries changed 07  . Subthalamic stimulator battery replacement 02/27/2011    Procedure: SUBTHALAMIC STIMULATOR BATTERY REPLACEMENT;  Surgeon: Dorian Heckle, MD;  Location: [facility];  Service: Neurosurgery;  Laterality: Left;  DBS battery replacment x 2 - Left Chest and Abdomen  . Eye muscle surgery     bilat  . Subthalamic stimulator removal 05/22/2011    Procedure: SUBTHALAMIC STIMULATOR REMOVAL;  Surgeon: Maeola Harman, MD;  Location: [facility];  Service: Neurosurgery;  Laterality: N/A;  removal of IPG  . Subthalamic stimulator removal 07/24/2011    Procedure: SUBTHALAMIC STIMULATOR REMOVAL;  Surgeon: Maeola Harman, MD;  Location: [facility];  Service: Neurosurgery;  Laterality: N/A;  Deep Brain stimulator hardware removal  . Foot amputation through metatarsal     right    Assessment & Plan Clinical Impression: Patient is a 78 y.o. year old male with recent admission to the hospital with Parkinson's diease with DBS who developed drainage from behind his left ear in march and has been on IV antibiotics since March 26th? Admitted on 05/20 for removal of DBS.  Patient transferred to CIR on 07/28/2011 .   Patient currently requires mod with mobility secondary to muscle weakness and muscle joint tightness, decreased cardiorespiratoy endurance and decreased coordination.  Prior to hospitalization, patient was supervision with mobility and lived with Spouse in a Apartment home.  Home access is  Level entry.  Patient will benefit from skilled PT intervention to maximize safe functional mobility, minimize fall risk and decrease caregiver burden for planned discharge home with 24 hour supervision.  Anticipate patient will benefit from follow up HH at discharge.  PT - End of Session Activity Tolerance:  Tolerates 30+ min activity with multiple rests Endurance Deficit: Yes Endurance Deficit Description: limited by orthostatic BP PT Assessment Rehab Potential: Good PT Plan PT Frequency: 1-2 X/day, 60-90 minutes Estimated Length of Stay: 2 weeks PT Treatment/Interventions: Ambulation/gait training;Balance/vestibular training;Discharge planning;DME/adaptive equipment instruction;Neuromuscular re-education;Pain management;Patient/family education;Functional mobility training;Therapeutic Activities;Stair training;UE/LE Coordination activities;Wheelchair propulsion/positioning;UE/LE Strength taining/ROM;Therapeutic Exercise PT Recommendation Follow Up Recommendations: Home health PT  PT Evaluation Precautions/Restrictions Precautions Precautions: Fall Precaution Comments: TED hose and abdominal binder when OOB Restrictions Weight Bearing Restrictions: No  Vital Signs  Pt with c/o "lightheadedness"  BP sitting 86/55.  RN made aware.  Later in session pt c/o lightheadedness again, pt laid in supine to rest, BP in supine 180/102, RN made aware. Pain Pain Assessment Pain Assessment: No/denies pain Home Living/Prior Functioning Home Living Lives With: Spouse Available Help at Discharge: Family Type of Home: Apartment Home Access: Level entry Home Layout: One level Bathroom Shower/Tub: Engineer, manufacturing systems: Handicapped height Bathroom Accessibility: Yes How Accessible: Accessible via wheelchair;Accessible via walker Home Adaptive Equipment: Walker - rolling;Wheelchair - manual Prior Function Level of Independence: Requires assistive device for independence Vocation: Retired Comments: used RW and w/c around Pharmacist, hospital Overall Cognitive Status: Appears within functional limits for tasks assessed Arousal/Alertness: Awake/alert Orientation Level: Oriented X4 Safety/Judgment: Appears intact Sensation Sensation Light Touch: Impaired by gross assessment (R foot/toe  numbness) Light Touch Impaired Details: Impaired RUE;Impaired LUE (hands/fingers numb) Coordination Gross Motor Movements are Fluid and Coordinated: No Fine Motor Movements are Fluid and Coordinated: No Coordination and Movement Description: dyskinesias Motor  Motor Motor: Abnormal postural alignment and control Motor - Skilled Clinical Observations: increased dyskinesias  Mobility Bed Mobility Supine to Sit: 4: Min assist Supine to Sit Details (indicate cue type and reason): manual facilitation for wt shifting Transfers Sit to Stand: 3: Mod assist Sit to Stand Details (indicate cue type and reason): cues for forward wt shift, hand placement, lifting assist at hips Stand to Sit: 3: Mod assist Stand Pivot Transfers: 3: Mod assist Stand Pivot Transfer Details (indicate cue type and reason): manual facilitation for wt shift, sequencing steps, safety Locomotion  Ambulation Ambulation/Gait Assistance: 4: Min assist Ambulation Distance (Feet): 50 Feet Assistive device: Rolling walker Ambulation/Gait Assistance Details: manual assist for wt shift, RW safety with obstacles, pt with shuffling gait noted on turns Stairs / Additional Locomotion Stairs: No Wheelchair Mobility Wheelchair Mobility: Yes Wheelchair Assistance: 5: Financial planner Details:  (cues to increase arm motion to propel faster) Wheelchair Propulsion: Both upper extremities Wheelchair Parts Management: Needs assistance Distance: 50  Trunk/Postural Assessment  Cervical Assessment Cervical Assessment:  (fwd head, decreased ROM) Thoracic Assessment Thoracic Assessment:  (kyphosis) Lumbar Assessment Lumbar Assessment:  (posterior pelvic tilt, decreased ROM) Postural Control Postural Control:  (posterior pelvic tilt)  Balance Balance Balance Assessed: Yes Static Sitting Balance Static Sitting - Balance Support: Feet supported Static Sitting - Level of Assistance: 5: Stand by assistance  Static  Sitting - Comment/# of Minutes: cuing for forward flexion Dynamic Sitting Balance Dynamic Sitting - Level of Assistance: 4: Min assist Static Standing Balance Static Standing - Level of Assistance: 3: Mod assist;2: Max assist (without AD for functional task) Extremity Assessment  RUE Assessment RUE Assessment: Within Functional Limits LUE Assessment LUE Assessment: Within Functional Limits RLE Assessment RLE Assessment:  (grossly 3/5 except ankle 3-/5 due to decreased ROM) LLE Assessment LLE Assessment:  (grossly 3/5 except ankle 3-/5 due to decreased ROM)  See FIM for current functional status Refer to Care Plan for Long Term Goals  Recommendations for other services: None  Discharge Criteria: Patient will be discharged from PT if patient refuses treatment 3 consecutive times without medical reason, if treatment goals not met, if there is a change in medical status, if patient makes no progress towards goals or if patient is discharged from hospital.  The above assessment, treatment plan, treatment alternatives and goals were discussed and mutually agreed upon: by patient  Treatment initiated during session: Standing balance static with focus on decreasing posterior lean, reaching forward to encourage forward wt shifts, pt required mod A for standing balance with RW.  Gait training up to 23' with RW with min A.  Pt demo's more shuffling gait when fatigued.  Pt limited by orthostatic BP, feeling of "lightheadedness", pt encouraged to drink fluids, RN made aware.  Aleecia Tapia 07/29/2011, 2:16 PM

## 2011-07-30 ENCOUNTER — Encounter (HOSPITAL_COMMUNITY): Payer: Self-pay

## 2011-07-30 LAB — CARDIAC PANEL(CRET KIN+CKTOT+MB+TROPI)
CK, MB: 5 ng/mL — ABNORMAL HIGH (ref 0.3–4.0)
Troponin I: <0.3 ng/mL (ref <0.30)

## 2011-07-30 LAB — BASIC METABOLIC PANEL
BUN: 46 mg/dL — ABNORMAL HIGH (ref 6–23)
GFR calc Af Amer: 40 mL/min — ABNORMAL LOW (ref >90)
GFR calc non Af Amer: 34 mL/min — ABNORMAL LOW (ref >90)
Potassium: 4.7 mEq/L (ref 3.5–5.1)
Sodium: 135 mEq/L (ref 135–145)

## 2011-07-30 MED ORDER — GUAIFENESIN ER 600 MG PO TB12
600.0000 mg | ORAL_TABLET | Freq: Two times a day (BID) | ORAL | Status: DC
  Administered 2011-07-30 – 2011-08-11 (×25): 600 mg via ORAL
  Filled 2011-07-30 (×27): qty 1

## 2011-07-30 NOTE — Progress Notes (Signed)
Occupational Therapy Session Note  Patient Details  Name: [name] MRN: [id number] Date of Birth: [birth year]  Today's Date: 07/30/2011 Time: 4098-1191 Time Calculation (min): 51 min  Short Term Goals: Week 1:  OT Short Term Goal 1 (Week 1): Pt will perform sit to stands with min A for transfers and toileting OT Short Term Goal 2 (Week 1): Pt will don underwear and pants with mod A OT Short Term Goal 3 (Week 1): Pt will transfer to toilet/ BSC with min A OT Short Term Goal 4 (Week 1): Pt will perform toileting with max A  Skilled Therapeutic Interventions/Progress Updates:    Worked on sit to stand and static standing balance in the therapy gym.  Began by working on trunk flexion in sitting to help increase forward weight transition for sit to stand.  Pt with limited flexion secondary to history of back surgery.  Transitioned to standing with mod instructional cueing for technique.  Demonstrates trunk and cervical flexion in sitting and standing, with limited available mobility, less than neutral.  Increased weightbearing over his heels in standing resulting in LOB posteriorly when letting go of the walker with his UEs to reach for objects.   Therapy Documentation Precautions:  Precautions Precautions: Fall Precaution Comments: TED hose and abdominal binder when OOB Restrictions Weight Bearing Restrictions: No  Vital Signs: Therapy Vitals BP: 148/91 mmHg Patient Position, if appropriate: Sitting Oxygen Therapy SpO2: 97 % O2 Device: None (Room air) Pain: Pain Assessment Pain Assessment: No/denies pain ADL: See FIM for current functional status  Therapy/Group: Individual Therapy  Arshiya Jakes 07/30/2011, 1:56 PM

## 2011-07-30 NOTE — Progress Notes (Signed)
Social Work  Social Work Assessment and Plan  Patient Details  Name: [name] MRN: [id number] Date of Birth: [birth year]  Today's Date: 07/30/2011  Problem List:  Patient Active Problem List  Diagnoses  . HYPERLIPIDEMIA-MIXED  . PARKINSON'S DISEASE  . HYPERTENSION, BENIGN  . Coronary Atherosclerosis of Native Coronary Artery  . VENOUS INSUFFICIENCY  . Shortness of breath  . GASTROINTESTINAL HEMORRHAGE, HX OF  . ANEMIA  . FIBRILLATION, ATRIAL  . UMBILICAL HERNIA  . VENTRAL HERNIA  . DEGENERATIVE JOINT DISEASE  . GASTRIC ULCER, HX OF  . COLONIC POLYPS, HYPERPLASTIC, HX OF  . History of depression  . Dyspnea  . Skin infection, bacterial  . Sundowning   Past Medical History:  Past Medical History  Diagnosis Date  . HYPERLIPIDEMIA-MIXED 02/10/2008  . PARKINSON'S DISEASE 02/10/2008  . HYPERTENSION, BENIGN 02/09/2009  . GASTROINTESTINAL HEMORRHAGE, HX OF 02/10/2008  . ANEMIA 04/29/2010  . FIBRILLATION, ATRIAL 04/29/2010  . VENTRAL HERNIA 04/29/2010  . DEGENERATIVE JOINT DISEASE 04/29/2010  . Gastric ulcer   . Positive PPD   . Internal hemorrhoids   . Hyperplastic colon polyp 2004  . CAD (coronary artery disease)     23.0 x 24 mm Taxus DES mid LAD 2005, DCA diagonal branch 2005  . VENOUS INSUFFICIENCY 02/10/2008    rt  . Shortness of breath   . Osteomyelitis   . Calculus of kidney   . Tuberculosis     78 yrs old  . GERD (gastroesophageal reflux disease)     ulcers hx  . Infection and inflammatory reaction due to device, implant, and graft   . Neuromuscular disorder   . Sundowning     past lumbar decompressive surgery   Past Surgical History:  Past Surgical History  Procedure Date  . Replacement total knee 2002    right  . Nose surgery   . Ankle surgery     left  . Toe amputation     right foot  . Cataract extraction   . Carpal tunnel release     left, open rt  . Tonsillectomy   . Appendectomy   . Coronary angioplasty with stent placement   . Back surgery  2006     back x3  . Deep brain stimulator placement 02    batteries changed 07  . Subthalamic stimulator battery replacement 02/27/2011    Procedure: SUBTHALAMIC STIMULATOR BATTERY REPLACEMENT;  Surgeon: Dorian Heckle, MD;  Location: [facility];  Service: Neurosurgery;  Laterality: Left;  DBS battery replacment x 2 - Left Chest and Abdomen  . Eye muscle surgery     bilat  . Subthalamic stimulator removal 05/22/2011    Procedure: SUBTHALAMIC STIMULATOR REMOVAL;  Surgeon: Maeola Harman, MD;  Location: [facility];  Service: Neurosurgery;  Laterality: N/A;  removal of IPG  . Subthalamic stimulator removal 07/24/2011    Procedure: SUBTHALAMIC STIMULATOR REMOVAL;  Surgeon: Maeola Harman, MD;  Location: [facility];  Service: Neurosurgery;  Laterality: N/A;  Deep Brain stimulator hardware removal  . Foot amputation through metatarsal     right   Social History:  reports that he has never smoked. He has never used smokeless tobacco. He reports that he does not drink alcohol or use illicit drugs.  Family / Support Systems Marital Status: Married How Long?: 55 years Patient Roles: Spouse;Parent Spouse/Significant Other: Epimenio Schetter") (H) [phone] or (C) [phone] Children:  local daughter, Jancarlos Thrun (C) [phone] - works f/t days; son, Richrd Sox here in [place] temporarily  from [place] Other Supports: two other adult children living out of town (daughter in [place], [place] and a son in [place]) Anticipated Caregiver: wife, Galen Daft, with intermittent assist of daughter, Andrey Campanile Ability/Limitations of Caregiver: wife can provide minimum assistance 24/7  Caregiver Availability: 24/7 Family Dynamics: Wife has remained extremely involved and supportive throughout his battle with Parkinson's and with prior CIR stays ('06 and '11).  Pt describes children as very supportive as well, but limited by distance and work schedules  Social History Preferred language: English Religion: Christian Cultural  Background: NA Education: Geographical information systems officer Read: Yes Write: Yes Employment Status: Retired Date Retired/Disabled/Unemployed: mid - 90's Fish farm manager Issues: none Guardian/Conservator: none   Abuse/Neglect Physical Abuse: Denies Verbal Abuse: Denies Sexual Abuse: Denies Exploitation of patient/patient's resources: Denies Self-Neglect: Denies  Emotional Status Pt's affect, behavior adn adjustment status: Pt with flat affect and mumbled speech - notable worsened tremors now with removal of DBS.  States he is motivated for CIR therapies, yet concerned about his overall decline since DBS removal.  Denies any significant emotional distress but will monitor as he does appear to be struggling more physically since surgery. Recent Psychosocial Issues: Decline since infection began March 2013 - had to go to SNF and now hopeful he can avoid having to return Pyschiatric History: None Substance Abuse History: None  Patient / Family Perceptions, Expectations & Goals Pt/Family understanding of illness & functional limitations: Pt and wife with good, general understanding of pt's medical issues and need to remove DBS, yet note this was a very difficult decision to make.  "It worked very well for a long time" per pt.   Premorbid pt/family roles/activities: Wife has really been a caregiver to pt for many years, however, has tried to allow him as much independence as possible.  Wife is primary caretaker of the home as well. Anticipated changes in roles/activities/participation: Little change in roles anticipated except pt will likley have increased physical assistance needs. 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) Premorbid Home Care/DME Agencies: Other (Comment) Transportation available at discharge: yes Resource referrals  recommended: Support group (specify);Other (Comment) (may need some private duty assistance)  Discharge Planning Living Arrangements: Spouse/significant other Support Systems: Spouse/significant other;Children Type of Residence: Private residence Insurance Resources: Administrator (specify) Building services engineer) Financial Resources: Restaurant manager, fast food Screen Referred: No Living Expenses: Rent Money Management: Spouse Do you have any problems obtaining your medications?: Yes (Describe) (working with med assist program already) Home Management: wife Patient/Family Preliminary Plans: Pt plans to return home with wife as primary caregiver; hope to work out some financial assistance with antibiotics; may need to combine some pvt duty care with adult day health center Social Work Anticipated Follow Up Needs: HH/OP;Support Group Expected length of stay: 10 days  Clinical Impression Pleasant gentleman, familiar to CIR from two prior stays, here after removal of DBS for Parkinson's.  Very supportive wife who is available to provide 24/7 assist, yet feels she will need some private support upon d/c.  Limited family availability.  Will monitor emotional adjustment as his Parkinson's symptoms possibly increase?  Megan Salon, Keymani Glynn 07/30/2011, 3:44 PM

## 2011-07-30 NOTE — Progress Notes (Signed)
Patient had vagal episode at 0900 when transferring to [facility] with Leann and Joni Reining, NTs. Leann stated, "eyes rolled back in head and he started to fall back." Patient transferred back to bed with no fall occurrence. V/S taken: T 97.5, P 67, R 21, manual BP 182/92 lying, O2 100% on 2L [place]. Marissa Nestle, PA notified. Sign placed above bed to always use abdominal binder and ted hose when patient is out of bed, keep HOB elevated 30 degrees or greater, and keep legs elevated. Patient is stable and able to participate in therapies at this time. Will continue to monitor. Hedy Camara

## 2011-07-30 NOTE — Progress Notes (Signed)
Physical Therapy Note  Patient Details  Name: [name] MRN: [id number] Date of Birth: 1930-01-07 Today's Date: 07/30/2011  Time: 1000-1048 48 minutes  Pt with no c/o pain.  Continues with orthostatic symptoms, lightheadedness.  BP 94/50 seated during treatment, TEDs and abdominal binder applied.  Sit to stand multiple reps with focus on fwd wt shifts, standing tolerance for BP nomalization.  Pt required max A to stand today ? Due to fatigue vs BP issues.  Gait training with RW, min A 3 x 40' with increased shuffling noted today, cues for upright posture.  Pt tolerated gait training well.  Attempted to stand at end of session, pt unable with max A.  Pt states "all my energy is gone, I just need to rest".  Pt assisted back to room, encouraged to sit upright for BP stabilization.  Pt agreeable.  Individual therapy   Karanveer Ramakrishnan 07/30/2011, 1:54 PM

## 2011-07-30 NOTE — Progress Notes (Signed)
Occupational Therapy Session Note  Patient Details  Name: [name] MRN: [id number] Date of Birth: [birth year]  Today's Date: 07/30/2011 Time: 0906-1004 Time Calculation (min): 58 min  Short Term Goals: Week 1:  OT Short Term Goal 1 (Week 1): Pt will perform sit to stands with min A for transfers and toileting OT Short Term Goal 2 (Week 1): Pt will don underwear and pants with mod A OT Short Term Goal 3 (Week 1): Pt will transfer to toilet/ BSC with min A OT Short Term Goal 4 (Week 1): Pt will perform toileting with max A  Skilled Therapeutic Interventions/Progress Updates:    Worked on bathing and dressing session.  Bathing supine with HOB up secondary to pt having a hypotensive episode earlier before session.  Pt able to wash all of his UB and his upper legs.  Therapist assisted with washing his lower legs and then donned TEDs.  Transferred to the EOB with mod assist to wash back and donn abdominal binder.  BP checked and at 96/61 O2 sats 98% on room air.  Worked on donning shirt in unsupported sitting.  Pt with tendency to lean backwards while sitting EOB and needed min assist to self correct. Unable to reach down to his feet at all for LB dressing so will need to try AE next visit.  Max assist for sit to stand with pt leaning posteriorly and demonstrating limited trunk flexion with initial sit to stand.  Performed toilet transfer to 3:1 after dressing.  BP 126/70 before transfer and 159/90 after transfer.      Therapy Documentation Precautions:  Precautions Precautions: Fall Precaution Comments: TED hose and abdominal binder when OOB Restrictions Weight Bearing Restrictions: No  Vital Signs: Therapy Vitals Temp: 97.5 F (36.4 C) Temp src: Oral Pulse Rate: 67  Resp: 21  BP: 159/90 mmHg (initial sitting 96/61 with binder and teds then 126/70) Patient Position, if appropriate: Sitting Oxygen Therapy SpO2: 97 % O2 Device: None (Room air) O2 Flow Rate (L/min): 2 L/min Pulse  Oximetry Type: Intermittent Pain: Pain Assessment Pain Assessment: No/denies pain ADL: See FIM for current functional status  Therapy/Group: Individual Therapy  [name] 07/30/2011, 11:26 AM

## 2011-07-30 NOTE — Patient Care Conference (Signed)
Inpatient RehabilitationTeam Conference Note Date: 07/29/2011   Time: 3:00 PM    Patient Name: [name]      Medical Record Number: [id number]  Date of Birth: [birth year] Sex: Male         Room/Bed: 4032/4032-01 Payor Info: Payor: MEDICARE  Plan: MEDICARE PART A AND B  Product Type: *No Product type*     Admitting Diagnosis: Parkinson's Dz removal of Brain stimulator  Admit Date/Time:  07/28/2011  4:43 PM Admission Comments: No comment available   Primary Diagnosis:  <principal problem not specified> Principal Problem: <principal problem not specified>  Patient Active Problem List  Diagnoses Date Noted  . Sundowning   . Skin infection, bacterial 06/16/2011  . Dyspnea 02/10/2011  . History of depression 10/02/2010  . ANEMIA 04/29/2010  . FIBRILLATION, ATRIAL 04/29/2010  . UMBILICAL HERNIA 04/29/2010  . VENTRAL HERNIA 04/29/2010  . DEGENERATIVE JOINT DISEASE 04/29/2010  . GASTRIC ULCER, HX OF 04/29/2010  . COLONIC POLYPS, HYPERPLASTIC, HX OF 04/29/2010  . Shortness of breath 02/11/2010  . HYPERTENSION, BENIGN 02/09/2009  . HYPERLIPIDEMIA-MIXED 02/10/2008  . PARKINSON'S DISEASE 02/10/2008  . Coronary Atherosclerosis of Native Coronary Artery 02/10/2008  . VENOUS INSUFFICIENCY 02/10/2008  . GASTROINTESTINAL HEMORRHAGE, HX OF 02/10/2008    Expected Discharge Date: Expected Discharge Date: 08/08/11  Team Members Present: Physician: Dr. Faith Rogue Case Manager Present: Melanee Spry, RN Social Worker Present: Amada Jupiter, LCSW Nurse Present: Carmie End, RN PT Present: Other (comment) Sherrine Maples) OT Present: Bretta Bang, OT;Jennifer Katrinka Blazing, OT SLP Present: Feliberto Gottron, SLP Other (Discipline and Name): Tora Duck, PPS Coordinator     Current Status/Progress Goal Weekly Team Focus  Medical   severe PD with removal of DBS  improve strength, stamina.   adjust parkinson meds to optimize coordination and function   Bowel/Bladder   Incontient of bowel and bladder   manage bowel and bladder max assist   assess depends every 2 hrs to keep dry   Swallow/Nutrition/ Hydration             ADL's   min UB and max to total LB  supervision  activity tolerance with BP issues   Mobility   mod A  supervision  transfers, balance, activity tolerance   Communication             Safety/Cognition/ Behavioral Observations            Pain   n/a  <2      Skin   Reddened peri and sacrum area  no new skin breakdown  keep area dry and apply antifungal power as needed/ EBPC      *See Interdisciplinary Assessment and Plan and progress notes for long and short-term goals  Barriers to Discharge: weakness, pre-existing disease    Possible Resolutions to Barriers:  adaptive equipment, strength training, safety and caregiver ed.    Discharge Planning/Teaching Needs:    Plan is home w/ wife & 24/7 care.     Team Discussion: Pt needs thigh high teds, abd binder & HOB raised to help avoid BP drops when he gets upright.    Revisions to Treatment Plan: none    Continued Need for Acute Rehabilitation Level of Care: The patient requires daily medical management by a physician with specialized training in physical medicine and rehabilitation for the following conditions: Daily direction of a multidisciplinary physical rehabilitation program to ensure safe treatment while eliciting the highest outcome that is of practical value to the patient.: Yes Daily medical management of  patient stability for increased activity during participation in an intensive rehabilitation regime.: Yes Daily analysis of laboratory values and/or radiology reports with any subsequent need for medication adjustment of medical intervention for : Neurological problems;Post surgical problems;Other  [name] 07/30/2011, 11:22 AM

## 2011-07-30 NOTE — Progress Notes (Signed)
Physical Therapy Note  Patient Details  Name: JESSIAH STEINHART MRN: [id number] Date of Birth: 06/05/1929 Today's Date: 07/30/2011  Time: 1500-1530 30 minutes  No c/o pain.  Pt reports "I feel much better than this morning".  W/c mobility with B UEs 50' with supervision, cues to increase arm ROM to increase speed and efficiency.  Sit to stand training multiple attempts with focus on forward wt shift, B UEs pushing up from mat.  Pt able to perform with mod A this afternoon.  Standing tolerance with min A up to 2 mins before fatigued.  Gait in controlled environment 130' with min A.  Pt with much increased gait distance today!  Individual therapy   Maevyn Riordan 07/30/2011, 3:32 PM

## 2011-07-30 NOTE — Progress Notes (Signed)
Patient ID: [name], male   DOB: 05/18/1929, 78 y.o.   MRN: [id number] Patient ID: [name], male   DOB: 1929/07/29, 78 y.o.   MRN: [id number] Subjective/Complaints: Complains of cough, raspy voice. Dyskinesias better Other ROS reviewed and negative  Objective: Vital Signs: Blood pressure 120/82, pulse 86, temperature 98.5 F (36.9 C), temperature source Oral, resp. rate 17, height 6\' 1"  (1.854 m), weight 85.2 kg (187 lb 13.3 oz), SpO2 99.00%. No results found.  Basename 07/29/11 0619  WBC 7.5  HGB 11.7*  HCT 35.9*  PLT 251    Basename 07/30/11 0245 07/29/11 1001  NA 135 135  K 4.7 4.8  CL 102 99  CO2 22 24  GLUCOSE 108* 124*  BUN 46* 42*  CREATININE 1.80* 1.86*  CALCIUM 9.0 9.4   CBG (last 3)  No results found for this basename: GLUCAP:3 in the last 72 hours  Wt Readings from Last 3 Encounters:  07/28/11 85.2 kg (187 lb 13.3 oz)  07/24/11 94.802 kg (209 lb)  07/24/11 94.802 kg (209 lb)    Physical Exam:  General appearance: alert, cooperative and no distress Head: Normocephalic, without obvious abnormality, atraumatic Eyes: conjunctivae/corneas clear. PERRL, EOM's intact. Fundi benign. Ears: normal TM's and external ear canals both ears Nose: Nares normal. Septum midline. Mucosa normal. No drainage or sinus tenderness. Throat: lips, mucosa, and tongue normal; teeth and gums normal Neck: no adenopathy, no carotid bruit, no JVD, supple, symmetrical, trachea midline and thyroid not enlarged, symmetric, no tenderness/mass/nodules Back: symmetric, no curvature. ROM normal. No CVA tenderness. Resp: clear to auscultation bilaterally Cardio: regular rate and rhythm, S1, S2 normal, no murmur, click, rub or gallop GI: soft, non-tender; bowel sounds normal; no masses,  no organomegaly Extremities: extremities normal, atraumatic, no cyanosis or edema Pulses: 2+ and symmetric Skin: Skin color, texture, turgor normal. No rashes or lesions Neurologic: dysarthric speech,  masked facies,forward posture. Intentional tremors and dyskinesias noted. Fair awareness and insight.  Incision/Wound: wounds clean and intact with staples   Assessment/Plan: 1. Functional deficits secondary to Parkinson's Disease s/p removal of deep brain stimulator due to infection which require 3+ hours per day of interdisciplinary therapy in a comprehensive inpatient rehab setting. Physiatrist is providing close team supervision and 24 hour management of active medical problems listed below. Physiatrist and rehab team continue to assess barriers to discharge/monitor patient progress toward functional and medical goals. FIM: FIM - Bathing Bathing: 2: Max-Patient completes 3-4 39f 10 parts or 25-49%  FIM - Upper Body Dressing/Undressing Upper body dressing/undressing steps patient completed: Thread/unthread left sleeve of pullover shirt/dress;Put head through opening of pull over shirt/dress;Pull shirt over trunk;Thread/unthread right sleeve of pullover shirt/dresss Upper body dressing/undressing: 4: Steadying assist FIM - Lower Body Dressing/Undressing Lower body dressing/undressing: 1: Total-Patient completed less than 25% of tasks  FIM - Hotel manager Devices: Grab bar or rail for support Toileting: 1: Total-Patient completed zero steps, helper did all 3     FIM - Games developer Transfer: 4: Supine > Sit: Min A (steadying Pt. > 75%/lift 1 leg);2: Bed > Chair or W/C: Max A (lift and lower assist)  FIM - Locomotion: Wheelchair Distance: 50 Locomotion: Wheelchair: 1: Travels less than 50 ft with supervision, cueing or coaxing FIM - Locomotion: Ambulation Ambulation/Gait Assistance: 4: Min assist Locomotion: Ambulation: 2: Travels 50 - 149 ft with minimal assistance (Pt.>75%)  Comprehension Comprehension Mode: Auditory Comprehension: 6-Follows complex conversation/direction: With extra time/assistive device  Expression Expression Mode:  Verbal Expression: 5-Expresses basic  90% of the time/requires cueing < 10% of the time.  Social Interaction Social Interaction: 5-Interacts appropriately 90% of the time - Needs monitoring or encouragement for participation or interaction.  Problem Solving Problem Solving: 5-Solves complex 90% of the time/cues < 10% of the time  Memory Memory: 5-Recognizes or recalls 90% of the time/requires cueing < 10% of the time 1. DVT Prophylaxis/Anticoagulation: Mechanical: Sequential compression devices, below knee Bilateral lower extremities  2. Pain Management: prn tylenol for now to avoid neuro/sedating SE. History of delirium past back surgery. Likely chronic dementia given dx. 3. Mood: monitor for now. Off paxil to avoid SE as on Zyvox. LCSW to follow up for evaluation.  4. Parkinson's disease: Will continue mirapex, sinemet and amantadine at home schedule- mirapex adjusted slightly down per Dr. Sandria Manly.  - Continue midodrine for orthostasis. Will schedule medications at 7 am and noon to help maintain BP with activity/therapies. May have to tolerate some elevation in his resing bp. 5. Staph infection: Zyvox D # 6/42. Afebrile. Wbc 7.5 6. Constipation: Will schedule miralax daily.  7. ?COPD/reactive airway disease: Will continue advair bid as at home. 8. Renal insufficiency- IVF- pressure supports  -labs no worse today.  -recheck tomorrow 9. Cough- mucinex bid  ?residual from intubation. Chest is clear  -parkinson component   LOS (Days) 2 A FACE TO FACE EVALUATION WAS PERFORMED  Muhamad Serano T 07/30/2011, 8:13 AM

## 2011-07-30 NOTE — Progress Notes (Signed)
INITIAL ADULT NUTRITION ASSESSMENT Date: 07/30/2011   Time: 8:27 AM  Reason for Assessment: MD Consult for Special Dietary Needs  ASSESSMENT: Male 78 y.o.  Dx: Parkinson's disease, hypotension, infected DBS  Hx:  Past Medical History  Diagnosis Date  . HYPERLIPIDEMIA-MIXED 02/10/2008  . PARKINSON'S DISEASE 02/10/2008  . HYPERTENSION, BENIGN 02/09/2009  . GASTROINTESTINAL HEMORRHAGE, HX OF 02/10/2008  . ANEMIA 04/29/2010  . FIBRILLATION, ATRIAL 04/29/2010  . VENTRAL HERNIA 04/29/2010  . DEGENERATIVE JOINT DISEASE 04/29/2010  . Gastric ulcer   . Positive PPD   . Internal hemorrhoids   . Hyperplastic colon polyp 2004  . CAD (coronary artery disease)     23.0 x 24 mm Taxus DES mid LAD 2005, DCA diagonal branch 2005  . VENOUS INSUFFICIENCY 02/10/2008    rt  . Shortness of breath   . Osteomyelitis   . Calculus of kidney   . Tuberculosis     78 yrs old  . GERD (gastroesophageal reflux disease)     ulcers hx  . Infection and inflammatory reaction due to device, implant, and graft   . Neuromuscular disorder   . Sundowning     past lumbar decompressive surgery   Past Surgical History  Procedure Date  . Replacement total knee 2002    right  . Nose surgery   . Ankle surgery     left  . Toe amputation     right foot  . Cataract extraction   . Carpal tunnel release     left, open rt  . Tonsillectomy   . Appendectomy   . Coronary angioplasty with stent placement   . Back surgery 2006     back x3  . Deep brain stimulator placement 02    batteries changed 07  . Subthalamic stimulator battery replacement 02/27/2011    Procedure: SUBTHALAMIC STIMULATOR BATTERY REPLACEMENT;  Surgeon: Dorian Heckle, MD;  Location: [facility];  Service: Neurosurgery;  Laterality: Left;  DBS battery replacment x 2 - Left Chest and Abdomen  . Eye muscle surgery     bilat  . Subthalamic stimulator removal 05/22/2011    Procedure: SUBTHALAMIC STIMULATOR REMOVAL;  Surgeon: Maeola Harman, MD;  Location:  [facility];  Service: Neurosurgery;  Laterality: N/A;  removal of IPG  . Subthalamic stimulator removal 07/24/2011    Procedure: SUBTHALAMIC STIMULATOR REMOVAL;  Surgeon: Maeola Harman, MD;  Location: [facility];  Service: Neurosurgery;  Laterality: N/A;  Deep Brain stimulator hardware removal  . Foot amputation through metatarsal     right   Related Meds:     . amantadine  100 mg Oral BID  . aspirin EC  81 mg Oral Daily  . B-complex with vitamin C  1 tablet Oral Daily  . carbidopa-levodopa  1 tablet Oral QID  . carbidopa-levodopa  2 tablet Oral Q24H  . docusate sodium  100 mg Oral BID  . Fluticasone-Salmeterol  1 puff Inhalation Q12H  . guaiFENesin  600 mg Oral BID  . linezolid  600 mg Oral Q12H  . meloxicam  15 mg Oral Daily  . midodrine  5 mg Oral BID WC  . mupirocin ointment   Nasal BID  . pantoprazole  40 mg Oral Q1200  . polyethylene glycol  17 g Oral Daily  . pramipexole  0.5 mg Oral 5 X Daily  . pyridOXINE  50 mg Oral Daily  . DISCONTD: carbidopa-levodopa  1 tablet Oral Custom  . DISCONTD: carbidopa-levodopa  1 tablet Oral TID  . DISCONTD:  carbidopa-levodopa  1 tablet Oral QID  . DISCONTD: carbidopa-levodopa  2 tablet Oral Q24H  . DISCONTD: dextromethorphan-guaiFENesin  1 tablet Oral Q12H  . DISCONTD: midodrine  5 mg Oral BID WC  . DISCONTD: pramipexole  0.5 mg Oral Custom  . DISCONTD: pramipexole  0.5 mg Oral Custom  . DISCONTD: pramipexole  1 mg Oral Q24H  . DISCONTD: pramipexole  1 mg Oral Q24H   Ht: 6\' 1"  (185.4 cm)  Wt: 187 lb 13.3 oz (85.2 kg) - question accuracy of weight as this is  Weight loss of 22 lb in 4 days  Ideal Wt: 83.6 kg % Ideal Wt: 102%  Wt Readings from Last 15 Encounters:  07/28/11 187 lb 13.3 oz (85.2 kg)  07/24/11 209 lb (94.802 kg)  07/24/11 209 lb (94.802 kg)  07/17/11 211 lb (95.709 kg)  06/16/11 209 lb (94.802 kg)  06/03/11 209 lb (94.802 kg)  05/22/11 209 lb (94.802 kg)  05/22/11 209 lb (94.802 kg)  05/21/11 209 lb (94.802 kg)   03/31/11 215 lb (97.523 kg)  02/21/11 216 lb 7.9 oz (98.2 kg)  02/13/11 217 lb (98.431 kg)  02/10/11 217 lb 1.9 oz (98.485 kg)  01/01/11 214 lb (97.07 kg)  10/02/10 215 lb (97.523 kg)  Usual Wt: 210 - 215 lb % Usual Wt: 100% (using wt from 5/16)  Body mass index is 24.78 kg/(m^2). WNL  Food/Nutrition Related Hx: regular diet PTA  Labs:  CMP     Component Value Date/Time   NA 135 07/30/2011 0245   K 4.7 07/30/2011 0245   CL 102 07/30/2011 0245   CO2 22 07/30/2011 0245   GLUCOSE 108* 07/30/2011 0245   BUN 46* 07/30/2011 0245   CREATININE 1.80* 07/30/2011 0245   CALCIUM 9.0 07/30/2011 0245   PROT 7.9 07/29/2011 0619   ALBUMIN 3.6 07/29/2011 0619   AST 21 07/29/2011 0619   ALT <5 07/29/2011 0619   ALKPHOS 101 07/29/2011 0619   BILITOT 0.3 07/29/2011 0619   GFRNONAA 34* 07/30/2011 0245   GFRAA 40* 07/30/2011 0245   CBG (last 3)  No results found for this basename: GLUCAP:3 in the last 72 hours   Intake/Output Summary (Last 24 hours) at 07/30/11 0830 Last data filed at 07/30/11 0800  Gross per 24 hour  Intake    720 ml  Output    250 ml  Net    470 ml   BM on 5/21  Diet Order: Tyramine Restricted  Supplements/Tube Feeding: none  IVF:    sodium chloride Last Rate: 100 mL/hr at 07/29/11 1416   Estimated Nutritional Needs:   Kcal: 1950 - 2150 kcal Protein:  60 - 70 grams protein Fluid:  1.9 - 2.1 L/d  RD saw pt yesterday regarding Nutrition Risk Report and dietary drug interaction of Zyvox. RD consulted today for manipulation of protein dispersion 2/2 dyskinesias and adjustment of Parkinson's medication.  Dr. Sandria Manly requesting limiting protein at breakfast and lunch with a high protein meal at dinner. Pt is currently eating 100% of meals.  RD provided information of dietary needs with patient, RN, Nutritional therapist. Left handouts for pt. Pt asked appropriate questions.  Pt continues to eat well.  NUTRITION DIAGNOSIS: Food-Medication Interaction  RELATED TO: combined  ingestion of protein with administration of levodopa that may result in undesirable efficacy  AS EVIDENCED BY: suspected interaction exacerbating dyskinesias associated with Parkinson's disease.  MONITORING/EVALUATION(Goals): Goal: Pt to consume adequate nutrition and adhere to special dietary restrictions. Monitor: PO intake, weights, labs, I/O's  EDUCATION NEEDS: -Education needs addressed  INTERVENTION: 1. RD to add modifications in health touch for protein redistribution diet. Note that timing of levodopa should be monitored to avoid conflicting responses to protein at meal times. 2. RD to add high protein snack at HS 3. RD to continue to follow nutrition care plan  Dietitian #: [phone]  DOCUMENTATION CODES Per approved criteria  -Not Applicable    Adair Laundry 07/30/2011, 8:27 AM

## 2011-07-30 NOTE — Progress Notes (Signed)
Social Work Patient ID: [name], male   DOB: 09/29/1929, 78 y.o.   MRN: [id number]     Met yesterday afternoon with wife and patient to review team conference.  Both aware and agreeable with target d/c date of 5/31 and with supervision goals overall.  Wife is concerned about providing 24/7 care but has some possible assistance programs that may alleviate some of the care burden.  I am assisting with follow up with these programs as well as a medication assistance program for his antibiotics.  Continue to follow for support and d/c planning.  [place]

## 2011-07-31 DIAGNOSIS — Y831 Surgical operation with implant of artificial internal device as the cause of abnormal reaction of the patient, or of later complication, without mention of misadventure at the time of the procedure: Secondary | ICD-10-CM

## 2011-07-31 DIAGNOSIS — T85738A Infection and inflammatory reaction due to other nervous system device, implant or graft, initial encounter: Secondary | ICD-10-CM

## 2011-07-31 DIAGNOSIS — R5381 Other malaise: Secondary | ICD-10-CM

## 2011-07-31 LAB — BASIC METABOLIC PANEL
BUN: 34 mg/dL — ABNORMAL HIGH (ref 6–23)
Chloride: 103 mEq/L (ref 96–112)
Creatinine, Ser: 1.56 mg/dL — ABNORMAL HIGH (ref 0.50–1.35)
GFR calc Af Amer: 47 mL/min — ABNORMAL LOW (ref >90)

## 2011-07-31 MED ORDER — ENSURE COMPLETE PO LIQD
237.0000 mL | ORAL | Status: DC
  Administered 2011-07-31 – 2011-08-13 (×12): 237 mL via ORAL

## 2011-07-31 NOTE — Progress Notes (Signed)
Occupational Therapy Session Note  Patient Details  Name: [name] MRN: [id number] Date of Birth: 13-Jan-1930  Today's Date: 07/31/2011 Time: 0865-7846 Time Calculation (min): 71 min  Short Term Goals: Week 1:  OT Short Term Goal 1 (Week 1): Pt will perform sit to stands with min A for transfers and toileting OT Short Term Goal 2 (Week 1): Pt will don underwear and pants with mod A OT Short Term Goal 3 (Week 1): Pt will transfer to toilet/ BSC with min A OT Short Term Goal 4 (Week 1): Pt will perform toileting with max A  Skilled Therapeutic Interventions/Progress Updates:    Worked on bathing and dressing sit to stand at the sink.  BP in sitting just with TEDs on 151/101.  Able to perform UB bathing in sitting with supervision.  LB bathing mod assist sit to stand.  Utilized reacher with mod assist to donn pants and undergarment protector over feet.  Mod assist with mod instructional cues for technique during sit to stand.  Performed toilet transfer stand pivot with mod assist as well.  Still demonstrates decreased trunk flexion in sit to stand transitions.    Therapy Documentation Precautions:  Precautions Precautions: Fall Precaution Comments: TED hose and abdominal binder when OOB Restrictions Weight Bearing Restrictions: No  Vital Signs: Therapy Vitals BP: 151/101 mmHg Patient Position, if appropriate: Sitting Oxygen Therapy SpO2: 99 % O2 Device: None (Room air) Pulse Oximetry Type: Intermittent Pain: Pain Assessment Pain Assessment: No/denies pain Pain Score:   1 Pain Type: Acute pain Pain Location: Arm Pain Orientation: Left Pain Descriptors: Sore Pain Onset: Unable to tell Pain Intervention(s): Relaxation ADL: See FIM for current functional status  Therapy/Group: Individual Therapy  Elsworth Ledin 07/31/2011, 12:08 PM

## 2011-07-31 NOTE — Progress Notes (Signed)
INFECTIOUS DISEASE PROGRESS NOTE  ID: [name] is a 78 y.o. male with  Active Problems:  PARKINSON'S DISEASE  FIBRILLATION, ATRIAL  Skin infection, bacterial  Physical deconditioning  Subjective: Without complaints  Abtx:  Anti-infectives     Start     Dose/Rate Route Frequency Ordered Stop   07/28/11 2000   linezolid (ZYVOX) tablet 600 mg        600 mg Oral Every 12 hours 07/28/11 1652            Medications:  Scheduled:   . amantadine  100 mg Oral BID  . aspirin EC  81 mg Oral Daily  . B-complex with vitamin C  1 tablet Oral Daily  . carbidopa-levodopa  1 tablet Oral QID  . carbidopa-levodopa  2 tablet Oral Q24H  . docusate sodium  100 mg Oral BID  . feeding supplement  237 mL Oral Q24H  . Fluticasone-Salmeterol  1 puff Inhalation Q12H  . guaiFENesin  600 mg Oral BID  . linezolid  600 mg Oral Q12H  . meloxicam  15 mg Oral Daily  . midodrine  5 mg Oral BID WC  . mupirocin ointment   Nasal BID  . pantoprazole  40 mg Oral Q1200  . polyethylene glycol  17 g Oral Daily  . pramipexole  0.5 mg Oral 5 X Daily  . pyridOXINE  50 mg Oral Daily    Objective: Vital signs in last 24 hours: Temp:  [98 F (36.7 C)] 98 F (36.7 C) (05/22 1700) Pulse Rate:  [77-87] 87  (05/23 0515) Resp:  [18-19] 19  (05/23 0515) BP: (151-167)/(90-101) 151/101 mmHg (05/23 1040) SpO2:  [95 %-99 %] 99 % (05/23 0941)   General appearance: alert, cooperative and no distress Head: his wounds are well healed. clean. staples in place.   Lab Results  Basename 07/31/11 0615 07/30/11 0245 07/29/11 0619  WBC -- -- 7.5  HGB -- -- 11.7*  HCT -- -- 35.9*  NA 136 135 --  K 4.4 4.7 --  CL 103 102 --  CO2 21 22 --  BUN 34* 46* --  CREATININE 1.56* 1.80* --  GLU -- -- --   Liver Panel  Basename 07/29/11 0619  PROT 7.9  ALBUMIN 3.6  AST 21  ALT <5  ALKPHOS 101  BILITOT 0.3  BILIDIR --  IBILI --   Sedimentation Rate No results found for this basename: ESRSEDRATE in the last 72  hours C-Reactive Protein No results found for this basename: CRP:2 in the last 72 hours  Microbiology: Recent Results (from the past 240 hour(s))  SURGICAL PCR SCREEN     Status: Normal   Collection Time   07/24/11  7:30 AM      Component Value Range Status Comment   MRSA, PCR NEGATIVE  NEGATIVE  Final    Staphylococcus aureus NEGATIVE  NEGATIVE  Final   WOUND CULTURE     Status: Normal   Collection Time   07/24/11 12:07 PM      Component Value Range Status Comment   Specimen Description WOUND LEFT NECK   Final    Special Requests BEHIND LEFT EAR   Final    Gram Stain     Final    Value: NO WBC SEEN     NO SQUAMOUS EPITHELIAL CELLS SEEN     NO ORGANISMS SEEN   Culture NO GROWTH 2 DAYS   Final    Report Status 07/26/2011 FINAL   Final   ANAEROBIC  CULTURE     Status: Normal   Collection Time   07/24/11 12:07 PM      Component Value Range Status Comment   Specimen Description WOUND LEFT NECK   Final    Special Requests BEHIND LEFT EAR   Final    Gram Stain     Final    Value: NO WBC SEEN     FEW SQUAMOUS EPITHELIAL CELLS PRESENT     FEW GRAM POSITIVE COCCI IN PAIRS   Culture NO ANAEROBES ISOLATED   Final    Report Status 07/29/2011 FINAL   Final   WOUND CULTURE     Status: Normal   Collection Time   07/24/11 12:18 PM      Component Value Range Status Comment   Specimen Description WOUND   Final    Special Requests     Final    Value: DEEP BRAIN STIMULATOR LEAD FROM LEFT SIDE BRAIN TO ABDOMEN   Gram Stain     Final    Value: FEW WBC PRESENT,BOTH PMN AND MONONUCLEAR     NO SQUAMOUS EPITHELIAL CELLS SEEN     RARE GRAM POSITIVE COCCI IN PAIRS   Culture     Final    Value: FEW STAPHYLOCOCCUS SPECIES (COAGULASE NEGATIVE)     Note: RIFAMPIN AND GENTAMICIN SHOULD NOT BE USED AS SINGLE DRUGS FOR TREATMENT OF STAPH INFECTIONS.   Report Status 07/28/2011 FINAL   Final    Organism ID, Bacteria STAPHYLOCOCCUS SPECIES (COAGULASE NEGATIVE)   Final   ANAEROBIC CULTURE     Status: Normal     Collection Time   07/24/11 12:18 PM      Component Value Range Status Comment   Specimen Description WOUND   Final    Special Requests     Final    Value: DEEP BRAIN STIMULATOR LEAD FROM LEFT SIDE BRAIN TO ABDOMEN IMPROPERLY SUBMITTED FOR [facility]   Gram Stain     Final    Value: NO WBC SEEN     NO SQUAMOUS EPITHELIAL CELLS SEEN     NO ORGANISMS SEEN   Culture NO ANAEROBES ISOLATED   Final    Report Status 07/29/2011 FINAL   Final     Studies/Results: No results found.   Assessment/Plan: parkinsonism with a deep brain stimulator. deep brain similar infection (removed 07-24-11)  Cx MSSE 07-28-11  Cx MRSE 05-22-11 Anbx day 8 (zyvox) Would- leave him on zyvox given his multiple drug allergies ( he states PEN as well but can't give reaction he had).  His zyvox will continue to require monitoring- weekly CBC, watch for neuropathy. (PLT normal 07-29-11) Plan for 42 days total after removal of stimulator (stop date 09-03-11) Available if needed.    Johny Sax Infectious Diseases [phone] 07/31/2011, 3:39 PM   LOS: 3 days

## 2011-07-31 NOTE — Progress Notes (Signed)
Brief Nutrition Note  RD discussed special dietary needs with wife and son at bedside. Reviewed Low Tyramine diet for use of Zyvox and protein re-distribution diet for use of current Parkinson's medications and schedule. Family asked appropriate questions and verbalized understanding of pt's needs. Expect good compliance when d/c.  RD to continue to follow and reinforce education as needed.  Adair Laundry Pager#: [phone]

## 2011-07-31 NOTE — Progress Notes (Signed)
Physical Therapy Note  Patient Details  Name: [name] MRN: [id number] Date of Birth: 1929-11-20 Today's Date: 07/31/2011  Time: 800-900 60 minutes  No c/o pain.  Pt reports "I feel better than yesterday".  Pt with no c/o lightheadedness throughout session, TEDs applied.  Gait in household environment with min A, cues for staying close to RW, pt with more festinating gait noted this morning.  Sit to stand transfers multiple attempts with focus on forward wt shift, cues at shoulders, pt able to perform with mod A from low surface, min A from raised surface.  W/c mobility up to 29' with supervision, pt fatigues easily with this activity. Seated reaching activity with focus on decreasing posterior pelvic tilt and forward wt shift.  Pt's seated posture improves with repetition of exercises.  Gait training with cognitive challenge to decreased shuffling gait and increase gait speed and efficiency.  Pt able to slightly increase gait speed with cognitive challenges, able to gait 3 x 100' before fatigued.  Pt continues to be limited by posterior lean in seated and standing, decreased activity tolerance.  Individual therapy   Ahmari Duerson 07/31/2011, 12:19 PM

## 2011-07-31 NOTE — Progress Notes (Signed)
Subjective/Complaints: Better day yesterday. Feels that voice is better and cough is less Other ROS reviewed and negative  Objective: Vital Signs: Blood pressure 167/95, pulse 87, temperature 98 F (36.7 C), temperature source Oral, resp. rate 19, height 6\' 1"  (1.854 m), weight 85.2 kg (187 lb 13.3 oz), SpO2 95.00%. No results found.  Basename 07/29/11 0619  WBC 7.5  HGB 11.7*  HCT 35.9*  PLT 251    Basename 07/30/11 0245 07/29/11 1001  NA 135 135  K 4.7 4.8  CL 102 99  CO2 22 24  GLUCOSE 108* 124*  BUN 46* 42*  CREATININE 1.80* 1.86*  CALCIUM 9.0 9.4   CBG (last 3)  No results found for this basename: GLUCAP:3 in the last 72 hours  Wt Readings from Last 3 Encounters:  07/28/11 85.2 kg (187 lb 13.3 oz)  07/24/11 94.802 kg (209 lb)  07/24/11 94.802 kg (209 lb)    Physical Exam:  General appearance: alert, cooperative and no distress Head: Normocephalic, without obvious abnormality, atraumatic Eyes: conjunctivae/corneas clear. PERRL, EOM's intact. Fundi benign. Ears: normal TM's and external ear canals both ears Nose: Nares normal. Septum midline. Mucosa normal. No drainage or sinus tenderness. Throat: lips, mucosa, and tongue normal; teeth and gums normal Neck: no adenopathy, no carotid bruit, no JVD, supple, symmetrical, trachea midline and thyroid not enlarged, symmetric, no tenderness/mass/nodules Back: symmetric, no curvature. ROM normal. No CVA tenderness. Resp: clear to auscultation bilaterally Cardio: regular rate and rhythm, S1, S2 normal, no murmur, click, rub or gallop GI: soft, non-tender; bowel sounds normal; no masses,  no organomegaly Extremities: extremities normal, atraumatic, no cyanosis or edema Pulses: 2+ and symmetric Skin: Skin color, texture, turgor normal. No rashes or lesions Neurologic: dysarthric speech, masked facies,forward posture. Intentional tremors and dyskinesias noted. Fair awareness and insight.  Incision/Wound: wounds clean and  intact with staples   Assessment/Plan: 1. Functional deficits secondary to Parkinson's Disease s/p removal of deep brain stimulator due to infection which require 3+ hours per day of interdisciplinary therapy in a comprehensive inpatient rehab setting. Physiatrist is providing close team supervision and 24 hour management of active medical problems listed below. Physiatrist and rehab team continue to assess barriers to discharge/monitor patient progress toward functional and medical goals. FIM: FIM - Bathing Bathing Steps Patient Completed: Chest;Right Arm;Left Arm;Abdomen;Front perineal area;Right upper leg;Left upper leg Bathing: 3: Mod-Patient completes 5-7 52f 10 parts or 50-74%  FIM - Upper Body Dressing/Undressing Upper body dressing/undressing steps patient completed: Thread/unthread left sleeve of pullover shirt/dress;Put head through opening of pull over shirt/dress Upper body dressing/undressing: 3: Mod-Patient completed 50-74% of tasks FIM - Lower Body Dressing/Undressing Lower body dressing/undressing: 1: Total-Patient completed less than 25% of tasks  FIM - Hotel manager Devices: Grab bar or rail for support Toileting: 1: Total-Patient completed zero steps, helper did all 3  FIM - Diplomatic Services operational officer Devices: Psychiatrist Transfers: 2-From toilet/BSC: Max A (lift and lower assist);2-To toilet/BSC: Max A (lift and lower assist)  FIM - Press photographer Assistive Devices: Bed rails;HOB elevated Bed/Chair Transfer: 4: Supine > Sit: Min A (steadying Pt. > 75%/lift 1 leg);4: Sit > Supine: Min A (steadying pt. > 75%/lift 1 leg);2: Bed > Chair or W/C: Max A (lift and lower assist);2: Chair or W/C > Bed: Max A (lift and lower assist)  FIM - Locomotion: Wheelchair Distance: 50 Locomotion: Wheelchair: 1: Travels less than 50 ft with supervision, cueing or coaxing FIM - Locomotion: Ambulation Ambulation/Gait  Assistance: 4:  Min assist Locomotion: Ambulation: 1: Travels less than 50 ft with moderate assistance (Pt: 50 - 74%)  Comprehension Comprehension Mode: Auditory Comprehension: 5-Follows basic conversation/direction: With no assist  Expression Expression Mode: Verbal Expression: 5-Expresses basic needs/ideas: With extra time/assistive device  Social Interaction Social Interaction: 6-Interacts appropriately with others with medication or extra time (anti-anxiety, antidepressant).  Problem Solving Problem Solving: 5-Solves basic 90% of the time/requires cueing < 10% of the time  Memory Memory: 5-Recognizes or recalls 90% of the time/requires cueing < 10% of the time 1. DVT Prophylaxis/Anticoagulation: Mechanical: Sequential compression devices, below knee Bilateral lower extremities  2. Pain Management: prn tylenol for now to avoid neuro/sedating SE. History of delirium past back surgery. Likely chronic dementia given dx. 3. Mood: monitor for now. Off paxil to avoid SE as on Zyvox. LCSW to follow up for evaluation.  4. Parkinson's disease: Will continue mirapex, sinemet and amantadine at home schedule- mirapex adjusted slightly down per Dr. Sandria Manly.  - Continue midodrine for orthostasis.   -will have to tolerate resting hypertension to a certain extent. 5. Staph infection: Zyvox D # 7/42. Afebrile.  6. Constipation: Will schedule miralax daily.  7. ?COPD/reactive airway disease: Will continue advair bid as at home. 8. Renal insufficiency- IVF- pressure supports  -labs pending 9. Cough- mucinex bid  ?residual from intubation. Chest is clear  -parkinson component   LOS (Days) 3 A FACE TO FACE EVALUATION WAS PERFORMED  Mariamawit Depaoli T 07/31/2011, 7:07 AM

## 2011-07-31 NOTE — Progress Notes (Signed)
Occupational Therapy Session Note  Patient Details  Name: [name] MRN: [id number] Date of Birth: 05/22/1929  Today's Date: 07/31/2011 Time: 8295-6213 Time Calculation (min): 48 min  Short Term Goals: Week 1:  OT Short Term Goal 1 (Week 1): Pt will perform sit to stands with min A for transfers and toileting OT Short Term Goal 2 (Week 1): Pt will don underwear and pants with mod A OT Short Term Goal 3 (Week 1): Pt will transfer to toilet/ BSC with min A OT Short Term Goal 4 (Week 1): Pt will perform toileting with max A  Skilled Therapeutic Interventions/Progress Updates:    Worked on sit to stand, standing balance, and RUE functional use and coordination while standing.  Pt needs mod instructional cueing to scoot forward and to position feet and push up from the wheelchair.  In standing demonstrates occasional LOB backwards.  Stands with bilateral knee and slight lumbar flexion.  Able to tolerate standing for 3-4 mins before needing rest break.    Therapy Documentation Precautions:  Precautions Precautions: Fall Precaution Comments: TED hose and abdominal binder when OOB Restrictions Weight Bearing Restrictions: No  Vital Signs: Therapy Vitals BP: 175/100 mmHg Patient Position, if appropriate: Sitting Oxygen Therapy SpO2: 97 % O2 Device: None (Room air) Pulse Oximetry Type: Intermittent Pain: Pain Assessment Pain Assessment: No/denies pain ADL: See FIM for current functional status  Therapy/Group: Individual Therapy  [name] 07/31/2011, 4:12 PM

## 2011-07-31 NOTE — Progress Notes (Signed)
Physical Therapy Note  Patient Details  Name: JANET DECESARE MRN: [id number] Date of Birth: August 10, 1929 Today's Date: 07/31/2011  Time: 1500-1530 30 minutes  No c/o pain.  Treatment session focused on functional sit to stands and standing balance with reaching out of BOS, focus on returning to midline and decreasing posterior lean.  Pt able to perform sit to stand with manual facilitation at scapula to guide forward and up motion.  Required manual facilitation at trunk to obtain and maintain standing balance fwd without posterior lean.  Min-mod A to find midline after reaching out of BOS. Pt improves with repetition.  Individual therapy   Lissete Maestas 07/31/2011, 3:38 PM

## 2011-08-01 LAB — BASIC METABOLIC PANEL
CO2: 20 mEq/L (ref 19–32)
Calcium: 9.3 mg/dL (ref 8.4–10.5)
Creatinine, Ser: 1.34 mg/dL (ref 0.50–1.35)

## 2011-08-01 NOTE — Progress Notes (Signed)
Physical Therapy Note  Patient Details  Name: [name] MRN: [id number] Date of Birth: January 22, 1930 Today's Date: 08/01/2011  Time: 1330-1400 30 minutes  No c/o pain.  Pt with noted increased dyskinesias and tremors this afternoon.  W/c mobility with supervision 59' with cues for increased arm ROM for increased efficiency.  Sit to stands with max A this afternoon.  Pt requiring increased assistance for forward wt shift and obtaining standing balance.  Pt requires max physical A and max verbal cues for stand to sit to control descent.  Pt reports "My body won't do what I'm telling it".  Gait training on carpet with focus on obstacle negotiation and increasing step/stride length. Pt requires min A for RW control around obstacles, fatigues easily with gait on carpeted surface.  Pt with significant decline in functional mobility status since eval with increased dyskinesias inhibiting progress, PA notified of pt's decline in status.   Individual therapy  Hasaan Radde 08/01/2011, 3:31 PM

## 2011-08-01 NOTE — Progress Notes (Signed)
Occupational Therapy Session Note  Patient Details  Name: [name] MRN: [id number] Date of Birth: [birth year]  Today's Date: 08/01/2011 Time: 6295-2841 Time Calculation (min): 66 min  Short Term Goals: Week 1:  OT Short Term Goal 1 (Week 1): Pt will perform sit to stands with min A for transfers and toileting OT Short Term Goal 2 (Week 1): Pt will don underwear and pants with mod A OT Short Term Goal 3 (Week 1): Pt will transfer to toilet/ BSC with min A OT Short Term Goal 4 (Week 1): Pt will perform toileting with max A  Skilled Therapeutic Interventions/Progress Updates:    Worked on bathing and dressing at the sink.  Mod assist for sit to stand with pt not flexing trunk forward enough.  Integrated reacher for LB selfcare, however pt still having increased difficulty using it to donn underpants and pants.  He reports having double vision which makes it difficulty getting his LE's into the leg holes.  Performed toilet transfer with mod facilitation during session, utilizing the RW.  Also note pt reaching for controls on the sink or for the RW even though the itmes would not be involved in the activity he was attempting to perform.    Therapy Documentation Precautions:  Precautions Precautions: Fall Precaution Comments: TED hose and abdominal binder when OOB Restrictions Weight Bearing Restrictions: No  Vital Signs: Therapy Vitals BP: 138/81 mmHg Patient Position, if appropriate: Sitting Pain: Pain Assessment Pain Assessment: 0-10 Pain Score:   2 Pain Type: Chronic pain Pain Location: Neck Pain Orientation: Left Pain Intervention(s): Repositioned ADL: See FIM for current functional status  Therapy/Group: Individual Therapy  [name] 08/01/2011, 12:52 PM

## 2011-08-01 NOTE — Progress Notes (Signed)
Occupational  Therapy Note  Patient Details  Name: ENIO HORNBACK MRN: [id number] Date of Birth: [birth year] Today's Date: 08/01/2011  Pain:  1/10 in left shoulder Time:  1500-1545  (45 min)  Pt. Placing papers from bed to table.  Went to ADL apartment and addressed sit to stand, standing balance and functional mobility.  Pt was mod assist with mod cues for correct positioning.  He was 3 out of 4 on dyspnea scale when performing this activity.  He walked forward and backward with minimal assist for balance.  Pt complained of increased pain in left shoulder.   Applied heat pack to left shoulder for 10 minutes.    Humberto Seals 08/01/2011, 3:13 PM

## 2011-08-01 NOTE — Progress Notes (Signed)
Patient ID: [name], male   DOB: 1929-12-23, 78 y.o.   MRN: [id number] Subjective/Complaints: Just waking up. No specific complaints. Other ROS reviewed and negative  Objective: Vital Signs: Blood pressure 187/112, pulse 107, temperature 97.8 F (36.6 C), temperature source Oral, resp. rate 17, height 6\' 1"  (1.854 m), weight 85.2 kg (187 lb 13.3 oz), SpO2 97.00%. No results found. No results found for this basename: WBC:2,HGB:2,HCT:2,PLT:2 in the last 72 hours  Basename 08/01/11 0730 07/31/11 0615  NA 137 136  K 4.4 4.4  CL 106 103  CO2 20 21  GLUCOSE 95 89  BUN 27* 34*  CREATININE 1.34 1.56*  CALCIUM 9.3 9.2   CBG (last 3)  No results found for this basename: GLUCAP:3 in the last 72 hours  Wt Readings from Last 3 Encounters:  07/28/11 85.2 kg (187 lb 13.3 oz)  07/24/11 94.802 kg (209 lb)  07/24/11 94.802 kg (209 lb)    Physical Exam:  General appearance: alert, cooperative and no distress Head: Normocephalic, without obvious abnormality, atraumatic Eyes: conjunctivae/corneas clear. PERRL, EOM's intact. Fundi benign. Ears: normal TM's and external ear canals both ears Nose: Nares normal. Septum midline. Mucosa normal. No drainage or sinus tenderness. Throat: lips, mucosa, and tongue normal; teeth and gums normal Neck: no adenopathy, no carotid bruit, no JVD, supple, symmetrical, trachea midline and thyroid not enlarged, symmetric, no tenderness/mass/nodules Back: symmetric, no curvature. ROM normal. No CVA tenderness. Resp: clear to auscultation bilaterally Cardio: regular rate and rhythm, S1, S2 normal, no murmur, click, rub or gallop GI: soft, non-tender; bowel sounds normal; no masses,  no organomegaly Extremities: extremities normal, atraumatic, no cyanosis or edema Pulses: 2+ and symmetric Skin: Skin color, texture, turgor normal. No rashes or lesions Neurologic: dysarthric speech, masked facies,forward posture. Intentional tremors and dyskinesias noted.   Bradykinesias and very slow processing this am.  Incision/Wound: wounds clean and intact with staples   Assessment/Plan: 1. Functional deficits secondary to Parkinson's Disease s/p removal of deep brain stimulator due to infection which require 3+ hours per day of interdisciplinary therapy in a comprehensive inpatient rehab setting. Physiatrist is providing close team supervision and 24 hour management of active medical problems listed below. Physiatrist and rehab team continue to assess barriers to discharge/monitor patient progress toward functional and medical goals. FIM: FIM - Bathing Bathing Steps Patient Completed: Chest;Right Arm;Left Arm;Abdomen;Front perineal area;Right upper leg;Left upper leg Bathing: 3: Mod-Patient completes 5-7 88f 10 parts or 50-74%  FIM - Upper Body Dressing/Undressing Upper body dressing/undressing steps patient completed: Thread/unthread right sleeve of pullover shirt/dresss;Thread/unthread left sleeve of pullover shirt/dress;Put head through opening of pull over shirt/dress Upper body dressing/undressing: 4: Min-Patient completed 75 plus % of tasks FIM - Lower Body Dressing/Undressing Lower body dressing/undressing: 1: Total-Patient completed less than 25% of tasks  FIM - Hotel manager Devices: Grab bar or rail for support Toileting: 1: Total-Patient completed zero steps, helper did all 3  FIM - Diplomatic Services operational officer Devices: Psychiatrist Transfers: 0-Activity did not occur  FIM - Banker Devices: Walker;HOB elevated Bed/Chair Transfer: 3: Chair or W/C > Bed: Mod A (lift or lower assist);3: Bed > Chair or W/C: Mod A (lift or lower assist)  FIM - Locomotion: Wheelchair Distance: 50 Locomotion: Wheelchair: 2: Travels 50 - 149 ft with supervision, cueing or coaxing FIM - Locomotion: Ambulation Ambulation/Gait Assistance: 4: Min assist Locomotion: Ambulation: 2:  Travels 50 - 149 ft with minimal assistance (Pt.>75%)  Comprehension Comprehension Mode: Auditory  Comprehension: 5-Follows basic conversation/direction: With no assist  Expression Expression Mode: Verbal Expression: 5-Expresses basic needs/ideas: With extra time/assistive device  Social Interaction Social Interaction: 6-Interacts appropriately with others with medication or extra time (anti-anxiety, antidepressant).  Problem Solving Problem Solving: 5-Solves basic 90% of the time/requires cueing < 10% of the time  Memory Memory: 5-Recognizes or recalls 90% of the time/requires cueing < 10% of the time 1. DVT Prophylaxis/Anticoagulation: Mechanical: Sequential compression devices, below knee Bilateral lower extremities  2. Pain Management: prn tylenol for now to avoid neuro/sedating SE. History of delirium past back surgery. Likely chronic dementia given dx. 3. Mood: monitor for now. Off paxil to avoid SE as on Zyvox. LCSW to follow up for evaluation.  4. Parkinson's disease: Will continue mirapex, sinemet and amantadine at home schedule- mirapex adjusted slightly down per Dr. Sandria Manly.  -slow this AM, but it may just be episodic related to his PD and the early time i saw him. Monitor today  - Continue midodrine for orthostasis.--did quite well while up yesterday  -will have to tolerate resting hypertension to a certain extent. 5. Staph infection: Zyvox D # 7/42. Afebrile.  6. Constipation: Will schedule miralax daily.  7. ?COPD/reactive airway disease: Will continue advair bid as at home. 8. Renal insufficiency- pressure supports  -labs improved--dc ivf 9. Cough- mucinex bid  ?residual from intubation. Chest is clear  -parkinson component   LOS (Days) 4 A FACE TO FACE EVALUATION WAS PERFORMED  Lillybeth Tal T 08/01/2011, 8:26 AM

## 2011-08-01 NOTE — Progress Notes (Signed)
Physical Therapy Note  Patient Details  Name: VIGGO PERKO MRN: [id number] Date of Birth: [birth year] Today's Date: 08/01/2011  Time: 800-900 60 minutes  No c/o pain.  Gait training short distances in household environment with min A for RW control on uneven surfaces and obstacle negotiation, no LOB.  Improved fluidity of gait, decreased shuffling this morning.  Pt required min A for supine to sit, mod A to scoot EOB.  Pt displays more athetoid movements and dyskinesias this morning ? Timing of parkinson's meds.  Gait in controlled environment 50' with min A to close supervision.  Sidestepping with close supervision, cues for advancing RW.  NMR to increase trunk activation and assist with sit to supine as pt continues to require mod A for this.  NMR with manual facilitation at trunk for trunk rotation, lower and upper abdominal mm activation and hip extensor strengthening.  Pt improved supine to sit to supervision after treatment, continues to require mod A for sit to supine.  Individual therapy   Reeta Kuk 08/01/2011, 9:42 AM

## 2011-08-01 NOTE — Progress Notes (Signed)
Pt had multi attempts to BR/BSC for BM; no results; back to bed, suppository given, large amount stool felt in rectum;assisted back up to [facility] after 30 mins post suppository; results pending. Carlean Purl

## 2011-08-02 ENCOUNTER — Inpatient Hospital Stay (HOSPITAL_COMMUNITY): Payer: Medicare Other

## 2011-08-02 LAB — COMPREHENSIVE METABOLIC PANEL
ALT: 5 U/L (ref 0–53)
CO2: 21 mEq/L (ref 19–32)
Calcium: 10 mg/dL (ref 8.4–10.5)
Chloride: 103 mEq/L (ref 96–112)
GFR calc Af Amer: 57 mL/min — ABNORMAL LOW (ref >90)
GFR calc non Af Amer: 49 mL/min — ABNORMAL LOW (ref >90)
Glucose, Bld: 90 mg/dL (ref 70–99)
Sodium: 136 mEq/L (ref 135–145)
Total Bilirubin: 0.6 mg/dL (ref 0.3–1.2)

## 2011-08-02 LAB — CBC
Hemoglobin: 11 g/dL — ABNORMAL LOW (ref 13.0–17.0)
MCH: 30.4 pg (ref 26.0–34.0)
MCHC: 33.1 g/dL (ref 30.0–36.0)
Platelets: 197 10*3/uL (ref 150–400)
RDW: 14.1 % (ref 11.5–15.5)

## 2011-08-02 MED ORDER — DIPHENHYDRAMINE HCL 25 MG PO CAPS: 25.0000 mg | ORAL_CAPSULE | Freq: Three times a day (TID) | ORAL | Status: DC | PRN

## 2011-08-02 MED ORDER — KCL IN DEXTROSE-NACL 20-5-0.45 MEQ/L-%-% IV SOLN
INTRAVENOUS | Status: DC
  Administered 2011-08-02: 12:00:00 via INTRAVENOUS
  Filled 2011-08-02 (×2): qty 1000

## 2011-08-02 MED ORDER — DEXTROSE-NACL 5-0.45 % IV SOLN
INTRAVENOUS | Status: DC
  Administered 2011-08-02 – 2011-08-03 (×2): via INTRAVENOUS

## 2011-08-02 NOTE — Progress Notes (Signed)
Pt had moderate tremors this am upon initial shift assessment patient found to be lethargic and was able to state his first and last name and his date of birth. Not knowing what year month, city or facility he was in. Patient had to be promted to keep eyes open and did not eat any of his breakfast even with supervision. MD notified, new orders for CT and labs were obtained.  2 hours after parkinsons medication was administered, pt tremors and rigidity lessoned with mild tremors. Continue with plan of care.

## 2011-08-02 NOTE — Plan of Care (Signed)
Problem: RH PAIN MANAGEMENT Goal: RH STG PAIN MANAGED AT OR BELOW PT'S PAIN GOAL <2  Outcome: Progressing No complaints of pain at this time

## 2011-08-02 NOTE — Plan of Care (Signed)
Problem: RH BLADDER ELIMINATION Goal: RH STG MANAGE BLADDER WITH ASSISTANCE STG Manage Bladder With minimal Assistance  Outcome: Not Progressing Incont of bladder at HS, some continence during day with timed toileting but still has incont episodes

## 2011-08-02 NOTE — Progress Notes (Signed)
Physical Therapy Session Note  Patient Details  Name: [name] MRN: [id number] Date of Birth: 12/02/1929  Today's Date: 08/02/2011  FIEP:3295-1884, 1:00(cancelled due to CT scan)   Time Calculation (min): 14 min  Skilled Therapeutic Interventions/Progress Updates:  Missed at the beginning of the first session due to NSG assessing pt. Patient had difficulty with maintaining alertness and was not sufficiently alert to take oral meds, although RN reported vitals being fine. Also abdominal binder had to be reordered due to being misplaced.  MD ordered CT scan in the afternoon and patient missed 45 min of second session. Treatment focus: LE PROM and stretching; Activities to increase awareness and participation- 1. Bed mobility- rolling and supine<>sit   total A, max cues to participate and maintain alertness. 2. Balance sitting edge of bed- Min A with cues for awareness, abdominal binder donned.  Therapy Documentation Precautions:  Precautions Precautions: Fall Precaution Comments: TED hose and abdominal binder when OOB Restrictions Weight Bearing Restrictions: No Vital Signs: Oxygen Therapy SpO2: 94 % O2 Device: None (Room air) BP: 172/100 supine Pain: Pt unable to rate pain but indicated left knee pain with stretching, alleviated with repositioning.  See FIM for current functional status  Therapy/Group: Individual Therapy  Hortencia Conradi, PTA 08/02/2011, 1:04 PM

## 2011-08-02 NOTE — Plan of Care (Signed)
Problem: RH BOWEL ELIMINATION Goal: RH STG MANAGE BOWEL W/MEDICATION W/ASSISTANCE STG Manage Bowel with Medication with minimal Assistance.  Outcome: Progressing Large cont BM on BSC today after suppository

## 2011-08-02 NOTE — Progress Notes (Addendum)
Patient ID: [name], male   DOB: 02/10/1930, 78 y.o.   MRN: [id number] Subjective/Complaints: Patient keeps his eyes closed but responds to questions. He denies any specific complaints. Other ROS patient denies complaints. Objective: Vital Signs: Blood pressure 200/102, pulse 72, temperature 97.7 F (36.5 C), temperature source Oral, resp. rate 20, height 6\' 1"  (1.854 m), weight 187 lb 13.3 oz (85.2 kg), SpO2 92.00%.   Wt Readings from Last 3 Encounters:  07/28/11 187 lb 13.3 oz (85.2 kg)  07/24/11 209 lb (94.802 kg)  07/24/11 209 lb (94.802 kg)    Physical Exam:  Elderly male in no acute distress. HEENT exam atraumatic, normocephalic, neck supple. Patient has a resting tremor. Chest is clear auscultation cardiac exam S1-S2 are regular. Abdominal exam active bowel sounds, soft. Extremities there is no edema.  Assessment/Plan: 1. Functional deficits secondary to Parkinson's Disease s/p removal of deep brain stimulator due to infection which require 3+ hours per day of interdisciplinary therapy in a comprehensive inpatient rehab setting. Physiatrist is providing close team supervision and 24 hour management of active medical problems listed below. Physiatrist and rehab team continue to assess barriers to discharge/monitor patient progress toward functional and medical goals. FIM: FIM - Bathing Bathing Steps Patient Completed: Chest;Right Arm;Left Arm;Abdomen;Front perineal area;Right upper leg;Left upper leg Bathing: 3: Mod-Patient completes 5-7 67f 10 parts or 50-74%  FIM - Upper Body Dressing/Undressing Upper body dressing/undressing steps patient completed: Thread/unthread right sleeve of pullover shirt/dresss;Put head through opening of pull over shirt/dress;Thread/unthread left sleeve of pullover shirt/dress Upper body dressing/undressing: 4: Min-Patient completed 75 plus % of tasks FIM - Lower Body Dressing/Undressing Lower body dressing/undressing steps patient completed: Pull  underwear up/down;Don/Doff right shoe;Don/Doff left shoe Lower body dressing/undressing: 2: Max-Patient completed 25-49% of tasks  FIM - Toileting Toileting steps completed by patient: Adjust clothing prior to toileting Toileting Assistive Devices: Grab bar or rail for support Toileting: 2: Max-Patient completed 1 of 3 steps  FIM - Diplomatic Services operational officer Devices: Psychiatrist Transfers: 1-Two helpers  FIM - Banker Devices: Walker;HOB elevated Bed/Chair Transfer: 3: Bed > Chair or W/C: Mod A (lift or lower assist);3: Chair or W/C > Bed: Mod A (lift or lower assist);4: Supine > Sit: Min A (steadying Pt. > 75%/lift 1 leg);3: Sit > Supine: Mod A (lifting assist/Pt. 50-74%/lift 2 legs)  FIM - Locomotion: Wheelchair Distance: 50 Locomotion: Wheelchair: 1: Total Assistance/staff pushes wheelchair (Pt<25%) FIM - Locomotion: Ambulation Ambulation/Gait Assistance: 4: Min assist Locomotion: Ambulation: 2: Travels 50 - 149 ft with minimal assistance (Pt.>75%)  Comprehension Comprehension Mode: Auditory Comprehension: 5-Understands basic 90% of the time/requires cueing < 10% of the time  Expression Expression Mode: Verbal Expression: 4-Expresses basic 75 - 89% of the time/requires cueing 10 - 24% of the time. Needs helper to occlude trach/needs to repeat words.  Social Interaction Social Interaction: 4-Interacts appropriately 75 - 89% of the time - Needs redirection for appropriate language or to initiate interaction.  Problem Solving Problem Solving: 5-Solves basic 90% of the time/requires cueing < 10% of the time  Memory Memory: 5-Recognizes or recalls 90% of the time/requires cueing < 10% of the time 1. DVT Prophylaxis/Anticoagulation: Mechanical: Sequential compression devices, below knee Bilateral lower extremities  2. Pain Management: prn tylenol for now to avoid neuro/sedating SE. History of delirium past back  surgery. Likely chronic dementia given dx. 3. Mood: monitor for now. Off paxil to avoid SE as on Zyvox. LCSW to follow up for evaluation.  4. Parkinson's disease: Will continue mirapex, sinemet and amantadine at home schedule- mirapex adjusted slightly down per Dr. Sandria Manly.  - - Continue midodrine for orthostasis.--did quite well while up yesterday  -will have to tolerate resting hypertension to a certain extent. Will need to continue monitoring high blood pressure. 5. Staph infection: Zyvox D # 7/42. Afebrile.  6. Constipation: Will schedule miralax daily.  7. ?COPD/reactive airway disease: Will continue advair bid as at home. 8. Renal insufficiency- pressure supports  -labs improved--dc ivf Basic Metabolic Panel:    Component Value Date/Time   NA 137 08/01/2011 0730   K 4.4 08/01/2011 0730   CL 106 08/01/2011 0730   CO2 20 08/01/2011 0730   BUN 27* 08/01/2011 0730   CREATININE 1.34 08/01/2011 0730   GLUCOSE 95 08/01/2011 0730   CALCIUM 9.3 08/01/2011 0730     LOS (Days) 5 A FACE TO FACE EVALUATION WAS PERFORMED  Arraya [name] Lance 08/02/2011, 8:35 AM     i was called by  the patient's nurse. Patient's nurse states that his mental status is diminished from yesterday. I'll check laboratory work and a CT head today. He also did not eat breakfast. I'll start IV fluids for the time being.

## 2011-08-02 NOTE — Progress Notes (Signed)
Occupational Therapy Session Note  Patient Details  Name: [name] MRN: [id number] Date of Birth: 1929-06-19  Today's Date: 08/02/2011 Visit 1: Time: 1100-1150 Time Calculation (min): 50 min Visit 2: 1310-1340 (30 minutes)  Short Term Goals: Week 1:  OT Short Term Goal 1 (Week 1): Pt will perform sit to stands with min A for transfers and toileting OT Short Term Goal 2 (Week 1): Pt will don underwear and pants with mod A OT Short Term Goal 3 (Week 1): Pt will transfer to toilet/ BSC with min A OT Short Term Goal 4 (Week 1): Pt will perform toileting with max A      Skilled Therapeutic Interventions/Progress Updates:  Visit 1:  Pt was scheduled for bathing/dressing this morning.  He had a dramatic change in function yesterday afternoon and is scheduled for a CT scan this am.  Pt was crying 80% of session, keeping his eyes closed, and not verbalizing. His wife and children present.  Therapy session focused on engaging patient.  Pt sat to EOB with max assist and worked on sitting balance with supervision while trying to engage pt in following simple commands.  The patient's son assisted this clinician with standing the patient 2x for 2-3 minutes each time with mod assist to attempt to simulate more of a response. Pt was not able to follow commands this morning to scoot, bring washcloth to face.  Family agreed it was best to hold off on ADL retraining until patient more alert and engaged. Visit 2: Pt much more alert this afternoon. Tolerated sitting on EOB for 20 min. For BUE AROM exercises.  Pt followed directions fairly well and was more verbal, but continued to have increased dyskinesia. Pt stood 1x for 2 minutes with mod assist x 2 (therapist and pt's son).  Pt worked on rolling in bed for brief and gown change.  Pt began crying again at end of session, but stated that he was not in pain.       Therapy Documentation Precautions:  Precautions Precautions: Fall Precaution Comments: TED  hose and abdominal binder when OOB Restrictions Weight Bearing Restrictions: No  Pain: Pt was unable to state or rate any pain. - 1st session, 2nd session no complaints of pain  See FIM for current functional status  Therapy/Group: Individual Therapy  Avie Checo 08/02/2011, 1:04 PM

## 2011-08-03 MED ORDER — GUAIFENESIN-DM 100-10 MG/5ML PO SYRP
5.0000 mL | ORAL_SOLUTION | Freq: Four times a day (QID) | ORAL | Status: DC | PRN
  Administered 2011-08-03: 5 mL via ORAL
  Filled 2011-08-03: qty 5

## 2011-08-03 NOTE — Progress Notes (Signed)
Physical Therapy Note  Patient Details  Name: [name] MRN: [id number] Date of Birth: 04/28/1929 Today's Date: 08/03/2011  1445-1525 (40 minutes) individual Pain: no acute distress noted  BP (sitting) 157/57 Oxygen sats 93% RA Precautions: Ted hose/abdominal binder when up Treatment: supine to sit max assist +1; transfer scoot total assist +2; bilateral hip flexion/extension lower trunk rotation using therapy ball to improve flexibility/functional mobility;  Stand /turn mat to wc max assist with patient attempting to take small steps. Nizhoni Parlow,JIM 08/03/2011, 3:15 PM

## 2011-08-03 NOTE — Progress Notes (Signed)
Patient ID: [name], male   DOB: 11/13/1929, 78 y.o.   MRN: [id number] Subjective/Complaints: More alert this a.m. More responxive He denies any specific complaints.   Other ROS patient denies complaints. Objective: Vital Signs: Blood pressure 83/66, pulse 70, temperature 97.6 F (36.4 C), temperature source Oral, resp. rate 20, height 6\' 1"  (1.854 m), weight 187 lb 13.3 oz (85.2 kg), SpO2 99.00%.   Wt Readings from Last 3 Encounters:  07/28/11 187 lb 13.3 oz (85.2 kg)  07/24/11 209 lb (94.802 kg)  07/24/11 209 lb (94.802 kg)    Physical Exam:  Elderly male in no acute distress. HEENT exam atraumatic, normocephalic, neck supple. Patient has a resting tremor. Chest is clear auscultation cardiac exam S1-S2 are regular. Abdominal exam active bowel sounds, soft. Extremities there is no edema.  Assessment/Plan: 1. Functional deficits secondary to Parkinson's Disease s/p removal of deep brain stimulator due to infection which require 3+ hours per day of interdisciplinary therapy in a comprehensive inpatient rehab setting. Physiatrist is providing close team supervision and 24 hour management of active medical problems listed below. Physiatrist and rehab team continue to assess barriers to discharge/monitor patient progress toward functional and medical goals. FIM: FIM - Bathing Bathing Steps Patient Completed: Chest;Right Arm;Left Arm;Abdomen;Front perineal area;Right upper leg;Left upper leg Bathing: 3: Mod-Patient completes 5-7 33f 10 parts or 50-74%  FIM - Upper Body Dressing/Undressing Upper body dressing/undressing steps patient completed: Thread/unthread right sleeve of pullover shirt/dresss;Put head through opening of pull over shirt/dress;Thread/unthread left sleeve of pullover shirt/dress Upper body dressing/undressing: 4: Min-Patient completed 75 plus % of tasks FIM - Lower Body Dressing/Undressing Lower body dressing/undressing steps patient completed: Pull underwear  up/down;Don/Doff right shoe;Don/Doff left shoe Lower body dressing/undressing: 2: Max-Patient completed 25-49% of tasks  FIM - Toileting Toileting steps completed by patient: Adjust clothing prior to toileting Toileting Assistive Devices: Grab bar or rail for support Toileting: 2: Max-Patient completed 1 of 3 steps  FIM - Diplomatic Services operational officer Devices: Psychiatrist Transfers: 1-Two helpers  FIM - Banker Devices: Bed rails Bed/Chair Transfer: 1: Supine > Sit: Total A (helper does all/Pt. < 25%);1: Sit > Supine: Total A (helper does all/Pt. < 25%)  FIM - Locomotion: Wheelchair Distance: 50 Locomotion: Wheelchair: 0: Activity did not occur FIM - Locomotion: Ambulation Locomotion: Ambulation Assistive Devices:  (See PT session note) Ambulation/Gait Assistance: Not tested (comment) Locomotion: Ambulation: 0: Activity did not occur  Comprehension Comprehension Mode: Auditory Comprehension: 5-Understands complex 90% of the time/Cues < 10% of the time  Expression Expression Mode: Verbal Expression: 4-Expresses basic 75 - 89% of the time/requires cueing 10 - 24% of the time. Needs helper to occlude trach/needs to repeat words.  Social Interaction Social Interaction: 4-Interacts appropriately 75 - 89% of the time - Needs redirection for appropriate language or to initiate interaction.  Problem Solving Problem Solving: 5-Solves complex 90% of the time/cues < 10% of the time  Memory Memory: 5-Recognizes or recalls 90% of the time/requires cueing < 10% of the time 1. DVT Prophylaxis/Anticoagulation: Mechanical: Sequential compression devices, below knee Bilateral lower extremities  2. Pain Management: prn tylenol for now to avoid neuro/sedating SE. History of delirium past back surgery. Likely chronic dementia given dx. 3. Mood: monitor for now. Off paxil to avoid SE as on Zyvox. LCSW to follow up for evaluation.    4. Parkinson's disease: Will continue mirapex, sinemet and amantadine at home schedule- mirapex adjusted slightly down per Dr. Sandria Manly.  - -  Continue midodrine for orthostasis.--did quite well while up yesterday  -will have to tolerate resting hypertension to a certain extent. Will need to continue monitoring high blood pressure. Note variable bp 5. Staph infection: Zyvox D # 7/42. Afebrile.  6. Constipation: Will schedule miralax daily.  7. ?COPD/reactive airway disease: Will continue advair bid as at home. 8. Renal insufficiency- pressure supports  -labs improved-- was started on ivf yesterday for poor po intake--- will d/c as i anticipated he will do better today Basic Metabolic Panel:    Component Value Date/Time   NA 136 08/02/2011 0944   K 5.3* 08/02/2011 0944   CL 103 08/02/2011 0944   CO2 21 08/02/2011 0944   BUN 25* 08/02/2011 0944   CREATININE 1.33 08/02/2011 0944   GLUCOSE 90 08/02/2011 0944   CALCIUM 10.0 08/02/2011 0944     LOS (Days) 6 A FACE TO FACE EVALUATION WAS PERFORMED  Biagio Snelson HENRY 08/03/2011, 9:43 AM     i was called by  the patient's nurse. Patient's nurse states that his mental status is diminished from yesterday. I'll check laboratory work and a CT head today. He also did not eat breakfast. I'll start IV fluids for the time being.

## 2011-08-03 NOTE — Progress Notes (Signed)
Dr. Cato Mulligan notified of patient has cough at this time, receiving Mucinex 600 mg q 12 hrs without a prn order.  Order received:  Robitussin DM one teaspoon q 6 hrs prn cough.

## 2011-08-04 LAB — URINE MICROSCOPIC-ADD ON

## 2011-08-04 LAB — BASIC METABOLIC PANEL
CO2: 22 mEq/L (ref 19–32)
Chloride: 105 mEq/L (ref 96–112)
GFR calc Af Amer: 43 mL/min — ABNORMAL LOW (ref >90)
Potassium: 4.4 mEq/L (ref 3.5–5.1)
Sodium: 138 mEq/L (ref 135–145)

## 2011-08-04 LAB — CBC
MCV: 91.2 fL (ref 78.0–100.0)
Platelets: 166 10*3/uL (ref 150–400)
RBC: 3.63 MIL/uL — ABNORMAL LOW (ref 4.22–5.81)
RDW: 14.4 % (ref 11.5–15.5)
WBC: 6.5 10*3/uL (ref 4.0–10.5)

## 2011-08-04 LAB — URINALYSIS, ROUTINE W REFLEX MICROSCOPIC
Bilirubin Urine: NEGATIVE
Glucose, UA: NEGATIVE mg/dL
Hgb urine dipstick: NEGATIVE
Protein, ur: NEGATIVE mg/dL
Urobilinogen, UA: 0.2 mg/dL (ref 0.0–1.0)

## 2011-08-04 NOTE — Progress Notes (Signed)
Occupational Therapy Session Note  Patient Details  Name: [name] MRN: [id number] Date of Birth: 1929-06-13  Today's Date: 08/04/2011 Time: 0900-0959 Time Calculation (min): 59 min  Short Term Goals: Week 1:  OT Short Term Goal 1 (Week 1): Pt will perform sit to stands with min A for transfers and toileting OT Short Term Goal 2 (Week 1): Pt will don underwear and pants with mod A OT Short Term Goal 3 (Week 1): Pt will transfer to toilet/ BSC with min A OT Short Term Goal 4 (Week 1): Pt will perform toileting with max A  Skilled Therapeutic Interventions/Progress Updates:    Attempted to work on bathing and dressing at the sink.  Pt with very severe dyskinesia this am and difficulty following 1 step commands.  Perseverated on washing hs hands and was unable to progress to washing any other parts of his body.  Able to perform sit to stand with max assist for pulling pants over hips and washing peri area.  Pt also with very soft dysarthric speech and did not respond immediately when questions were asked.  Total assist for all dressing.  Nursing made aware of pt's current level of assist and how it is much different than when this therapist saw him on Friday.  Therapy Documentation Precautions:  Precautions Precautions: Fall Precaution Comments: TED hose and abdominal binder when OOB Restrictions Weight Bearing Restrictions: No  Vital Signs: Therapy Vitals BP: 134/76 mmHg Patient Position, if appropriate: Lying (HOB 35 degrees, no binder or TEDS.) Pain: Pain Assessment Pain Assessment: Faces Faces Pain Scale: No hurt ADL: See FIM for current functional status  Therapy/Group: Individual Therapy  Tattiana Fakhouri 08/04/2011, 10:27 AM

## 2011-08-04 NOTE — Progress Notes (Signed)
Physical Therapy Note  Patient Details  Name: [name] MRN: [id number] Date of Birth: [birth year] Today's Date: 08/04/2011  Time: 1300-1342 42 minutes  No c/o pain.  Pt continues with increased dyskinesias limiting mobility throughout session.  Sit to stand training from raised surface without AD with cues at hips and trunk to increase fwd wt shift and promote full trunk extension and glut activation to fully stand.  Pt able with max A to obtain static standing balance and hold with min A with facilitation for forward wt shift to decrease retropulsion.   W/c mobility with min A for steering with B UEs which decreases athetoid mvmts of both UEs and LEs.  Pt with noted decreased strength at end of session requiring +2 assist to stand from w/c.  Individual therapy  Rakeya Glab 08/04/2011, 2:40 PM

## 2011-08-04 NOTE — Progress Notes (Signed)
1800; Pt with increasing gross tremors; arms flaying, constant motion at times; unsafe to leave up in chair at this time; requires feeding; constant repositioning in bed r/t movement. Pam Love PAC aware; brief, inconsistent periods of decreased tremors toward eve's.  Has been incontinent urine x 2, continent x 1, U/A sent per orders. Tearful at times. Carlean Purl

## 2011-08-04 NOTE — Progress Notes (Signed)
Physical Therapy Note  Patient Details  Name: [name] MRN: [id number] Date of Birth: 1930/02/26 Today's Date: 08/04/2011  Time: 800-856 56 minutes  No signs/symptoms of pain.  Pt with few verbal responses during session, answering "yes" or "no" questions 20% of time.  Pt with increased dyskinesias, increased athetoid movements.  Bed mobility and transfers with max A, multiple repetitions with focus on forward wt shifts, proper hand placement for sit to stand.  Pt required max - total A for stand to sit multiple attempts with manual facilitation for knee and trunk flexion, pt with increased perseverative movements and increased episodes of "freezing" requiring visual, verbal and manual facilitation to continue tasks.  Bathroom mobility, gait and transfers with increased assistance needed for RW control and sequencing of movements.  Gait training with RW 3 x 50' with min A for RW control, steadying assist for balance.  Pt required cues to decrease shuffling pattern and increased cuing for sequencing of gait pattern with obstacle negotiation and turns.  Seated balance EOB x 10 minutes with focus on improved upright posture to facilitate eating. Pt required assistance for self feeding, limited by dyskinesias and fatigue.  Pt with overall decline in status since eval, case Production designer, theatre/television/film, RN and PA all aware.  Individual therapy   Dewarren Ledbetter 08/04/2011, 10:03 AM

## 2011-08-04 NOTE — Progress Notes (Signed)
Occupational Therapy Session Note  Patient Details  Name: [name] MRN: [id number] Date of Birth: 19-Nov-1929  Today's Date: 08/04/2011 Time: 4098-1191 Time Calculation (min): 30 min  Short Term Goals: Week 1:  OT Short Term Goal 1 (Week 1): Pt will perform sit to stands with min A for transfers and toileting OT Short Term Goal 2 (Week 1): Pt will don underwear and pants with mod A OT Short Term Goal 3 (Week 1): Pt will transfer to toilet/ BSC with min A OT Short Term Goal 4 (Week 1): Pt will perform toileting with max A  Skilled Therapeutic Interventions/Progress Updates:    Practiced tub shower transfers using RW and tub bench.  Pt currently needs max assist for sit to stand and mod assist for static standing balance once up on his feet.  Able to pivot with RW to tub bench with small shuffling steps and mod assist.  Once to the tub bench pt had difficulty initiating stand to sit and needed mod assistance to sit down.  Min assist for swinging LE's over the edge of the tub.  To transfer back out pt needed mod assistance to bring LE's out of the tub and turn his hips around to square up with the edge of the seat.  Max assistance for sit to stand from the tub bench.  Transferred back to the wheelchair with mod assistance.  Note pt during session having difficulty processing and executing 50% of verbal instructions.  Noted mostly when instructing pt to position his arms for sit to stand or to reach back and sit.  Pt delayed with initiation and requires physical assist at times to reach for surface or let go of the walker.  Therapy Documentation Precautions:  Precautions Precautions: Fall Precaution Comments: TED hose and abdominal binder when OOB Restrictions Weight Bearing Restrictions: No  Pain Assessment Pain Assessment: No/denies pain ADL: See FIM for current functional status  Therapy/Group: Individual Therapy  Amelianna Meller 08/04/2011, 3:48 PM

## 2011-08-04 NOTE — Progress Notes (Signed)
Patient ID: [name], male   DOB: [birth year], 78 y.o.   MRN: [id number] Patient ID: [name], male   DOB: 1929/09/09, 78 y.o.   MRN: [id number] Subjective/Complaints: Just waking up. No specific complaints. Other ROS reviewed and negative  Objective: Vital Signs: Blood pressure 89/58, pulse 95, temperature 98.2 F (36.8 C), temperature source Oral, resp. rate 21, height 6\' 1"  (1.854 m), weight 85.2 kg (187 lb 13.3 oz), SpO2 98.00%. Ct Head Wo Contrast  08/02/2011  *RADIOLOGY REPORT*  Clinical Data: Mental status changes after removal of deep brain stimulator.  CT HEAD WITHOUT CONTRAST  Technique:  Contiguous axial images were obtained from the base of the skull through the vertex without contrast.  Comparison: 10/09/2009  Findings: Bilateral deep brain stimulators have been removed. Staples are in place within the scalp overlying their previous entry sites.  No apparent fluid collection in that location.  There is no extra-axial fluid collection intracranially.  There is a small amount of hyperdense blood products along the previous stimulator courses, as would be expected.  There is no evidence of unexpected hemorrhage.  No mass effect.  No edema.  The brain otherwise show as atrophy and chronic small vessel disease of the deep white matter.  Ventricles are chronically prominent.  No evidence of change in size.  There is atherosclerotic calcification of the major vessels at the base of the brain.  There is mild inflammation affecting the paranasal sinuses.  IMPRESSION: Bilateral deep brain stimulator removal.  Tiny amount of blood along the previous stimulator courses as expected.  No unexpected bleeding or any mass effect or unexpected edema.  No extra-axial collection.  Original Report Authenticated By: Thomasenia Sales, M.D.    Basename 08/04/11 0625 08/02/11 0944  WBC 6.5 6.7  HGB 10.9* 11.0*  HCT 33.1* 33.2*  PLT 166 197    Basename 08/02/11 0944 08/01/11 0730  NA 136 137  K 5.3* 4.4   CL 103 106  CO2 21 20  GLUCOSE 90 95  BUN 25* 27*  CREATININE 1.33 1.34  CALCIUM 10.0 9.3   CBG (last 3)  No results found for this basename: GLUCAP:3 in the last 72 hours  Wt Readings from Last 3 Encounters:  07/28/11 85.2 kg (187 lb 13.3 oz)  07/24/11 94.802 kg (209 lb)  07/24/11 94.802 kg (209 lb)    Physical Exam:  General appearance: alert, cooperative and no distress Head: Normocephalic, without obvious abnormality, atraumatic Eyes: conjunctivae/corneas clear. PERRL, EOM's intact. Fundi benign. Ears: normal TM's and external ear canals both ears Nose: Nares normal. Septum midline. Mucosa normal. No drainage or sinus tenderness. Throat: lips, mucosa, and tongue normal; teeth and gums normal Neck: no adenopathy, no carotid bruit, no JVD, supple, symmetrical, trachea midline and thyroid not enlarged, symmetric, no tenderness/mass/nodules Back: symmetric, no curvature. ROM normal. No CVA tenderness. Resp: clear to auscultation bilaterally Cardio: regular rate and rhythm, S1, S2 normal, no murmur, click, rub or gallop GI: soft, non-tender; bowel sounds normal; no masses,  no organomegaly Extremities: extremities normal, atraumatic, no cyanosis or edema Pulses: 2+ and symmetric Skin: Skin color, texture, turgor normal. No rashes or lesions Neurologic: dysarthric speech, masked facies,forward posture. Intentional tremors and dyskinesias noted.  Bradykinesias and very slow processing this am.  Incision/Wound: wounds clean and intact with staples   Assessment/Plan: 1. Functional deficits secondary to Parkinson's Disease s/p removal of deep brain stimulator due to infection which require 3+ hours per day of interdisciplinary therapy in a comprehensive inpatient  rehab setting. Physiatrist is providing close team supervision and 24 hour management of active medical problems listed below. Physiatrist and rehab team continue to assess barriers to discharge/monitor patient progress  toward functional and medical goals. FIM: FIM - Bathing Bathing Steps Patient Completed: Chest;Right Arm;Left Arm;Abdomen;Front perineal area;Right upper leg;Left upper leg Bathing: 3: Mod-Patient completes 5-7 70f 10 parts or 50-74%  FIM - Upper Body Dressing/Undressing Upper body dressing/undressing steps patient completed: Thread/unthread right sleeve of pullover shirt/dresss;Put head through opening of pull over shirt/dress;Thread/unthread left sleeve of pullover shirt/dress Upper body dressing/undressing: 4: Min-Patient completed 75 plus % of tasks FIM - Lower Body Dressing/Undressing Lower body dressing/undressing steps patient completed: Pull underwear up/down;Don/Doff right shoe;Don/Doff left shoe Lower body dressing/undressing: 2: Max-Patient completed 25-49% of tasks  FIM - Toileting Toileting steps completed by patient: Adjust clothing prior to toileting Toileting Assistive Devices: Grab bar or rail for support Toileting: 2: Max-Patient completed 1 of 3 steps  FIM - Diplomatic Services operational officer Devices: Psychiatrist Transfers: 1-Two helpers  FIM - Banker Devices: Bed rails Bed/Chair Transfer: 1: Supine > Sit: Total A (helper does all/Pt. < 25%);1: Sit > Supine: Total A (helper does all/Pt. < 25%)  FIM - Locomotion: Wheelchair Distance: 50 Locomotion: Wheelchair: 0: Activity did not occur FIM - Locomotion: Ambulation Locomotion: Ambulation Assistive Devices:  (See PT session note) Ambulation/Gait Assistance: Not tested (comment) Locomotion: Ambulation: 0: Activity did not occur  Comprehension Comprehension Mode: Auditory Comprehension: 5-Understands complex 90% of the time/Cues < 10% of the time  Expression Expression Mode: Verbal Expression: 4-Expresses basic 75 - 89% of the time/requires cueing 10 - 24% of the time. Needs helper to occlude trach/needs to repeat words.  Social Interaction Social  Interaction: 4-Interacts appropriately 75 - 89% of the time - Needs redirection for appropriate language or to initiate interaction.  Problem Solving Problem Solving: 5-Solves complex 90% of the time/cues < 10% of the time  Memory Memory: 5-Recognizes or recalls 90% of the time/requires cueing < 10% of the time 1. DVT Prophylaxis/Anticoagulation: Mechanical: Sequential compression devices, below knee Bilateral lower extremities  2. Pain Management: prn tylenol for now to avoid neuro/sedating SE. History of delirium post back surgery. Likely chronic dementia given dx. 3. Mood: monitor for now. Off paxil to avoid SE as on Zyvox. LCSW to follow up for evaluation.  4. Parkinson's disease: Will continue mirapex, sinemet and amantadine at home schedule- mirapex adjusted slightly down per Dr. Sandria Manly.  -slow on Friday and Saturday but better yesterday.  Seems slow again to me this AM--follow today  -check UA today. Labs essentially unremarkable except for sl elevated K+. Recheck tomorrow  -Head CT negative from Saturday.   - Continue midodrine for orthostasis.--did quite well while up yesterday  -will have to tolerate resting hypertension to a certain extent. 5. Staph infection: Zyvox for 6 weeks  6. Constipation: Will schedule miralax daily.  7. ?COPD/reactive airway disease: Will continue advair bid as at home. 8. Renal insufficiency- pressure supports  -started on ivf over the weekend which was again stopped-ate very well yesterday. Recheck blood work tomorrow 9. Cough- mucinex bid  ?residual from intubation. Chest is clear  -parkinson component   LOS (Days) 7 A FACE TO FACE EVALUATION WAS PERFORMED  Layton Naves T 08/04/2011, 7:16 AM

## 2011-08-05 DIAGNOSIS — R5381 Other malaise: Secondary | ICD-10-CM

## 2011-08-05 DIAGNOSIS — Z5189 Encounter for other specified aftercare: Secondary | ICD-10-CM

## 2011-08-05 DIAGNOSIS — R269 Unspecified abnormalities of gait and mobility: Secondary | ICD-10-CM

## 2011-08-05 DIAGNOSIS — A4189 Other specified sepsis: Secondary | ICD-10-CM

## 2011-08-05 DIAGNOSIS — G20 Parkinson's disease: Secondary | ICD-10-CM

## 2011-08-05 LAB — BASIC METABOLIC PANEL
BUN: 40 mg/dL — ABNORMAL HIGH (ref 6–23)
CO2: 23 mEq/L (ref 19–32)
Chloride: 104 mEq/L (ref 96–112)
Creatinine, Ser: 1.85 mg/dL — ABNORMAL HIGH (ref 0.50–1.35)
GFR calc Af Amer: 38 mL/min — ABNORMAL LOW (ref >90)
Potassium: 4.7 mEq/L (ref 3.5–5.1)

## 2011-08-05 LAB — URINE CULTURE: Colony Count: 75000

## 2011-08-05 MED ORDER — SENNOSIDES-DOCUSATE SODIUM 8.6-50 MG PO TABS
2.0000 | ORAL_TABLET | Freq: Every day | ORAL | Status: DC
  Administered 2011-08-05 – 2011-08-13 (×9): 2 via ORAL
  Filled 2011-08-05 (×9): qty 2

## 2011-08-05 MED ORDER — SODIUM CHLORIDE 0.45 % IV SOLN
INTRAVENOUS | Status: DC
  Administered 2011-08-08: 11:00:00 via INTRAVENOUS
  Administered 2011-08-09: 75 mL/h via INTRAVENOUS
  Administered 2011-08-10: 22:00:00 via INTRAVENOUS

## 2011-08-05 NOTE — Evaluation (Addendum)
Clinical/Bedside Swallow Evaluation Patient Details  Name: [name] MRN: [id number] DOB: 03-Apr-1929  Today's Date: 08/05/2011 Time: 6962-9528 Time Calculation (min): 26 min  Past Medical History:  Past Medical History  Diagnosis Date  . HYPERLIPIDEMIA-MIXED 02/10/2008  . PARKINSON'S DISEASE 02/10/2008  . HYPERTENSION, BENIGN 02/09/2009  . GASTROINTESTINAL HEMORRHAGE, HX OF 02/10/2008  . ANEMIA 04/29/2010  . FIBRILLATION, ATRIAL 04/29/2010  . VENTRAL HERNIA 04/29/2010  . DEGENERATIVE JOINT DISEASE 04/29/2010  . Gastric ulcer   . Positive PPD   . Internal hemorrhoids   . Hyperplastic colon polyp 2004  . CAD (coronary artery disease)     23.0 x 24 mm Taxus DES mid LAD 2005, DCA diagonal branch 2005  . VENOUS INSUFFICIENCY 02/10/2008    rt  . Shortness of breath   . Osteomyelitis   . Calculus of kidney   . Tuberculosis     78 yrs old  . GERD (gastroesophageal reflux disease)     ulcers hx  . Infection and inflammatory reaction due to device, implant, and graft   . Neuromuscular disorder   . Sundowning     past lumbar decompressive surgery   Past Surgical History:  Past Surgical History  Procedure Date  . Replacement total knee 2002    right  . Nose surgery   . Ankle surgery     left  . Toe amputation     right foot  . Cataract extraction   . Carpal tunnel release     left, open rt  . Tonsillectomy   . Appendectomy   . Coronary angioplasty with stent placement   . Back surgery 2006     back x3  . Deep brain stimulator placement 02    batteries changed 07  . Subthalamic stimulator battery replacement 02/27/2011    Procedure: SUBTHALAMIC STIMULATOR BATTERY REPLACEMENT;  Surgeon: Dorian Heckle, MD;  Location: [facility];  Service: Neurosurgery;  Laterality: Left;  DBS battery replacment x 2 - Left Chest and Abdomen  . Eye muscle surgery     bilat  . Subthalamic stimulator removal 05/22/2011    Procedure: SUBTHALAMIC STIMULATOR REMOVAL;  Surgeon: Maeola Harman, MD;   Location: [facility];  Service: Neurosurgery;  Laterality: N/A;  removal of IPG  . Subthalamic stimulator removal 07/24/2011    Procedure: SUBTHALAMIC STIMULATOR REMOVAL;  Surgeon: Maeola Harman, MD;  Location: [facility];  Service: Neurosurgery;  Laterality: N/A;  Deep Brain stimulator hardware removal  . Foot amputation through metatarsal     right   HPI:  Mr. [name] is a 78 year old male known to CIR from prior stay for lumbar decompression (10/2009 d/c to SNF), Parkinson's diease with DBS who developed drainage from behind his left ear in march and has been on IV antibiotics since March 26th? Admitted on 05/20 for removal of DBS by Dr. Venetia Maxon. Post op on zyvox for 6 weeks per Dr. Zenaida Niece Dam's input. Noted to have increase in dyskinesias past DBS removal. Patient transferred to [facility] 07/28/11 and has been participating in therapies; however, has had a decline in function and developed difficulty swallowing per RN report. As a result, SLP recieved orders for bedside swallow evaluation.     Assessment/Recommendations/Treatment Plan    SLP Assessment Clinical Impression Statement: Patient presents with mildly delayed oral manipulation and transit of regular textures and mild delay in swallow initiation ofr hin liquids; however, given presentation duirng evaluation it is recommended that this patient consume a Dys.3 texture and thin liquid  diet with full supervision for safety and assist to self feed.   Risk for Aspiration: Mild Other Related Risk Factors: Decreased management of secretions  Swallow Evaluation Recommendations Diet Recommendations: Dysphagia 3 (Mechanical Soft);Thin liquid Liquid Administration via: Straw;Cup Medication Administration: Whole meds with puree Supervision: Patient able to self feed;Full supervision/cueing for compensatory strategies Compensations: Slow rate;Small sips/bites;Check for pocketing Postural Changes and/or Swallow Maneuvers: Out of bed for meals;Upright 30-60  min after meal Oral Care Recommendations: Oral care BID Follow up Recommendations: None  Treatment Plan Treatment Plan Recommendations: Therapy as outlined in treatment plan below Treatment Duration:  (1-2 follow up sessions to complete education with wife) Interventions: Aspiration precaution training;Compensatory techniques;Patient/family education;Trials of upgraded texture/liquids;Diet toleration management by SLP  Prognosis Prognosis for Safe Diet Advancement: Guarded Barriers to Reach Goals: Cognitive deficits  Individuals Consulted Consulted and Agree with Results and Recommendations: Patient unable/family or caregiver not available  Swallowing Goals  SLP Swallowing Goals Patient will consume recommended diet without observed clinical signs of aspiration with: Moderate assistance Patient will utilize recommended strategies during swallow to increase swallowing safety with: Moderate assistance Goal #3: Patient's care taker with verbalize/return demonstration of techniques to assist patient with modified independence  Swallow Study Prior Functional Status   Regular textures and thin liquids  General  HPI: Mr. [name] is a 78 year old male known to CIR from prior stay for lumbar decompression (10/2009 d/c to SNF), Parkinson's diease with DBS who developed drainage from behind his left ear in march and has been on IV antibiotics since March 26th? Admitted on 05/20 for removal of DBS by Dr. Venetia Maxon. Post op on zyvox for 6 weeks per Dr. Zenaida Niece Dam's input. Noted to have increase in dyskinesias past DBS removal. Patient transferred to [facility] 07/28/11 and has been participating in therapies; however, has had a decline in function and developed difficulty swallowing per RN report. As a result, SLP recieved orders for bedside swallow evaluation.   Type of Study: Bedside swallow evaluation Diet Prior to this Study: Regular;Thin liquids Temperature Spikes Noted: No Respiratory Status: Room air History  of Intubation: No Behavior/Cognition: Alert;Cooperative;Requires cueing Oral Cavity - Dentition: Adequate natural dentition Vision: Functional for self-feeding Patient Positioning: Upright in chair Baseline Vocal Quality: Low vocal intensity Volitional Cough: Cognitively unable to elicit Volitional Swallow: Unable to elicit  Oral Motor/Sensory Function  Overall Oral Motor/Sensory Function: Impaired Labial ROM: Within Functional Limits Labial Symmetry: Within Functional Limits Labial Sensation: Reduced Lingual ROM: Within Functional Limits Lingual Symmetry: Within Functional Limits Facial Symmetry: Within Functional Limits  Consistency Results  Ice Chips Ice chips: Not tested  Thin Liquid Thin Liquid: Within functional limits Presentation: Cup;Straw;Self Fed Other Comments: intermittent, mild delay in swallow with wet vocal quality at times; assist to hold cup at times  Nectar Thick Liquid Nectar Thick Liquid: Not tested  Honey Thick Liquid Honey Thick Liquid: Not tested  Puree Puree: Not tested  Solid Solid: Impaired Presentation: Self Fed (finger food) Oral Phase Impairments:  (prolonged oral phase, liquid wash to assist with cues) Oral Phase Functional Implications: Oral residue  Skilled Therapeutic Intervention:  Pain:0 SLP facilitated session with moderate assist tactile cues to manage cup and patient was mod I with finger foods and demonstrated no overt s/s of aspiration with trails of regular textures and thin liquids; however, SLP suspects that fatigue may impact function throughout the course of an entire meal and therefore continues to recommend soft solids with full supervision.  Individual session  Charlane Ferretti., CCC-SLP [phone]  Jazel Nimmons 08/05/2011,4:22 PM

## 2011-08-05 NOTE — Consult Note (Signed)
[facility] CONSULT NOTE     Reason for Consult: Parkinson's evaluation, increased UE movement    HPI:    [name] is an 78 y.o. male with Parkinson's who has had subthalamic DBS placement for parkinson's unfortunate infection, thus removed and is on long term abx therapy.  His medications have been increased as the device had not been working properly/battery depletion/infection per wife since March. Now that it has been removed, his UE movements have increased while in hospital.    Wife states that he was doing well at home before march 2013 and he was walking with walker, seemed intermittently confused prior to that time . Since March she says that he has more confusion, forgets names of objects easily and significant decrease in overall activity. It was at this time that he had infection and since stimulator removal.  Most of history obtained from chart.  I did return to the room and was able to talk to PT today who felt that the patient is less interactive than last week. He had been participating in Wii bowling with the therapist last week. Now he is sitting in the wheelchair attempting to bathe however he is much less interactive than last week according to the therapist who worked with him last Friday.   Past Medical History  Diagnosis Date  . HYPERLIPIDEMIA-MIXED 02/10/2008  . PARKINSON'S DISEASE 02/10/2008  . HYPERTENSION, BENIGN 02/09/2009  . GASTROINTESTINAL HEMORRHAGE, HX OF 02/10/2008  . ANEMIA 04/29/2010  . FIBRILLATION, ATRIAL 04/29/2010  . VENTRAL HERNIA 04/29/2010  . DEGENERATIVE JOINT DISEASE 04/29/2010  . Gastric ulcer   . Positive PPD   . Internal hemorrhoids   . Hyperplastic colon polyp 2004  . CAD (coronary artery disease)     23.0 x 24 mm Taxus DES mid LAD 2005, DCA diagonal branch 2005  . VENOUS INSUFFICIENCY 02/10/2008    rt  . Shortness of breath   . Osteomyelitis   . Calculus of kidney   . Tuberculosis     78 yrs old    . GERD (gastroesophageal reflux disease)     ulcers hx  . Infection and inflammatory reaction due to device, implant, and graft   . Neuromuscular disorder   . Sundowning     past lumbar decompressive surgery    Past Surgical History  Procedure Date  . Replacement total knee 2002    right  . Nose surgery   . Ankle surgery     left  . Toe amputation     right foot  . Cataract extraction   . Carpal tunnel release     left, open rt  . Tonsillectomy   . Appendectomy   . Coronary angioplasty with stent placement   . Back surgery 2006     back x3  . Deep brain stimulator placement 02    batteries changed 07  . Subthalamic stimulator battery replacement 02/27/2011    Procedure: SUBTHALAMIC STIMULATOR BATTERY REPLACEMENT;  Surgeon: Dorian Heckle, MD;  Location: [facility];  Service: Neurosurgery;  Laterality: Left;  DBS battery replacment x 2 - Left Chest and Abdomen  . Eye muscle surgery     bilat  . Subthalamic stimulator removal 05/22/2011    Procedure: SUBTHALAMIC STIMULATOR REMOVAL;  Surgeon: Maeola Harman, MD;  Location: [facility];  Service: Neurosurgery;  Laterality: N/A;  removal of IPG  .  Subthalamic stimulator removal 07/24/2011    Procedure: SUBTHALAMIC STIMULATOR REMOVAL;  Surgeon: Maeola Harman, MD;  Location: [facility];  Service: Neurosurgery;  Laterality: N/A;  Deep Brain stimulator hardware removal  . Foot amputation through metatarsal     right    Family History  Problem Relation Age of Onset  . Parkinsonism Sister   . Diabetes Brother   . Crohn's disease Daughter   . Heart disease Paternal Grandfather   . Colon cancer Neg Hx     Social History:  reports that he has never smoked. He has never used smokeless tobacco. He reports that he does not drink alcohol or use illicit drugs. This is reviewed as previously documented in chart.  Allergies  Allergen Reactions  . Morphine   . Risperidone And Related   . Tuberculin Tests   . Vancomycin Rash     Medications:    Prior to Admission:  Prescriptions prior to admission  Medication Sig Dispense Refill  . amantadine (SYMMETREL) 100 MG capsule Take 100 mg by mouth 2 (two) times daily.       Marland Kitchen aspirin EC 81 MG tablet Take 81 mg by mouth daily.      . B Complex Vitamins (B COMPLEX PO) Take 1 tablet by mouth 2 (two) times daily.       . carbidopa-levodopa (SINEMET IR) 25-250 MG per tablet Take 1 tablet by mouth 4 (four) times daily. 1 tablet at 6AM, 9AM, 12noon, and 6PM      . carbidopa-levodopa (SINEMET IR) 25-250 MG per tablet Take 2 tablets by mouth daily. 2 tablets at 3PM      . ceftaroline (TEFLARO) 600 MG SOLR Inject 600 mg into the vein every 12 (twelve) hours.      Marland Kitchen dextromethorphan-guaiFENesin (MUCINEX DM) 30-600 MG per 12 hr tablet Take 1 tablet by mouth every 12 (twelve) hours.      . fluticasone (FLONASE) 50 MCG/ACT nasal spray Place 1 spray into the nose daily as needed. For dry nasal passages      . Fluticasone-Salmeterol (ADVAIR) 100-50 MCG/DOSE AEPB Inhale 1 puff into the lungs every 12 (twelve) hours.      Marland Kitchen HYDROcodone-acetaminophen (NORCO) 5-325 MG per tablet Take 1-2 tablets by mouth every 6 (six) hours as needed. 1 tablet for moderate pain, 2 tablets for severe pain.      Marland Kitchen ipratropium-albuterol (DUONEB) 0.5-2.5 (3) MG/3ML SOLN Take 3 mLs by nebulization every 6 (six) hours as needed. Shortness of breath      . meloxicam (MOBIC) 15 MG tablet Take 15 mg by mouth daily.       . midodrine (PROAMATINE) 5 MG tablet Take 5 mg by mouth 2 (two) times daily.       Marland Kitchen omeprazole (PRILOSEC) 40 MG capsule Take 1 capsule (40 mg total) by mouth daily.  30 capsule  3  . PARoxetine (PAXIL) 20 MG tablet Take 1 tablet (20 mg total) by mouth every morning.  30 tablet  11  . polyethylene glycol (MIRALAX / GLYCOLAX) packet Take 17 g by mouth daily as needed. For constipation      . pramipexole (MIRAPEX) 1 MG tablet Take 0.5-1 mg by mouth 4 (four) times daily. 1/2 tablet at 6AM, 9AM, 3PM and  6PM; 1 tab at noon      . Probiotic Product (PROBIOTIC FORMULA PO) Take 1 capsule by mouth daily.       Scheduled:   . amantadine  100 mg Oral BID  . aspirin  EC  81 mg Oral Daily  . B-complex with vitamin C  1 tablet Oral Daily  . carbidopa-levodopa  1 tablet Oral QID  . carbidopa-levodopa  2 tablet Oral Q24H  . feeding supplement  237 mL Oral Q24H  . Fluticasone-Salmeterol  1 puff Inhalation Q12H  . guaiFENesin  600 mg Oral BID  . linezolid  600 mg Oral Q12H  . midodrine  5 mg Oral BID WC  . mupirocin ointment   Nasal BID  . pantoprazole  40 mg Oral Q1200  . polyethylene glycol  17 g Oral Daily  . pramipexole  0.5 mg Oral 5 X Daily  . pyridOXINE  50 mg Oral Daily  . senna-docusate  2 tablet Oral QHS  . DISCONTD: docusate sodium  100 mg Oral BID  . DISCONTD: meloxicam  15 mg Oral Daily    Review of Systems reviewed with wife General ROS: negative for - chills, fatigue, fever or hot flashes Hematological and Lymphatic ROS: negative for - bruising, fatigue, jaundice or pallor Endocrine ROS: negative for - hair pattern changes, hot flashes, mood swings or skin changes Respiratory ROS: negative for - cough, hemoptysis, orthopnea or wheezing Cardiovascular ROS: negative for - dyspnea on exertion, orthopnea, palpitations or shortness of breath Gastrointestinal ROS: negative for - abdominal pain, appetite loss, blood in stools, diarrhea or hematemesis Musculoskeletal ROS: negative for - joint pain, , joint swelling or muscle pain, +joint stiffness Neurological ROS: positive for - gait disturbance, impaired coordination/balance, memory loss, speech problems, tremors and weakness Dermatological ROS: negative for dry skin, pruritus and rash   Blood pressure 122/78, pulse 80, temperature 97.8 F (36.6 C), temperature source Oral, resp. rate 20, height 6\' 1"  (1.854 m), weight 85.2 kg (187 lb 13.3 oz), SpO2 93.00%.   Neurologic Examination:   Mental Status: Alert.  Whispers yes and no.  Able to follow 3 step commands. Bradykinetic. Flat affect, expressionless facial features. Masked facies. Cranial Nerves: II-Visual fields grossly intact. III/IV/VI-Extraocular movements intact.  Pupils reactive bilaterally. V/VII-Face is symmetric, although patient does not smile. Opens mouth, appears symmetric.  VIII-grossly intact IX/X-normal gag XI-bilateral shoulder shrug XII-midline tongue extension Motor: 5/5 bilaterally with increased tone bilaterally Sensory: Pinprick and light touch intact throughout, bilaterally Deep Tendon Reflexes: absent reflexes LE, 1+ UE bilaterally Cerebellum: finger to nose intact, clear pilling rolling bilaterally in upper extremities  Lab Results  Component Value Date/Time   CHOL 114 05/21/2011  9:56 AM    Results for orders placed during the hospital encounter of 07/28/11 (from the past 48 hour(s))  CBC     Status: Abnormal   Collection Time   08/04/11  6:25 AM      Component Value Range Comment   WBC 6.5  4.0 - 10.5 (K/uL)    RBC 3.63 (*) 4.22 - 5.81 (MIL/uL)    Hemoglobin 10.9 (*) 13.0 - 17.0 (g/dL)    HCT 16.1 (*) 09.6 - 52.0 (%)    MCV 91.2  78.0 - 100.0 (fL)    MCH 30.0  26.0 - 34.0 (pg)    MCHC 32.9  30.0 - 36.0 (g/dL)    RDW 04.5  40.9 - 81.1 (%)    Platelets 166  150 - 400 (K/uL)   BASIC METABOLIC PANEL     Status: Abnormal   Collection Time   08/04/11  6:25 AM      Component Value Range Comment   Sodium 138  135 - 145 (mEq/L)    Potassium 4.4  3.5 -  5.1 (mEq/L)    Chloride 105  96 - 112 (mEq/L)    CO2 22  19 - 32 (mEq/L)    Glucose, Bld 89  70 - 99 (mg/dL)    BUN 33 (*) 6 - 23 (mg/dL)    Creatinine, Ser 1.61 (*) 0.50 - 1.35 (mg/dL)    Calcium 9.6  8.4 - 10.5 (mg/dL)    GFR calc non Af Amer 37 (*) >90 (mL/min)    GFR calc Af Amer 43 (*) >90 (mL/min)   URINALYSIS, ROUTINE W REFLEX MICROSCOPIC     Status: Abnormal   Collection Time   08/04/11  1:51 PM      Component Value Range Comment   Color, Urine YELLOW  YELLOW      APPearance CLEAR  CLEAR     Specific Gravity, Urine 1.021  1.005 - 1.030     pH 5.5  5.0 - 8.0     Glucose, UA NEGATIVE  NEGATIVE (mg/dL)    Hgb urine dipstick NEGATIVE  NEGATIVE     Bilirubin Urine NEGATIVE  NEGATIVE     Ketones, ur NEGATIVE  NEGATIVE (mg/dL)    Protein, ur NEGATIVE  NEGATIVE (mg/dL)    Urobilinogen, UA 0.2  0.0 - 1.0 (mg/dL)    Nitrite NEGATIVE  NEGATIVE     Leukocytes, UA SMALL (*) NEGATIVE    URINE MICROSCOPIC-ADD ON     Status: Normal   Collection Time   08/04/11  1:51 PM      Component Value Range Comment   Squamous Epithelial / LPF RARE  RARE     WBC, UA 3-6  <3 (WBC/hpf)   BASIC METABOLIC PANEL     Status: Abnormal   Collection Time   08/05/11  6:22 AM      Component Value Range Comment   Sodium 139  135 - 145 (mEq/L)    Potassium 4.7  3.5 - 5.1 (mEq/L)    Chloride 104  96 - 112 (mEq/L)    CO2 23  19 - 32 (mEq/L)    Glucose, Bld 96  70 - 99 (mg/dL)    BUN 40 (*) 6 - 23 (mg/dL)    Creatinine, Ser 0.96 (*) 0.50 - 1.35 (mg/dL)    Calcium 9.9  8.4 - 10.5 (mg/dL)    GFR calc non Af Amer 33 (*) >90 (mL/min)    GFR calc Af Amer 38 (*) >90 (mL/min)    CT HEAD:  08/02/2011  Bilateral deep brain stimulator removal. Tiny amount of blood  along the previous stimulator courses as expected. No unexpected  bleeding or any mass effect or unexpected edema. No extra-axial  collection.    Assessment/Plan:  78 yo male with long history of PD.  Has had stimulator in the past.  Recently removed due to infection.  Since removal has had decreased functional ability.  May achieve some improvement with medication adjustment but this should be suboptimal since patient was refractory enough to medications to require a stimulator insertion.  Patient has had CT imaging to rule the possibility of an additional intracerebral process contributing to worsening.   Plan: 1.  Patient has a long history of PD management that we are not privy to at this time.  Will obtain these records and  make recommendations based on these findings.  Will contact Dr. Imagene Gurney office.     Gwendolyn Lima. Manson Passey, [facility] [phone]  08/05/2011, 11:07 AM   Patient seen and examined. I agree with the above.  Thana Farr, MD [facility] [phone]  08/05/2011  5:48 PM

## 2011-08-05 NOTE — Progress Notes (Signed)
Physical Therapy Weekly Progress Note  Patient Details  Name: [name] MRN: [id number] Date of Birth: Mar 13, 1929  Today's Date: 08/05/2011  Patient has met 1 of 3 short term goals.  Pt with decline in functional status from eval.  Presenting now with increased dyskinesias, increased perseverative movements, delayed processing, requiring max A for transfers and mobility.  Long term goals downgraded to mod A level.  Patient continues to demonstrate the following deficits: decreased coordination, impaired gait, delayed processing, decreased functional mobility, decreased balance and therefore will continue to benefit from skilled PT intervention to enhance overall performance with activity tolerance, balance, postural control, ability to compensate for deficits and coordination.  Patient not progressing toward long term goals.  See goal revision..  Continue plan of care.  PT Short Term Goals Week 1:  PT Short Term Goal 1 (Week 1): Pt will perform functional transfers with min A PT Short Term Goal 1 - Progress (Week 1): Revised due to lack of progress PT Short Term Goal 2 (Week 1): Pt will perform standing balance for functional tasks with min A PT Short Term Goal 2 - Progress (Week 1): Revised due to lack of progress PT Short Term Goal 3 (Week 1): Pt will gait with min A 100' in controlled environment PT Short Term Goal 3 - Progress (Week 1): Progressing toward goal  Skilled Therapeutic Interventions/Progress Updates:  Ambulation/gait training;Balance/vestibular training;Discharge planning;Functional mobility training;Therapeutic Activities;Patient/family education;Stair training;Pain management;Neuromuscular re-education;DME/adaptive equipment instruction;UE/LE Coordination activities;Wheelchair propulsion/positioning;UE/LE Strength taining/ROM;Therapeutic Exercise   See FIM for current functional status    [name] 08/05/2011, 6:51 AM

## 2011-08-05 NOTE — Progress Notes (Signed)
Physical Therapy Session Note  Patient Details  Name: [name] MRN: [id number] Date of Birth: [birth year]  Today's Date: 08/05/2011 Time: 0801-0901 Time Calculation (min): 60 min  Short Term Goals: Week 1:  PT Short Term Goal 1 (Week 1): Pt will perform functional transfers with min A PT Short Term Goal 1 - Progress (Week 1): Revised due to lack of progress PT Short Term Goal 2 (Week 1): Pt will perform standing balance for functional tasks with min A PT Short Term Goal 2 - Progress (Week 1): Revised due to lack of progress PT Short Term Goal 3 (Week 1): Pt will gait with min A 100' in controlled environment PT Short Term Goal 3 - Progress (Week 1): Progressing toward goal  Skilled Therapeutic Interventions/Progress Updates:  Performed passive stretching and core work in supine in preparation for activity- pt very tight and with constant movement of UE's. Progressed to seated activity working on trunk flexion as he tends to push backwards with sit to stands, initially pt having difficulty placing feet in proper position but more control by end. Sit to stand with mod A best with w/c positioned in front of him as visual target, worked on speed and quick transitions of sit to stand while pt still in flexed posture.  Finished with gait, pt fatigued at end and required one supine rest break during which it was noted his tremors and athetoid type movements had stopped.  Incorporated memory tasks for personal information and trying to promote loud speech, slow processing        Ambulation/gait training;Balance/vestibular training;Discharge planning;Functional mobility training;Therapeutic Activities;Patient/family education;Stair training;Pain management;Neuromuscular re-education;DME/adaptive equipment instruction;UE/LE Coordination activities;Wheelchair propulsion/positioning;UE/LE Strength taining/ROM;Therapeutic Exercise   Therapy Documentation Precautions:  Precautions Precautions:  Fall Precaution Comments: TED hose when OOB Required Braces or Orthoses: Other Brace/Splint Other Brace/Splint: R orthopedic shoe d/t forefoot amputation Restrictions Weight Bearing Restrictions: No    Vital Signs: Therapy Vitals Pulse Rate: 80  (with activity) Resp: 20  BP: 122/78 mmHg Patient Position, if appropriate: Sitting Oxygen Therapy SpO2: 93 % O2 Device: None (Room air) Pain: Pain Assessment Pain Assessment: No/denies pain Mobility: Bed Mobility Supine to Sit: 4: Min assist;3: Mod assist (mod A with fatigue, not thru sidelying) Supine to Sit Details (indicate cue type and reason): mod A to scoot to EOB in preparation for transfer Sitting - Scoot to Edge of Bed: 3: Mod assist Sit to Supine: 3: Mod assist Transfers Sit to Stand: 3: Mod assist Sit to Stand Details: Verbal cues for technique;Manual facilitation for weight shifting;Manual facilitation for placement;Manual facilitation for weight bearing Sit to Stand Details (indicate cue type and reason): from gradually lowered seat height to promote success Stand to Sit: 5: Supervision;4: Min guard (cues to "reach with bottom" to promote flexion) Locomotion : Ambulation Ambulation/Gait Assistance: 4: Min assist Assistive device: Rolling walker Ambulation/Gait Assistance Details: facilitation for weight shift to promote longer step length, increased A for balance when backing up  Trunk/Postural Assessment :    Balance:able to sit EOB close S without falling backwards   Exercises: General Exercises - Lower Extremity Heel Slides: 10 reps;AROM Other Exercises Other Exercises: trunk rotation P and A/AROM Other Exercises: LE stretching Other Exercises: crunches x 10 Other Treatments: Treatments Therapeutic Activity: seated pushing table forward or pushing therapist to promote anteriior translation and forward weight shift for sit to stand  See FIM for current functional status  Therapy/Group: Individual  Therapy  Michaelene Song 08/05/2011, 9:09 AM

## 2011-08-05 NOTE — Progress Notes (Signed)
Occupational Therapy Weekly Progress Note  Patient Details  Name: [name] MRN: [id number] Date of Birth: [birth year]  Today's Date: 08/05/2011 Time: 1102-1205 Time Calculation (min): 63 min  2nd Session Time:  13:30-13:56   Time Calculation (min):  26 min  Weekly Progress:  Pt has made limited progress this week secondary to a decline in his medical status related to his Parkinson's.  Prior to this week he was able to initiate and participate in selfcare tasks more efficiently and was supervision for UB bathing and min assist for dressing.  Mod assist for LB bathing and max assist for dressing.  He was able to transfer with min to mod facilitation, depending on foot placement.  Now however he needs virtually max assist for all functional transfers and most selfcare tasks.  He continues to need max instructional cues for initiation of selfcare tasks and has very delayed initiation of speech as well.   Patient has met 1 of 4 short term goals.  Has had a gradual decline in function which my be related to his Parkinson's disease thus needing max to total assist for dressing tasks and toileting tasks.  Patient continues to demonstrate the following deficits: decreased initiation, decreased coordination, decreased overall strength, decreased static and dynamic sitting and standing balance. and therefore will continue to benefit from skilled OT intervention to enhance overall performance with BADL.  Patient not progressing toward long term goals.  See goal revision..  Continue plan of care.  OT Short Term Goals Week 2:  OT Short Term Goal 1 (Week 2): Pt will perform all bathing with min assist except for sit to stand, mod assist. OT Short Term Goal 2 (Week 2): Pt will perform toilet transfer 3/3 attempts with no more than mod facilitation. OT Short Term Goal 3 (Week 2): Pt will perform UB dressing with min assist to donn pullover shirt and mod assist to donn pants. OT Short Term Goal 4 (Week 2):  Pt will perform 3 grooming tasks with no more than min assist while sitting at the sink.  Skilled Therapeutic Interventions/Progress Updates:   First Session:  Worked on bathing and dressing at the sink sit to stand.  Pt with better initiation and attempt to perform bathing today vs yesterday's session.  Pt still needing max instructional cueing to progress from washing his face to his arms, chest, and abdomen.   Also needing max assist for sit to stand secondary to not bending forward at the waist or positioning his feet up under him far enough.  Needed max assist to initiate dressing UB and total assist to dress his LB as well.  Pt also slightly more verbal and a significant decrease in his dyskinesia compared to previous day.  Second Session:    Worked on forward reaching to promote trunk flexion at the sink.   Had pt reach for target with both the left and right hands, then progressed to performing sit to stand.  Pt able to stand with mod facilitation and max instructional cueing for hand placement and positioning feet after performing the forward reaching.  Prior to reaching he needed max assist for sit to stand secondary to immediately pushing backwards, without any trunk flexion at all.  Also able to initiate combing his hair while sitting in the wheelchair with one instructional cue and comb presented to him. Did drop the comb one time however.   Therapy Documentation Precautions:  Precautions Precautions: Fall Precaution Comments: TED hose when OOB Required Braces or  Orthoses: Other Brace/Splint Other Brace/Splint: R orthopedic shoe d/t forefoot amputation Restrictions Weight Bearing Restrictions: No  Vital Signs: Therapy Vitals Pulse Rate: 80  (with activity) Oxygen Therapy O2 Device: None (Room air) Pain: Pain Assessment Pain Assessment: No/denies pain ADL: ADL Eating: Moderate assistance Where Assessed-Eating: Wheelchair Grooming: Moderate assistance;Maximal cueing Where  Assessed-Grooming: Sitting at sink;Wheelchair Upper Body Bathing: Moderate assistance;Maximal cueing Where Assessed-Upper Body Bathing: Sitting at sink;Wheelchair Lower Body Bathing: Maximal assistance;Maximal cueing Where Assessed-Lower Body Bathing: Standing at sink;Sitting at sink;Wheelchair Upper Body Dressing: Maximal assistance;Maximal cueing Where Assessed-Upper Body Dressing: Sitting at sink;Wheelchair Lower Body Dressing: Maximal cueing;Dependent Where Assessed-Lower Body Dressing: Standing at sink;Sitting at sink;Wheelchair Toileting: Maximal assistance Where Assessed-Toileting: Psychiatrist Transfer: Maximal assistance Toilet Transfer Method: Surveyor, minerals: Gaffer: Maximal assistance Tub/Shower Transfer Method: Engineer, technical sales: Insurance underwriter: Not assessed ADL Comments: Pt with decline since initial eval likely related to Parkinson's.  Needs max instructional cueing and max assist for all dressing tasks and mod assist for bathing.   Exercises: General Exercises - Lower Extremity Heel Slides: 10 reps;AROM Other Exercises Other Exercises: trunk rotation P and A/AROM Other Exercises: LE stretching Other Exercises: crunches x 10 Other Treatments: Treatments Therapeutic Activity: seated pushing table forward or pushing therapist to promote anteriior translation and forward weight shift for sit to stand  See FIM for current functional status  Therapy/Group: Individual Therapy  August Gosser 08/05/2011, 12:58 PM

## 2011-08-05 NOTE — Progress Notes (Signed)
Subjective/Complaints: Alert today. More writhing, dyskinetic movements especially of UE's.  Other ROS reviewed and negative  Objective: Vital Signs: Blood pressure 122/78, pulse 75, temperature 97.8 F (36.6 C), temperature source Oral, resp. rate 20, height 6\' 1"  (1.854 m), weight 85.2 kg (187 lb 13.3 oz), SpO2 93.00%. No results found.  Basename 08/04/11 0625 08/02/11 0944  WBC 6.5 6.7  HGB 10.9* 11.0*  HCT 33.1* 33.2*  PLT 166 197    Basename 08/05/11 0622 08/04/11 0625  NA 139 138  K 4.7 4.4  CL 104 105  CO2 23 22  GLUCOSE 96 89  BUN 40* 33*  CREATININE 1.85* 1.68*  CALCIUM 9.9 9.6   CBG (last 3)  No results found for this basename: GLUCAP:3 in the last 72 hours  Wt Readings from Last 3 Encounters:  07/28/11 85.2 kg (187 lb 13.3 oz)  07/24/11 94.802 kg (209 lb)  07/24/11 94.802 kg (209 lb)    Physical Exam:  General appearance: alert, cooperative and no distress Head: Normocephalic, without obvious abnormality, atraumatic Eyes: conjunctivae/corneas clear. PERRL, EOM's intact. Fundi benign. Ears: normal TM's and external ear canals both ears Nose: Nares normal. Septum midline. Mucosa normal. No drainage or sinus tenderness. Throat: lips, mucosa, and tongue normal; teeth and gums normal Neck: no adenopathy, no carotid bruit, no JVD, supple, symmetrical, trachea midline and thyroid not enlarged, symmetric, no tenderness/mass/nodules Back: symmetric, no curvature. ROM normal. No CVA tenderness. Resp: clear to auscultation bilaterally Cardio: regular rate and rhythm, S1, S2 normal, no murmur, click, rub or gallop GI: soft, non-tender; bowel sounds normal; no masses,  no organomegaly Extremities: extremities normal, atraumatic, no cyanosis or edema Pulses: 2+ and symmetric Skin: Skin color, texture, turgor normal. No rashes or lesions Neurologic: dysarthric speech, masked facies,forward posture. Intentional tremors and dyskinesias noted.  Bradykinesias and very slow  processing this am.  Incision/Wound: wounds clean and intact with staples   Assessment/Plan: 1. Functional deficits secondary to Parkinson's Disease s/p removal of deep brain stimulator due to infection which require 3+ hours per day of interdisciplinary therapy in a comprehensive inpatient rehab setting. Physiatrist is providing close team supervision and 24 hour management of active medical problems listed below. Physiatrist and rehab team continue to assess barriers to discharge/monitor patient progress toward functional and medical goals.  Remove staples   FIM: FIM - Bathing Bathing Steps Patient Completed: Chest;Right Arm;Left Arm;Abdomen;Front perineal area;Right upper leg;Left upper leg Bathing: 1: Total-Patient completes 0-2 of 10 parts or less than 25%  FIM - Upper Body Dressing/Undressing Upper body dressing/undressing steps patient completed: Thread/unthread right sleeve of pullover shirt/dresss;Put head through opening of pull over shirt/dress;Thread/unthread left sleeve of pullover shirt/dress Upper body dressing/undressing: 1: Total-Patient completed less than 25% of tasks FIM - Lower Body Dressing/Undressing Lower body dressing/undressing steps patient completed: Pull underwear up/down;Don/Doff right shoe;Don/Doff left shoe Lower body dressing/undressing: 1: Total-Patient completed less than 25% of tasks  FIM - Toileting Toileting steps completed by patient: Adjust clothing prior to toileting Toileting Assistive Devices: Grab bar or rail for support Toileting: 1: Two helpers  FIM - Diplomatic Services operational officer Devices: Psychiatrist Transfers: 1-Two helpers  FIM - Banker Devices: Bed rails Bed/Chair Transfer: 2: Supine > Sit: Max A (lifting assist/Pt. 25-49%);2: Sit > Supine: Max A (lifting assist/Pt. 25-49%);2: Bed > Chair or W/C: Max A (lift and lower assist);2: Chair or W/C > Bed: Max A (lift and  lower assist)  FIM - Locomotion: Wheelchair Distance: 50 Locomotion:  Wheelchair: 0: Activity did not occur FIM - Locomotion: Ambulation Locomotion: Ambulation Assistive Devices:  (See PT session note) Ambulation/Gait Assistance: Not tested (comment) Locomotion: Ambulation: 2: Travels 50 - 149 ft with minimal assistance (Pt.>75%)  Comprehension Comprehension Mode: Auditory Comprehension: 4-Understands basic 75 - 89% of the time/requires cueing 10 - 24% of the time  Expression Expression Mode: Verbal Expression: 2-Expresses basic 25 - 49% of the time/requires cueing 50 - 75% of the time. Uses single words/gestures.  Social Interaction Social Interaction: 4-Interacts appropriately 75 - 89% of the time - Needs redirection for appropriate language or to initiate interaction.  Problem Solving Problem Solving: 3-Solves basic 50 - 74% of the time/requires cueing 25 - 49% of the time  Memory Memory: 3-Recognizes or recalls 50 - 74% of the time/requires cueing 25 - 49% of the time 1. DVT Prophylaxis/Anticoagulation: Mechanical: Sequential compression devices, below knee Bilateral lower extremities  2. Pain Management: prn tylenol for now to avoid neuro/sedating SE. History of delirium post back surgery. Likely chronic dementia given dx. 3. Mood: monitor for now. Off paxil to avoid SE as on Zyvox. LCSW to follow up for evaluation.  4. Parkinson's disease: Will continue mirapex, sinemet and amantadine. Will contact Dr. Sandria Manly regarding further adjusments as dyskinesias recently increased.   -UA negative to equivocal, ucx pending  -Head CT negative from Saturday.   - Continue midodrine for orthostasis.--did quite well while up yesterday  -will have to tolerate resting hypertension to a certain extent. 5. Staph infection: Zyvox for 6 weeks  6. Constipation: Will schedule miralax daily.  7. ?COPD/reactive airway disease: Will continue advair bid as at home. 8. Renal insufficiency- pressure  supports  -BUN/CR trending up again. Will start IVF if possible given increased dyskinesias- push po 9. Cough- mucinex bid- seems improved  ?residual from intubation. Chest is clear  -parkinson component   LOS (Days) 8 A FACE TO FACE EVALUATION WAS PERFORMED  [name] T 08/05/2011, 8:17 AM

## 2011-08-06 DIAGNOSIS — R269 Unspecified abnormalities of gait and mobility: Secondary | ICD-10-CM

## 2011-08-06 DIAGNOSIS — A4189 Other specified sepsis: Secondary | ICD-10-CM

## 2011-08-06 DIAGNOSIS — IMO0002 Reserved for concepts with insufficient information to code with codable children: Secondary | ICD-10-CM

## 2011-08-06 DIAGNOSIS — G20 Parkinson's disease: Secondary | ICD-10-CM

## 2011-08-06 DIAGNOSIS — R5381 Other malaise: Secondary | ICD-10-CM

## 2011-08-06 DIAGNOSIS — Z5189 Encounter for other specified aftercare: Secondary | ICD-10-CM

## 2011-08-06 LAB — BASIC METABOLIC PANEL
BUN: 42 mg/dL — ABNORMAL HIGH (ref 6–23)
CO2: 20 mEq/L (ref 19–32)
Calcium: 9.5 mg/dL (ref 8.4–10.5)
Chloride: 101 mEq/L (ref 96–112)
Creatinine, Ser: 1.58 mg/dL — ABNORMAL HIGH (ref 0.50–1.35)

## 2011-08-06 NOTE — Progress Notes (Signed)
Occupational Therapy Session Note  Patient Details  Name: [name] MRN: [id number] Date of Birth: 07/18/1929  Today's Date: 08/06/2011 Time: 1102-1202 Time Calculation (min): 60 min  Short Term Goals: Week 2:  OT Short Term Goal 1 (Week 2): Pt will perform all bathing with min assist except for sit to stand, mod assist. OT Short Term Goal 2 (Week 2): Pt will perform toilet transfer 3/3 attempts with no more than mod facilitation. OT Short Term Goal 3 (Week 2): Pt will perform UB dressing with min assist to donn pullover shirt and mod assist to donn pants. OT Short Term Goal 4 (Week 2): Pt will perform 3 grooming tasks with no more than min assist while sitting at the sink.  Skilled Therapeutic Interventions/Progress Updates:    Performed bathing and dressing sit to stand at the sink.  Pt more verbal today and able to initiate more with tasks.  Performed all UB bathing with mod instructional cueing ans supervision.  LB bathing with mod assist sit to stand.  Performed toilet transfer stand pivot with mod facilitation, but needed total assist for hygiene.  With transfers pt gets freezes at times and exhibits increased difficulty moving his LEs.  He also states being aware of this as well.  Able to donn pullover shirt with min assist, but continues to need total assist to get his undergarment protector and pants over his feet.  Once standing he can pull them over his hips with min facilitation and min assist to maintain standing balance.  Therapy Documentation Precautions:  Precautions Precautions: Fall Precaution Comments: TED hose when OOB Required Braces or Orthoses: Other Brace/Splint Other Brace/Splint: R orthopedic shoe d/t forefoot amputation Restrictions Weight Bearing Restrictions: No  Pain: Pain Assessment Pain Assessment: No/denies pain ADL: See FIM for current functional status  Therapy/Group: Individual Therapy  Zahriyah Joo 08/06/2011, 12:44 PM

## 2011-08-06 NOTE — Progress Notes (Signed)
Patient ID: [name], male   DOB: 28-Nov-1929, 78 y.o.   MRN: [id number] Subjective/Complaints: Alert today. Getting up from toilet to bed with RN. Dyskinesias a little better. Other ROS reviewed and negative  Objective: Vital Signs: Blood pressure 140/110, pulse 75, temperature 97.5 F (36.4 C), temperature source Oral, resp. rate 20, height 6\' 1"  (1.854 m), weight 85.2 kg (187 lb 13.3 oz), SpO2 90.00%. No results found.  Basename 08/04/11 0625  WBC 6.5  HGB 10.9*  HCT 33.1*  PLT 166    Basename 08/05/11 0622 08/04/11 0625  NA 139 138  K 4.7 4.4  CL 104 105  CO2 23 22  GLUCOSE 96 89  BUN 40* 33*  CREATININE 1.85* 1.68*  CALCIUM 9.9 9.6   CBG (last 3)  No results found for this basename: GLUCAP:3 in the last 72 hours  Wt Readings from Last 3 Encounters:  07/28/11 85.2 kg (187 lb 13.3 oz)  07/24/11 94.802 kg (209 lb)  07/24/11 94.802 kg (209 lb)    Physical Exam:  General appearance: alert, cooperative and no distress Head: Normocephalic, without obvious abnormality, atraumatic Eyes: conjunctivae/corneas clear. PERRL, EOM's intact. Fundi benign. Ears: normal TM's and external ear canals both ears Nose: Nares normal. Septum midline. Mucosa normal. No drainage or sinus tenderness. Throat: lips, mucosa, and tongue normal; teeth and gums normal Neck: no adenopathy, no carotid bruit, no JVD, supple, symmetrical, trachea midline and thyroid not enlarged, symmetric, no tenderness/mass/nodules Back: symmetric, no curvature. ROM normal. No CVA tenderness. Resp: clear to auscultation bilaterally Cardio: regular rate and rhythm, S1, S2 normal, no murmur, click, rub or gallop GI: soft, non-tender; bowel sounds normal; no masses,  no organomegaly Extremities: extremities normal, atraumatic, no cyanosis or edema Pulses: 2+ and symmetric Skin: Skin color, texture, turgor normal. No rashes or lesions Neurologic: dysarthric speech, masked facies,forward posture. Intentional  tremors and dyskinesias noted.  Bradykinesias and very slow processing this am.  Incision/Wound: wounds clean and intact with staples   Assessment/Plan: 1. Functional deficits secondary to Parkinson's Disease s/p removal of deep brain stimulator due to infection which require 3+ hours per day of interdisciplinary therapy in a comprehensive inpatient rehab setting. Physiatrist is providing close team supervision and 24 hour management of active medical problems listed below. Physiatrist and rehab team continue to assess barriers to discharge/monitor patient progress toward functional and medical goals.  DC plan is likely SNF at this time.   FIM: FIM - Bathing Bathing Steps Patient Completed: Left Arm;Right Arm;Front perineal area;Right upper leg;Left upper leg Bathing: 3: Mod-Patient completes 5-7 33f 10 parts or 50-74%  FIM - Upper Body Dressing/Undressing Upper body dressing/undressing steps patient completed: Thread/unthread left sleeve of pullover shirt/dress Upper body dressing/undressing: 1: Total-Patient completed less than 25% of tasks FIM - Lower Body Dressing/Undressing Lower body dressing/undressing steps patient completed: Pull underwear up/down;Don/Doff right shoe;Don/Doff left shoe Lower body dressing/undressing: 1: Total-Patient completed less than 25% of tasks  FIM - Toileting Toileting steps completed by patient: Adjust clothing prior to toileting Toileting Assistive Devices: Grab bar or rail for support Toileting: 1: Two helpers  FIM - Diplomatic Services operational officer Devices: Psychiatrist Transfers: 1-Two helpers  FIM - Banker Devices: Bed rails Bed/Chair Transfer: 3: Supine > Sit: Mod A (lifting assist/Pt. 50-74%/lift 2 legs;3: Bed > Chair or W/C: Mod A (lift or lower assist);3: Chair or W/C > Bed: Mod A (lift or lower assist)  FIM - Locomotion: Wheelchair Distance: 50 Locomotion: Wheelchair:  1:  Total Assistance/staff pushes wheelchair (Pt<25%) FIM - Locomotion: Ambulation Locomotion: Ambulation Assistive Devices: Walker - Rolling Ambulation/Gait Assistance: 4: Min assist Locomotion: Ambulation: 1: Travels less than 50 ft with minimal assistance (Pt.>75%)  Comprehension Comprehension Mode: Auditory Comprehension: 4-Understands basic 75 - 89% of the time/requires cueing 10 - 24% of the time  Expression Expression Mode: Verbal Expression: 2-Expresses basic 25 - 49% of the time/requires cueing 50 - 75% of the time. Uses single words/gestures.  Social Interaction Social Interaction: 2-Interacts appropriately 25 - 49% of time - Needs frequent redirection.  Problem Solving Problem Solving: 3-Solves basic 50 - 74% of the time/requires cueing 25 - 49% of the time  Memory Memory: 3-Recognizes or recalls 50 - 74% of the time/requires cueing 25 - 49% of the time 1. DVT Prophylaxis/Anticoagulation: Mechanical: Sequential compression devices, below knee Bilateral lower extremities  2. Pain Management: prn tylenol for now to avoid neuro/sedating SE. History of delirium post back surgery. Likely chronic dementia given dx. 3. Mood: monitor for now. Off paxil to avoid SE as on Zyvox. LCSW to follow up for evaluation.  4. Parkinson's disease:  mirapex, sinemet and amantadine.   -Neuro follow up appreciate. i agree that any med ajustments will yield suboptimal results given his hx. He will likely tend to wax and wane as well.  Continue to address other issues including volume statues, po intake, ID, etc  -UA negative to equivocal, ucx still mending.  -Head CT negative from Saturday.   - Continue midodrine for orthostasis.--did quite well while up yesterday  -will have to tolerate resting hypertension to a certain extent. 5. Staph infection: Zyvox for 6 weeks  6. Constipation: senokot s and miralax daily.  7. ?COPD/reactive airway disease: Will continue advair bid as at home. 8. Renal  insufficiency- pressure supports  -IVF restarted. Follow up labs as available 9. Cough- mucinex bid- seems improved  ?residual from intubation. Chest is clear  -parkinson component   LOS (Days) 9 A FACE TO FACE EVALUATION WAS PERFORMED  Garry Bochicchio T 08/06/2011, 7:07 AM

## 2011-08-06 NOTE — Progress Notes (Signed)
Occupational Therapy Session Note  Patient Details  Name: [name] MRN: [id number] Date of Birth: 16-Oct-1929  Today's Date: 08/06/2011 Time: 1332-1400 Time Calculation (min): 28 min  Short Term Goals: Week 2:  OT Short Term Goal 1 (Week 2): Pt will perform all bathing with min assist except for sit to stand, mod assist. OT Short Term Goal 2 (Week 2): Pt will perform toilet transfer 3/3 attempts with no more than mod facilitation. OT Short Term Goal 3 (Week 2): Pt will perform UB dressing with min assist to donn pullover shirt and mod assist to donn pants. OT Short Term Goal 4 (Week 2): Pt will perform 3 grooming tasks with no more than min assist while sitting at the sink.  Skilled Therapeutic Interventions/Progress Updates:    Pt seen for OT treatment this pm.  Noted pt with more dyskinesia this pm compared to morning session.  Practiced forward trunk flexion for reaching target in preparation for sit to stand and functional transfers.  Pt able to perform sit to stand transitions multiple times with mod instructional cueing for hand and foot placement as well as encouragement to lean forward but all with at least mod facilitation.  Pt with increased posterior lean and repulsion when attempting sit to stand.  Wife present at end of session.  Discussed the varying amount of assist he needs from this morning to this afternoon session.  Question if she will be able to provide mod facilitation based on pt's diagnosis and questionable ability to consistently improve.  Therapy Documentation Precautions:  Precautions Precautions: Fall Precaution Comments: TED hose when OOB Required Braces or Orthoses: Other Brace/Splint Other Brace/Splint: R orthopedic shoe d/t forefoot amputation Restrictions Weight Bearing Restrictions: No   Vital Signs: Therapy Vitals Temp: 97.5 F (36.4 C) Temp src: Oral Pulse Rate: 72  Resp: 18  BP: 125/92 mmHg Patient Position, if appropriate: Sitting Oxygen  Therapy SpO2: 100 % O2 Device: None (Room air) Pain: Pain Assessment Pain Assessment: No/denies pain Pain Score: 0-No pain ADL: See FIM for current functional status  Therapy/Group: Individual Therapy  Roseanne Juenger 08/06/2011, 4:02 PM

## 2011-08-06 NOTE — Progress Notes (Signed)
Nutrition Follow-up  Started on IVF over the weekend 2/2 poor PO intake. Intake currently 100% of meals, eating well. Taking Ensure Complete approximately 50% of the time.  Per MD note, dyskinesias a little better.  BSE completed on 5/28 secondary to nursing report of decline in function and difficulty with swallowing. Recommended continuation of Regular diet with thin liquids  Diet Order:  Low Tyramine with protein-redistribution (lower protein intake at breakfast and lunch; higher protein intake at dinner) Supplement: High-Protein snack at HS; Ensure Complete at HS  Meds: Scheduled Meds:   . amantadine  100 mg Oral BID  . aspirin EC  81 mg Oral Daily  . B-complex with vitamin C  1 tablet Oral Daily  . carbidopa-levodopa  1 tablet Oral QID  . carbidopa-levodopa  2 tablet Oral Q24H  . feeding supplement  237 mL Oral Q24H  . Fluticasone-Salmeterol  1 puff Inhalation Q12H  . guaiFENesin  600 mg Oral BID  . linezolid  600 mg Oral Q12H  . midodrine  5 mg Oral BID WC  . mupirocin ointment   Nasal BID  . pantoprazole  40 mg Oral Q1200  . polyethylene glycol  17 g Oral Daily  . pramipexole  0.5 mg Oral 5 X Daily  . pyridOXINE  50 mg Oral Daily  . senna-docusate  2 tablet Oral QHS   Continuous Infusions:   . sodium chloride     PRN Meds:.acetaminophen, albuterol, alum & mag hydroxide-simeth, bisacodyl, diphenhydrAMINE, fluticasone, guaiFENesin-dextromethorphan, HYDROcodone-acetaminophen, ipratropium, menthol-cetylpyridinium, phenol, prochlorperazine, prochlorperazine, prochlorperazine, traZODone  Labs:  CMP     Component Value Date/Time   NA 137 08/06/2011 0638   K 5.0 08/06/2011 0638   CL 101 08/06/2011 0638   CO2 20 08/06/2011 0638   GLUCOSE 92 08/06/2011 0638   BUN 42* 08/06/2011 0638   CREATININE 1.58* 08/06/2011 0638   CALCIUM 9.5 08/06/2011 0638   PROT 8.0 08/02/2011 0944   ALBUMIN 3.7 08/02/2011 0944   AST 30 08/02/2011 0944   ALT 5 08/02/2011 0944   ALKPHOS 102 08/02/2011 0944     BILITOT 0.6 08/02/2011 0944   GFRNONAA 40* 08/06/2011 0638   GFRAA 46* 08/06/2011 0638     Intake/Output Summary (Last 24 hours) at 08/06/11 1019 Last data filed at 08/06/11 4098  Gross per 24 hour  Intake    960 ml  Output    350 ml  Net    610 ml    Weight Status:  82.5 kg, down 2.7 kg x 1 week  Estimated needs:  1950 - 2150 kcal, 60 - 70 grams protein  Nutrition Dx:  Food-Medication Interaction r/t combined ingestion of protein with administration of levodopa that may result in undesirable efficacy AEB suspected interaction exacerbating dyskinesias associated with Parkinson's disease. Ongoing.  Goal:  Pt to consume adequate nutrition and adhere to special dietary restrictions. Met.  Intervention:  Continue current plan of care.  Monitor:  PO intake, weights, labs, I/O's  Adair Laundry Pager #:  [phone]

## 2011-08-06 NOTE — Progress Notes (Signed)
Speech Language Pathology Daily Session Note & Discharge Summary  Patient Details  Name: [name] MRN: [id number] Date of Birth: May 01, 1929  Today's Date: 08/06/2011 Time: 0805-0900 Time Calculation (min): 55 min  Short Term Goals: Week 1:  See BSE for goals  Skilled Therapeutic Interventions: Session focused on skilled treatment of dysphagia with SLP facilitated session with total set up of meal and moderate assist semantic cues to alternate between solids and liquids throughout meal and max assist to manage cup.  Patient self feed Dys.3 textures with utensil and appropriate use of hands with finger foods with increased wait time.  Patient demonstrated throat clear x1 during entire meal.  SLP spike with wife and educated her regarding recommendations of soft solids for meals due to fatigue with the length of the task; however, no observed difficulty with regular textures during short snack yesterday aside from prolonged mastication.    Recommend: Goals met for patient and family education with regard to dysphagia and SLP recommends discharge at this time.  Daily Session Precautions/Restrictions    FIM:  Comprehension Comprehension Mode: Auditory Comprehension: 4-Understands basic 75 - 89% of the time/requires cueing 10 - 24% of the time Expression Expression Mode: Verbal Expression: 3-Expresses basic 50 - 74% of the time/requires cueing 25 - 50% of the time. Needs to repeat parts of sentences. Social Interaction Social Interaction: 2-Interacts appropriately 25 - 49% of time - Needs frequent redirection. Problem Solving Problem Solving: 3-Solves basic 50 - 74% of the time/requires cueing 25 - 49% of the time Memory Memory: 3-Recognizes or recalls 50 - 74% of the time/requires cueing 25 - 49% of the time FIM - Eating Eating Activity: 5: Set-up assist for open containers;5: Set-up assist for cut food;5: Needs verbal cues/supervision General    Pain Pain Assessment Pain  Assessment: No/denies pain Pain Score: 0-No pain  Therapy/Group: Individual Therapy   Speech Language Pathology Discharge Summary  Patient Details  Name: [name] MRN: [id number] Date of Birth: 17-Mar-1929  Today's Date: 08/06/2011  Patient has met 1 of 1 long term goals due to completion of education of family with regards to dysphagia.  Patient to discharge at overall Mod Assist level.  Patient's care partner is independent to provide the necessary dysphagia assistance at discharge.  Reasons goals not met: n/a  Recommendation:  Patient requires no SLP follow up post CIR discharge.  Equipment: none  Reasons for discharge: treatment goals met  Patient/family agrees with progress made and goals achieved: Yes  See FIM for current functional status  Charlane Ferretti., CCC-SLP [phone]  [name] 08/06/2011, 1:45 PM

## 2011-08-06 NOTE — Patient Care Conference (Signed)
Inpatient RehabilitationTeam Conference Note Date: 08/05/2011   Time: 2:30 PM    Patient Name: [name]      Medical Record Number: [id number]  Date of Birth: 08-28-1929 Sex: Male         Room/Bed: 4032/4032-01 Payor Info: Payor: MEDICARE  Plan: MEDICARE PART A AND B  Product Type: *No Product type*     Admitting Diagnosis: Parkinson's Dz removal of Brain stimulator  Admit Date/Time:  07/28/2011  4:43 PM Admission Comments: No comment available   Primary Diagnosis:  <principal problem not specified> Principal Problem: <principal problem not specified>  Patient Active Problem List  Diagnoses Date Noted  . Physical deconditioning 07/31/2011  . Sundowning   . Skin infection, bacterial 06/16/2011  . Dyspnea 02/10/2011  . History of depression 10/02/2010  . ANEMIA 04/29/2010  . FIBRILLATION, ATRIAL 04/29/2010  . UMBILICAL HERNIA 04/29/2010  . VENTRAL HERNIA 04/29/2010  . DEGENERATIVE JOINT DISEASE 04/29/2010  . GASTRIC ULCER, HX OF 04/29/2010  . COLONIC POLYPS, HYPERPLASTIC, HX OF 04/29/2010  . Shortness of breath 02/11/2010  . HYPERTENSION, BENIGN 02/09/2009  . HYPERLIPIDEMIA-MIXED 02/10/2008  . PARKINSON'S DISEASE 02/10/2008  . Coronary Atherosclerosis of Native Coronary Artery 02/10/2008  . VENOUS INSUFFICIENCY 02/10/2008  . GASTROINTESTINAL HEMORRHAGE, HX OF 02/10/2008    Expected Discharge Date: Expected Discharge Date:  (? SNF as d/c plan)  Team Members Present: Physician: Dr. Faith Rogue Case Manager Present: Melanee Spry, RN Social Worker Present: Amada Jupiter, LCSW Nurse Present: Carlean Purl, RN PT Present: Elvia Collum, PT OT Present: Bretta Bang, OT;Ardis Rowan, COTA SLP Present: Feliberto Gottron, SLP Other (Discipline and Name): Tora Duck, PPS Coordinator     Current Status/Progress Goal Weekly Team Focus  Medical   increased dyskinesias lately. slightly dehydrated. no other lab findings found. neuro consult requested today  improve control of  involuntary units  see prior   Bowel/Bladder   incont of bowel/bladder, LBM 5/24 after suppository  timed/prompted toileting to manage b/b max assist. BM every 2 days  adjust bowel regimen for regular bowel pattern, check pt every 2 hours for incontinence   Swallow/Nutrition/ Hydration     Bedside swallow being done at this time d/t concerns about decline in swallow        ADL's   Usually supervision for UB bathing and min assist for UB dressing.  Pt max assist for LB dressing and mod assist for LB bathing,  mod to max assist for transfers stand pivot with his RW  supervision overall for selfcare  selfcare retraining, standing balance and endurance, UE functional use and coordination   Mobility   max A  mod A  functional transfers, activity tolerance   Communication             Safety/Cognition/ Behavioral Observations            Pain   no complaints  <2  monitor   Skin   redness to scrotum and buttocks from incontinence  no new breakdown, decreased redness  ensure pt remains dry and use MGP and EPBC with each episode of incontinence      *See Interdisciplinary Assessment and Plan and progress notes for long and short-term goals  Barriers to Discharge: profound movement disorder    Possible Resolutions to Barriers:  supervision at discharge    Discharge Planning/Teaching Needs:  home with wife providing 24/7 assistance vs. SNF - to meet with wife after team conference      Team Discussion: Pt's function has declined--concern about  level of care required--to discuss d/c planning w/ wife.   Revisions to Treatment Plan: may need to downgrade goals    Continued Need for Acute Rehabilitation Level of Care: The patient requires daily medical management by a physician with specialized training in physical medicine and rehabilitation for the following conditions: Daily direction of a multidisciplinary physical rehabilitation program to ensure safe treatment while eliciting the  highest outcome that is of practical value to the patient.: Yes Daily medical management of patient stability for increased activity during participation in an intensive rehabilitation regime.: Yes Daily analysis of laboratory values and/or radiology reports with any subsequent need for medication adjustment of medical intervention for : Neurological problems;Other;Post surgical problems  [name] 08/06/2011, 10:28 AM

## 2011-08-06 NOTE — Progress Notes (Signed)
Physical Therapy Note  Patient Details  Name: [name] MRN: [id number] Date of Birth: 1929-09-15 Today's Date: 08/06/2011  Time: 900-945 45 minutes  No c/o pain.  Pt session initiated with stretching trunk and hamstrings to increase fwd flexion ROM to facilitate sit to stand.  Sit to stand training with manual facilitation for anterior translation.  Practiced quick sit to stands, partial sits with quick return to standing to facilitate repetition for anterior translation and increase quad activation.  Performance improved with repetition.  Gait training with min A, manual facilitation at hips to encourage increased step length.  Gait fwd/bkwd walking with min A forward, mod A bkwd due to increased posterior lean.  Pt with improved gait and sit to stands today.  Improved vocal quality, able to speak with louder voice without cues.    Individual therapy  Alfonso Carden 08/06/2011, 12:00 PM

## 2011-08-06 NOTE — Progress Notes (Signed)
Social Work Patient ID: [name], male   DOB: January 16, 1930, 78 y.o.   MRN: [id number]  Met this afternoon with patient's wife to review pt's functional status and d/c plans.  Wife tearfully acknowledges that, because of a steady functional decline, she does not feel that she can meet his care needs at home.  She has decided to change the plan to SNF "... I just don't see any other option at this point."   I will prepare FL2 and send out to area facilities.  Will continue to follow for emotional support to both pt and his wife.  Mattheu Brodersen

## 2011-08-06 NOTE — Progress Notes (Signed)
Subjective:  Patient sitting up in chair finishing lunch. Denies any pain. Speech is clearer today. Dyskinesia persists. Wife not in room.  Objective: Current vital signs: BP 140/110  Pulse 75  Temp(Src) 97.5 F (36.4 C) (Oral)  Resp 20  Ht 6\' 1"  (1.854 m)  Wt 82.5 kg (181 lb 14.1 oz)  BMI 24.00 kg/m2  SpO2 90% Vital signs in last 24 hours: Temp:  [97.5 F (36.4 C)-97.6 F (36.4 C)] 97.5 F (36.4 C) (05/29 0620) Pulse Rate:  [58-75] 75  (05/29 0621) Resp:  [19-20] 20  (05/29 0620) BP: (140-175)/(97-110) 140/110 mmHg (05/29 0621) SpO2:  [90 %-93 %] 90 % (05/29 0620) Weight:  [82.5 kg (181 lb 14.1 oz)] 82.5 kg (181 lb 14.1 oz) (05/29 0815)  Intake/Output from previous day: 05/28 0701 - 05/29 0700 In: 960 [P.O.:960] Out: 350 [Urine:350] Intake/Output this shift: Total I/O In: 240 [P.O.:240] Out: -  Nutritional status: Tyramine Restricted  Neurologic Exam: Mental Status: Alert, oriented, thought content appropriate. Bradykinetic. Masked facies.  Able to follow 3 step commands without difficulty. He is able to communicate better today, rather than yes or no yesterday. Cranial Nerves: II: visual fields grossly normal, pupils equal, round, reactive to light and accommodation III,IV, VI: ptosis not present, extra-ocular motions intact bilaterally V,VII: does not smile, but opens mouth and appears that this is symmetric VIII: hearing normal bilaterally IX,X: gag reflex present XII: tongue strength normal  Motor: 5/5 bilaterally with increased tone bilaterally Sensory: Pinprick and light touch intact throughout, bilaterally Deep Tendon Reflexes: 2+ and symmetric throughout Cerebellar: finger to nose intact, clear pilling rolling bilaterally in upper extremities   Lab Results: CBC:  Lab 08/04/11 0625 08/02/11 0944  WBC 6.5 6.7  NEUTROABS -- --  HGB 10.9* 11.0*  HCT 33.1* 33.2*  MCV 91.2 91.7  PLT 166 197   Basic Metabolic Panel:  Lab 08/06/11 1610 08/05/11 0622    NA 137 139  K 5.0 4.7  CL 101 104  CO2 20 23  GLUCOSE 92 96  BUN 42* 40*  CREATININE 1.58* 1.85*  CALCIUM 9.5 9.9  MG -- --  PHOS -- --   Liver Function Tests:  Lab 08/02/11 0944  AST 30  ALT 5  ALKPHOS 102  BILITOT 0.6  PROT 8.0  ALBUMIN 3.7   Urinalysis    Component Value Date/Time   COLORURINE YELLOW 08/04/2011 1351   APPEARANCEUR CLEAR 08/04/2011 1351   LABSPEC 1.021 08/04/2011 1351   PHURINE 5.5 08/04/2011 1351   GLUCOSEU NEGATIVE 08/04/2011 1351   HGBUR NEGATIVE 08/04/2011 1351   BILIRUBINUR NEGATIVE 08/04/2011 1351   KETONESUR NEGATIVE 08/04/2011 1351   PROTEINUR NEGATIVE 08/04/2011 1351   UROBILINOGEN 0.2 08/04/2011 1351   NITRITE NEGATIVE 08/04/2011 1351   LEUKOCYTESUR SMALL* 08/04/2011 1351     Specimen Description URINE, CLEAN CATCH Special Requests NONE Culture Setup Time [id number] Colony Count 75,000 COLONIES/ML Culture Multiple bacterial morphotypes present, none predominant. Suggest appropriate recollection if clinically indicated. Report Status 08/05/2011 FINAL  Medications:  Scheduled:   . amantadine  100 mg Oral BID  . aspirin EC  81 mg Oral Daily  . B-complex with vitamin C  1 tablet Oral Daily  . carbidopa-levodopa  1 tablet Oral QID  . carbidopa-levodopa  2 tablet Oral Q24H  . feeding supplement  237 mL Oral Q24H  . Fluticasone-Salmeterol  1 puff Inhalation Q12H  . guaiFENesin  600 mg Oral BID  . linezolid  600 mg Oral Q12H  . midodrine  5  mg Oral BID WC  . mupirocin ointment   Nasal BID  . pantoprazole  40 mg Oral Q1200  . polyethylene glycol  17 g Oral Daily  . pramipexole  0.5 mg Oral 5 X Daily  . pyridOXINE  50 mg Oral Daily  . senna-docusate  2 tablet Oral QHS    Assessment/Plan:  78yo male with long standing history of parkinson's disease. Stimulator removed recently due to infection. Progression of symptoms since that time. Now in Rehab. Recent u/a concerning for infection while culture showed multiple bacteria. Another culture has  been sent out for review.   Plan: 1) Await urine culture, as if positive will need to be treated to see if PD symptoms show any evidence of  Improvement.   2) I have left a message for call back from [facility]/Dr. Love's office to discuss patients medication's prior to stimulator implant.   Plan of care discussed with patient and wife.  LOS: 9 days   Guy Franco [facility] Pager [phone]  08/06/2011  1:16 PM

## 2011-08-07 ENCOUNTER — Ambulatory Visit: Payer: Medicare Other | Admitting: Internal Medicine

## 2011-08-07 NOTE — Progress Notes (Signed)
Physical Therapy Note  Patient Details  Name: [name] MRN: [id number] Date of Birth: 1930-02-20 Today's Date: 08/07/2011  Time: 900-942 42 minutes  No c/o pain.  Pt with decreased dyskinesias today.  Session began with NMR to facilitate anterior translation and PROM for stretching to increase fwd flexion ROM.  Pt improves with NMR facilitation techniques progressing from max-min A for sit to stand throughout session.  Gait training with obstacle negotiation with min A for RW control around obstacles, pt requires manual facilitation for wt shifts to increase step and stride length bilaterally.  W/c mobility with B LEs 37' with supervision, min A for turns.  Pt limited by fatigue this session and required frequent rest breaks.  Individual therapy   Margrett Kalb 08/07/2011, 12:25 PM

## 2011-08-07 NOTE — Progress Notes (Signed)
Subjective:  Patient with Parkinson's, stimulator had to be removed due to infection. Not going to be able to have reimplantation; therefore we will try to [facility] medical treatment. He states that he feels better. He is sitting up in chair watching tv. More talkative today.  Objective: Current vital signs: BP 154/101  Pulse 79  Temp(Src) 98.4 F (36.9 C) (Oral)  Resp 18  Ht 6\' 1"  (1.854 m)  Wt 82.5 kg (181 lb 14.1 oz)  BMI 24.00 kg/m2  SpO2 96% Vital signs in last 24 hours: Temp:  [97.5 F (36.4 C)-98.4 F (36.9 C)] 98.4 F (36.9 C) (05/30 0535) Pulse Rate:  [72-109] 79  (05/30 0547) Resp:  [18] 18  (05/30 0535) BP: (125-154)/(82-101) 154/101 mmHg (05/30 0547) SpO2:  [96 %-100 %] 96 % (05/30 0535)  Intake/Output from previous day: 05/29 0701 - 05/30 0700 In: 240 [P.O.:240] Out: 400 [Urine:400] Intake/Output this shift: Total I/O In: 720 [P.O.:720] Out: -  Nutritional status: Tyramine Restricted  Neurologic Exam:  Mental Status: Alert, oriented, thought content appropriate.  Speech fluent and more spontenous without prompting without evidence of aphasia.  Able to follow 3 step commands without difficulty. Cranial Nerves: II: visual fields grossly normal, pupils equal, round, reactive to light and accommodation III,IV, VI: ptosis not present, extra-ocular motions intact bilaterally V,VII: smile symmetric, facial light touch sensation normal bilaterally VIII: hearing normal bilaterally IX,X: gag reflex present XI: trapezius strength/neck flexion strength normal bilaterally XII: tongue strength normal  Motor: Right : Upper extremity   5/5    Left:     Upper extremity   5/5  Lower extremity   5/5     Lower extremity   5/5 Tone and bulk:normal tone throughout; no atrophy noted Sensory: Pinprick and light touch intact throughout, bilaterally Deep Tendon Reflexes: 2+ and symmetric throughout Plantars: Right: downgoing   Left: downgoing Cerebellar: normal  finger-to-nose, normal rapid alternating movements and normal heel-to-shin test normal gait and station   Lab Results: CBC:  Lab 08/04/11 0625 08/02/11 0944  WBC 6.5 6.7  NEUTROABS -- --  HGB 10.9* 11.0*  HCT 33.1* 33.2*  MCV 91.2 91.7  PLT 166 197   Basic Metabolic Panel:  Lab 08/06/11 2130 08/05/11 0622  NA 137 139  K 5.0 4.7  CL 101 104  CO2 20 23  GLUCOSE 92 96  BUN 42* 40*  CREATININE 1.58* 1.85*  CALCIUM 9.5 9.9  MG -- --  PHOS -- --   Liver Function Tests:  Lab 08/02/11 0944  AST 30  ALT 5  ALKPHOS 102  BILITOT 0.6  PROT 8.0  ALBUMIN 3.7       Medications:  Scheduled:   . amantadine  100 mg Oral BID  . aspirin EC  81 mg Oral Daily  . B-complex with vitamin C  1 tablet Oral Daily  . carbidopa-levodopa  1 tablet Oral QID  . carbidopa-levodopa  2 tablet Oral Q24H  . feeding supplement  237 mL Oral Q24H  . Fluticasone-Salmeterol  1 puff Inhalation Q12H  . guaiFENesin  600 mg Oral BID  . linezolid  600 mg Oral Q12H  . midodrine  5 mg Oral BID WC  . mupirocin ointment   Nasal BID  . pantoprazole  40 mg Oral Q1200  . polyethylene glycol  17 g Oral Daily  . pramipexole  0.5 mg Oral 5 X Daily  . pyridOXINE  50 mg Oral Daily  . senna-docusate  2 tablet Oral QHS    Assessment/Plan:  Patient Active Hospital Problem List: PARKINSON'S DISEASE (02/10/2008)   Assessment/Plan:   -Continue current medications, I would have him f/u with Dr. Sandria Manly once discharged. I did try to call and confirm outpatient doses and plan of                           care to Dr. Imagene Gurney office, but have not heard back as of now. F/U as suggested.   -Await Urine culture and treat if infected as this may clear some of his parkinsonian symptoms.  No further neurologic intervention is recommended at this time.  If further questions arise, please call or page at that time.  Thank you for allowing neurology to participate in the care of this patient.   LOS: 10 days   Guy Franco  [facility] Pager [phone]  08/07/2011  11:41 AM

## 2011-08-07 NOTE — Progress Notes (Signed)
Patient ID: [name], male   DOB: February 06, 1930, 78 y.o.   MRN: [id number] Patient ID: [name], male   DOB: [birth year], 78 y.o.   MRN: [id number] Subjective/Complaints: Alert today. Up with therapies. No significant changes Other ROS reviewed and negative  Objective: Vital Signs: Blood pressure 154/101, pulse 79, temperature 98.4 F (36.9 C), temperature source Oral, resp. rate 18, height 6\' 1"  (1.854 m), weight 82.5 kg (181 lb 14.1 oz), SpO2 96.00%. No results found. No results found for this basename: WBC:2,HGB:2,HCT:2,PLT:2 in the last 72 hours  Basename 08/06/11 0638 08/05/11 0622  NA 137 139  K 5.0 4.7  CL 101 104  CO2 20 23  GLUCOSE 92 96  BUN 42* 40*  CREATININE 1.58* 1.85*  CALCIUM 9.5 9.9   CBG (last 3)  No results found for this basename: GLUCAP:3 in the last 72 hours  Wt Readings from Last 3 Encounters:  08/06/11 82.5 kg (181 lb 14.1 oz)  07/24/11 94.802 kg (209 lb)  07/24/11 94.802 kg (209 lb)    Physical Exam:  General appearance: alert, cooperative and no distress Head: Normocephalic, without obvious abnormality, atraumatic Eyes: conjunctivae/corneas clear. PERRL, EOM's intact. Fundi benign. Ears: normal TM's and external ear canals both ears Nose: Nares normal. Septum midline. Mucosa normal. No drainage or sinus tenderness. Throat: lips, mucosa, and tongue normal; teeth and gums normal Neck: no adenopathy, no carotid bruit, no JVD, supple, symmetrical, trachea midline and thyroid not enlarged, symmetric, no tenderness/mass/nodules Back: symmetric, no curvature. ROM normal. No CVA tenderness. Resp: clear to auscultation bilaterally Cardio: regular rate and rhythm, S1, S2 normal, no murmur, click, rub or gallop GI: soft, non-tender; bowel sounds normal; no masses,  no organomegaly Extremities: extremities normal, atraumatic, no cyanosis or edema Pulses: 2+ and symmetric Skin: Skin color, texture, turgor normal. No rashes or lesions Neurologic:  dysarthric speech, masked facies,forward posture. Intentional tremors and dyskinesias noted especially in UE's.  Incision/Wound: wounds clean and intact with staples   Assessment/Plan: 1. Functional deficits secondary to Parkinson's Disease s/p removal of deep brain stimulator due to infection which require 3+ hours per day of interdisciplinary therapy in a comprehensive inpatient rehab setting. Physiatrist is providing close team supervision and 24 hour management of active medical problems listed below. Physiatrist and rehab team continue to assess barriers to discharge/monitor patient progress toward functional and medical goals.  DC plan is SNF   FIM: FIM - Bathing Bathing Steps Patient Completed: Chest;Right Arm;Left Arm;Abdomen;Front perineal area;Right upper leg;Left upper leg Bathing: 3: Mod-Patient completes 5-7 29f 10 parts or 50-74%  FIM - Upper Body Dressing/Undressing Upper body dressing/undressing steps patient completed: Thread/unthread right sleeve of pullover shirt/dresss;Thread/unthread left sleeve of pullover shirt/dress;Put head through opening of pull over shirt/dress Upper body dressing/undressing: 4: Min-Patient completed 75 plus % of tasks FIM - Lower Body Dressing/Undressing Lower body dressing/undressing steps patient completed: Pull underwear up/down;Don/Doff right shoe;Don/Doff left shoe Lower body dressing/undressing: 1: Total-Patient completed less than 25% of tasks  FIM - Toileting Toileting steps completed by patient: Adjust clothing prior to toileting Toileting Assistive Devices: Grab bar or rail for support Toileting: 1: Total-Patient completed zero steps, helper did all 3  FIM - Diplomatic Services operational officer Devices: Therapist, music Transfers: 3-To toilet/BSC: Mod A (lift or lower assist)  FIM - Banker Devices: Bed rails Bed/Chair Transfer: 3: Bed > Chair or W/C: Mod A (lift or lower  assist);3: Chair or W/C > Bed: Mod A (lift or lower  assist)  FIM - Locomotion: Wheelchair Distance: 50 Locomotion: Wheelchair: 1: Total Assistance/staff pushes wheelchair (Pt<25%) FIM - Locomotion: Ambulation Locomotion: Ambulation Assistive Devices: Designer, industrial/product Ambulation/Gait Assistance: 4: Min assist Locomotion: Ambulation: 2: Travels 50 - 149 ft with minimal assistance (Pt.>75%)  Comprehension Comprehension Mode: Auditory Comprehension: 3-Understands basic 50 - 74% of the time/requires cueing 25 - 50%  of the time  Expression Expression Mode: Verbal Expression: 3-Expresses basic 50 - 74% of the time/requires cueing 25 - 50% of the time. Needs to repeat parts of sentences.  Social Interaction Social Interaction: 2-Interacts appropriately 25 - 49% of time - Needs frequent redirection.  Problem Solving Problem Solving: 2-Solves basic 25 - 49% of the time - needs direction more than half the time to initiate, plan or complete simple activities  Memory Memory: 3-Recognizes or recalls 50 - 74% of the time/requires cueing 25 - 49% of the time 1. DVT Prophylaxis/Anticoagulation: Mechanical: Sequential compression devices, below knee Bilateral lower extremities  2. Pain Management: prn tylenol for now to avoid neuro/sedating SE. History of delirium post back surgery. Likely chronic dementia given dx. 3. Mood: monitor for now. Off paxil to avoid SE as on Zyvox. LCSW to follow up for evaluation.  4. Parkinson's disease:  mirapex, sinemet and amantadine.   -Neuro follow up appreciated. i agree that any med ajustments will yield suboptimal results given his hx. He will likely tend to wax and wane as well.  Neuro to review Dr. Imagene Gurney records. In the meantime, continue to address other issues including volume statues, po intake, ID, etc  -UA negative to equivocal, ucx still pending  -Head CT negative from Saturday.   - Continue midodrine for orthostasis.--did quite well while up  yesterday  -will have to tolerate resting hypertension to a certain extent. 5. Staph infection: Zyvox for 6 weeks  6. Constipation: senokot s and miralax daily.  7. ?COPD/reactive airway disease: Will continue advair bid as at home. 8. Renal insufficiency- pressure supports  -IVF restarted. Follow up labs as available 9. Cough- mucinex bid- seems improved  ?residual from intubation. Chest is clear  -parkinson component   LOS (Days) 10 A FACE TO FACE EVALUATION WAS PERFORMED  Ramiyah Mcclenahan T 08/07/2011, 8:43 AM

## 2011-08-07 NOTE — Progress Notes (Signed)
Occupational Therapy Session Note  Patient Details  Name: [name] MRN: [id number] Date of Birth: [birth year]  Today's Date: 08/07/2011 Time: 4098-1191 Time Calculation (min): 29 min  Short Term Goals: Week 2:  OT Short Term Goal 1 (Week 2): Pt will perform all bathing with min assist except for sit to stand, mod assist. OT Short Term Goal 2 (Week 2): Pt will perform toilet transfer 3/3 attempts with no more than mod facilitation. OT Short Term Goal 3 (Week 2): Pt will perform UB dressing with min assist to donn pullover shirt and mod assist to donn pants. OT Short Term Goal 4 (Week 2): Pt will perform 3 grooming tasks with no more than min assist while sitting at the sink.  Skilled Therapeutic Interventions/Progress Updates:    Performed UE ergonometer for 6 mins  With level 7 resistance initially and RPMs set at around 25 to 30.  Pt able to perform reciprical arm movement without any noticeable dyskinesia.  Initially reported no pain with the left shoulder during this task.  After a brief rest period at 4 mins pt transitioned to peddling the bike backwards and began to report pain in the left shoulder after approximately 2 mins of peddling.  Stopped immediately and transitioned to working on sit to stand with forward trunk flexion.  Pt able to perform sit to stand with mod assist, as long as he is physically cued to bend forward.  When he doesn't it becomes max to total assist.  Returned to room and worked on standing at the sink to change his incontinence brief, also with mod assist to perform sit to stand.    Therapy Documentation Precautions:  Precautions Precautions: Fall Precaution Comments: TED hose when OOB Required Braces or Orthoses: Other Brace/Splint Other Brace/Splint: R orthopedic shoe d/t forefoot amputation Restrictions Weight Bearing Restrictions: No  Pain: Pain Assessment Pain Assessment: 0-10 Pain Score:   2 Pain Type: Chronic pain Pain Location: Shoulder Pain  Orientation: Left Pain Intervention(s): Repositioned ADL: See FIM for current functional status  Therapy/Group: Individual Therapy  [name] 08/07/2011, 4:16 PM

## 2011-08-07 NOTE — Progress Notes (Signed)
Occupational Therapy Session Note  Patient Details  Name: [name] MRN: [id number] Date of Birth: November 04, 1929  Today's Date: 08/07/2011 Time: 4098-1191 Time Calculation (min): 48 min  Skilled Therapeutic Interventions/Progress Updates:    Pt seen for individual treatment session with focus on ADL retraining for bathe and dress at sink level. Pt reports lethargy today (had PT earlier). Pt was overall total assist LB dress/bathe and +2 total assist for sit to stand during clothing negotiation secondary to retropulsion and dyskinesia. Pt required VC/TC's as well during ADL's to initiate tasks. Pt was noted to drool multiple times during session and req VC to wipe chin with wash cloth/paper towel noted.   Therapy Documentation Precautions:  Precautions Precautions: Fall Precaution Comments: TED hose when OOB Required Braces or Orthoses: Other Brace/Splint Other Brace/Splint: R orthopedic shoe d/t forefoot amputation Restrictions Weight Bearing Restrictions: No     Pain: Pain Assessment Pain Assessment: 0-10 Pain Score:   0 Faces Pain Scale: No hurt ADL: ADL Equipment Provided: Reacher;Other (comment) (W/C & sink level ADL's) Eating: Moderate assistance Where Assessed-Eating: Wheelchair Grooming: Moderate assistance;Maximal cueing Where Assessed-Grooming: Sitting at sink;Wheelchair Upper Body Bathing: Moderate assistance;Maximal cueing Where Assessed-Upper Body Bathing: Sitting at sink;Wheelchair Lower Body Bathing: Maximal assistance;Maximal cueing Where Assessed-Lower Body Bathing: Standing at sink;Sitting at sink;Wheelchair Upper Body Dressing: Maximal assistance;Maximal cueing Where Assessed-Upper Body Dressing: Sitting at sink;Wheelchair Lower Body Dressing: Maximal cueing;Dependent Where Assessed-Lower Body Dressing: Standing at sink;Sitting at sink;Wheelchair Toileting: Maximal assistance Where Assessed-Toileting: Psychiatrist Transfer: Maximal  assistance Toilet Transfer Method: Surveyor, minerals: Gaffer: Maximal assistance Tub/Shower Transfer Method: Engineer, technical sales: Insurance underwriter: Not assessed ADL Comments: Pt with decline since initial eval likely related to Parkinson's.  Needs max instructional cueing and max assist for all dressing tasks and mod assist for bathing.        See FIM for current functional status  Therapy/Group: Individual Therapy  Alm Bustard 08/07/2011, 12:05 PM

## 2011-08-07 NOTE — Progress Notes (Signed)
Occupational Therapy Session Note  Patient Details  Name: [name] MRN: [id number] Date of Birth: [birth year]  Today's Date: 08/07/2011 Time: 1191-4782 Time Calculation (min): 29 min  Short Term Goals: Week 2:  OT Short Term Goal 1 (Week 2): Pt will perform all bathing with min assist except for sit to stand, mod assist. OT Short Term Goal 2 (Week 2): Pt will perform toilet transfer 3/3 attempts with no more than mod facilitation. OT Short Term Goal 3 (Week 2): Pt will perform UB dressing with min assist to donn pullover shirt and mod assist to donn pants. OT Short Term Goal 4 (Week 2): Pt will perform 3 grooming tasks with no more than min assist while sitting at the sink.  Skilled Therapeutic Interventions/Progress Updates:    Pt seen for second OT individual treatment session this am. Dyskinesia inhibiting fluid movement and pt requiring increased assistance with tasks noted. Focused on reaching for objects/target with forward trunk flexion in preparation for sit to stand and functional transfers for ADL's. Pt only able to perform sit to stand x1 secondary to c/o lethargy/feeling tired with max instructional cues (placeme, safety, sequencingt; encouragement to lean forward) with +2 total assist secondary to increased posterior lean and repulsion when attempting sit to stand. Pt also w/ c/o L shoulder pain and point tenderness at infraspinatus and supraspinatus tendons when he attempt WB through Bilat UE's in prep for sit-stand from w/c. RN was made aware and gave meds. Treating OT provided cross friction massage/trigger point release x31min and applied ice at conclusion of session.   Therapy Documentation Precautions:  Precautions Precautions: Fall Precaution Comments: TED hose when OOB Required Braces or Orthoses: Other Brace/Splint Other Brace/Splint: R orthopedic shoe d/t forefoot amputation Restrictions Weight Bearing Restrictions: No     Pain: Pain Assessment Pain Assessment:  0-10 Pain Score:   6 Faces Pain Scale: No hurt Pain Location: Shoulder Pain Orientation: Left Pain Onset: With Activity Pain Intervention(s): Medication (See eMAR) ADL: ADL Equipment Provided: Reacher;Other (comment) (W/C & sink level ADL's) Eating: Moderate assistance Where Assessed-Eating: Wheelchair Grooming: Moderate assistance;Maximal cueing Where Assessed-Grooming: Sitting at sink;Wheelchair Upper Body Bathing: Moderate assistance;Maximal cueing Where Assessed-Upper Body Bathing: Sitting at sink;Wheelchair Lower Body Bathing: Maximal assistance;Maximal cueing Where Assessed-Lower Body Bathing: Standing at sink;Sitting at sink;Wheelchair Upper Body Dressing: Maximal assistance;Maximal cueing Where Assessed-Upper Body Dressing: Sitting at sink;Wheelchair Lower Body Dressing: Maximal cueing;Dependent Where Assessed-Lower Body Dressing: Standing at sink;Sitting at sink;Wheelchair Toileting: Maximal assistance Where Assessed-Toileting: Psychiatrist Transfer: Maximal assistance Toilet Transfer Method: Surveyor, minerals: Gaffer: Maximal assistance Tub/Shower Transfer Method: Engineer, technical sales: Insurance underwriter: Not assessed ADL Comments: Pt with decline since initial eval likely related to Parkinson's.  Needs max instructional cueing and max assist for all dressing tasks and mod assist for bathing.        See FIM for current functional status  Therapy/Group: Individual Therapy  Alm Bustard 08/07/2011, 12:16 PM

## 2011-08-07 NOTE — Discharge Instructions (Signed)
Inpatient Rehab Discharge Instructions  [facility] Discharge date and time:    Activities/Precautions/ Functional Status: Activity: {discharge activity:18261} Diet: {diet:18262} Wound Care: {wound care:18263} Functional status:  ___ No restrictions     ___ Walk up steps independently ___ 24/7 supervision/assistance   ___ Walk up steps with assistance ___ Intermittent supervision/assistance  ___ Bathe/dress independently ___ Walk with walker     ___ Bathe/dress with assistance ___ Walk Independently    ___ Shower independently ___ Walk with assistance    ___ Shower with assistance ___ No alcohol     ___ Return to work/school ________  Special Instructions:    My questions have been answered and I understand these instructions. I will adhere to these goals and the provided educational materials after my discharge from the hospital.  Patient/Caregiver Signature _______________________________ Date __________  Clinician Signature _______________________________________ Date __________  Please bring this form and your medication list with you to all your follow-up doctor's appointments.

## 2011-08-07 NOTE — Progress Notes (Signed)
Physical Therapy Note  Patient Details  Name: RYLAND TUNGATE MRN: [id number] Date of Birth: [birth year] Today's Date: 08/07/2011  Time: 1400-1430 30 minutes  No c/o pain.  NMR for anterior translation and forward wt shifts for sit <> stand.  Pt able to perform sit to stand with min A by end of session with manual facilitation at hips and tactile cuing to increase B quad activation to reduce reliance on UEs for sit to stand.  Pt continues to require manual cues for stand to sit for forward trunk flexion and knee flexion to control descent.  Gait training with obstacle negotiation focusing on turning and backward walking, pt continues to require min A with turns, mod A with bkwd walking due to tendency for posterior lean.  Pt with improved activity tolerance this afternoon.  Individual therapy   Rayland Hamed 08/07/2011, 3:47 PM

## 2011-08-07 NOTE — Care Management Note (Signed)
Per State Regulation 482.30 This chart was reviewed for medical necessity with respect to the patient's Admission/Duration of stay. Pt participating txs, but, limited by shakiness.   Progress slower than originally thought.  Plan for d/c is now SNF.     [name]                 Nurse Care Manager              Next Review: 08/11/11

## 2011-08-08 DIAGNOSIS — Z5189 Encounter for other specified aftercare: Secondary | ICD-10-CM

## 2011-08-08 DIAGNOSIS — R5381 Other malaise: Secondary | ICD-10-CM

## 2011-08-08 DIAGNOSIS — G20 Parkinson's disease: Secondary | ICD-10-CM

## 2011-08-08 DIAGNOSIS — A4189 Other specified sepsis: Secondary | ICD-10-CM

## 2011-08-08 DIAGNOSIS — G20A1 Parkinson's disease without dyskinesia, without mention of fluctuations: Secondary | ICD-10-CM

## 2011-08-08 DIAGNOSIS — R269 Unspecified abnormalities of gait and mobility: Secondary | ICD-10-CM

## 2011-08-08 LAB — BASIC METABOLIC PANEL
BUN: 35 mg/dL — ABNORMAL HIGH (ref 6–23)
CO2: 20 mEq/L (ref 19–32)
Calcium: 9.3 mg/dL (ref 8.4–10.5)
Creatinine, Ser: 1.54 mg/dL — ABNORMAL HIGH (ref 0.50–1.35)
GFR calc non Af Amer: 41 mL/min — ABNORMAL LOW (ref >90)
Glucose, Bld: 88 mg/dL (ref 70–99)

## 2011-08-08 MED ORDER — PRAMIPEXOLE DIHYDROCHLORIDE 0.25 MG PO TABS
0.2500 mg | ORAL_TABLET | Freq: Every day | ORAL | Status: DC
  Administered 2011-08-09 – 2011-08-14 (×28): 0.25 mg via ORAL
  Filled 2011-08-08 (×32): qty 1

## 2011-08-08 NOTE — Progress Notes (Signed)
Called wife Delorise Shiner with update.  Pt was able to feed himself today and ate 100% of both breakfast and lunch.

## 2011-08-08 NOTE — Progress Notes (Signed)
Occupational Therapy Session Note  Patient Details  Name: [name] MRN: [id number] Date of Birth: 01/29/1930  Today's Date: 08/08/2011 Time: 4098-1191 Time Calculation (min): 48 min  Short Term Goals: Week 2:  OT Short Term Goal 1 (Week 2): Pt will perform all bathing with min assist except for sit to stand, mod assist. OT Short Term Goal 2 (Week 2): Pt will perform toilet transfer 3/3 attempts with no more than mod facilitation. OT Short Term Goal 3 (Week 2): Pt will perform UB dressing with min assist to donn pullover shirt and mod assist to donn pants. OT Short Term Goal 4 (Week 2): Pt will perform 3 grooming tasks with no more than min assist while sitting at the sink.  Skilled Therapeutic Interventions/Progress Updates:    Pt worked on sit to stand and standing balance with mod facilitation and max instructional cueing for technique and sequence.  Pt with frequent LOB posteriorly in standing when attempting to toss an object, requiring mod assistance to regain his balance.  Also worked on forward reaching in sitting and standing to help facilitate forward trunk flexion.  Returned to the room for changing of brief secondary to being full and shorts being wet.  Needed mod facilitation to pull pants over the hips while standing at the sink.    Therapy Documentation Precautions:  Precautions Precautions: Fall Precaution Comments: TED hose when OOB Required Braces or Orthoses: Other Brace/Splint Other Brace/Splint: R orthopedic shoe d/t forefoot amputation Restrictions Weight Bearing Restrictions: No   Pain: Pain Assessment Pain Assessment: No/denies pain  ADL: See FIM for current functional status  Therapy/Group: Individual Therapy  [name] 08/08/2011, 4:03 PM

## 2011-08-08 NOTE — Progress Notes (Signed)
Physical Therapy Session Note  Patient Details  Name: [name] MRN: [id number] Date of Birth: 04/11/1929  Today's Date: 08/08/2011 Time: 4098-1191 Time Calculation (min): 30 min  Short Term Goals: Week 1:  PT Short Term Goal 1 (Week 1): Pt will perform functional transfers with min A PT Short Term Goal 1 - Progress (Week 1): Revised due to lack of progress PT Short Term Goal 2 (Week 1): Pt will perform standing balance for functional tasks with min A PT Short Term Goal 2 - Progress (Week 1): Revised due to lack of progress PT Short Term Goal 3 (Week 1): Pt will gait with min A 100' in controlled environment PT Short Term Goal 3 - Progress (Week 1): Progressing toward goal Week 2:     Skilled Therapeutic Interventions/Progress Updates:    Patient participated in therapuetic activities to include wheelchair<>mat transfers, sit<>supine transfers and supine activities for trunk rotation, upper and lower trunk flexion and general mobility for decreased rigidity.  He worked on sit<>stand with mod assist for anterior translation of weight shift due to retropulsion tendency.  Needed min cueing for hand placement and anterior weight shift.  Improved center of gravity over base of support working in smaller ranges of stand to sit transfer.  Pt. Ambulated with walker and min assist x 163' with cues for proximity to walker and increased foot clearance.  Patient was dyspneic after ambulation, but denied excessive fatigue and improved to baseline within 1 minute with cues for slower deep breaths.  Was tearful after session and reported missed his wife.  RN was informed and reported she was on her way to visit. Left in room with RN with quick release belt and call bell in reach. Therapy Documentation Precautions:  Precautions Precautions: Fall Precaution Comments: TED hose when OOB Required Braces or Orthoses: Other Brace/Splint Other Brace/Splint: R orthopedic shoe d/t forefoot  amputation Restrictions Weight Bearing Restrictions: No   Pain:  Patient complained of neck pain minimal to moderate in intensity.  Reported had pain meds this am and denied further need for meds this pm.  See FIM for current functional status  Therapy/Group: Individual Therapy  Lynita Groseclose,CYNDI 08/08/2011, 3:18 PM

## 2011-08-08 NOTE — Progress Notes (Signed)
Dr. Sandria Manly called regarding medication adjustment.  He recommended cutting down mirapex dose to help with dyskinesias. It will take 5-7 days to see full effects of medication adjustment.

## 2011-08-08 NOTE — Progress Notes (Signed)
Physical Therapy Note  Patient Details  Name: JAHSIR RAMA MRN: [id number] Date of Birth: 02-Sep-1929 Today's Date: 08/08/2011  Time: 800-855 55 minutes  Pt c/o neck stiffness, RN made aware, AAROM as tolerated.  NMR to increase anterior pelvic tilt to promote anterior wt shifts for ease with sit to stands.  Multiple repetitions with pt progressing from mod to min A with manual facilitation at hips and cues for proper LE and UE placement.  Stair training to promote fwd wt shifts and getting wt over toes in standing.  Pt performed 3 steps x 2 with min A, cues for safety with foot placement.  Gait training 2 x 100 feet with manual facilitation to increase wt shifts to increase step and stride length bilaterally.  Bathroom mobility, balance and transfers with min A with RW.  Min A for RW control in tight spaces.  Pt requires increased cuing and mod A for balance and safety when fatigued or internally distracted.  Pt with noted improved activity tolerance this session.  Required less prolonged rest between activities.  Individual therapy   Smitty Ackerley 08/08/2011, 3:07 PM

## 2011-08-08 NOTE — Progress Notes (Signed)
Patient ID: [name], male   DOB: [birth year], 78 y.o.   MRN: [id number] Subjective/Complaints: Alert today. Up with therapies. No significant changes Other ROS reviewed, + cough, - SOB  Objective: Vital Signs: Blood pressure 155/107, pulse 105, temperature 97.3 F (36.3 C), temperature source Oral, resp. rate 18, height 6\' 1"  (1.854 m), weight 82.5 kg (181 lb 14.1 oz), SpO2 100.00%. No results found. No results found for this basename: WBC:2,HGB:2,HCT:2,PLT:2 in the last 72 hours  Basename 08/08/11 0631 08/06/11 0638  NA 134* 137  K 4.3 5.0  CL 102 101  CO2 20 20  GLUCOSE 88 92  BUN 35* 42*  CREATININE 1.54* 1.58*  CALCIUM 9.3 9.5   CBG (last 3)  No results found for this basename: GLUCAP:3 in the last 72 hours  Wt Readings from Last 3 Encounters:  08/06/11 82.5 kg (181 lb 14.1 oz)  07/24/11 94.802 kg (209 lb)  07/24/11 94.802 kg (209 lb)    Physical Exam:  General appearance: alert, cooperative and no distress Head: Normocephalic, without obvious abnormality, atraumatic Eyes:  Ears:  Nose: Nares normal. Septum midline. Mucosa normal. No drainage or sinus tenderness. Throat: lips, mucosa, and tongue normal; teeth and gums normal Neck: no adenopathy, no carotid bruit, no JVD, supple, symmetrical, trachea midline and thyroid not enlarged, symmetric, no tenderness/mass/nodules Back: symmetric, no curvature. ROM normal. No CVA tenderness. Resp: clear to auscultation bilaterally Cardio: regular rate and rhythm, S1, S2 normal, no murmur, click, rub or gallop GI: soft, non-tender; bowel sounds normal; no masses,  no organomegaly Extremities: extremities normal, atraumatic, no cyanosis or edema Pulses: 2+ and symmetric Skin: Skin color, texture, turgor normal. No rashes or lesions Neurologic: dysarthric speech, masked facies,forward posture. Intentional tremors and dyskinesias noted especially in UE's.  Incision/Wound: wounds clean and intact with  staples   Assessment/Plan: 1. Functional deficits secondary to Parkinson's Disease s/p removal of deep brain stimulator due to infection which require 3+ hours per day of interdisciplinary therapy in a comprehensive inpatient rehab setting. Physiatrist is providing close team supervision and 24 hour management of active medical problems listed below. Physiatrist and rehab team continue to assess barriers to discharge/monitor patient progress toward functional and medical goals.  DC plan is SNF   FIM: FIM - Bathing Bathing Steps Patient Completed: Chest;Right Arm;Left Arm;Abdomen;Front perineal area;Right upper leg;Left upper leg Bathing: 3: Mod-Patient completes 5-7 15f 10 parts or 50-74%  FIM - Upper Body Dressing/Undressing Upper body dressing/undressing steps patient completed: Thread/unthread right sleeve of pullover shirt/dresss;Thread/unthread left sleeve of pullover shirt/dress;Put head through opening of pull over shirt/dress;Pull shirt over trunk Upper body dressing/undressing: 4: Min-Patient completed 75 plus % of tasks FIM - Lower Body Dressing/Undressing Lower body dressing/undressing steps patient completed: Thread/unthread left pants leg;Thread/unthread right pants leg;Pull pants up/down;Fasten/unfasten pants;Don/Doff left shoe;Fasten/unfasten right shoe Lower body dressing/undressing: 1: Total-Patient completed less than 25% of tasks (+2 total assist to adjust clothing (sit-stand today))  FIM - Toileting Toileting steps completed by patient: Adjust clothing prior to toileting Toileting Assistive Devices: Grab bar or rail for support Toileting: 1: Total-Patient completed zero steps, helper did all 3  FIM - Diplomatic Services operational officer Devices: Elevated toilet seat;Grab bars Toilet Transfers: 3-From toilet/BSC: Mod A (lift or lower assist);3-To toilet/BSC: Mod A (lift or lower assist)  FIM - Banker Devices: Bed  rails Bed/Chair Transfer: 3: Chair or W/C > Bed: Mod A (lift or lower assist);3: Bed > Chair or W/C: Mod A (lift or lower  assist)  FIM - Locomotion: Wheelchair Distance: 50 Locomotion: Wheelchair: 2: Travels 50 - 149 ft with supervision, cueing or coaxing FIM - Locomotion: Ambulation Locomotion: Ambulation Assistive Devices: Designer, industrial/product Ambulation/Gait Assistance: 4: Min assist Locomotion: Ambulation: 1: Travels less than 50 ft with minimal assistance (Pt.>75%)  Comprehension Comprehension Mode: Auditory Comprehension: 3-Understands basic 50 - 74% of the time/requires cueing 25 - 50%  of the time  Expression Expression Mode: Verbal Expression: 3-Expresses basic 50 - 74% of the time/requires cueing 25 - 50% of the time. Needs to repeat parts of sentences.  Social Interaction Social Interaction: 2-Interacts appropriately 25 - 49% of time - Needs frequent redirection.  Problem Solving Problem Solving: 2-Solves basic 25 - 49% of the time - needs direction more than half the time to initiate, plan or complete simple activities  Memory Memory: 3-Recognizes or recalls 50 - 74% of the time/requires cueing 25 - 49% of the time 1. DVT Prophylaxis/Anticoagulation: Mechanical: Sequential compression devices, below knee Bilateral lower extremities  2. Pain Management: prn tylenol for now to avoid neuro/sedating SE. History of delirium post back surgery. Likely chronic dementia given dx. 3. Mood: monitor for now. Off paxil to avoid SE as on Zyvox. LCSW to follow up for evaluation.  4. Parkinson's disease:  mirapex, sinemet and amantadine.   -Neuro follow up appreciated. i agree that any med ajustments will yield suboptimal results given his hx. He will likely tend to wax and wane as well.  Neuro to review Dr. Imagene Gurney records. In the meantime, continue to address other issues including volume statues, po intake, ID, etc  -UA negative to equivocal, ucx still pending  -Head CT negative from  Saturday.   - Continue midodrine for orthostasis.--did quite well while up yesterday  -will have to tolerate resting hypertension to a certain extent. 5. Staph infection: Zyvox for 6 weeks  6. Constipation: senokot s and miralax daily.  7. ?COPD/reactive airway disease: Will continue advair bid as at home. 8. Renal insufficiency- pressure supports  -IVF restarted. Follow up labs as available 9. Cough- mucinex bid- seems improved  ?residual from intubation. Chest is clear, upper airway, mild dysphagia  -parkinson component   LOS (Days) 11 A FACE TO FACE EVALUATION WAS PERFORMED  Ebany Bowermaster E 08/08/2011, 8:54 AM

## 2011-08-09 NOTE — Progress Notes (Signed)
Patient ID: [name], male   DOB: [birth year], 78 y.o.   MRN: [id number] Subjective/Complaints: Alert today. Up with therapies. Athetosis noted in both arms.  Mild L [place]oulder pain but good rom Other ROS reviewed, + cough, - SOB  Objective: Vital Signs: Blood pressure 155/95, pulse 92, temperature 97.8 F (36.6 C), temperature source Oral, resp. rate 18, height 6\' 1"  (1.854 m), weight 82.5 kg (181 lb 14.1 oz), SpO2 95.00%. No results found. No results found for this basename: WBC:2,HGB:2,HCT:2,PLT:2 in the last 72 hours  Basename 08/08/11 0631  NA 134*  K 4.3  CL 102  CO2 20  GLUCOSE 88  BUN 35*  CREATININE 1.54*  CALCIUM 9.3   CBG (last 3)  No results found for this basename: GLUCAP:3 in the last 72 hours  Wt Readings from Last 3 Encounters:  08/06/11 82.5 kg (181 lb 14.1 oz)  07/24/11 94.802 kg (209 lb)  07/24/11 94.802 kg (209 lb)    Physical Exam:  General appearance: alert, cooperative and no distress Head: Normocephalic, without obvious abnormality, atraumatic Eyes:  Ears:  Nose: Nares normal. Septum midline. Mucosa normal. No drainage or sinus tenderness. Throat: lips, mucosa, and tongue normal; teeth and gums normal Neck: no adenopathy, no carotid bruit, no JVD, supple, symmetrical, trachea midline and thyroid not enlarged, symmetric, no tenderness/mass/nodules Back: symmetric, no curvature. ROM normal. No CVA tenderness. Resp: clear to auscultation bilaterally Cardio: regular rate and rhythm, S1, S2 normal, no murmur, click, rub or gallop GI: soft, non-tender; bowel sounds normal; no masses,  no organomegaly Extremities: extremities normal, atraumatic, no cyanosis or edema Pulses: 2+ and symmetric Skin: Skin color, texture, turgor normal. No rashes or lesions Neurologic: dysarthric speech, masked facies,forward posture. Intentional tremors and dyskinesias noted especially in UE's.  Incision/Wound: wounds clean and intact with staples   Assessment/Plan: 1.  Functional deficits secondary to Parkinson's Disease s/p removal of deep brain stimulator due to infection which require 3+ hours per day of interdisciplinary therapy in a comprehensive inpatient rehab setting. Physiatrist is providing close team supervision and 24 hour management of active medical problems listed below. Physiatrist and rehab team continue to assess barriers to discharge/monitor patient progress toward functional and medical goals.  DC plan is SNF   FIM: FIM - Bathing Bathing Steps Patient Completed: Chest;Right Arm;Left Arm;Abdomen;Front perineal area;Right upper leg;Left upper leg Bathing: 3: Mod-Patient completes 5-7 51f 10 parts or 50-74%  FIM - Upper Body Dressing/Undressing Upper body dressing/undressing steps patient completed: Put head through opening of pull over shirt/dress;Pull shirt over trunk Upper body dressing/undressing: 3: Mod-Patient completed 50-74% of tasks FIM - Lower Body Dressing/Undressing Lower body dressing/undressing steps patient completed: Thread/unthread left pants leg;Thread/unthread right pants leg;Pull pants up/down;Fasten/unfasten pants;Don/Doff left shoe;Fasten/unfasten right shoe Lower body dressing/undressing: 1: Total-Patient completed less than 25% of tasks  FIM - Toileting Toileting steps completed by patient: Adjust clothing prior to toileting Toileting Assistive Devices: Grab bar or rail for support Toileting: 1: Total-Patient completed zero steps, helper did all 3  FIM - Diplomatic Services operational officer Devices: Elevated toilet seat;Grab bars Toilet Transfers: 0-Activity did not occur  FIM - Banker Devices: Environmental consultant;Arm rests Bed/Chair Transfer: 3: Bed > Chair or W/C: Mod A (lift or lower assist);3: Supine > Sit: Mod A (lifting assist/Pt. 50-74%/lift 2 legs;3: Sit > Supine: Mod A (lifting assist/Pt. 50-74%/lift 2 legs);3: Chair or W/C > Bed: Mod A (lift or lower assist)  FIM -  Locomotion: Wheelchair Distance: 50 Locomotion: Wheelchair: 1:  Total Assistance/staff pushes wheelchair (Pt<25%) FIM - Locomotion: Ambulation Locomotion: Ambulation Assistive Devices: Walker - Rolling Ambulation/Gait Assistance: 4: Min assist Locomotion: Ambulation: 4: Travels 150 ft or more with minimal assistance (Pt.>75%) (assist for proximity to walker and cues for foot clearance )  Comprehension Comprehension Mode: Auditory Comprehension: 3-Understands basic 50 - 74% of the time/requires cueing 25 - 50%  of the time  Expression Expression Mode: Verbal Expression: 3-Expresses basic 50 - 74% of the time/requires cueing 25 - 50% of the time. Needs to repeat parts of sentences.  Social Interaction Social Interaction: 3-Interacts appropriately 50 - 74% of the time - May be physically or verbally inappropriate.  Problem Solving Problem Solving: 2-Solves basic 25 - 49% of the time - needs direction more than half the time to initiate, plan or complete simple activities  Memory Memory: 4-Recognizes or recalls 75 - 89% of the time/requires cueing 10 - 24% of the time 1. DVT Prophylaxis/Anticoagulation: Mechanical: Sequential compression devices, below knee Bilateral lower extremities  2. Pain Management: prn tylenol for now to avoid neuro/sedating SE. History of delirium post back surgery. Likely chronic dementia given dx. 3. Mood: monitor for now. Off paxil to avoid SE as on Zyvox. LCSW to follow up for evaluation.  4. Parkinson's disease:  Lower dose of mirapex,cont current sinemet and amantadine.   -Neuro follow up appreciated. i agree that any med ajustments will yield suboptimal results given his hx. He will likely tend to wax and wane as well.  Neuro to review Dr. Imagene Gurney records. In the meantime, continue to address other issues including volume statues, po intake, ID, etc  -UA negative to equivocal, ucx still pending  -Head CT negative from Saturday.   - Continue midodrine for  orthostasis.--did quite well while up yesterday  -will have to tolerate resting hypertension to a certain extent. 5. Staph infection: Zyvox for 6 weeks  6. Constipation: senokot s and miralax daily.  7. ?COPD/reactive airway disease: Will continue advair bid as at home. 8. Renal insufficiency- pressure supports  -IVF restarted. Follow up labs as available 9. Cough- mucinex bid- seems improved  ?residual from intubation. Chest is clear, upper airway, mild dysphagia  -parkinson component   LOS (Days) 12 A FACE TO FACE EVALUATION WAS PERFORMED  Manpreet Kemmer E 08/09/2011, 8:12 AM

## 2011-08-09 NOTE — Progress Notes (Signed)
Physical Therapy Session Note  Patient Details  Name: [name] MRN: [id number] Date of Birth: 1929/05/28  Today's Date: 08/09/2011 Time: 5621-3086 Time Calculation (min): 35 min  Skilled Therapeutic Interventions/Progress Updates:  Seated upper trunk flexion for stretching and prep for transfers and hip adduction squeezes for strengthening (10x AAROM) Multiple sit<>stands from w/c working on functional movement patterns, ModA. Standing at the RW working on weight shifting and upper trunk rotation, Min A. Ambulation with RW 103ftx2, MinA cues for safe technique.  Therapy Documentation Precautions:  Precautions Precautions: Fall Precaution Comments: TED hose when OOB Required Braces or Orthoses: Other Brace/Splint Other Brace/Splint: R orthopedic shoe d/t forefoot amputation Restrictions Weight Bearing Restrictions: No Vital Signs: Oxygen Therapy SpO2: 94 % O2 Device: None (Room air) Pain: Pain Assessment Pain Assessment: 2/10 pain in neck, denies needing pain meds,  See FIM for current functional status  Therapy/Group: Individual Therapy   Hortencia Conradi, PTA 08/09/2011, 1:21 PM

## 2011-08-10 NOTE — Progress Notes (Signed)
Patient ID: [name], male   DOB: 19-Aug-1929, 78 y.o.   MRN: [id number] Subjective/Complaints: Alert today. Up with therapies. Athetosis improved.  Other ROS reviewed, + cough, - SOB  Objective: Vital Signs: Blood pressure 138/81, pulse 77, temperature 97.6 F (36.4 C), temperature source Oral, resp. rate 20, height 6\' 1"  (1.854 m), weight 82.5 kg (181 lb 14.1 oz), SpO2 99.00%. No results found. No results found for this basename: WBC:2,HGB:2,HCT:2,PLT:2 in the last 72 hours  Basename 08/08/11 0631  NA 134*  K 4.3  CL 102  CO2 20  GLUCOSE 88  BUN 35*  CREATININE 1.54*  CALCIUM 9.3   CBG (last 3)  No results found for this basename: GLUCAP:3 in the last 72 hours  Wt Readings from Last 3 Encounters:  08/06/11 82.5 kg (181 lb 14.1 oz)  07/24/11 94.802 kg (209 lb)  07/24/11 94.802 kg (209 lb)    Physical Exam:  General appearance: alert, cooperative and no distress Head: Normocephalic, without obvious abnormality, atraumatic Eyes:  Ears:  Nose: Nares normal. Septum midline. Mucosa normal. No drainage or sinus tenderness. Throat: lips, mucosa, and tongue normal; teeth and gums normal Neck: no adenopathy, no carotid bruit, no JVD, supple, symmetrical, trachea midline and thyroid not enlarged, symmetric, no tenderness/mass/nodules Back: symmetric, no curvature. ROM normal. No CVA tenderness. Resp: clear to auscultation bilaterally Cardio: regular rate and rhythm, S1, S2 normal, no murmur, click, rub or gallop GI: soft, non-tender; bowel sounds normal; no masses,  no organomegaly Extremities: extremities normal, atraumatic, no cyanosis or edema Pulses: 2+ and symmetric Skin: Skin color, texture, turgor normal. No rashes or lesions Neurologic: dysarthric speech, masked facies,forward posture. Intentional tremors and dyskinesias noted especially in UE's.  Incision/Wound: wounds clean and intact with staples   Assessment/Plan: 1. Functional deficits secondary to Parkinson's  Disease s/p removal of deep brain stimulator due to infection which require 3+ hours per day of interdisciplinary therapy in a comprehensive inpatient rehab setting. Physiatrist is providing close team supervision and 24 hour management of active medical problems listed below. Physiatrist and rehab team continue to assess barriers to discharge/monitor patient progress toward functional and medical goals.  DC plan is SNF   FIM: FIM - Bathing Bathing Steps Patient Completed: Chest;Right Arm;Left Arm;Abdomen;Front perineal area;Right upper leg;Left upper leg Bathing: 3: Mod-Patient completes 5-7 75f 10 parts or 50-74%  FIM - Upper Body Dressing/Undressing Upper body dressing/undressing steps patient completed: Put head through opening of pull over shirt/dress;Pull shirt over trunk Upper body dressing/undressing: 3: Mod-Patient completed 50-74% of tasks FIM - Lower Body Dressing/Undressing Lower body dressing/undressing steps patient completed: Thread/unthread left pants leg;Thread/unthread right pants leg;Pull pants up/down;Fasten/unfasten pants;Don/Doff left shoe;Fasten/unfasten right shoe Lower body dressing/undressing: 1: Total-Patient completed less than 25% of tasks  FIM - Toileting Toileting steps completed by patient: Adjust clothing prior to toileting Toileting Assistive Devices: Grab bar or rail for support Toileting: 1: Two helpers  FIM - Diplomatic Services operational officer Devices: Elevated toilet seat;Grab bars Toilet Transfers: 2-To toilet/BSC: Lance A (lift and lower assist);2-From toilet/BSC: Lance A (lift and lower assist)  FIM - Press photographer Assistive Devices: Arm rests;Walker Bed/Chair Transfer: 3: Bed > Chair or W/C: Mod A (lift or lower assist);3: Chair or W/C > Bed: Mod A (lift or lower assist)  FIM - Locomotion: Wheelchair Distance: 50 Locomotion: Wheelchair: 0: Activity did not occur FIM - Locomotion: Ambulation Locomotion:  Ambulation Assistive Devices: Designer, industrial/product Ambulation/Gait Assistance: 4: Min assist Locomotion: Ambulation: 1: Travels less  than 50 ft with minimal assistance (Pt.>75%)  Comprehension Comprehension Mode: Auditory Comprehension: 3-Understands basic 50 - 74% of the time/requires cueing 25 - 50%  of the time  Expression Expression Mode: Verbal Expression: 3-Expresses basic 50 - 74% of the time/requires cueing 25 - 50% of the time. Needs to repeat parts of sentences.  Social Interaction Social Interaction: 3-Interacts appropriately 50 - 74% of the time - May be physically or verbally inappropriate.  Problem Solving Problem Solving: 2-Solves basic 25 - 49% of the time - needs direction more than half the time to initiate, plan or complete simple activities  Memory Memory: 4-Recognizes or recalls 75 - 89% of the time/requires cueing 10 - 24% of the time 1. DVT Prophylaxis/Anticoagulation: Mechanical: Sequential compression devices, below knee Bilateral lower extremities  2. Pain Management: prn tylenol for now to avoid neuro/sedating SE. History of delirium post back surgery. Likely chronic dementia given dx. 3. Mood: monitor for now. Off paxil to avoid SE as on Zyvox. LCSW to follow up for evaluation.  4. Parkinson's disease:  Lower dose of mirapex,cont current sinemet and amantadine.   -Neuro follow up appreciated. i agree that any med ajustments will yield suboptimal results given his hx. He will likely tend to wax and wane as well.  Neuro to review Dr. Imagene Gurney records. In the meantime, continue to address other issues including volume statues, po intake, ID, etc  -UA negative to equivocal, ucx + GNR add keflex pending cx result  -Head CT negative from Saturday.   - Continue midodrine for orthostasis.--did quite well while up yesterday  -will have to tolerate resting hypertension to a certain extent. 5. Staph infection: Zyvox for 6 weeks  6. Constipation: senokot s and miralax daily.    7. ?COPD/reactive airway disease: Will continue advair bid as at home. 8. Renal insufficiency- pressure supports  -IVF restarted. Follow up labs as available 9. Cough- mucinex bid- seems improved  ?residual from intubation. Chest is clear, upper airway, mild dysphagia  -parkinson component 10.  Code status discussion- pt desires no extraordinary measures I specifically discussed CPR,cardioversion, intubation- make DNR  LOS (Days) 13 A FACE TO FACE EVALUATION WAS PERFORMED  Jarely Juncaj E 08/10/2011, 7:29 AM

## 2011-08-11 LAB — URINE CULTURE
Colony Count: >=100000
Culture  Setup Time: 201305290400

## 2011-08-11 MED ORDER — SODIUM CHLORIDE 0.45 % IV SOLN
INTRAVENOUS | Status: DC
  Administered 2011-08-11: 18:00:00 via INTRAVENOUS

## 2011-08-11 MED ORDER — CIPROFLOXACIN HCL 250 MG PO TABS
250.0000 mg | ORAL_TABLET | Freq: Two times a day (BID) | ORAL | Status: DC
  Administered 2011-08-11 – 2011-08-14 (×7): 250 mg via ORAL
  Filled 2011-08-11 (×9): qty 1

## 2011-08-11 MED ORDER — DICLOFENAC SODIUM 1 % TD GEL
1.0000 "application " | Freq: Four times a day (QID) | TRANSDERMAL | Status: DC
  Administered 2011-08-11 – 2011-08-14 (×12): 1 via TOPICAL
  Filled 2011-08-11 (×2): qty 100

## 2011-08-11 NOTE — Progress Notes (Signed)
Physical Therapy Note  Patient Details  Name: [name] MRN: [id number] Date of Birth: 01-Jul-1929 Today's Date: 08/11/2011  Time: 900-956 56 minutes  No c/o pain.  Pt with decreased dyskinesias, improved vocal quality and overall activity tolerance.  NMR to increase hip and trunk flexion.  Repetitive sit to stands with focus on anterior translation and wt shifting over toes.  Standing trunk rotation with reaching tasks B.  Pt with improved balance, continues to require min A to decrease retropulsion in standing.  Gait training fwd and bkwd gait with close supervision for fwd gait in controlled environment, min-mod A for backing up due to increased retropulsion.  Obstacle negotiation with gait with min A for RW safety, pt noted to have some impulsive behaviors, requiring cues for RW control around obstacles.  Stair training to increase anterior wt shifts with min A for 3 stairs.   Pt with overall improved mobility and activity tolerance today, requiring less assistance for all tasks.  Individual therapy   Dio Giller 08/11/2011, 3:57 PM

## 2011-08-11 NOTE — Progress Notes (Signed)
Patient ID: [name], male   DOB: 23-Oct-1929, 78 y.o.   MRN: [id number] Patient ID: [name], male   DOB: 05/27/1929, 78 y.o.   MRN: [id number] Subjective/Complaints: Alert today. Good morning with therapies.  Other ROS reviewed, + cough, - SOB  Objective: Vital Signs: Blood pressure 155/94, pulse 74, temperature 97.5 F (36.4 C), temperature source Oral, resp. rate 18, height 6\' 1"  (1.854 m), weight 82.5 kg (181 lb 14.1 oz), SpO2 90.00%. No results found. No results found for this basename: WBC:2,HGB:2,HCT:2,PLT:2 in the last 72 hours No results found for this basename: NA:2,K:2,CL:2,CO2:2,GLUCOSE:2,BUN:2,CREATININE:2,CALCIUM:2 in the last 72 hours CBG (last 3)  No results found for this basename: GLUCAP:3 in the last 72 hours  Wt Readings from Last 3 Encounters:  08/06/11 82.5 kg (181 lb 14.1 oz)  07/24/11 94.802 kg (209 lb)  07/24/11 94.802 kg (209 lb)    Physical Exam:  General appearance: alert, cooperative and no distress Head: Normocephalic, without obvious abnormality, atraumatic Eyes:  Ears:  Nose: Nares normal. Septum midline. Mucosa normal. No drainage or sinus tenderness. Throat: lips, mucosa, and tongue normal; teeth and gums normal Neck: no adenopathy, no carotid bruit, no JVD, supple, symmetrical, trachea midline and thyroid not enlarged, symmetric, no tenderness/mass/nodules Back: symmetric, no curvature. ROM normal. No CVA tenderness. Resp: clear to auscultation bilaterally Cardio: regular rate and rhythm, S1, S2 normal, no murmur, click, rub or gallop GI: soft, non-tender; bowel sounds normal; no masses,  no organomegaly Extremities: extremities normal, atraumatic, no cyanosis or edema Pulses: 2+ and symmetric Skin: Skin color, texture, turgor normal. No rashes or lesions Neurologic: dysarthric speech, masked facies,forward posture. Intentional tremors and dyskinesias noted especially in UE's.  Incision/Wound: wounds clean and intact with  staples   Assessment/Plan: 1. Functional deficits secondary to Parkinson's Disease s/p removal of deep brain stimulator due to infection which require 3+ hours per day of interdisciplinary therapy in a comprehensive inpatient rehab setting. Physiatrist is providing close team supervision and 24 hour management of active medical problems listed below. Physiatrist and rehab team continue to assess barriers to discharge/monitor patient progress toward functional and medical goals.  DC plan home now with recent neuro improvement?   FIM: FIM - Bathing Bathing Steps Patient Completed: Chest;Right Arm;Left Arm;Abdomen;Front perineal area Bathing: 3: Mod-Patient completes 5-7 42f 10 parts or 50-74%  FIM - Upper Body Dressing/Undressing Upper body dressing/undressing steps patient completed: Put head through opening of pull over shirt/dress;Pull shirt over trunk Upper body dressing/undressing: 3: Mod-Patient completed 50-74% of tasks FIM - Lower Body Dressing/Undressing Lower body dressing/undressing steps patient completed: Thread/unthread left pants leg;Thread/unthread right pants leg;Pull pants up/down;Fasten/unfasten pants;Don/Doff left shoe;Fasten/unfasten right shoe Lower body dressing/undressing: 1: Two helpers  FIM - Toileting Toileting steps completed by patient: Adjust clothing prior to toileting Toileting Assistive Devices: Grab bar or rail for support Toileting: 1: Two helpers  FIM - Diplomatic Services operational officer Devices: Elevated toilet seat;Grab bars Toilet Transfers: 3-From toilet/BSC: Mod A (lift or lower assist)  FIM - Banker Devices: Arm rests;Walker Bed/Chair Transfer: 3: Bed > Chair or W/C: Mod A (lift or lower assist);3: Chair or W/C > Bed: Mod A (lift or lower assist)  FIM - Locomotion: Wheelchair Distance: 50 Locomotion: Wheelchair: 0: Activity did not occur FIM - Locomotion: Ambulation Locomotion: Ambulation  Assistive Devices: Designer, industrial/product Ambulation/Gait Assistance: 4: Min assist Locomotion: Ambulation: 1: Travels less than 50 ft with minimal assistance (Pt.>75%)  Comprehension Comprehension Mode: Auditory Comprehension: 3-Understands basic  50 - 74% of the time/requires cueing 25 - 50%  of the time  Expression Expression Mode: Verbal Expression: 3-Expresses basic 50 - 74% of the time/requires cueing 25 - 50% of the time. Needs to repeat parts of sentences.  Social Interaction Social Interaction: 3-Interacts appropriately 50 - 74% of the time - May be physically or verbally inappropriate.  Problem Solving Problem Solving: 2-Solves basic 25 - 49% of the time - needs direction more than half the time to initiate, plan or complete simple activities  Memory Memory: 4-Recognizes or recalls 75 - 89% of the time/requires cueing 10 - 24% of the time 1. DVT Prophylaxis/Anticoagulation: Mechanical: Sequential compression devices, below knee Bilateral lower extremities  2. Pain Management: prn tylenol for now to avoid neuro/sedating SE. History of delirium post back surgery. Likely chronic dementia given dx. 3. Mood: monitor for now. Off paxil to avoid SE as on Zyvox. LCSW to follow up for evaluation.  4. Parkinson's disease:  Lower dose of mirapex has helped,cont current sinemet and amantadine.   -Neuro follow up appreciated. i agree that any med ajustments will yield suboptimal results given his hx. He will likely tend to wax and wane as well.  Neuro to review Dr. Imagene Gurney records. In the meantime, continue to address other issues including volume statues, po intake, ID, etc  - ucx + GNR add keflex- need to follow up with lab re culture results.  -Head CT negative from Saturday.   - Continue midodrine for orthostasis.--did quite well while up yesterday  -will have to tolerate resting hypertension to a certain extent. 5. Staph infection: Zyvox for 6 weeks  6. Constipation: senokot s and miralax  daily.  7. ?COPD/reactive airway disease: Will continue advair bid as at home. 8. Renal insufficiency- pressure supports  -IVF restarted. Follow up labs as available 9. Cough- mucinex bid- seems improved--can be dc'ed  -parkinson component 10.  Code status discussion- pt desires no extraordinary measures I specifically discussed CPR,cardioversion, intubation- make DNR  LOS (Days) 14 A FACE TO FACE EVALUATION WAS PERFORMED  Cledith Abdou T 08/11/2011, 9:35 AM

## 2011-08-11 NOTE — Progress Notes (Signed)
Occupational Therapy Session Note  Patient Details  Name: [name] MRN: [id number] Date of Birth: [birth year]  Today's Date: 08/11/2011 Time: 1030-1130 Time Calculation (min): 60 min  Short Term Goals: Week 2:  OT Short Term Goal 1 (Week 2): Pt will perform all bathing with min assist except for sit to stand, mod assist. OT Short Term Goal 2 (Week 2): Pt will perform toilet transfer 3/3 attempts with no more than mod facilitation. OT Short Term Goal 3 (Week 2): Pt will perform UB dressing with min assist to donn pullover shirt and mod assist to donn pants. OT Short Term Goal 4 (Week 2): Pt will perform 3 grooming tasks with no more than min assist while sitting at the sink.  Skilled Therapeutic Interventions/Progress Updates:    1:1 self care retraining at shower level. Focus on functional ambulation with safe RW use to access bathroom. Sat on elevated 3:1 to shower. Remained seated for shower to maintain BP (when not wearing TED hose), buttocks cleaned from seated position. Sit to stands with attention to forward weight shift with anterior pelvic tilt with min to mod A. Pt with increased UE tremors/ movement at end of session with grooming tasks secondary to fatigue. Wife present at end of session.  Therapy Documentation Precautions:  Precautions Precautions: Fall Precaution Comments: TED hose when OOB Required Braces or Orthoses: Other Brace/Splint Other Brace/Splint: R orthopedic shoe d/t forefoot amputation Restrictions Weight Bearing Restrictions: No Pain: Pain Assessment Pain Assessment: 0-10 Pain Type: Chronic pain Pain Location: Shoulder Pain Orientation: Left Pain Descriptors: Aching;Sore RN made aware  See FIM for current functional status  Therapy/Group: Individual Therapy  Roney Mans [facility] 08/11/2011, 11:44 AM

## 2011-08-12 LAB — CBC
MCH: 30.2 pg (ref 26.0–34.0)
MCHC: 33 g/dL (ref 30.0–36.0)
MCV: 91.5 fL (ref 78.0–100.0)
Platelets: 173 10*3/uL (ref 150–400)
RDW: 14.3 % (ref 11.5–15.5)

## 2011-08-12 LAB — BASIC METABOLIC PANEL
CO2: 23 mEq/L (ref 19–32)
Calcium: 9.6 mg/dL (ref 8.4–10.5)
Creatinine, Ser: 1.35 mg/dL (ref 0.50–1.35)
GFR calc Af Amer: 56 mL/min — ABNORMAL LOW (ref >90)
GFR calc non Af Amer: 48 mL/min — ABNORMAL LOW (ref >90)

## 2011-08-12 MED ORDER — SODIUM CHLORIDE 0.45 % IV SOLN
Freq: Every day | INTRAVENOUS | Status: AC
  Administered 2011-08-12: 21:00:00 via INTRAVENOUS

## 2011-08-12 NOTE — Progress Notes (Signed)
Physical Therapy Note  Patient Details  Name: [name] MRN: [id number] Date of Birth: 09/14/1929 Today's Date: 08/12/2011  Time: 1300-1330 30 minutes  No c/o pain.  W/c mobility for UE strength and endurance 2 x 100' with cues to increase UE ROM to increase speed and efficiency.  Nu step for reciprocal movements, AAROM B UE and LE x 6 minutes level 4.  Gait training 2 x 100' with fwd and bkwd walking, min A for bkwd, close supervision fwd in controlled environment.  Pt continues to demo improved activity tolerance.  Individual therapy   [name] 08/12/2011, 2:07 PM

## 2011-08-12 NOTE — Progress Notes (Signed)
Occupational Therapy Session Note  Patient Details  Name: [name] MRN: [id number] Date of Birth: 1929/09/12  Today's Date: 08/12/2011 Time: 0865-7846 Time Calculation (min): 49 min  Skilled Therapeutic Interventions/Progress Updates:    Worked on sit to stand transitions using therapeutic activity.  Max instructional cues for scooting forward and positioning feet up under him prior to standing.  In standing demonstrates posterior LOB statically and after performing reaching activity.  Overall needs mod facilitation to regain his balance as well as mod assist to actually perform the sit to stand.  Performed 7-8 intervals of sit to stand during the therapeutic activity.    Therapy Documentation Precautions:  Precautions Precautions: Fall Precaution Comments: TED hose when OOB Required Braces or Orthoses: Other Brace/Splint Other Brace/Splint: R orthopedic shoe d/t forefoot amputation Restrictions Weight Bearing Restrictions: No  Pain: Pain Assessment Pain Assessment: 0-10 Pain Score:   3 Pain Type: Chronic pain Pain Location: Shoulder Pain Orientation: Left Pain Intervention(s): Repositioned;Heat applied ADL: See FIM for current functional status  Therapy/Group: Individual Therapy  Alessandra Sawdey 08/12/2011, 2:44 PM

## 2011-08-12 NOTE — Progress Notes (Signed)
Occupational Therapy Session Note  Patient Details  Name: [name] MRN: [id number] Date of Birth: 1930/02/03  Today's Date: 08/12/2011 Time: 1101-1202 Time Calculation (min): 61 min  Short Term Goals: Week 2:  OT Short Term Goal 1 (Week 2): Pt will perform all bathing with min assist except for sit to stand, mod assist. OT Short Term Goal 2 (Week 2): Pt will perform toilet transfer 3/3 attempts with no more than mod facilitation. OT Short Term Goal 3 (Week 2): Pt will perform UB dressing with min assist to donn pullover shirt and mod assist to donn pants. OT Short Term Goal 4 (Week 2): Pt will perform 3 grooming tasks with no more than min assist while sitting at the sink.  Skilled Therapeutic Interventions/Progress Updates:    Bathing at shower level.  Pt still variable with transfers.  Max assist for sit to stand from EOB secondary to decreased forward weight shift and not having his feet far enough under him.  Able to shower with overall mod assist sit to stand using the grab bar.  Utilized Sports administrator for [employer]  Able to donn pants with increased time but needed assist with both shoes.  Min assist for sit to stand from the wheelchair at the sink, with mod instructional cueing for setup and foot placement as well as bending forward.   Therapy Documentation Precautions:  Precautions Precautions: Fall Precaution Comments: TED hose when OOB Required Braces or Orthoses: Other Brace/Splint Other Brace/Splint: R orthopedic shoe d/t forefoot amputation Restrictions Weight Bearing Restrictions: No  Pain: Pain Assessment Pain Assessment: 0-10 Pain Score: 10-Worst pain ever Pain Type: Chronic pain Pain Location: Shoulder Pain Orientation: Left Pain Descriptors: Aching;Tender;Sore Pain Onset: On-going Patients Stated Pain Goal: 2 Pain Intervention(s): Medication (See eMAR);Heat applied Multiple Pain Sites: No ADL: See FIM for current functional status  Therapy/Group:  Individual Therapy  Allisen Pidgeon 08/12/2011, 1:00 PM

## 2011-08-12 NOTE — Progress Notes (Signed)
Physical Therapy Weekly Progress Note  Patient Details  Name: JAAZIAH SCHULKE MRN: [id number] Date of Birth: 14-Dec-1929  Today's Date: 08/12/2011  Patient has met  7 of 8 long term goals.  Pt is currently consistently at a min-mod A level for transfers, supervision-min A for gait, supervision for w/c mobility.  Requires mod A with bed mobility.  Pt's d/c plan is SNF.  Patient continues to demonstrate the following deficits: impaired balance, decreased functional ROM, decreased activity tolerance, impaired gait, decreased strength and therefore will continue to benefit from skilled PT intervention to enhance overall performance with activity tolerance, balance, postural control and coordination.  See Patient's Care Plan for progression toward long term goals.  Patient progressing toward long term goals..  Continue plan of care.  Skilled Therapeutic Interventions/Progress Updates:  Ambulation/gait training;Balance/vestibular training;Cognitive remediation/compensation;Functional mobility training;Patient/family education;Discharge planning;DME/adaptive equipment instruction;Neuromuscular re-education;Pain management;UE/LE Strength taining/ROM;Therapeutic Exercise;UE/LE Coordination activities;Wheelchair propulsion/positioning;Stair training;Therapeutic Activities   See FIM for current functional status   Keric Zehren 08/12/2011, 2:12 PM

## 2011-08-12 NOTE — Progress Notes (Signed)
Physical Therapy Note  Patient Details  Name: [name] MRN: [id number] Date of Birth: May 23, 1929 Today's Date: 08/12/2011  Time: 800-857 57 minutes  No c/o pain.  Pre gait stretching and NMR in supine with focus on core and hip muscle activation, increasing AROM for functional activities.  Pt fatigues easily with NMR for core musculature, requires frequent rests.  Gait training controlled environment with close supervision, obstacle negotiation and home environment require min A, cues for RW control.  Fwd/bkwd walking for balance challenges with min A.  Pt requires cues to slow down and take time with obstacle negotiation in home environment.  Standing balance and postural control with trunk rotation with reaching task.  Pt min A for standing balance with 1 UE support, mod A when reaching with L UE with more fatigue.  W/c mobility home and controlled environments with supervision.  Pt continues with overall improved activity tolerance and decreased dyskinesias.  Individual therapy   [name] 08/12/2011, 9:04 AM

## 2011-08-12 NOTE — Progress Notes (Signed)
Subjective/Complaints: A little restless last night with confusion. No issues on exam this am. Minimal dyskinesias.   Other ROS reviewed, + cough, - SOB  Objective: Vital Signs: Blood pressure 155/102, pulse 85, temperature 97.5 F (36.4 C), temperature source Oral, resp. rate 18, height 6\' 1"  (1.854 m), weight 80.7 kg (177 lb 14.6 oz), SpO2 94.00%. No results found.  Basename 08/12/11 0640  WBC 8.2  HGB 11.0*  HCT 33.3*  PLT 173   No results found for this basename: NA:2,K:2,CL:2,CO2:2,GLUCOSE:2,BUN:2,CREATININE:2,CALCIUM:2 in the last 72 hours CBG (last 3)  No results found for this basename: GLUCAP:3 in the last 72 hours  Wt Readings from Last 3 Encounters:  08/12/11 80.7 kg (177 lb 14.6 oz)  07/24/11 94.802 kg (209 lb)  07/24/11 94.802 kg (209 lb)    Physical Exam:  General appearance: alert, cooperative and no distress Head: Normocephalic, without obvious abnormality, atraumatic Eyes:  Ears:  Nose: Nares normal. Septum midline. Mucosa normal. No drainage or sinus tenderness. Throat: lips, mucosa, and tongue normal; teeth and gums normal Neck: no adenopathy, no carotid bruit, no JVD, supple, symmetrical, trachea midline and thyroid not enlarged, symmetric, no tenderness/mass/nodules Back: symmetric, no curvature. ROM normal. No CVA tenderness. Resp: clear to auscultation bilaterally Cardio: regular rate and rhythm, S1, S2 normal, no murmur, click, rub or gallop GI: soft, non-tender; bowel sounds normal; no masses,  no organomegaly Extremities: extremities normal, atraumatic, no cyanosis or edema Pulses: 2+ and symmetric Skin: Skin color, texture, turgor normal. No rashes or lesions Neurologic: dysarthric speech, masked facies,forward posture. Intentional tremors and dyskinesias noted especially in UE's.  Incision/Wound: wounds clean and intact with staples   Assessment/Plan: 1. Functional deficits secondary to Parkinson's Disease s/p removal of deep brain stimulator  due to infection which require 3+ hours per day of interdisciplinary therapy in a comprehensive inpatient rehab setting. Physiatrist is providing close team supervision and 24 hour management of active medical problems listed below. Physiatrist and rehab team continue to assess barriers to discharge/monitor patient progress toward functional and medical goals.  We can proceed with placement at this point.   FIM: FIM - Bathing Bathing Steps Patient Completed: Chest;Right Arm;Left Arm;Abdomen;Front perineal area;Right upper leg;Left upper leg Bathing: 3: Mod-Patient completes 5-7 17f 10 parts or 50-74%  FIM - Upper Body Dressing/Undressing Upper body dressing/undressing steps patient completed: Put head through opening of pull over shirt/dress;Pull shirt over trunk;Thread/unthread left sleeve of pullover shirt/dress;Thread/unthread right sleeve of pullover shirt/dresss Upper body dressing/undressing: 4: Steadying assist FIM - Lower Body Dressing/Undressing Lower body dressing/undressing steps patient completed: Thread/unthread right pants leg;Don/Doff left shoe;Don/Doff right shoe;Fasten/unfasten right shoe;Fasten/unfasten left shoe Lower body dressing/undressing: 3: Mod-Patient completed 50-74% of tasks  FIM - Toileting Toileting steps completed by patient: Adjust clothing prior to toileting Toileting Assistive Devices: Grab bar or rail for support Toileting: 1: Two helpers  FIM - Diplomatic Services operational officer Devices: Elevated toilet seat;Grab bars Toilet Transfers: 4-To toilet/BSC: Min A (steadying Pt. > 75%);4-From toilet/BSC: Min A (steadying Pt. > 75%)  FIM - Bed/Chair Transfer Bed/Chair Transfer Assistive Devices: Arm rests;Walker Bed/Chair Transfer: 3: Supine > Sit: Mod A (lifting assist/Pt. 50-74%/lift 2 legs  FIM - Locomotion: Wheelchair Distance: 50 Locomotion: Wheelchair: 5: Travels 150 ft or more: maneuvers on rugs and over door sills with supervision, cueing  or coaxing FIM - Locomotion: Ambulation Locomotion: Ambulation Assistive Devices: Designer, industrial/product Ambulation/Gait Assistance: 4: Min assist Locomotion: Ambulation: 2: Travels 50 - 149 ft with minimal assistance (Pt.>75%)  Comprehension Comprehension Mode:  Auditory Comprehension: 3-Understands basic 50 - 74% of the time/requires cueing 25 - 50%  of the time  Expression Expression Mode: Verbal Expression: 3-Expresses basic 50 - 74% of the time/requires cueing 25 - 50% of the time. Needs to repeat parts of sentences.  Social Interaction Social Interaction: 3-Interacts appropriately 50 - 74% of the time - May be physically or verbally inappropriate.  Problem Solving Problem Solving: 2-Solves basic 25 - 49% of the time - needs direction more than half the time to initiate, plan or complete simple activities  Memory Memory: 4-Recognizes or recalls 75 - 89% of the time/requires cueing 10 - 24% of the time 1. DVT Prophylaxis/Anticoagulation: Mechanical: Sequential compression devices, below knee Bilateral lower extremities  2. Pain Management: prn tylenol for now to avoid neuro/sedating SE. History of delirium post back surgery. Likely chronic dementia given dx. 3. Mood: monitor for now. Off paxil to avoid SE as on Zyvox. LCSW to follow up for evaluation.  4. Parkinson's disease:  Lower dose of mirapex has helped,cont current sinemet and amantadine.   -I spoke with Dr. Vickey Huger yesterday and she will be by to see the patient today. In general he has shown nice improvement with mirapex adjustment and rx of other issues  - citrobacter and pseudomonas- day 2 cipro  -Head CT negative from Saturday.   - Continue midodrine for orthostasis.--did quite well while up yesterday  -will have to tolerate resting hypertension to a certain extent. 5. Staph infection: Zyvox for 6 weeks  6. Constipation: senokot s and miralax daily.  7. ?COPD/reactive airway disease: Will continue advair bid as at home. 8.  Renal insufficiency- pressure supports  -IVF qhs- labs pending today 9. Cough- mucinex dc'ed  -parkinson component 10.  Code status discussion- pt desires no extraordinary measures I specifically discussed CPR,cardioversion, intubation- DNR  LOS (Days) 15 A FACE TO FACE EVALUATION WAS PERFORMED  Danene Montijo T 08/12/2011, 7:26 AM

## 2011-08-13 NOTE — Progress Notes (Addendum)
Nutrition Follow-up  Continues to eat 100% of meals. Noted that pt eats quickly and needs to be reminded to slow down. Taking Ensure supplement at evening. Noted new d/c plan for SNF.  Diet Order:  Low Tyramine with protein-redistribution (lower protein intake at breakfast and lunch; higher protein intake at dinner)  Supplement: High-Protein snack at HS; Ensure Complete at HS  Meds: Scheduled Meds:   . sodium chloride   Intravenous Daily  . amantadine  100 mg Oral BID  . aspirin EC  81 mg Oral Daily  . B-complex with vitamin C  1 tablet Oral Daily  . carbidopa-levodopa  1 tablet Oral QID  . carbidopa-levodopa  2 tablet Oral Q24H  . ciprofloxacin  250 mg Oral BID  . diclofenac sodium  1 application Topical QID  . feeding supplement  237 mL Oral Q24H  . Fluticasone-Salmeterol  1 puff Inhalation Q12H  . linezolid  600 mg Oral Q12H  . midodrine  5 mg Oral BID WC  . mupirocin ointment   Nasal BID  . pantoprazole  40 mg Oral Q1200  . polyethylene glycol  17 g Oral Daily  . pramipexole  0.25 mg Oral 5 X Daily  . pyridOXINE  50 mg Oral Daily  . senna-docusate  2 tablet Oral QHS   Continuous Infusions:   . DISCONTD: sodium chloride 75 mL/hr at 08/11/11 1821   PRN Meds:.acetaminophen, albuterol, alum & mag hydroxide-simeth, bisacodyl, diphenhydrAMINE, fluticasone, guaiFENesin-dextromethorphan, HYDROcodone-acetaminophen, ipratropium, menthol-cetylpyridinium, phenol, prochlorperazine, prochlorperazine, prochlorperazine, traZODone  Labs:  CMP     Component Value Date/Time   NA 137 08/12/2011 0640   K 4.4 08/12/2011 0640   CL 102 08/12/2011 0640   CO2 23 08/12/2011 0640   GLUCOSE 91 08/12/2011 0640   BUN 28* 08/12/2011 0640   CREATININE 1.35 08/12/2011 0640   CALCIUM 9.6 08/12/2011 0640   PROT 8.0 08/02/2011 0944   ALBUMIN 3.7 08/02/2011 0944   AST 30 08/02/2011 0944   ALT 5 08/02/2011 0944   ALKPHOS 102 08/02/2011 0944   BILITOT 0.6 08/02/2011 0944   GFRNONAA 48* 08/12/2011 0640   GFRAA 56* 08/12/2011  0640    Intake/Output Summary (Last 24 hours) at 08/13/11 1021 Last data filed at 08/13/11 7829  Gross per 24 hour  Intake    960 ml  Output    700 ml  Net    260 ml  BM on 6/3  Weight Status:  80.7 kg - wt continues to trend down  Estimated needs:  1950 - 2150 kcal, 60 - 70 grams protein  Nutrition Dx:  Food-Medication Interaction r/t combined ingestion of protein with administration of levodopa that may result in undesirable efficacy AEB suspected interaction exacerbating dyskinesias associated with Parkinson's disease. Ongoing.  Goal:  Pt to consume adequate nutrition and adhere to special dietary restrictions. Met  Intervention:  Continue current interventions. Eating well. RD to continue to follow.  Monitor:  PO intake, weights, labs, I/O's  Adair Laundry Pager #:  [phone]

## 2011-08-13 NOTE — Progress Notes (Signed)
Occupational Therapy Discharge Summary  Patient Details  Name: [name] MRN: [id number] Date of Birth: November 25, 1929  Today's Date: 08/13/2011  Patient has met 10 of 10 long term goals due to improved activity tolerance and improved balance.  Patient to discharge at overall min to mod A level for basic mobility and ADL tasks sit to stand (with mod A. Pt will continue to benefit from skilled OT to address posture, balance, sit to stand, recall of method for sit to stands, functional mobility, and activity tolerance to continue to improve performance in ADL task  Reasons goals not met:n/a  Recommendation:  Patient will benefit from ongoing skilled OT services in skilled nursing facility setting to continue to advance functional skills in the area of BADL and Reduce care partner burden.  Equipment: No equipment provided  Reasons for discharge: treatment goals met and discharge from hospital  Patient/family agrees with progress made and goals achieved: Yes  OT Discharge Precautions/Restrictions  Precautions Precautions: Fall    ADL ADL Equipment Provided: Reacher;Other (comment) (W/C & sink level ADL's) Eating: Set up Where Assessed-Eating: Chair Grooming: Setup (3/3 tasks) Where Assessed-Grooming: Sitting at sink;Wheelchair Upper Body Bathing: Supervision/safety Where Assessed-Upper Body Bathing: Sitting at sink Lower Body Bathing: Moderate assistance Where Assessed-Lower Body Bathing: Standing at sink;Sitting at sink;Wheelchair Upper Body Dressing: Supervision/safety Where Assessed-Upper Body Dressing: Sitting at sink Lower Body Dressing: Moderate assistance Where Assessed-Lower Body Dressing: Sitting at sink;Standing at sink Toileting: Moderate assistance Where Assessed-Toileting: Bedside Commode Toilet Transfer: Moderate assistance Toilet Transfer Method: Stand pivot Toilet Transfer Equipment: Bedside commode Tub/Shower Transfer: Maximal assistance Tub/Shower Transfer  Method: Stand pivot Tub/Shower Equipment: Insurance underwriter: Not assessed ADL Comments: Pt with decline since initial eval likely related to Parkinson's.  Needs max instructional cueing and max assist for all dressing tasks and mod assist for bathing.   Vision/Perception  Vision - History Baseline Vision: Wears glasses only for reading Perception Perception: Within Functional Limits Praxis Praxis: Impaired Praxis Impairment Details: Motor planning  Cognition Overall Cognitive Status: Impaired Orientation Level: Disoriented to situation Sensation Sensation Light Touch: Impaired by gross assessment Coordination Gross Motor Movements are Fluid and Coordinated: No Fine Motor Movements are Fluid and Coordinated: No Motor  Motor Motor: Abnormal postural alignment and control Mobility  Bed Mobility Sit to Supine: 4: Min assist Transfers Sit to Stand: 3: Mod assist Stand to Sit: 5: Supervision  Trunk/Postural Assessment    abnormal (at baseline) Balance Static Sitting Balance Static Sitting - Balance Support: Feet supported;Bilateral upper extremity supported Static Sitting - Level of Assistance: 5: Stand by assistance Dynamic Sitting Balance Dynamic Sitting - Level of Assistance: 4: Min assist Extremity/Trunk Assessment RUE Assessment RUE Assessment: Within Functional Limits LUE Assessment LUE Assessment: Within Functional Limits  See FIM for current functional status  Roney Mans [facility] 08/13/2011, 2:16 PM

## 2011-08-13 NOTE — Patient Care Conference (Signed)
Inpatient RehabilitationTeam Conference Note Date: 08/12/2011   Time: 11:00 AM    Patient Name: [name]      Medical Record Number: [id number]  Date of Birth: 08/27/1929 Sex: Male         Room/Bed: 4032/4032-01 Payor Info: Payor: MEDICARE  Plan: MEDICARE PART A AND B  Product Type: *No Product type*     Admitting Diagnosis: Parkinson's Dz removal of Brain stimulator  Admit Date/Time:  07/28/2011  4:43 PM Admission Comments: No comment available   Primary Diagnosis:  <principal problem not specified> Principal Problem: <principal problem not specified>  Patient Active Problem List  Diagnoses Date Noted  . Physical deconditioning 07/31/2011  . Sundowning   . Skin infection, bacterial 06/16/2011  . Dyspnea 02/10/2011  . History of depression 10/02/2010  . ANEMIA 04/29/2010  . FIBRILLATION, ATRIAL 04/29/2010  . UMBILICAL HERNIA 04/29/2010  . VENTRAL HERNIA 04/29/2010  . DEGENERATIVE JOINT DISEASE 04/29/2010  . GASTRIC ULCER, HX OF 04/29/2010  . COLONIC POLYPS, HYPERPLASTIC, HX OF 04/29/2010  . Shortness of breath 02/11/2010  . HYPERTENSION, BENIGN 02/09/2009  . HYPERLIPIDEMIA-MIXED 02/10/2008  . PARKINSON'S DISEASE 02/10/2008  . Coronary Atherosclerosis of Native Coronary Artery 02/10/2008  . VENOUS INSUFFICIENCY 02/10/2008  . GASTROINTESTINAL HEMORRHAGE, HX OF 02/10/2008    Expected Discharge Date: Expected Discharge Date:  (SNF is d/c plan-await bed offer)  Team Members Present: Physician: Dr. Faith Rogue Case Manager Present: Melanee Spry, RN Social Worker Present: Amada Jupiter, LCSW Nurse Present: Carlean Purl, RN PT Present: Reggy Eye, PT OT Present: Roney Mans, Felipa Eth, OT SLP Present: Feliberto Gottron, SLP     Current Status/Progress Goal Weekly Team Focus  Medical   improved dyskinesias. still with baseline neuro deficits, uti and dehydrated  improved control of dyskinesias  see above   Bowel/Bladder   incontinent of bladder wears  depends toileted every 3 hours unsuccessful continent of bowel using bed pan lbm 6-3 mod.soft taking miralax and senna at hs  mod assist   manage bowel and bladder every 3 hours   Swallow/Nutrition/ Hydration             ADL's   setup for UB and mod A for LB with AE, min to mod for stand pivot transfers wtih RW depending on fatigue  supervision to min A      Mobility   mod A  min-mod A  functional transfers, activity tolerance, balance   Communication             Safety/Cognition/ Behavioral Observations            Pain   left shoulder  vicodin 1 po voltaren gel scheduled  less than 2   pain will be managed with prescribed meds    Skin   decrease redness to scrotum and buttocks 1-2-3 skin care products after toileting or incontinence  no new breakdown  continue with plan      *See Interdisciplinary Assessment and Plan and progress notes for long and short-term goals  Barriers to Discharge: see above,    Possible Resolutions to Barriers:  see prior    Discharge Planning/Teaching Needs:  Plan changed to SNF - FL2 out and discussing with area facilities.      Team Discussion: Pt is participating, is motivated--will benefit from continued tx at [facility] bed offers.   Revisions to Treatment Plan: none    Continued Need for Acute Rehabilitation Level of Care: The patient requires daily medical management by a physician with specialized training  in physical medicine and rehabilitation for the following conditions: Daily direction of a multidisciplinary physical rehabilitation program to ensure safe treatment while eliciting the highest outcome that is of practical value to the patient.: Yes Daily medical management of patient stability for increased activity during participation in an intensive rehabilitation regime.: Yes Daily analysis of laboratory values and/or radiology reports with any subsequent need for medication adjustment of medical intervention for : Neurological  problems;Post surgical problems  [name] 08/13/2011, 5:33 PM

## 2011-08-13 NOTE — Progress Notes (Signed)
Occupational Therapy Note  Patient Details  Name: [name] MRN: [id number] Date of Birth: December 06, 1929 Today's Date: 08/13/2011  Pt missed 30 min of pm session secondary to being light headed and requesting to lay down and rest.   Roney Mans [facility] 08/13/2011, 3:15 PM

## 2011-08-13 NOTE — Progress Notes (Signed)
Physical Therapy Note  Patient Details  Name: JAVYN HAVLIN MRN: [id number] Date of Birth: 24-Jul-1929 Today's Date: 08/13/2011  Time: 800-845 45 minutes  No c/o pain.  Pt performed sit to stand x 2 for dressing task. 2nd attempt to stand pt c/o dizziness, BP 170/92.  RN made aware.  TED hose donned.  Pt performed sit to stand x 3 for warm up in gym, then performed gait with min A x 100'.  Pt with episode of freezing during gait, states "I just don't know what to do, what is my role here"  Pt mod A to sit down.  BP 141/91, RN made aware.  Pt assisted to toilet with mod A for transfer with grab bar.  Pt continues to state " I just don't feel right".  RN and MD aware.  Individual therapy   Rollin Kotowski 08/13/2011, 12:24 PM

## 2011-08-13 NOTE — Progress Notes (Signed)
Patient ID: [name], male   DOB: 08-23-1929, 78 y.o.   MRN: [id number] Subjective/Complaints: Alert today. Has cough. More dyskinesias today. Has questions about hernia   Other ROS reviewed, + cough, - SOB  Objective: Vital Signs: Blood pressure 154/108, pulse 86, temperature 97.9 F (36.6 C), temperature source Oral, resp. rate 18, height 6\' 1"  (1.854 m), weight 80.7 kg (177 lb 14.6 oz), SpO2 96.00%. No results found.  Basename 08/12/11 0640  WBC 8.2  HGB 11.0*  HCT 33.3*  PLT 173    Basename 08/12/11 0640  NA 137  K 4.4  CL 102  CO2 23  GLUCOSE 91  BUN 28*  CREATININE 1.35  CALCIUM 9.6   CBG (last 3)  No results found for this basename: GLUCAP:3 in the last 72 hours  Wt Readings from Last 3 Encounters:  08/12/11 80.7 kg (177 lb 14.6 oz)  07/24/11 94.802 kg (209 lb)  07/24/11 94.802 kg (209 lb)    Physical Exam:  General appearance: alert, cooperative and no distress Head: Normocephalic, without obvious abnormality, atraumatic Eyes:  Ears:  Nose: Nares normal. Septum midline. Mucosa normal. No drainage or sinus tenderness. Throat: lips, mucosa, and tongue normal; teeth and gums normal Neck: no adenopathy, no carotid bruit, no JVD, supple, symmetrical, trachea midline and thyroid not enlarged, symmetric, no tenderness/mass/nodules Back: symmetric, no curvature. ROM normal. No CVA tenderness. Resp: clear to auscultation bilaterally- a few rhonchi. No distress Cardio: regular rate and rhythm, S1, S2 normal, no murmur, click, rub or gallop GI: soft, non-tender; bowel sounds normal; no masses,  no organomegaly Extremities: extremities normal, atraumatic, no cyanosis or edema Pulses: 2+ and symmetric Skin: Skin color, texture, turgor normal. No rashes or lesions Neurologic: dysarthric speech, masked facies,forward posture. Intentional tremors and dyskinesias noted especially in UE's. Dyskinesias a little more prominent today.  Incision/Wound: wounds clean and  intact with staples   Assessment/Plan: 1. Functional deficits secondary to Parkinson's Disease s/p removal of deep brain stimulator due to infection which require 3+ hours per day of interdisciplinary therapy in a comprehensive inpatient rehab setting. Physiatrist is providing close team supervision and 24 hour management of active medical problems listed below. Physiatrist and rehab team continue to assess barriers to discharge/monitor patient progress toward functional and medical goals.  We can proceed with placement at this point.    FIM: FIM - Bathing Bathing Steps Patient Completed: Chest;Right Arm;Left Arm;Abdomen;Front perineal area;Right upper leg;Left upper leg Bathing: 3: Mod-Patient completes 5-7 22f 10 parts or 50-74%  FIM - Upper Body Dressing/Undressing Upper body dressing/undressing steps patient completed: Thread/unthread right sleeve of pullover shirt/dresss;Pull shirt over trunk;Put head through opening of pull over shirt/dress;Thread/unthread left sleeve of pullover shirt/dress Upper body dressing/undressing: 5: Supervision: Safety issues/verbal cues FIM - Lower Body Dressing/Undressing Lower body dressing/undressing steps patient completed: Thread/unthread right pants leg;Thread/unthread left pants leg;Pull pants up/down Lower body dressing/undressing: 3: Mod-Patient completed 50-74% of tasks  FIM - Toileting Toileting steps completed by patient: Adjust clothing prior to toileting Toileting Assistive Devices: Grab bar or rail for support Toileting: 1: Two helpers  FIM - Diplomatic Services operational officer Devices: Elevated toilet seat;Grab bars Toilet Transfers: 4-To toilet/BSC: Min A (steadying Pt. > 75%);4-From toilet/BSC: Min A (steadying Pt. > 75%)  FIM - Bed/Chair Transfer Bed/Chair Transfer Assistive Devices: Arm rests;Walker Bed/Chair Transfer: 1: Two helpers  FIM - Locomotion: Wheelchair Distance: 50 Locomotion: Wheelchair: 5: Travels 150 ft  or more: maneuvers on rugs and over door sills with supervision, cueing or  coaxing FIM - Locomotion: Ambulation Locomotion: Ambulation Assistive Devices: Designer, industrial/product Ambulation/Gait Assistance: 4: Min assist Locomotion: Ambulation: 4: Travels 150 ft or more with minimal assistance (Pt.>75%)  Comprehension Comprehension Mode: Auditory Comprehension: 3-Understands basic 50 - 74% of the time/requires cueing 25 - 50%  of the time  Expression Expression Mode: Verbal Expression: 3-Expresses basic 50 - 74% of the time/requires cueing 25 - 50% of the time. Needs to repeat parts of sentences.  Social Interaction Social Interaction: 3-Interacts appropriately 50 - 74% of the time - May be physically or verbally inappropriate.  Problem Solving Problem Solving: 2-Solves basic 25 - 49% of the time - needs direction more than half the time to initiate, plan or complete simple activities  Memory Memory: 4-Recognizes or recalls 75 - 89% of the time/requires cueing 10 - 24% of the time 1. DVT Prophylaxis/Anticoagulation: Mechanical: Sequential compression devices, below knee Bilateral lower extremities  2. Pain Management: prn tylenol for now to avoid neuro/sedating SE. History of delirium post back surgery. Likely chronic dementia given dx. 3. Mood: monitor for now. Off paxil to avoid SE as on Zyvox. LCSW to follow up for evaluation.  4. Parkinson's disease:  Lower dose of mirapex has helped,cont current sinemet and amantadine.   -. In general he has shown nice improvement with mirapex adjustment and rx of other issues- a few more dyskinesias noted today.  - citrobacter and pseudomonas- day 3 cipro  -Head CT negative from Saturday.   - Continue midodrine for orthostasis.--did quite well while up yesterday  -will have to tolerate resting hypertension to a certain extent. 5. Staph infection: Zyvox for 6 weeks  6. Constipation: senokot s and miralax daily.  7. ?COPD/reactive airway disease: Will  continue advair bid as at home. 8. Renal insufficiency- pressure supports  -IVF qhs- labs pending today 9. Cough- mucinex dc'ed  -parkinson component  -needs to be careful with dietary intake. Tends to eat quickly and shovel food in before finishing with swallow. 10.  Code status discussion- pt desires no extraordinary measures I specifically discussed CPR,cardioversion, intubation- DNR  LOS (Days) 16 A FACE TO FACE EVALUATION WAS PERFORMED  Bobbyjo Marulanda T 08/13/2011, 7:50 AM

## 2011-08-13 NOTE — Progress Notes (Signed)
Physical Therapy Note  Patient Details  Name: ZAMARIAN SCARANO MRN: [id number] Date of Birth: 28-Mar-1929 Today's Date: 08/13/2011  Time: 1300-1342 42 minutes  No c/o pain.  Pt performed w/c mobility in home and controlled environments with supervision.  Gait with RW with min A 100', 120' with decreased cadence noted today, required more cues due to episodes of freezing, manual facilitation for wt shifts to increase step length.  5 stairs with B HR with min A, cues for safe foot placement.  Pt c/o shoulder pain with stair climbing, RN made aware.  Bed mobility with supervision, NMR in supine for LTR, bridging, abdominal activation.  Pt required more frequent rest breaks this afternoon.  At end of session pt with more c/o dizziness, "not feeling right", pt asked to lay down.  Pt positioned comfortably in supine, RN made aware.  Individual therapy   Nasif Bos 08/13/2011, 3:45 PM

## 2011-08-13 NOTE — Progress Notes (Signed)
Occupational Therapy Weekly Progress Note  Patient Details  Name: [name] MRN: [id number] Date of Birth: [birth year]  Today's Date: 08/13/2011 Pt missed 60 min this am due to fatigue and requesting to go back to bed. "I dont feel well."  Patient has met 4 of 4 short term goals.  Pt continues to make steady progress with OT, but at times his ability to perform functional tasks depending on fatigue and having "bad days." Pt can bathe and dress UB with setup and with extra time, pt can transfer to raised toilet with RW with mod A to come into standing, toileting with mod A, and LB dressing with reacher with mod A (sit to stand with mod A). Plan is still to d/c to SNF 2ndary to level of A needed and wife unable to provide this at home.  Patient continues to demonstrate the following deficits: muscle weakness and muscle joint tightness, decreased cardiorespiratoy endurance, impaired timing and sequencing, abnormal tone, unbalanced muscle activation, decreased coordination and decreased motor planning, decreased initiation, decreased attention, decreased problem solving, decreased safety awareness, decreased memory and delayed processing and decreased standing balance, decreased postural control and decreased balance strategies and therefore will continue to benefit from skilled OT intervention to enhance overall performance with BADL.  Patient progressing toward long term goals..  Continue plan of care.  OT Short Term Goals Week 2:  OT Short Term Goal 1 (Week 2): Pt will perform all bathing with min assist except for sit to stand, mod assist. OT Short Term Goal 1 - Progress (Week 2): Met OT Short Term Goal 2 (Week 2): Pt will perform toilet transfer 3/3 attempts with no more than mod facilitation. OT Short Term Goal 2 - Progress (Week 2): Met OT Short Term Goal 3 (Week 2): Pt will perform UB dressing with min assist to donn pullover shirt and mod assist to donn pants. OT Short Term Goal 3 -  Progress (Week 2): Met OT Short Term Goal 4 (Week 2): Pt will perform 3 grooming tasks with no more than min assist while sitting at the sink. OT Short Term Goal 4 - Progress (Week 2): Met Week 3:  OT Short Term Goal 1 (Week 3): LTG = STG  Skilled Therapeutic Interventions/Progress Updates:    same  Therapy Documentation Precautions:  Precautions Precautions: Fall Precaution Comments: TED hose when OOB Required Braces or Orthoses: Other Brace/Splint Other Brace/Splint: R orthopedic shoe d/t forefoot amputation Restrictions Weight Bearing Restrictions: No General: General Amount of Missed OT Time (min): 60 Minutes Vital Signs: Therapy Vitals Temp: 97.9 F (36.6 C) Temp src: Oral Pulse Rate: 86  Resp: 18  BP: 154/108 mmHg Patient Position, if appropriate: Lying Oxygen Therapy SpO2: 96 % O2 Device: None (Room air) Pain:  stomach discomfort- RN aware ADL:  ADL Comments: Pt with decline since initial eval likely related to Parkinson's.  Needs max instructional cueing and max assist for all dressing tasks and mod assist for bathing.    See FIM for current functional status  Therapy/Group: Individual Therapy  Roney Mans [facility] 08/13/2011, 9:49 AM

## 2011-08-13 NOTE — Progress Notes (Signed)
Social Work Patient ID: [name], male   DOB: June 21, 1929, 78 y.o.   MRN: [id number]  Met with patient and spoke with wife via phone today to inform them of two SNF bed offers:  [facility] and [facility].  Wife prefers [facility] as pt has been there before.  Pt reluctant to this facility noting he does not feel they were "as aggressive" with their therapies as he would have liked when he was there before.  Explained to him that it is still more therapy than he would receive via HH or OP and that his need for 24/7 assistance is other reason behind needing to go to SNF.  Wife has accepted the bed offer and plans to speak with pt this afternoon when she is up to attempt to explain the necessity further with patient.  Plan to transfer him to [facility] tomorrow via ambulance.  Everitt Wenner

## 2011-08-14 NOTE — Progress Notes (Signed)
Social Work  Discharge Note  The overall goal for the admission was met for:   Discharge location: No - d/c plan changed to SNF due to pt's care needs  Length of Stay: Yes  Discharge activity level: No  Home/community participation: No  Services provided included: MD, RD, PT, OT, SLP, RN, CM, TR, Pharmacy and SW  Financial Services: Medicare and Private Insurance: [employer]  Follow-up services arranged: Other: SNF @ [facility]  Comments (or additional information):  Patient/Family verbalized understanding of follow-up arrangements: Yes  Individual responsible for coordination of the follow-up plan: wife  Confirmed correct DME delivered: NA  Mick Tanguma

## 2011-08-14 NOTE — Progress Notes (Signed)
Physical Therapy Discharge Summary  Patient Details  Name: [name] MRN: [id number] Date of Birth: 08-04-1929  Today's Date: 08/14/2011  Patient has met 6 of 7 long term goals due to improved activity tolerance, improved balance, increased strength and improved coordination.  Patient to discharge at an ambulatory level Min Assist to mod A.   Pt fluctuates between min and mod  A for gait with RW in household and controlled environments.  Pt requires min-mod A with balance and transfers depending on fatigue level.  Reasons goals not met: pt inconsistent depending on fatigue and parkinson's symptoms, assist level varies from min-mod A  Recommendation:  Patient will benefit from ongoing skilled PT services in skilled nursing facility setting to continue to advance safe functional mobility, address ongoing impairments in balance, mobility, gait, activity tolerance, coordination, functional ROM, and minimize fall risk.  Equipment: No equipment provided  Reasons for discharge: treatment goals met and discharge from hospital  Patient/family agrees with progress made and goals achieved: Yes  PT Discharge  Orientation Level: Disoriented to situation Sensation Sensation Light Touch: Impaired by gross assessment Light Touch Impaired Details: Impaired RLE (R foot numb) Coordination Gross Motor Movements are Fluid and Coordinated: No Coordination and Movement Description: dyskinesias Motor  Motor Motor - Discharge Observations: dyskinesias   Trunk/Postural Assessment  Cervical Assessment Cervical Assessment:  (fwd head, overal decreased ROM) Thoracic Assessment Thoracic Assessment:  (decreased ROM, kyphosis) Lumbar Assessment Lumbar Assessment:  (posterior tilt, decreased ROM) Postural Control Postural Control:  (posterior tilt)  Balance Static Sitting Balance Static Sitting - Level of Assistance: 5: Stand by assistance Dynamic Sitting Balance Dynamic Sitting - Level of  Assistance: 5: Stand by assistance Static Standing Balance Static Standing - Level of Assistance: 4: Min assist Extremity Assessment      RLE Assessment RLE Assessment:  (decreased ROM R ankle, grossly 3/5) LLE Assessment LLE Assessment:  (decreased ROM ankle, grossly 3/5)  See FIM for current functional status  [name] 08/14/2011, 7:50 AM

## 2011-08-14 NOTE — Discharge Summary (Signed)
NAMEHARRY, [name] NO.:  [id number]  MEDICAL RECORD NO.:  [id number]  LOCATION:  4032                         FACILITY:  [facility]  PHYSICIAN:  Ranelle Oyster, M.D.DATE OF BIRTH:  1929/11/01  DATE OF ADMISSION:  07/28/2011 DATE OF DISCHARGE:  08/14/2011                              DISCHARGE SUMMARY   DISCHARGE DIAGNOSES: 1. Deconditioning past removal of deep brain stimulator. 2. Parkinson disease. 3. Staph infection. 4. Renal insufficiency. 5. Citrobacter/Pseudomonas urinary tract infection. 6. Chronic intermittent orthostatism.  HISTORY OF PRESENT ILLNESS:  [name] is a 78 year old male with a history of Parkinson disease and recent development behind left ear due to infection of DBS and has been treated with IV antibiotics since March 2013.  He was admitted on Jul 28, 2011, for removal of DBS by Dr. Venetia Maxon.  Postop placed on Zyvox for 6 total weeks per Dr. Zenaida Niece Dam's input.  He was noted to have increase in dyskinesias past DBS removal. Noted to have some orthostatic symptoms that fluctuate.  The patient was evaluated by rehab team and we felt that he would benefit from CIR Program.  PAST MEDICAL HISTORY: 1. Mixed dyslipidemia. 2. Parkinson disease. 3. Hypertension. 4. History of GI bleed. 5. History of anemia. 6. AFib. 7. Positive PPD, internal hemorrhoids. 8. Coronary artery disease. 9. Osteomyelitis. 10.TB at age 68. 11.GERD.  FUNCTIONAL HISTORY:  The patient was independent for mobility with rolling walker and wheelchair around the house.  He has been in the [facility] a few weeks prior to admission due to need for antibiotics.   Required assistance with ADLs.  FUNCTIONAL STATUS:  The patient is min to mod assist for bed mobility, mod assist 60% for transfers, min assist ambulating 120 feet with a rolling walker.  LAB RESULTS:  Recent check of lytes from August 12, 2011 reveals sodium 137, potassium 4.4, chloride 102, CO2 of 23, BUN  28, creatinine 1.35, glucose 91.  CBC done reveals hemoglobin 11.0, hematocrit 33.3, white count 8.2, platelets 173.  Urine culture from Aug 05, 2011 showed greater than 100,000 colonies of Citrobacter koseri and Pseudomonas aeruginosa.  RADIOLOGY REPORT:  CT of head from Aug 02, 2011, revealed status post bilateral deep brain stimulator, removal of tiny amount of blood along previous stimulator courses as expected. No unexpected bleeding, mass effect or unexpected edema, no extra-axial collection.  HOSPITAL COURSE:  [name] was admitted to rehab on Jul 28, 2011, for inpatient therapies to consist of PT and OT at least 3 hours 5 days a week.  Past admission physiatrist, rehab RN, and therapy team have worked together to provide customized collaborative interdisciplinary care.  At time of admission, the patient was noted to be orthostatic requiring TEDs as well as abdominal binder to help maintain blood pressure.  He was also noted to be dehydrated with BUN at 40 and creatinine at 1.73.  He was started on IV fluids for hydration and was encouraged to push p.o. fluids.  He has been unable to maintain adequate hydration with just p.o. intake.  He has been maintained on IV fluids nocturnally.  Most recent check of lytes shows renal status to be  much improved.  Our recommendations are to continue IV fluids at night time to help with hydration and monitor renal status on routine basis.  The patient was noted to have some worsening of his dyskinesias.  Dr. Sandria Manly was contacted for input and recommended decreasing the patient's Mirapex to avoid side effects of dyskinesia.  He also recommended limiting protein in his diet and high-protein meals to be set for dinnertime. He is also on low tyramine diet due to zyvox. Dietitian was contacted to help [place] with  dietary changes.  The patient was also noted to have some worsening  of cognitive status with confusion on weekend of 05/25 and CT of  head Was done to rule out acute changes.  CT of head was negative but pt was noted to have UTI and was started on Cipro for treatment with  improvement in mentation.  His scalp incisions had been monitored  and these are healing well without any signs or symptoms of infection. Therapies had been ongoing and in the last 24 hours, PT has been  noting episodes of freezing during gait.  His dyskinesias, however, greatly improved with most recent decrease of Mirapex on 05/31. overall.  During the patient's stay in rehab, weekly team conferences were held to monitor the patient's progress, set goals, as well as discuss barriers to discharge.  Physical therapy has been working with the patient on mobility, strengthening, as well as balance issues.  The patient continues to have decreased functional range of motion, decreased activity tolerance, impaired gait.  He is currently able to perform wheelchair mobility in home in controlled environment with supervision, able to ambulate with a rolling walker with min assist at 100 plus 200 feet with decreased cadence noted. He is requiring more cues due to episodes of freezing. Manual facilitation needed for weight shifting to increase step length.  He is able to navigate 5 stairs with bilateral hand-held with min assist and cues for safe foot placement.  OT has worked with the patient on self-care tasks as well as overall activity tolerance and balance.  Currently, the patient is at supervision level for upper body bathing, requires mod assist for lower body bathing, supervision for safety for upper body dressing, mod assist for lower body dressing.  He requires max assist for tub and shower transfers.  He requires max instructional cuing and max assist for all dressing tasks and mod assist for bathing.    P.o. intake has been good.  He tends to be impulsive and eats at a fast rate.  He needs cuing to slow down.  Speech Therapy was consulted  and did modified bedside swallow evaluation.  The patient was noted to have mild delay in swallow with wet vocal quality at times.  He was noted to have prolonged oral phase with liquid wash needed to assist oral phase Impairment.  He needs cues and supervision to maintain safe swallow stratergies.  He  Is tolerating finger foods and regular textures, thin liquids without signs or symptoms of aspiration.  However, SLP felt that fatigue may impact the patient's function throughout course of entire meal and recommends soft foods with full supervision at meals. Currently, the patient continues to require significant amount of assistance and wife has  elected on continuing further therapies at SNF. Bed is available at [facility] and the patient is to be discharged to this facility for further progressive PT/OT.  On August 14, 2011, the patient is discharged to [facility] in improved condition.  DISCHARGE MEDICATIONS: 1. Amantadine 100 mg p.o. b.i.d. 2. Coated aspirin 81 mg p.o. per day. 3. Vitamin B complex with vitamin C 1 per day. 4. Cipro 250 mg b.i.d. Through 08/17/11 5. Voltaren gel to right shoulder q.i.d. 6. DuoNeb nebulizers q.i.d. p.r.n. shortness of breath. 7. IV fluids 75 mL an hour from 7 p.m. to 7 a.m. 8. Sinemet 25/50, 1 tab p.o. Q.i.d. At 6am, 8am, noon and 6pm.  PLUS  2 tabs at 3 p.m. daily. 9. ProAmatine 5 mg p.o. at 6 a.m. and 2 noon. 10.Mirapex 0.5 mg 5 times a day at 6am, 8am, noon, 3pm and 6pm. 11.Vitamin B6 50 mg p.o. per day. 12.Senokot-S 2 p.o. at bedtime. 13.Zyvox 600 mg p.o. b.i.d. Through 09/08/11. 14.MiraLAX 17 g in 8 ounces p.o. per day.  DIET:  Low-tyramine diet while on Zyvox. D3 textures, full supervision  and assist with self-feeding.  Meds whole in puree. May crush as needed.  SPECIAL INSTRUCTIONS:  Abdominal binder and thigh-high TEDs when out of bed to avoid orthostasis.  Push p.o. fluids.  Next check of lytes on 08/15/11. Progressive PT, OT to  continue past discharge.  Keep K-pad to right  shoulder t.i.d. and p.r.n.  FOLLOWUP:  The patient to follow up with Dr. Venetia Maxon for postop check in the next few weeks.  Follow up with Dr. Vickey Huger or Dr. Sandria Manly for further  Input on Parkinson's medication adjustment.  Follow up with Dr. Riley Kill as needed. Follow up with primary MD at [facility] for medical issues.     Delle Reining, P.A.   ______________________________ Ranelle Oyster, M.D.    PL/MEDQ  D:  08/13/2011  T:  08/13/2011  Job:  [id number]  cc:   Danae Orleans. Venetia Maxon, M.D. Acey Lav, MD Genene Churn. Kinnick Maus, M.D.

## 2011-08-14 NOTE — Progress Notes (Addendum)
Subjective/Complaints: Alert today. Slept better. Bradykinesias noted yesterday. Now having more + symptoms this am.   Other ROS reviewed, + cough, - SOB  Objective: Vital Signs: Blood pressure 199/91, pulse 97, temperature 97.9 F (36.6 C), temperature source Oral, resp. rate 18, height 6\' 1"  (1.854 m), weight 92.715 kg (204 lb 6.4 oz), SpO2 94.00%. No results found.  Basename 08/12/11 0640  WBC 8.2  HGB 11.0*  HCT 33.3*  PLT 173    Basename 08/12/11 0640  NA 137  K 4.4  CL 102  CO2 23  GLUCOSE 91  BUN 28*  CREATININE 1.35  CALCIUM 9.6   CBG (last 3)  No results found for this basename: GLUCAP:3 in the last 72 hours  Wt Readings from Last 3 Encounters:  08/13/11 92.715 kg (204 lb 6.4 oz)  07/24/11 94.802 kg (209 lb)  07/24/11 94.802 kg (209 lb)    Physical Exam:  General appearance: alert, cooperative and no distress Head: Normocephalic, without obvious abnormality, atraumatic Eyes:  Ears:  Nose: Nares normal. Septum midline. Mucosa normal. No drainage or sinus tenderness. Throat: lips, mucosa, and tongue normal; teeth and gums normal Neck: no adenopathy, no carotid bruit, no JVD, supple, symmetrical, trachea midline and thyroid not enlarged, symmetric, no tenderness/mass/nodules Back: symmetric, no curvature. ROM normal. No CVA tenderness. Resp: clear to auscultation bilaterally- a few rhonchi. No distress Cardio: regular rate and rhythm, S1, S2 normal, no murmur, click, rub or gallop GI: soft, non-tender; bowel sounds normal; no masses,  no organomegaly Extremities: extremities normal, atraumatic, no cyanosis or edema Pulses: 2+ and symmetric Skin: Skin color, texture, turgor normal. No rashes or lesions Neurologic: dysarthric speech, masked facies,forward posture. Intentional tremors and dyskinesias noted especially in UE's. Dyskinesias a little more prominent today.  Incision/Wound: wounds clean and intact with staples   Assessment/Plan: 1. Functional  deficits secondary to Parkinson's Disease s/p removal of deep brain stimulator due to infection which require 3+ hours per day of interdisciplinary therapy in a comprehensive inpatient rehab setting. Physiatrist is providing close team supervision and 24 hour management of active medical problems listed below. Physiatrist and rehab team continue to assess barriers to discharge/monitor patient progress toward functional and medical goals.  For SNF placement today.   FIM: FIM - Bathing Bathing Steps Patient Completed: Chest;Right Arm;Left Arm;Abdomen;Front perineal area;Right upper leg;Left upper leg Bathing: 3: Mod-Patient completes 5-7 50f 10 parts or 50-74%  FIM - Upper Body Dressing/Undressing Upper body dressing/undressing steps patient completed: Thread/unthread right sleeve of pullover shirt/dresss;Pull shirt over trunk;Put head through opening of pull over shirt/dress;Thread/unthread left sleeve of pullover shirt/dress Upper body dressing/undressing: 5: Supervision: Safety issues/verbal cues FIM - Lower Body Dressing/Undressing Lower body dressing/undressing steps patient completed: Thread/unthread right pants leg;Thread/unthread left pants leg;Pull pants up/down Lower body dressing/undressing: 3: Mod-Patient completed 50-74% of tasks  FIM - Toileting Toileting steps completed by patient: Adjust clothing prior to toileting Toileting Assistive Devices: Grab bar or rail for support Toileting: 1: Two helpers  FIM - Diplomatic Services operational officer Devices: Elevated toilet seat;Grab bars Toilet Transfers: 4-To toilet/BSC: Min A (steadying Pt. > 75%);4-From toilet/BSC: Min A (steadying Pt. > 75%)  FIM - Bed/Chair Transfer Bed/Chair Transfer Assistive Devices: Arm rests;Walker Bed/Chair Transfer: 3: Chair or W/C > Bed: Mod A (lift or lower assist);3: Bed > Chair or W/C: Mod A (lift or lower assist)  FIM - Locomotion: Wheelchair Distance: 50 Locomotion: Wheelchair: 5:  Travels 150 ft or more: maneuvers on rugs and over door sills with supervision,  cueing or coaxing FIM - Locomotion: Ambulation Locomotion: Ambulation Assistive Devices: Walker - Rolling Ambulation/Gait Assistance: 4: Min assist Locomotion: Ambulation: 4: Travels 150 ft or more with minimal assistance (Pt.>75%)  Comprehension Comprehension Mode: Auditory Comprehension: 3-Understands basic 50 - 74% of the time/requires cueing 25 - 50%  of the time  Expression Expression Mode: Verbal Expression: 3-Expresses basic 50 - 74% of the time/requires cueing 25 - 50% of the time. Needs to repeat parts of sentences.  Social Interaction Social Interaction: 3-Interacts appropriately 50 - 74% of the time - May be physically or verbally inappropriate.  Problem Solving Problem Solving: 2-Solves basic 25 - 49% of the time - needs direction more than half the time to initiate, plan or complete simple activities  Memory Memory: 4-Recognizes or recalls 75 - 89% of the time/requires cueing 10 - 24% of the time 1. DVT Prophylaxis/Anticoagulation: Mechanical: Sequential compression devices, below knee Bilateral lower extremities  2. Pain Management: prn tylenol for now to avoid neuro/sedating SE. History of delirium post back surgery. Likely chronic dementia given dx. 3. Mood: monitor for now. Off paxil to avoid SE as on Zyvox. Resume when zyvox complete 4. Parkinson's disease:  Lower dose of mirapex has helped,cont current sinemet and amantadine.   -. In general he has shown nice improvement with mirapex adjustment - unfortunately, he has a small margin for error regarding bradykinesias and EPS with mirapex doses  - citrobacter and pseudomonas- cipro for 7 days  - Continue midodrine for orthostasis.--did quite well while up yesterday  -will have to tolerate resting hypertension to a certain extent. 5. Staph infection: Zyvox for 6 weeks  6. Constipation: senokot s and miralax daily.  7. ?COPD/reactive airway  disease: Will continue advair bid as at home. 8. Renal insufficiency- pressure supports  -IVF qhs- continue while at the SNF at least for the short term. 9. Cough- mucinex dc'ed  -parkinson component  -needs to be careful with dietary intake. Tends to eat quickly and shovel food in before finishing with swallow. 10.  Code status discussion- pt desires no extraordinary measures I specifically discussed CPR,cardioversion, intubation- DNR  LOS (Days) 17 A FACE TO FACE EVALUATION WAS PERFORMED  [name] T 08/14/2011, 6:25 AM

## 2011-08-21 ENCOUNTER — Ambulatory Visit (INDEPENDENT_AMBULATORY_CARE_PROVIDER_SITE_OTHER): Payer: Medicare Other | Admitting: Internal Medicine

## 2011-08-21 ENCOUNTER — Encounter: Payer: Self-pay | Admitting: Internal Medicine

## 2011-08-21 VITALS — BP 113/72 | HR 94 | Temp 97.6°F | Ht 73.0 in | Wt 206.0 lb

## 2011-08-21 DIAGNOSIS — L089 Local infection of the skin and subcutaneous tissue, unspecified: Secondary | ICD-10-CM

## 2011-08-21 DIAGNOSIS — A499 Bacterial infection, unspecified: Secondary | ICD-10-CM

## 2011-08-21 DIAGNOSIS — B9689 Other specified bacterial agents as the cause of diseases classified elsewhere: Secondary | ICD-10-CM

## 2011-08-21 MED ORDER — ACYCLOVIR 5 % EX OINT: TOPICAL_OINTMENT | CUTANEOUS | Status: DC

## 2011-08-22 ENCOUNTER — Telehealth: Payer: Self-pay | Admitting: *Deleted

## 2011-08-22 NOTE — Telephone Encounter (Signed)
They use [place]. The ointment is about $200 which they cannot afford. I discussed pricing variations at different pharmacies. I suggested she calll a few & see if she could find one with a lower price & I can send the rx to them. She will do this. She wanted md to know  To md to see if he is a candidate for the capsules

## 2011-08-22 NOTE — Telephone Encounter (Signed)
I spoke with the wife & told her we can send the rx to another pharmacy who will sell it for $4. She is at the nursing home now & states the nurses are going to call us to get the order & they can supply it. I told her that is fine .  I paged the md to get the strength desired. It is 400mg  qid. Called it to [place] at [facility] [phone]. . She asked if he could use abreva. I gave her Dr. Ephriam Knuckles number to ask him. The wife now wants abreva.

## 2011-08-22 NOTE — Telephone Encounter (Signed)
Sure, try the capsules, 4 times a day. $4 at [facility] or [facility].

## 2011-08-24 NOTE — Assessment & Plan Note (Signed)
MRSE and MSSE have grown now.  He is on appropriate therapy.  His current oral lesions are not consistent with Andria Meuse Johnson's syndrome or TEN with minimal mucous membrane involvement and resolving.  He also is having no other side effects with the linezolid.  He was reminded to not take his SSRI which was apparently restarted at the SNF but they were instructed to stop.  He will finish in 2 weeks.  No plan to reimplant the neurostimulator.  He will folllow up in 2 weeks, though if feeling ok, will cancel the appt and stop linezolid.

## 2011-08-24 NOTE — Progress Notes (Signed)
  Subjective:    Patient ID: [name], male    DOB: 05-11-1929, 78 y.o.   MRN: [id number]  HPI Comes in for follow up of neurostimulator infection.  He had it completely removed about 1 month ago and has been on linezolid since, of a projected 6 week course.  No fever, no chills, no pain or numbness in hands or feet.  He does have oral lesion around mouth for the last week.  Non tender.  Improving.    Review of Systems  Constitutional: Negative for fever, chills and fatigue.  HENT:       Facial lesion  Respiratory: Negative for cough and shortness of breath.   Cardiovascular: Negative for chest pain, palpitations and leg swelling.  Musculoskeletal: Negative for myalgias, joint swelling and arthralgias.  Skin: Negative for rash.  Hematological: Negative for adenopathy.       Objective:   Physical Exam  Constitutional: He appears well-developed and well-nourished. No distress.  HENT:       chelitis on both sides of mouth, no mucous membrane involvement at this time, drying lesions on lower and upper lips  Cardiovascular: Normal rate, regular rhythm and normal heart sounds.  Exam reveals no gallop and no friction rub.   No murmur heard. Pulmonary/Chest: Effort normal and breath sounds normal. No respiratory distress. He has no wheezes.  Skin: No rash noted.          Assessment & Plan:

## 2011-09-03 ENCOUNTER — Telehealth: Payer: Self-pay | Admitting: Licensed Clinical Social Worker

## 2011-09-03 NOTE — Telephone Encounter (Signed)
That's right.Thanks.

## 2011-09-03 NOTE — Telephone Encounter (Signed)
Patient's wife stated that Dr. Luciana Axe told her that she did not have to come to the f/u appt made for tomorrow on 09/04/2011 and she declined it on the automated reminder call. I also called her and she asked me to cancel it and send a note to Dr. Luciana Axe. She stated that the patient is taking his last dose of Zyvox today and will not be taking anything else that she recalls.

## 2011-09-04 ENCOUNTER — Ambulatory Visit: Payer: Medicare Other | Admitting: Internal Medicine

## 2011-09-12 ENCOUNTER — Observation Stay (HOSPITAL_COMMUNITY)
Admission: EM | Admit: 2011-09-12 | Discharge: 2011-09-14 | Disposition: A | Discharge status: Skilled Nursing Facility | Payer: Medicare Other | Attending: Internal Medicine | Admitting: Internal Medicine

## 2011-09-12 ENCOUNTER — Observation Stay (HOSPITAL_COMMUNITY): Payer: Medicare Other

## 2011-09-12 ENCOUNTER — Encounter (HOSPITAL_COMMUNITY): Payer: Self-pay

## 2011-09-12 DIAGNOSIS — F0280 Dementia in other diseases classified elsewhere without behavioral disturbance: Secondary | ICD-10-CM | POA: Insufficient documentation

## 2011-09-12 DIAGNOSIS — F05 Delirium due to known physiological condition: Secondary | ICD-10-CM

## 2011-09-12 DIAGNOSIS — K439 Ventral hernia without obstruction or gangrene: Secondary | ICD-10-CM

## 2011-09-12 DIAGNOSIS — R0600 Dyspnea, unspecified: Secondary | ICD-10-CM

## 2011-09-12 DIAGNOSIS — I872 Venous insufficiency (chronic) (peripheral): Secondary | ICD-10-CM

## 2011-09-12 DIAGNOSIS — Z860100 Personal history of colon polyps, unspecified: Secondary | ICD-10-CM

## 2011-09-12 DIAGNOSIS — L0889 Other specified local infections of the skin and subcutaneous tissue: Secondary | ICD-10-CM | POA: Insufficient documentation

## 2011-09-12 DIAGNOSIS — Z8659 Personal history of other mental and behavioral disorders: Secondary | ICD-10-CM

## 2011-09-12 DIAGNOSIS — R0602 Shortness of breath: Secondary | ICD-10-CM

## 2011-09-12 DIAGNOSIS — G3183 Dementia with Lewy bodies: Secondary | ICD-10-CM | POA: Insufficient documentation

## 2011-09-12 DIAGNOSIS — R5383 Other fatigue: Secondary | ICD-10-CM

## 2011-09-12 DIAGNOSIS — M1990 Unspecified osteoarthritis, unspecified site: Secondary | ICD-10-CM

## 2011-09-12 DIAGNOSIS — G20C Parkinsonism, unspecified: Secondary | ICD-10-CM

## 2011-09-12 DIAGNOSIS — D649 Anemia, unspecified: Secondary | ICD-10-CM

## 2011-09-12 DIAGNOSIS — B9689 Other specified bacterial agents as the cause of diseases classified elsewhere: Secondary | ICD-10-CM

## 2011-09-12 DIAGNOSIS — G20A1 Parkinson's disease without dyskinesia, without mention of fluctuations: Secondary | ICD-10-CM

## 2011-09-12 DIAGNOSIS — I4891 Unspecified atrial fibrillation: Secondary | ICD-10-CM

## 2011-09-12 DIAGNOSIS — I10 Essential (primary) hypertension: Secondary | ICD-10-CM

## 2011-09-12 DIAGNOSIS — Z8711 Personal history of peptic ulcer disease: Secondary | ICD-10-CM

## 2011-09-12 DIAGNOSIS — Z8719 Personal history of other diseases of the digestive system: Secondary | ICD-10-CM

## 2011-09-12 DIAGNOSIS — M6250 Muscle wasting and atrophy, not elsewhere classified, unspecified site: Secondary | ICD-10-CM | POA: Insufficient documentation

## 2011-09-12 DIAGNOSIS — K429 Umbilical hernia without obstruction or gangrene: Secondary | ICD-10-CM

## 2011-09-12 DIAGNOSIS — R5381 Other malaise: Secondary | ICD-10-CM

## 2011-09-12 DIAGNOSIS — G20 Parkinson's disease: Secondary | ICD-10-CM

## 2011-09-12 DIAGNOSIS — F0390 Unspecified dementia without behavioral disturbance: Secondary | ICD-10-CM

## 2011-09-12 DIAGNOSIS — I2510 Atherosclerotic heart disease of native coronary artery without angina pectoris: Secondary | ICD-10-CM

## 2011-09-12 DIAGNOSIS — E785 Hyperlipidemia, unspecified: Principal | ICD-10-CM

## 2011-09-12 LAB — COMPREHENSIVE METABOLIC PANEL
Albumin: 3.5 g/dL (ref 3.5–5.2)
BUN: 25 mg/dL — ABNORMAL HIGH (ref 6–23)
Calcium: 8.6 mg/dL (ref 8.4–10.5)
Chloride: 105 mEq/L (ref 96–112)
Creatinine, Ser: 1.21 mg/dL (ref 0.50–1.35)
GFR calc non Af Amer: 55 mL/min — ABNORMAL LOW (ref >90)
Total Bilirubin: 0.2 mg/dL — ABNORMAL LOW (ref 0.3–1.2)

## 2011-09-12 LAB — PROTIME-INR
INR: 1.13 (ref 0.00–1.49)
Prothrombin Time: 14.7 seconds (ref 11.6–15.2)

## 2011-09-12 LAB — CBC
HCT: 23.4 % — ABNORMAL LOW (ref 39.0–52.0)
Hemoglobin: 7.6 g/dL — ABNORMAL LOW (ref 13.0–17.0)
MCH: 29 pg (ref 26.0–34.0)
MCHC: 32.5 g/dL (ref 30.0–36.0)
MCV: 89.3 fL (ref 78.0–100.0)
RBC: 2.62 MIL/uL — ABNORMAL LOW (ref 4.22–5.81)

## 2011-09-12 LAB — OCCULT BLOOD, POC DEVICE: Fecal Occult Bld: NEGATIVE

## 2011-09-12 LAB — MRSA PCR SCREENING: MRSA by PCR: NEGATIVE

## 2011-09-12 LAB — APTT: aPTT: 33 seconds (ref 24–37)

## 2011-09-12 MED ORDER — IPRATROPIUM-ALBUTEROL 0.5-2.5 (3) MG/3ML IN SOLN: 3.0000 mL | RESPIRATORY_TRACT | Status: DC | PRN

## 2011-09-12 MED ORDER — ASPIRIN EC 81 MG PO TBEC
81.0000 mg | DELAYED_RELEASE_TABLET | Freq: Every day | ORAL | Status: DC
  Administered 2011-09-13 – 2011-09-14 (×2): 81 mg via ORAL
  Filled 2011-09-12 (×3): qty 1

## 2011-09-12 MED ORDER — CARBIDOPA-LEVODOPA 25-250 MG PO TABS
1.0000 | ORAL_TABLET | Freq: Four times a day (QID) | ORAL | Status: DC
  Administered 2011-09-12 – 2011-09-14 (×8): 1 via ORAL
  Filled 2011-09-12 (×12): qty 1

## 2011-09-12 MED ORDER — SODIUM CHLORIDE 0.9 % IJ SOLN: 3.0000 mL | INTRAMUSCULAR | Status: DC | PRN

## 2011-09-12 MED ORDER — PANTOPRAZOLE SODIUM 40 MG PO TBEC
40.0000 mg | DELAYED_RELEASE_TABLET | Freq: Every day | ORAL | Status: DC
  Administered 2011-09-13 – 2011-09-14 (×2): 40 mg via ORAL
  Filled 2011-09-12 (×2): qty 1

## 2011-09-12 MED ORDER — PRAMIPEXOLE DIHYDROCHLORIDE 0.25 MG PO TABS
0.5000 mg | ORAL_TABLET | Freq: Every day | ORAL | Status: DC
Start: 2011-09-12 — End: 2011-09-14
  Administered 2011-09-12 – 2011-09-14 (×10): 0.5 mg via ORAL
  Filled 2011-09-12 (×15): qty 2

## 2011-09-12 MED ORDER — IPRATROPIUM BROMIDE 0.02 % IN SOLN: 0.5000 mg | RESPIRATORY_TRACT | Status: DC | PRN

## 2011-09-12 MED ORDER — HYDROMORPHONE HCL PF 1 MG/ML IJ SOLN: 0.5000 mg | INTRAMUSCULAR | Status: DC | PRN

## 2011-09-12 MED ORDER — SODIUM CHLORIDE 0.9 % IJ SOLN
3.0000 mL | Freq: Two times a day (BID) | INTRAMUSCULAR | Status: DC
  Administered 2011-09-12 – 2011-09-13 (×2): 3 mL via INTRAVENOUS

## 2011-09-12 MED ORDER — HYDROCODONE-ACETAMINOPHEN 5-325 MG PO TABS: 1.0000 | ORAL_TABLET | ORAL | Status: DC | PRN

## 2011-09-12 MED ORDER — SACCHAROMYCES BOULARDII 250 MG PO CAPS
250.0000 mg | ORAL_CAPSULE | Freq: Two times a day (BID) | ORAL | Status: DC
  Administered 2011-09-12 – 2011-09-14 (×4): 250 mg via ORAL
  Filled 2011-09-12 (×5): qty 1

## 2011-09-12 MED ORDER — MOXIFLOXACIN HCL 400 MG PO TABS
400.0000 mg | ORAL_TABLET | Freq: Every day | ORAL | Status: DC
  Administered 2011-09-12 – 2011-09-14 (×3): 400 mg via ORAL
  Filled 2011-09-12 (×3): qty 1

## 2011-09-12 MED ORDER — ACETAMINOPHEN 325 MG PO TABS: 650.0000 mg | ORAL_TABLET | Freq: Four times a day (QID) | ORAL | Status: DC | PRN

## 2011-09-12 MED ORDER — SODIUM CHLORIDE 0.9 % IV SOLN: 250.0000 mL | INTRAVENOUS | Status: DC | PRN

## 2011-09-12 MED ORDER — ALBUTEROL SULFATE (5 MG/ML) 0.5% IN NEBU: 2.5000 mg | INHALATION_SOLUTION | RESPIRATORY_TRACT | Status: DC | PRN

## 2011-09-12 MED ORDER — ONDANSETRON HCL 4 MG/2ML IJ SOLN: 4.0000 mg | Freq: Four times a day (QID) | INTRAMUSCULAR | Status: DC | PRN

## 2011-09-12 MED ORDER — PAROXETINE HCL 20 MG PO TABS
20.0000 mg | ORAL_TABLET | ORAL | Status: DC
  Administered 2011-09-13 – 2011-09-14 (×2): 20 mg via ORAL
  Filled 2011-09-12 (×3): qty 1

## 2011-09-12 MED ORDER — ONDANSETRON HCL 4 MG PO TABS: 4.0000 mg | ORAL_TABLET | Freq: Four times a day (QID) | ORAL | Status: DC | PRN

## 2011-09-12 MED ORDER — AMANTADINE HCL 100 MG PO CAPS
100.0000 mg | ORAL_CAPSULE | Freq: Two times a day (BID) | ORAL | Status: DC
  Administered 2011-09-12 – 2011-09-14 (×4): 100 mg via ORAL
  Filled 2011-09-12 (×5): qty 1

## 2011-09-12 MED ORDER — CARBIDOPA-LEVODOPA 25-250 MG PO TABS
0.5000 | ORAL_TABLET | Freq: Every evening | ORAL | Status: DC | PRN
  Filled 2011-09-12: qty 1

## 2011-09-12 MED ORDER — BIOTENE DRY MOUTH MT LIQD
15.0000 mL | Freq: Two times a day (BID) | OROMUCOSAL | Status: DC
  Administered 2011-09-12 – 2011-09-14 (×4): 15 mL via OROMUCOSAL

## 2011-09-12 MED ORDER — VITAMIN B-6 50 MG PO TABS
50.0000 mg | ORAL_TABLET | Freq: Every day | ORAL | Status: DC
  Administered 2011-09-13 – 2011-09-14 (×2): 50 mg via ORAL
  Filled 2011-09-12 (×3): qty 1

## 2011-09-12 MED ORDER — ACETAMINOPHEN 650 MG RE SUPP: 650.0000 mg | Freq: Four times a day (QID) | RECTAL | Status: DC | PRN

## 2011-09-12 MED ORDER — POLYETHYLENE GLYCOL 3350 17 G PO PACK
17.0000 g | PACK | Freq: Every day | ORAL | Status: DC | PRN
  Filled 2011-09-12: qty 1

## 2011-09-12 MED ORDER — MIDODRINE HCL 5 MG PO TABS
5.0000 mg | ORAL_TABLET | Freq: Two times a day (BID) | ORAL | Status: DC
  Administered 2011-09-12 – 2011-09-13 (×3): 5 mg via ORAL
  Filled 2011-09-12 (×5): qty 1

## 2011-09-12 NOTE — ED Notes (Signed)
Pt family reports that pt recently was diagnosed with pneumonia in Left Lower lobe. Pt was started on Avalox on Wednesday.

## 2011-09-12 NOTE — Progress Notes (Signed)
Clinical Social Work Department BRIEF PSYCHOSOCIAL ASSESSMENT 09/12/2011  Patient:  [name], [name]     Account Number:  [id number]     Admit date:  09/12/2011  Clinical Social Worker:  Illene Silver  Date/Time:  09/12/2011 05:41 PM  Referred by:  Physician  Date Referred:  09/12/2011 Referred for  SNF Placement   Other Referral:   Interview type:   Other interview type:    PSYCHOSOCIAL DATA Living Status:  FACILITY Admitted from facility:  [facility] Level of care:  Skilled Nursing Facility Primary support name:  mrs Bondarenko (wife) Primary support relationship to patient:  FAMILY Degree of support available:   good    CURRENT CONCERNS  Other Concerns:    SOCIAL WORK ASSESSMENT / PLAN Pt is from [facility] and wife is holding his bed there.   Assessment/plan status:  Psychosocial Support/Ongoing Assessment of Needs Other assessment/ plan:   CSW will facilitate pt tx back to [facility] when medically appropriate.   Information/referral to community resources:    PATIENT'S/FAMILY'S RESPONSE TO PLAN OF CARE: Pt resting.  Pt's wife anxious re: bed hold at [facility]/financial concerns, concerns re: hospitalization, etc.  Emotional support offered.

## 2011-09-12 NOTE — Consult Note (Signed)
[facility] Consult: 3:55 PM 09/12/2011   Referring Provider: Dr Brien Few Primary Care Physician:  [name], MD Primary Gastroenterologist:  Dr. Lina Sar  Reason for Consultation:  anemia HPI: [name] is a 78 y.o. male.  2 GI bleeds in recent years.   Gastric ulcer in 2006, 2008.  Gastritis and healed ulcer in 05/2007.  Plavix has been off since 2008.  H Pylori negative.  Last GIOV was 09/2010 for constipation, bloating.  Has Atrial Fib, parkinson's, esophageal dysmotility and a host of other medical problems.  Dealing with staph  infected chest wall placed battery from deep brain stimulator since dec/Jan 2013.  Courses of several abx.  The batteries and all DBS hardware removed 07/24/2014.  W[facility] from [facility] to SNF on Zyvox 08/14/10 when Hgb measured 11.0.  No PPI on that d/c list, nor on current outpt list of meds.   Comes to ER today from SNF with SNF Hgb of 6.9.  In ED Hgb is 7.6 c/w 11 on 08/12/11.  Hemoccult negative at SNF and in ED. Has had no dark stools, no abd pain, no anorexia.  Eats a chopped diet because of dysphagia.  He was started on Avelox 2 days ago for LLL pneumonia.    Past Medical History  Diagnosis Date  . HYPERLIPIDEMIA-MIXED 02/10/2008  . PARKINSON'S DISEASE 02/10/2008  . HYPERTENSION, BENIGN 02/09/2009  . GASTROINTESTINAL HEMORRHAGE, HX OF 02/10/2008  . ANEMIA 04/29/2010  . FIBRILLATION, ATRIAL 04/29/2010  . VENTRAL HERNIA 04/29/2010  . DEGENERATIVE JOINT DISEASE 04/29/2010  . Gastric ulcer   . Positive PPD   . Internal hemorrhoids   . Hyperplastic colon polyp 2004  . CAD (coronary artery disease)     23.0 x 24 mm Taxus DES mid LAD 2005, DCA diagonal branch 2005  . VENOUS INSUFFICIENCY 02/10/2008    rt  . Shortness of breath   . Osteomyelitis   . Calculus of kidney   . Tuberculosis     78 yrs old  . GERD (gastroesophageal reflux disease)     ulcers hx  . Infection and inflammatory reaction due to  device, implant, and graft   . Neuromuscular disorder   . Sundowning     past lumbar decompressive surgery    Past Surgical History  Procedure Date  . Replacement total knee 2002    right  . Nose surgery   . Ankle surgery     left  . Toe amputation     right foot  . Cataract extraction   . Carpal tunnel release     left, open rt  . Tonsillectomy   . Appendectomy   . Coronary angioplasty with stent placement   . Back surgery 2006     back x3  . Deep brain stimulator placement 02    batteries changed 07  . Subthalamic stimulator battery replacement 02/27/2011    Procedure: SUBTHALAMIC STIMULATOR BATTERY REPLACEMENT;  Surgeon: Dorian Heckle, MD;  Location: [facility];  Service: Neurosurgery;  Laterality: Left;  DBS battery replacment x 2 - Left Chest and Abdomen  . Eye muscle surgery     bilat  . Subthalamic stimulator removal 05/22/2011    Procedure: SUBTHALAMIC STIMULATOR REMOVAL;  Surgeon: Maeola Harman, MD;  Location: [facility];  Service: Neurosurgery;  Laterality: N/A;  removal of IPG  . Subthalamic stimulator removal 07/24/2011    Procedure: SUBTHALAMIC STIMULATOR REMOVAL;  Surgeon: Maeola Harman, MD;  Location: [facility];  Service: Neurosurgery;  Laterality: N/A;  Deep Brain stimulator hardware removal  . Foot amputation through metatarsal     right    Prior to Admission medications   Medication Sig Start Date End Date Taking? Authorizing Provider  Docosanol (ABREVA EX) Apply 1 application topically 5 (five) times daily. For 1 week   Yes Historical Provider, MD  moxifloxacin (AVELOX) 400 MG tablet Take 400 mg by mouth daily. 7 day supply started   Yes Historical Provider, MD  pramipexole (MIRAPEX) 0.5 MG tablet Take 0.5 mg by mouth 5 (five) times daily.   Yes Historical Provider, MD  pyridOXINE (VITAMIN B-6) 50 MG tablet Take 50 mg by mouth daily.   Yes Historical Provider, MD  saccharomyces boulardii (FLORASTOR) 250 MG capsule Take 250 mg by mouth 2 (two) times  daily. Take for 10 days   Yes Historical Provider, MD  amantadine (SYMMETREL) 100 MG capsule Take 100 mg by mouth 2 (two) times daily.     Historical Provider, MD  aspirin EC 81 MG tablet Take 81 mg by mouth daily.    Historical Provider, MD  carbidopa-levodopa (SINEMET IR) 25-250 MG per tablet Take 1 tablet by mouth 4 (four) times daily. 1 tablet at 6AM, 10AM, 2noon, and 6PM    Historical Provider, MD  HYDROcodone-acetaminophen (NORCO) 5-325 MG per tablet Take 1-2 tablets by mouth every 6 (six) hours as needed. 1 tablet for moderate pain, 2 tablets for severe pain.    Historical Provider, MD  ipratropium-albuterol (DUONEB) 0.5-2.5 (3) MG/3ML SOLN Take 3 mLs by nebulization every 6 (six) hours as needed. Shortness of breath    Historical Provider, MD  midodrine (PROAMATINE) 5 MG tablet Take 5 mg by mouth 2 (two) times daily.     Historical Provider, MD  PARoxetine (PAXIL) 20 MG tablet Take 1 tablet (20 mg total) by mouth every morning. 10/02/10   [name], MD  polyethylene glycol (MIRALAX / GLYCOLAX) packet Take 17 g by mouth daily as needed. For constipation    Historical Provider, MD    Scheduled Meds:    . carbidopa-levodopa  1 tablet Oral QID  . moxifloxacin  400 mg Oral Daily  . pramipexole  0.5 mg Oral 5 X Daily   Infusions:   PRN Meds:    Allergies as of 09/12/2011 - Review Complete 09/12/2011  Allergen Reaction Noted  . Morphine Other (See Comments) 04/29/2010  . Nitroglycerin Other (See Comments) 09/12/2011  . Risperidone and related Other (See Comments) 02/17/2011  . Tuberculin tests Other (See Comments) 06/27/2011  . Vancomycin Rash 05/27/2011    Family History  Problem Relation Age of Onset  . Parkinsonism Sister   . Diabetes Brother   . Crohn's disease Daughter   . Heart disease Paternal Grandfather   . Colon cancer Neg Hx     History   Social History  . Marital Status: Married    Spouse Name: N/A    Number of Children: 4  . Years of Education: N/A    Occupational History  . retired Building surveyor professor    Social History Main Topics  . Smoking status: Never Smoker   . Smokeless tobacco: Never Used  . Alcohol Use: No  . Drug Use: No  . Sexually Active: No   Other Topics Concern  . Not on file   Social History Narrative  . No narrative on file    REVIEW OF SYSTEMS: Constitutional:  No weight loss ENT:  No nose bleeds Pulm:  Chronic oxygen at SNF CV:  No palps  or chest pain GU:  No hematuria GI:  As above Heme:  Getting transfused currently.    Transfusions:  In past and at present.  Neuro:  No headache.  parkinsons tremor and sxs severe but stable Derm:  No rash or itching Endocrine:  No excessive thirst Immunization:  Not querried Travel:  none MS:  Left shoulder pain.   PHYSICAL EXAM: Vital signs in last 24 hours: Temp:  [97.1 F (36.2 C)-98.4 F (36.9 C)] 98.4 F (36.9 C) (07/05 1530) Pulse Rate:  [70-92] 84  (07/05 1501) Resp:  [16-20] 16  (07/05 1501) BP: (118-141)/(70-95) 141/95 mmHg (07/05 1501) SpO2:  [96 %-100 %] 100 % (07/05 1501)  General: looks in frail health.  Aged wm. Head:  No facial edema.  No assymmetry.  Scars ontop of skull and at mastoid region on left are well healed  Eyes:  No icterus, + ectropion of lower lids.  Ears:  Not obviously HOH  Nose:  No discharge Mouth:  Lips dry.  Oral mucosa clear Neck:  No mass or JVD Lungs:  Clear.  BS diminshed.  Heart: RRR.  No MRG.   Abdomen:  Soft, NT, ND, no HSM, or bruits.  Healing ridge and intact scar in LUQ.  Umbilical and ventral hernia present. Rectal: not done.  RN earlier today found FOB negative brown stool   Musc/Skeltl: scar and deformity on right knee s/p TKR.  Extremities:  Amputated right forefoot.  Neurologic:  Tremulous, masked faces, moves all 4 limbs.  Speech dysarthric , slow but audible Tattoos:  None seen   Psych:  Pleasant  Intake/Output from previous day:   Intake/Output this shift: Total I/O In: 850 [I.V.:500;  Blood:350] Out: -   LAB RESULTS:  Basename 09/12/11 1242  WBC 8.0  HGB 7.6*  HCT 23.4*  PLT 266  MCV           89.3   BMET Lab Results  Component Value Date   NA 139 09/12/2011   NA 137 08/12/2011   NA 134* 08/08/2011   K 4.6 09/12/2011   K 4.4 08/12/2011   K 4.3 08/08/2011   CL 105 09/12/2011   CL 102 08/12/2011   CL 102 08/08/2011   CO2 23 09/12/2011   CO2 23 08/12/2011   CO2 20 08/08/2011   GLUCOSE 97 09/12/2011   GLUCOSE 91 08/12/2011   GLUCOSE 88 08/08/2011   BUN 25* 09/12/2011   BUN 28* 08/12/2011   BUN 35* 08/08/2011   CREATININE 1.21 09/12/2011   CREATININE 1.35 08/12/2011   CREATININE 1.54* 08/08/2011   CALCIUM 8.6 09/12/2011   CALCIUM 9.6 08/12/2011   CALCIUM 9.3 08/08/2011   LFT  Basename 09/12/11 1242  PROT 6.9  ALBUMIN 3.5  AST 23  ALT 5  ALKPHOS 88  BILITOT 0.2*  BILIDIR --  IBILI --   PT/INR Lab Results  Component Value Date   INR 1.13 09/12/2011   INR 1.01 09/24/2009   INR 1.02 09/23/2009   RADIOLOGY STUDIES: No results found.  ENDOSCOPIC STUDIES: 05/2007  EGD  To follow up gastric ulcer Persistent antral Gastritis, high risk for re-bleeding. Healed antral ulcer.  Will not resume Plavix.   01/2007   EGD  Gastric ulcer.  Gastric erosions.   06/2004   EGD Gastric ulcer.  06/2002   Colonoscopy Multiple diminutive polyps, Hyperplastic.   Int hemorrhoids.    IMPRESSION: *  recurrent anemia.  No evidence for GI bleed. *  Hx recurrent GI bleeds from gastric  ulcers and gastritis in 2005, 2008.  Current med list not showing PPI use at SNF, this needs to be corrected and addressed.  *  Parkinsons *  Several months of abx for infected battery of deep brain stimulator.  All hardware from brain, leads, abdominally and chest batteries removed in May 2013 at which time abx was switched to Zyvox *  New HAP and started on Avelox last week.   PLAN: *  Restart chronic PPI, oral is fine, I did this. *  Look for other sources for blood loss and anemia:  Retroperitoneal hematoma?,  med induced (Zyvox) anemia. *  Transfuse as doing.    LOS: 0 days   Jennye Moccasin  09/12/2011, 3:55 PM Pager: [phone]  GI ATTENDING  HX, LABS, X-RAYS, PRIOR ENDOSCOPY REPORTS REVIEWED. PATIENT SEEN AND EXAMINED. WIFE IN ROOM AND PARTICIPATES. AGREE WITH ABOVE H&P. ASKED TO SEE FOR INTERVAL ANEMIA AND HX PUD. NO HEMATEMESIS, MELENA, OR HEMATOCHEZIA. LAST BM LAST NIGHT. STOOL HEME NEGATIVE. BUN UNCHANGED FROM PREVIOUS.  IMPRESSION 1.SIGNIFICANT INTERVAL ANEMIA. NO EVIDENCE FOR GI BLEED AS A CAUSE. NO INDICATION FOR ENDOSCOPY. 2.HX PUD 3. MULTIPLE MEDICAL PROBLEMS  PLAN 1. LOOK FOR NON-GI CAUSES FOR ANEMIA SUCH A HEMATOLOGIC OR EXTRAINTESTINAL BLEEDING (OCCULT HEMATOMA FOR EXAMPLE). CONSIDER CT SCANNIG TO R/O RETROPERITONEAL, CNS (RECENT INTERVENTION) OR OTHER HEMATOMA. IF NEGATIVE, HEMATOLOGY EVALUATION. THIS WILL BE LEFT TO THE DISCRETION OF THE PRIMARY SERVICE. DISCUSSED WITH PATIENT AND WIFE.  2. EMPIRIC DAILY PPI  WE ARE AVAILABLE PRN. THANK YOU. WILL SIGN OFF.  Wilhemina Bonito. Eda Keys., M.D. [facility] Division of Gastroenterology

## 2011-09-12 NOTE — Progress Notes (Signed)
78 yo with complex PMH including GIB from PUD.  Presents from SNF with Hgb of 7.6.  Was 11.0 on 08/12/11.  No obvious bleeding.  2 units of PRBCs ordered in the ED.  This is a patient of Dr. Lina Sar, [facility] has been consulted.   Wife anxious over $$.  Wife wants patient discharged tomorrow.  Have also put in a consult to social work.  Algis Downs, PA-C [facility] Pager: [phone]

## 2011-09-12 NOTE — ED Provider Notes (Signed)
History     CSN: [id number]  Arrival date & time 09/12/11  1206   First MD Initiated Contact with Patient 09/12/11 1221      Chief Complaint  Patient presents with  . low hemoglobin     (Consider location/radiation/quality/duration/timing/severity/associated sxs/prior treatment) HPI Pt presents due to finding of anemia at his nursing facility.  Pt has been having some increased sob, fatigue.  Per wife has been using oxygen by [place] at nursing facility for past several days.  He was found to have an area of pneumonia and started on moxifloxacin prior to arrival.  Pt also with hx of parkinson's and has been having adjustments to his meds in order to control his symptoms.  Hx of bleeding ulcer in the past- no melena, no bright red blood in stool.  There are no other associated systemic symptoms, there are no alleviating or modifying factors.   Past Medical History  Diagnosis Date  . HYPERLIPIDEMIA-MIXED 02/10/2008  . PARKINSON'S DISEASE 02/10/2008  . HYPERTENSION, BENIGN 02/09/2009  . GASTROINTESTINAL HEMORRHAGE, HX OF 02/10/2008  . ANEMIA 04/29/2010  . FIBRILLATION, ATRIAL 04/29/2010  . VENTRAL HERNIA 04/29/2010  . DEGENERATIVE JOINT DISEASE 04/29/2010  . Gastric ulcer   . Positive PPD   . Internal hemorrhoids   . Hyperplastic colon polyp 2004  . CAD (coronary artery disease)     23.0 x 24 mm Taxus DES mid LAD 2005, DCA diagonal branch 2005  . VENOUS INSUFFICIENCY 02/10/2008    rt  . Shortness of breath   . Osteomyelitis   . Calculus of kidney   . Tuberculosis     78 yrs old  . GERD (gastroesophageal reflux disease)     ulcers hx  . Infection and inflammatory reaction due to device, implant, and graft   . Neuromuscular disorder   . Sundowning     past lumbar decompressive surgery    Past Surgical History  Procedure Date  . Replacement total knee 2002    right  . Nose surgery   . Ankle surgery     left  . Toe amputation     right foot  . Cataract extraction   . Carpal tunnel  release     left, open rt  . Tonsillectomy   . Appendectomy   . Coronary angioplasty with stent placement   . Back surgery 2006     back x3  . Deep brain stimulator placement 02    batteries changed 07  . Subthalamic stimulator battery replacement 02/27/2011    Procedure: SUBTHALAMIC STIMULATOR BATTERY REPLACEMENT;  Surgeon: Dorian Heckle, MD;  Location: [facility];  Service: Neurosurgery;  Laterality: Left;  DBS battery replacment x 2 - Left Chest and Abdomen  . Eye muscle surgery     bilat  . Subthalamic stimulator removal 05/22/2011    Procedure: SUBTHALAMIC STIMULATOR REMOVAL;  Surgeon: Maeola Harman, MD;  Location: [facility];  Service: Neurosurgery;  Laterality: N/A;  removal of IPG  . Subthalamic stimulator removal 07/24/2011    Procedure: SUBTHALAMIC STIMULATOR REMOVAL;  Surgeon: Maeola Harman, MD;  Location: [facility];  Service: Neurosurgery;  Laterality: N/A;  Deep Brain stimulator hardware removal  . Foot amputation through metatarsal     right    Family History  Problem Relation Age of Onset  . Parkinsonism Sister   . Diabetes Brother   . Crohn's disease Daughter   . Heart disease Paternal Grandfather   . Colon cancer Neg Hx  History  Substance Use Topics  . Smoking status: Never Smoker   . Smokeless tobacco: Never Used  . Alcohol Use: No      Review of Systems ROS reviewed and all otherwise negative except for mentioned in HPI  Allergies  Morphine; Nitroglycerin; Risperidone and related; Tuberculin tests; and Vancomycin  Home Medications   No current outpatient prescriptions on file.  BP 127/72  Pulse 60  Temp 97.5 F (36.4 C) (Oral)  Resp 18  Ht 6\' 1"  (1.854 m)  Wt 206 lb (93.441 kg)  BMI 27.18 kg/m2  SpO2 95% Vitals reviewed Physical Exam Physical Examination: General appearance - alert, well appearing, and in no distress Mental status - alert, oriented to person, place, and time Eyes - pupils equal and reactive, some conjunctival  pallor Mouth - mucous membranes moist, pharynx normal without lesions Chest - clear to auscultation, no wheezes, rales or rhonchi, symmetric air entry Heart - normal rate, regular rhythm, normal S1, S2, no murmurs, rubs, clicks or gallops Abdomen - soft, nontender, nondistended, no masses or organomegaly Rectal - negative without mass, lesions or tenderness, soft brown stool, no blood Extremities - peripheral pulses normal, no pedal edema, no clubbing or cyanosis Skin - normal coloration and turgor, no rashes Neuro- chronic rigidity, no weakness  ED Course  Procedures (including critical care time)   Date: 09/12/2011  Rate: 90  Rhythm: normal sinus rhythm  QRS Axis: normal  Intervals: PR prolonged  ST/T Wave abnormalities: normal  Conduction Disutrbances:none  Narrative Interpretation:   Old EKG Reviewed: no significant change from prior except the fact that PR is somewhat shorter    Labs Reviewed  CBC - Abnormal; Notable for the following:    RBC 2.62 (*)     Hemoglobin 7.6 (*)     HCT 23.4 (*)     RDW 15.8 (*)     All other components within normal limits  COMPREHENSIVE METABOLIC PANEL - Abnormal; Notable for the following:    BUN 25 (*)     Total Bilirubin 0.2 (*)     GFR calc non Af Amer 55 (*)     GFR calc Af Amer 64 (*)     All other components within normal limits  COMPREHENSIVE METABOLIC PANEL - Abnormal; Notable for the following:    GFR calc non Af Amer 58 (*)     GFR calc Af Amer 67 (*)     All other components within normal limits  CBC - Abnormal; Notable for the following:    WBC 10.6 (*)     RBC 3.09 (*)     Hemoglobin 9.0 (*)     HCT 27.3 (*)     RDW 15.9 (*)     All other components within normal limits  PROTIME-INR  APTT  TYPE AND SCREEN  PREPARE RBC (CROSSMATCH)  OCCULT BLOOD, POC DEVICE  MRSA PCR SCREENING  IRON AND TIBC  VITAMIN B12  FOLATE RBC   Dg Chest Port 1 View  09/12/2011  *RADIOLOGY REPORT*  Clinical Data: Short of breath.  Cough.   PORTABLE CHEST - 1 VIEW  Comparison: 07/24/2011.  Findings: Low volume chest with basilar atelectasis.  Cardiomegaly is present.  No focal consolidation is identified.  Based on opacity at the left lung base, airspace disease is difficult to exclude but not favored.  Consider repeat PA and lateral with full inspiration.  Cystic changes are present in the left humeral head may be degenerative or associated with AVN.  IMPRESSION:  1.  Low volume chest. 2.  Increased density at the lung bases probably represents atelectasis.  Original Report Authenticated By: Andreas Newport, M.D.     1. Anemia   2. Fatigue   3. Parkinson disease   4. CAD (coronary artery disease)   5. Dementia       MDM  Pt with symptomatic anemia (hgb dropped from baseline of 10-11)- new O2 requirement, increased fatigue.  2 Units PRBCs ordered, pt admited to [facility] for further management.         Ethelda Chick, MD 09/13/11 1230

## 2011-09-12 NOTE — ED Notes (Addendum)
Per EMS, pt from [facility] on [address]. Had routine blood work done and hgb reported of 6.9 today. Hemoocult was negative. Pt does have a documented hx of GI bleed and anemia but pt sts he had a gastric ulcer. Pt denies any new pain but c/o old pain to left shoulder and leg.  Pt is A/O x 4. Pt has a hx of Afib.  VSS by EMS. 150 palpated BP, HR 60-80, 96% on 2 liters. Pt is on oxygen at facility as well.

## 2011-09-12 NOTE — H&P (Signed)
PCP:   Lance Covey, MD   Chief Complaint:  Low hemoglobin.   HPI: This is a 78 year old male resident of [place] on [address], with known history of dementia, Parkinsonism, s/p deep brain stimulator 2007, finally removed on 07/24/11, previous history of colon polyps, endoscopically proven gastric ulcers, s/p bleed, 06/2004 and 01/2007, CAD, s/p PTCA x 2 in 2005, query atrial fibrillation, HTN, ventral hernia, dyslidemia, DJD s/p L3-5 and L5-6 laminectomies 2005-2006, L1-2 stenosis/radiculopathy,s/p decompressive laminectomy 2011, s/p right total knee arthroplasty, PVD, osteomyelitis toes right foot, s/p transmetatarsal amputation, childhood TB, renal calculi, chronic constipation, brought to the ED via EMS, for finding of hemoglobin of 6.9, on blood testing. No hematemesis, melena or hematochezia. In the ED, repeat hemoglobin was 7.6. Reportedly, baseline HB was 11.0 on 08/12/2011. Patient had neuro-stimulator infection and following removal of DBS on 07/24/11, was treated with Zyvox, under care of Dr [name], underwent rehab at [facility] unit in [facility], under care of Dr [name] (07/28/11-08/14/11). Patient was discahrged to [facility], and has been there since. Zyvox was completed about one week ago, and because of continuing symptoms of weakness/hypoxia, a CXR was done on 09/10/11, which reportedly showed LLL pneumonia, according to spouse. Patient has been on Avelox since then. Blood test done as part of his evaluation, revealed significant anemia, and patient's spouse was contacted this morning informed, and patient transported to ED.   Allergies:   Allergies  Allergen Reactions  . Morphine Other (See Comments)    unknown  . Nitroglycerin Other (See Comments)    unknown  . Risperidone And Related Other (See Comments)    unknown  . Tuberculin Tests Other (See Comments)    unknown  . Vancomycin Rash      Past Medical History  Diagnosis Date  . HYPERLIPIDEMIA-MIXED 02/10/2008  .  PARKINSON'S DISEASE 02/10/2008  . HYPERTENSION, BENIGN 02/09/2009  . GASTROINTESTINAL HEMORRHAGE, HX OF 02/10/2008  . ANEMIA 04/29/2010  . FIBRILLATION, ATRIAL 04/29/2010  . VENTRAL HERNIA 04/29/2010  . DEGENERATIVE JOINT DISEASE 04/29/2010  . Gastric ulcer   . Positive PPD   . Internal hemorrhoids   . Hyperplastic colon polyp 2004  . CAD (coronary artery disease)     23.0 x 24 mm Taxus DES mid LAD 2005, DCA diagonal branch 2005  . VENOUS INSUFFICIENCY 02/10/2008    rt  . Shortness of breath   . Osteomyelitis   . Calculus of kidney   . Tuberculosis     78 yrs old  . GERD (gastroesophageal reflux disease)     ulcers hx  . Infection and inflammatory reaction due to device, implant, and graft   . Neuromuscular disorder   . Sundowning     past lumbar decompressive surgery    Past Surgical History  Procedure Date  . Replacement total knee 2002    right  . Nose surgery   . Ankle surgery     left  . Toe amputation     right foot  . Cataract extraction   . Carpal tunnel release     left, open rt  . Tonsillectomy   . Appendectomy   . Coronary angioplasty with stent placement   . Back surgery 2006     back x3  . Deep brain stimulator placement 02    batteries changed 07  . Subthalamic stimulator battery replacement 02/27/2011    Procedure: SUBTHALAMIC STIMULATOR BATTERY REPLACEMENT;  Surgeon: Lance Heckle, MD;  Location: [facility];  Service:  Neurosurgery;  Laterality: Left;  DBS battery replacment x 2 - Left Chest and Abdomen  . Eye muscle surgery     bilat  . Subthalamic stimulator removal 05/22/2011    Procedure: SUBTHALAMIC STIMULATOR REMOVAL;  Surgeon: Lance Harman, MD;  Location: [facility];  Service: Neurosurgery;  Laterality: N/A;  removal of IPG  . Subthalamic stimulator removal 07/24/2011    Procedure: SUBTHALAMIC STIMULATOR REMOVAL;  Surgeon: Lance Harman, MD;  Location: [facility];  Service: Neurosurgery;  Laterality: N/A;  Deep Brain stimulator hardware removal    . Foot amputation through metatarsal     right    Prior to Admission medications   Medication Sig Start Date End Date Taking? Authorizing Provider  Docosanol (ABREVA EX) Apply 1 application topically 5 (five) times daily. For 1 week   Yes Historical Provider, MD  moxifloxacin (AVELOX) 400 MG tablet Take 400 mg by mouth daily. 7 day supply started   Yes Historical Provider, MD  pramipexole (MIRAPEX) 0.5 MG tablet Take 0.5 mg by mouth 5 (five) times daily.   Yes Historical Provider, MD  pyridOXINE (VITAMIN B-6) 50 MG tablet Take 50 mg by mouth daily.   Yes Historical Provider, MD  saccharomyces boulardii (FLORASTOR) 250 MG capsule Take 250 mg by mouth 2 (two) times daily. Take for 10 days   Yes Historical Provider, MD  amantadine (SYMMETREL) 100 MG capsule Take 100 mg by mouth 2 (two) times daily.     Historical Provider, MD  aspirin EC 81 MG tablet Take 81 mg by mouth daily.    Historical Provider, MD  carbidopa-levodopa (SINEMET IR) 25-250 MG per tablet Take 1 tablet by mouth 4 (four) times daily. 1 tablet at 6AM, 10AM, 2noon, and 6PM    Historical Provider, MD  HYDROcodone-acetaminophen (NORCO) 5-325 MG per tablet Take 1-2 tablets by mouth every 6 (six) hours as needed. 1 tablet for moderate pain, 2 tablets for severe pain.    Historical Provider, MD  ipratropium-albuterol (DUONEB) 0.5-2.5 (3) MG/3ML SOLN Take 3 mLs by nebulization every 6 (six) hours as needed. Shortness of breath    Historical Provider, MD  midodrine (PROAMATINE) 5 MG tablet Take 5 mg by mouth 2 (two) times daily.     Historical Provider, MD  PARoxetine (PAXIL) 20 MG tablet Take 1 tablet (20 mg total) by mouth every morning. 10/02/10   Lance Covey, MD  polyethylene glycol (MIRALAX / GLYCOLAX) packet Take 17 g by mouth daily as needed. For constipation    Historical Provider, MD    Social History: Patient is a retired Museum/gallery exhibitions officer, married with offspring. He reports that he has never smoked. He has never used  smokeless tobacco. He reports that he does not drink alcohol or use illicit drugs.  Family History  Problem Relation Age of Onset  . Parkinsonism Sister   . Diabetes Brother   . Crohn's disease Daughter   . Heart disease Paternal Grandfather   . Colon cancer Neg Hx     Review of Systems:  As per HPI and chief complaint. Patent is unable to provide history, but spouse is not aware of diminished appetite, weight loss, fever, chills, headache, blurred vision, chest pain, cough, shortness of breath, orthopnea, paroxysmal nocturnal dyspnea, nausea, diaphoresis, abdominal pain, vomiting, diarrhea, belching, heartburn, hematemesis, melena, dysuria, nocturia, urinary frequency, hematochezia, lower extremity swelling, pain, or redness. The rest of the systems review is negative.  Physical Exam:  General:  Patient does not appear to be in obvious acute  distress. Alert, following simple commands accurately, not short of breath at rest.  HEENT:  Moderate clinical pallor, no jaundice, no conjunctival injection or discharge. Hydration status is fair.  NECK:  Supple, JVP not seen, no carotid bruits, no palpable lymphadenopathy, no palpable goiter. CHEST:  Clinically clear to auscultation, no wheezes, no crackles. HEART:  Sounds 1 and 2 heard, normal, regular, no murmurs. ABDOMEN:  Full, soft, non-tender, umbilical hernia is evident and reducible, no palpable organomegaly, no palpable masses, normal bowel sounds. GENITALIA:  Not examined. LOWER EXTREMITIES:  No pitting edema,faintly palpable peripheral pulses. MUSCULOSKELETAL SYSTEM:  Generalized osteoarthritic changes, right foot TMA stump appears unremarkable. CENTRAL NERVOUS SYSTEM:  Moving all limbs, has Parkinsonian tremor, rigidity and facies.  Labs on Admission:  Results for orders placed during the hospital encounter of 09/12/11 (from the past 48 hour(s))  CBC     Status: Abnormal   Collection Time   09/12/11 12:42 PM      Component Value Range  Comment   WBC 8.0  4.0 - 10.5 K/uL    RBC 2.62 (*) 4.22 - 5.81 MIL/uL    Hemoglobin 7.6 (*) 13.0 - 17.0 g/dL    HCT 30.8 (*) 65.7 - 52.0 %    MCV 89.3  78.0 - 100.0 fL    MCH 29.0  26.0 - 34.0 pg    MCHC 32.5  30.0 - 36.0 g/dL    RDW 84.6 (*) 96.2 - 15.5 %    Platelets 266  150 - 400 K/uL   COMPREHENSIVE METABOLIC PANEL     Status: Abnormal   Collection Time   09/12/11 12:42 PM      Component Value Range Comment   Sodium 139  135 - 145 mEq/L    Potassium 4.6  3.5 - 5.1 mEq/L    Chloride 105  96 - 112 mEq/L    CO2 23  19 - 32 mEq/L    Glucose, Bld 97  70 - 99 mg/dL    BUN 25 (*) 6 - 23 mg/dL    Creatinine, Ser 9.52  0.50 - 1.35 mg/dL    Calcium 8.6  8.4 - 84.1 mg/dL    Total Protein 6.9  6.0 - 8.3 g/dL    Albumin 3.5  3.5 - 5.2 g/dL    AST 23  0 - 37 U/L    ALT 5  0 - 53 U/L    Alkaline Phosphatase 88  39 - 117 U/L    Total Bilirubin 0.2 (*) 0.3 - 1.2 mg/dL    GFR calc non Af Amer 55 (*) >90 mL/min    GFR calc Af Amer 64 (*) >90 mL/min   PROTIME-INR     Status: Normal   Collection Time   09/12/11 12:42 PM      Component Value Range Comment   Prothrombin Time 14.7  11.6 - 15.2 seconds    INR 1.13  0.00 - 1.49   APTT     Status: Normal   Collection Time   09/12/11 12:42 PM      Component Value Range Comment   aPTT 33  24 - 37 seconds   TYPE AND SCREEN     Status: Normal (Preliminary result)   Collection Time   09/12/11 12:42 PM      Component Value Range Comment   ABO/RH(D) A POS      Antibody Screen NEG      Sample Expiration 09/15/2011      Unit Number [id number]  Blood Component Type RED CELLS,LR      Unit division 00      Status of Unit ISSUED      Transfusion Status OK TO TRANSFUSE      Crossmatch Result Compatible      Unit Number [id number]      Blood Component Type RED CELLS,LR      Unit division 00      Status of Unit ALLOCATED      Transfusion Status OK TO TRANSFUSE      Crossmatch Result Compatible     PREPARE RBC (CROSSMATCH)     Status: Normal    Collection Time   09/12/11 12:42 PM      Component Value Range Comment   Order Confirmation ORDER PROCESSED BY BLOOD BANK     OCCULT BLOOD, POC DEVICE     Status: Normal   Collection Time   09/12/11  3:40 PM      Component Value Range Comment   Fecal Occult Bld NEGATIVE       Radiological Exams on Admission: No results found.  Assessment/Plan Principal Problem:  *Anemia: Patient present with incidental finding of moderate-severe anemia, against a background of fatigue/weakness. Baseline HB was 11.0 on 08/12/11, and patient has had no documented hematemesis, melena or hematochezia. He denies abdominal pain, and as a matter of fact, his FOBT is negative. As he has symptomatic anemia, he will be admitted of transfusion of PRBCs, after hematinic studies, including iron studies, B12 and Folate, are sent off. Given his complex GI history, GI consultation has been requested from Dr Lina Sar et all, and will be provided by Dr Yancey Flemings. Management will be per GI recommendation, but PPI has been commenced.  Active Problems:  1. CAD (coronary artery disease): Patient has known CAD, s/p stent to LAD and diagonal branch in 2005. He is currently on low dose ASA, which shall be continued. He is however, asymptomatic and stable from this viewpoint.  2. HTN (hypertension): Patient has a history of HTN, but review of pre-admission medications, reveal that he is currently on Midodrine, suggesting postural hypotension from Parkinsonism. Be that as it may, he is normotensive at this time. We shall observe for now.   3. Dementia/Parkinsonism: Per EMR, patient has a tendency to sundowning. We shall watch for this, during his hospitalization, and address accordingly.  4. Query Atrial fibrillation: Mention has been made in EMR, of a history of atrial fibrillation. I have discussed this with patient's spouse, and it appears that EKG artefact (muscle artefact) due to patient's Parkinsonian tremors, may have occasioned  misinterpretation of EKGs. Patient is currently in sinus rhythm.  5. Recent Pneumonia: Per spouse, patient was diagnosed with LLL pneumonia on 09/10/11, SNF, based on CXR findings. He has been on Avelox, since. He currently has no cough, is apyrexial, and wcc is normal at 8.0. We shall repeat CXR, and complete a 7-day course of antibiotic treatment.  Further management will depend on clinical course.  Comment: I did discuss code status with patient's spouse, and she confirmed that he is DNR/DNI.   Time Spent on Admission: 1 hour.   Ineta Sinning,CHRISTOPHER 09/12/2011, 4:54 PM

## 2011-09-12 NOTE — ED Notes (Signed)
Spoke with pt's wife.  Pt is from [facility] and she is holding his bed there.  Unit CSW will follow pt as an IP for dcp/tx to NH.

## 2011-09-13 ENCOUNTER — Observation Stay (HOSPITAL_COMMUNITY): Payer: Medicare Other

## 2011-09-13 DIAGNOSIS — I10 Essential (primary) hypertension: Secondary | ICD-10-CM

## 2011-09-13 LAB — IRON AND TIBC
Iron: 25 ug/dL — ABNORMAL LOW (ref 42–135)
UIBC: 71 ug/dL — ABNORMAL LOW (ref 125–400)

## 2011-09-13 LAB — COMPREHENSIVE METABOLIC PANEL
ALT: 6 U/L (ref 0–53)
AST: 19 U/L (ref 0–37)
Albumin: 3.5 g/dL (ref 3.5–5.2)
CO2: 24 mEq/L (ref 19–32)
Chloride: 105 mEq/L (ref 96–112)
Creatinine, Ser: 1.16 mg/dL (ref 0.50–1.35)
Potassium: 4.5 mEq/L (ref 3.5–5.1)
Sodium: 139 mEq/L (ref 135–145)
Total Bilirubin: 0.5 mg/dL (ref 0.3–1.2)

## 2011-09-13 LAB — CBC
MCH: 29.1 pg (ref 26.0–34.0)
MCHC: 33 g/dL (ref 30.0–36.0)
MCV: 88.3 fL (ref 78.0–100.0)
Platelets: 283 10*3/uL (ref 150–400)
RBC: 3.09 MIL/uL — ABNORMAL LOW (ref 4.22–5.81)

## 2011-09-13 LAB — TYPE AND SCREEN
Antibody Screen: NEGATIVE
Unit division: 0

## 2011-09-13 LAB — VITAMIN B12: Vitamin B-12: 742 pg/mL (ref 211–911)

## 2011-09-13 MED ORDER — CARBIDOPA-LEVODOPA 25-250 MG PO TABS
0.5000 | ORAL_TABLET | Freq: Every evening | ORAL | Status: DC | PRN
  Filled 2011-09-13: qty 1

## 2011-09-13 MED ORDER — POLYETHYLENE GLYCOL 3350 17 G PO PACK
17.0000 g | PACK | Freq: Every day | ORAL | Status: DC
  Administered 2011-09-14: 17 g via ORAL
  Filled 2011-09-13: qty 1

## 2011-09-13 MED ORDER — IOHEXOL 300 MG/ML  SOLN
100.0000 mL | Freq: Once | INTRAMUSCULAR | Status: AC | PRN
  Administered 2011-09-13: 100 mL via INTRAVENOUS

## 2011-09-13 NOTE — Discharge Summary (Signed)
Physician Discharge Summary  Patient ID: [name] MRN: [id number] DOB/AGE: 05/19/1929 78 y.o.  Admit date: 09/12/2011 Discharge date: 09/14/2011  Primary Care Physician:  [name], MD   Discharge Diagnoses:    Patient Active Problem List  Diagnosis  . HYPERLIPIDEMIA-MIXED  . PARKINSON'S DISEASE  . HYPERTENSION, BENIGN  . Coronary Atherosclerosis of Native Coronary Artery  . VENOUS INSUFFICIENCY  . Shortness of breath  . GASTROINTESTINAL HEMORRHAGE, HX OF  . ANEMIA  . FIBRILLATION, ATRIAL  . UMBILICAL HERNIA  . VENTRAL HERNIA  . DEGENERATIVE JOINT DISEASE  . GASTRIC ULCER, HX OF  . COLONIC POLYPS, HYPERPLASTIC, HX OF  . History of depression  . Dyspnea  . Skin infection, bacterial  . Sundowning  . Physical deconditioning  . Anemia  . CAD (coronary artery disease)  . HTN (hypertension)  . Dyslipidemia  . Dementia  . Parkinsonism    Medication List  As of 09/14/2011  9:02 AM   TAKE these medications         ABREVA EX   Apply 1 application topically 5 (five) times daily. For 1 week      amantadine 100 MG capsule   Commonly known as: SYMMETREL   Take 100 mg by mouth 2 (two) times daily.      aspirin EC 81 MG tablet   Take 81 mg by mouth daily.      carbidopa-levodopa 25-250 MG per tablet   Commonly known as: SINEMET IR   Take 0.5 tablets by mouth at bedtime as needed (Tremor/agitation).      carbidopa-levodopa 25-250 MG per tablet   Commonly known as: SINEMET IR   Take 1 tablet by mouth 4 (four) times daily. 1 tablet at 6AM, 10AM, 2noon, and 6PM      carbidopa-levodopa 25-250 MG per tablet   Commonly known as: SINEMET IR   Take 0.5 tablets by mouth at bedtime as needed. As needed for parkinson's symptoms      HYDROcodone-acetaminophen 5-325 MG per tablet   Commonly known as: NORCO   Take 1-2 tablets by mouth every 6 (six) hours as needed. 1 tablet for moderate pain, 2 tablets for severe pain.      ipratropium-albuterol 0.5-2.5 (3) MG/3ML Soln     Commonly known as: DUONEB   Take 3 mLs by nebulization every 6 (six) hours as needed. Shortness of breath      midodrine 5 MG tablet   Commonly known as: PROAMATINE   Take 5 mg by mouth 2 (two) times daily.      moxifloxacin 400 MG tablet   Commonly known as: AVELOX   Take 1 tablet (400 mg total) by mouth daily. 7 day supply started      pantoprazole 40 MG tablet   Commonly known as: PROTONIX   Take 1 tablet (40 mg total) by mouth daily at 6 (six) AM.      PARoxetine 20 MG tablet   Commonly known as: PAXIL   Take 1 tablet (20 mg total) by mouth every morning.      polyethylene glycol packet   Commonly known as: MIRALAX / GLYCOLAX   Take 17 g by mouth daily as needed. For constipation      pramipexole 0.5 MG tablet   Commonly known as: MIRAPEX   Take 0.5 mg by mouth 5 (five) times daily.      pyridOXINE 50 MG tablet   Commonly known as: VITAMIN B-6   Take 50 mg by mouth daily.  saccharomyces boulardii 250 MG capsule   Commonly known as: FLORASTOR   Take 250 mg by mouth 2 (two) times daily. Take for 10 days             Disposition and Follow-up:  Follow up with primary MD.  Consults:  GI. Dr Yancey Flemings.  Significant Diagnostic Studies:  Dg Chest Port 1 View  09/12/2011  *RADIOLOGY REPORT*  Clinical Data: Short of breath.  Cough.  PORTABLE CHEST - 1 VIEW  Comparison: 07/24/2011.  Findings: Low volume chest with basilar atelectasis.  Cardiomegaly is present.  No focal consolidation is identified.  Based on opacity at the left lung base, airspace disease is difficult to exclude but not favored.  Consider repeat PA and lateral with full inspiration.  Cystic changes are present in the left humeral head may be degenerative or associated with AVN.  IMPRESSION:  1.  Low volume chest. 2.  Increased density at the lung bases probably represents atelectasis.  Original Report Authenticated By: Andreas Newport, M.D.    Brief H and P: For complete details, refer to admission  H and P. However, in brief, this is a 78 year old male resident of [place] on [address], with known history of dementia, Parkinsonism, s/p deep brain stimulator 2007, finally removed on 07/24/11, previous history of colon polyps, endoscopically proven gastric ulcers, s/p bleed, 06/2004 and 01/2007, CAD, s/p PTCA x 2 in 2005, query atrial fibrillation, HTN, ventral hernia, dyslidemia, DJD s/p L3-5 and L5-6 laminectomies 2005-2006, L1-2 stenosis/radiculopathy,s/p decompressive laminectomy 2011, s/p right total knee arthroplasty, PVD, osteomyelitis toes right foot, s/p transmetatarsal amputation, childhood TB, renal calculi, chronic constipation, brought to the ED via EMS, for finding of hemoglobin of 6.9, on blood testing. No hematemesis, melena or hematochezia. In the ED, repeat hemoglobin was 7.6. Reportedly, baseline HB was 11.0 on 08/12/2011. Patient had neuro-stimulator infection and following removal of DBS on 07/24/11, was treated with Zyvox, under care of Dr Staci Righter, underwent rehab at [facility] unit in [facility], under care of Dr Faith Rogue (07/28/11-08/14/11). Patient was discharged to [facility], and has been there since. Zyvox was completed about one week ago, and because of continuing symptoms of weakness/hypoxia, a CXR was done on 09/10/11, which reportedly showed LLL pneumonia, according to spouse. Patient has been on Avelox since then. Blood test done as part of his evaluation, revealed significant anemia, and patient's spouse was contacted, and patient transported to ED. He was admitted for further evaluation, investigation and management.   Physical Exam: On 09/13/11. General: Comfortable, alert, communicative, not short of breath at rest.  HEENT: Mild clinical pallor, no jaundice, no conjunctival injection or discharge. Hydration status is satisfactory.  NECK: Supple, JVP not seen, no carotid bruits, no palpable lymphadenopathy, no palpable goiter.  CHEST: Clinically clear to auscultation, no  wheezes, no crackles.  HEART: Sounds 1 and 2 heard, normal, regular, no murmurs.  ABDOMEN: Full, soft, non-tender, umbilical hernia is evident and reducible, no palpable organomegaly, no palpable masses, normal bowel sounds.  GENITALIA: Not examined.  LOWER EXTREMITIES: No pitting edema, faintly palpable peripheral pulses.  MUSCULOSKELETAL SYSTEM: Generalized osteoarthritic changes, right foot TMA stump appears unremarkable.  CENTRAL NERVOUS SYSTEM: Moving all limbs, has Parkinsonian tremor, rigidity and facies.   Hospital Course:  Principal Problem:  *Anemia: Patient presented with an incidental finding of moderate-severe anemia, against a background of fatigue/weakness. Baseline HB was 11.0 on 08/12/11, and patient has had no documented hematemesis, melena or hematochezia. He denied abdominal pain, and  his FOBT was negative. As he had symptomatic anemia, he was transfused 2 units of PRBCs, after hematinic studies, including iron studies, B12 and Folate, with a satisfactory bump in hemoglobin to 9.0 on 09/13/11. Given his complex GI history, GI consultation was provided by Dr Yancey Flemings, who opined that GI blood loss is unlikely and has recommended work up for possible extra-intestinal etiology. No endoscopy is planned. Patient was commenced on PPI. Abdominal/pelvc CT showed no acute abnormalities involving the abdomen or pelvis. Large stool burden, scattered sigmoid colon diverticula without evidence of acute diverticulitis, 2 adjacent nonobstructing right lower pole renal calculi on the order of 2-3 mm, umbilical hernia containing fat and ectatic bilateral common iliac arteries, not significantly changed since 2007. There was a diverticulum arising from the left superior bladder wall. Miralax has been commenced for constipation. Patient may benefit from hematology consultation on an outpatient basis, for further evaluation of his anemia, particularly, if recurrent. SNF MD is recommended to check patient's  hemoglobin periodically. Care manager will attempt to arrange a referral, on 09/15/11. This has been discussed with patient's spouse, and she is agreeable. Active Problems:  1. CAD (coronary artery disease): Patient has known CAD, s/p stent to LAD and diagonal branch in 2005. He is currently on low dose ASA, which shall be continued. He is however, asymptomatic and stable from this viewpoint.  2. HTN (hypertension): Patient has a history of HTN, but review of pre-admission medications, reveal that he is currently on Midodrine, suggesting postural hypotension from Parkinsonism. He remained normotensive during his hospitalization.  3. Dementia/Parkinsonism: Per EMR, patient has a tendency to sundowning. This however, was not evident during his hospitalization.  4. Query Atrial fibrillation: Mention has been made in EMR, of a history of atrial fibrillation. I have discussed this with patient's spouse, and it appears that EKG artefact (muscle artefact) due to patient's Parkinsonian tremors, may have occasioned misinterpretation of EKGs. Patient was in sinus rhythm on telemetric monitoring.  5. Recent Pneumonia: Per spouse, patient was diagnosed with LLL pneumonia on 09/10/11, SNF, based on CXR findings. He has been on Avelox, since. He currently had no cough, remained apyrexial, and wcc was normal at presentation, at 8.0. Repeat CXR of 09/12/11, showed no evidence of consolidation, and he shall complete a 7-day course of antibiotic treatment by 09/17/11.   Comment: Clinically stable for discharge on 09/14/11.   Time spent on Discharge: 40 mins.  Signed: Sair Faulcon,CHRISTOPHER 09/14/2011, 9:02 AM

## 2011-09-13 NOTE — Progress Notes (Signed)
Subjective: Feels much better today. No new issues.   Objective: Vital signs in last 24 hours: Temp:  [97.1 F (36.2 C)-98.5 F (36.9 C)] 97.5 F (36.4 C) (07/06 0704) Pulse Rate:  [54-92] 60  (07/06 0704) Resp:  [16-20] 18  (07/06 0704) BP: (115-148)/(52-95) 127/72 mmHg (07/06 0704) SpO2:  [94 %-100 %] 95 % (07/06 0704) FiO2 (%):  [2 %] 2 % (07/06 0704) Weight:  [93.441 kg (206 lb)] 93.441 kg (206 lb) (07/05 2009) Weight change:  Last BM Date: 09/11/11  Intake/Output from previous day: 07/05 0701 - 07/06 0700 In: 1362.5 [I.V.:1000; Blood:362.5] Out: 200 [Urine:200]     Physical Exam: General: Comfortable, reading a newspaper. Alert, communicative following simple commands accurately, not short of breath at rest.  HEENT: Mild clinical pallor, no jaundice, no conjunctival injection or discharge. Hydration status is fair.  NECK: Supple, JVP not seen, no carotid bruits, no palpable lymphadenopathy, no palpable goiter.  CHEST: Clinically clear to auscultation, no wheezes, no crackles.  HEART: Sounds 1 and 2 heard, normal, regular, no murmurs.  ABDOMEN: Full, soft, non-tender, umbilical hernia is evident and reducible, no palpable organomegaly, no palpable masses, normal bowel sounds.  GENITALIA: Not examined.  LOWER EXTREMITIES: No pitting edema,faintly palpable peripheral pulses.  MUSCULOSKELETAL SYSTEM: Generalized osteoarthritic changes, right foot TMA stump appears unremarkable.  CENTRAL NERVOUS SYSTEM: Moving all limbs, has Parkinsonian tremor, rigidity and facies.     Lab Results:  [facility] 09/13/11 0620 09/12/11 1242  WBC 10.6* 8.0  HGB 9.0* 7.6*  HCT 27.3* 23.4*  PLT 283 266    Basename 09/13/11 0620 09/12/11 1242  NA 139 139  K 4.5 4.6  CL 105 105  CO2 24 23  GLUCOSE 99 97  BUN 20 25*  CREATININE 1.16 1.21  CALCIUM 8.9 8.6   Recent Results (from the past 240 hour(s))  MRSA PCR SCREENING     Status: Normal   Collection Time   09/12/11  8:18 PM   Component Value Range Status Comment   MRSA by PCR NEGATIVE  NEGATIVE Final      Studies/Results: Dg Chest Port 1 View  09/12/2011  *RADIOLOGY REPORT*  Clinical Data: Short of breath.  Cough.  PORTABLE CHEST - 1 VIEW  Comparison: 07/24/2011.  Findings: Low volume chest with basilar atelectasis.  Cardiomegaly is present.  No focal consolidation is identified.  Based on opacity at the left lung base, airspace disease is difficult to exclude but not favored.  Consider repeat PA and lateral with full inspiration.  Cystic changes are present in the left humeral head may be degenerative or associated with AVN.  IMPRESSION:  1.  Low volume chest. 2.  Increased density at the lung bases probably represents atelectasis.  Original Report Authenticated By: Andreas Newport, M.D.    Medications: Scheduled Meds:   . amantadine  100 mg Oral BID  . antiseptic oral rinse  15 mL Mouth Rinse BID  . aspirin EC  81 mg Oral Daily  . carbidopa-levodopa  1 tablet Oral QID  . midodrine  5 mg Oral BID  . moxifloxacin  400 mg Oral Daily  . pantoprazole  40 mg Oral Q0600  . PARoxetine  20 mg Oral BH-q7a  . pramipexole  0.5 mg Oral 5 X Daily  . pyridOXINE  50 mg Oral Daily  . saccharomyces boulardii  250 mg Oral BID  . sodium chloride  3 mL Intravenous Q12H  . sodium chloride  3 mL Intravenous Q12H   Continuous Infusions:  PRN Meds:.sodium chloride, acetaminophen, acetaminophen, albuterol, carbidopa-levodopa, HYDROcodone-acetaminophen, HYDROmorphone (DILAUDID) injection, ipratropium, ondansetron (ZOFRAN) IV, ondansetron, polyethylene glycol, sodium chloride, DISCONTD: carbidopa-levodopa, DISCONTD: ipratropium-albuterol  Assessment/Plan:  Principal Problem:  *Anemia: Patient presented with incidental finding of moderate-severe anemia, against a background of fatigue/weakness. Baseline HB was 11.0 on 08/12/11, and patient has had no documented hematemesis, melena or hematochezia. He denies abdominal pain, and as a  matter of fact, his FOBT is negative. As he had symptomatic anemia, he was transfused 2units of PRBCs, after hematinic studies, including iron studies, B12 and Folate. Report is still pending. Given his complex GI history, GI consultation was provided by Dr Yancey Flemings, who has opined that GI blood loss is unlikely and has recommended work up for possible extra-intestinal etiology. No endoscopy is planned. PPI has been commenced. Abdominal/pelvc CT has been arranged. HB is 9.0 today.  Active Problems:  1. CAD (coronary artery disease): Patient has known CAD, s/p stent to LAD and diagonal branch in 2005. He is currently on low dose ASA, which shall be continued. He is however, asymptomatic and stable from this viewpoint.  2. HTN (hypertension): Patient has a history of HTN, but review of pre-admission medications, reveal that he is currently on Midodrine, suggesting postural hypotension from Parkinsonism. He is normotensive at this time. We shall observe for now.  3. Dementia/Parkinsonism: Per EMR, patient has a tendency to sundowning. We shall watch for this, during his hospitalization, and address accordingly.  4. Query Atrial fibrillation: Mention has been made in EMR, of a history of atrial fibrillation. I have discussed this with patient's spouse, and it appears that EKG artefact (muscle artefact) due to patient's Parkinsonian tremors, may have occasioned misinterpretation of EKGs. Patient is currently in sinus rhythm.  5. Recent Pneumonia: Per spouse, patient was diagnosed with LLL pneumonia on 09/10/11, SNF, based on CXR findings. He has been on Avelox, since. He currently has no cough, is apyrexial, and wcc was normal at presentation, at 8.0. Repeat CXR of 09/12/11, shows no evidence of consolidation, and We shall complete a 7-day course of antibiotic treatment by 09/17/11.      LOS: 1 day   Keir Foland,CHRISTOPHER 09/13/2011, 9:51 AM

## 2011-09-13 NOTE — Evaluation (Signed)
Physical Therapy Evaluation Patient Details Name: MERLYN CONLEY MRN: [id number] DOB: [birth year] Today's Date: 09/13/2011 Time: 1478-2956 PT Time Calculation (min): 23 min  PT Assessment / Plan / Recommendation Clinical Impression  Pt adm with anemia.  Pt with recent hospitalization and CIR stay.  Pt with Parkinsons which significantly limits his mobility. Needs skilled PT to maximize I and safety to decr burden of care. Recommend [facility].    PT Assessment  Patient needs continued PT services    Follow Up Recommendations  Skilled nursing facility    Barriers to Discharge        Equipment Recommendations  Defer to next venue    Recommendations for Other Services     Frequency Min 2X/week    Precautions / Restrictions Precautions Precautions: Fall   Pertinent Vitals/Pain SaO2 96% on 2L and RA at rest.  Pt SOB with amb on RA but unable to get SaO2 reading.      Mobility  Bed Mobility Bed Mobility: Supine to Sit;Sitting - Scoot to [place] of Bed;Sit to Supine;Scooting to [facility] Supine to Sit: 3: Mod assist;HOB elevated Sitting - Scoot to Edge of Bed: 3: Mod assist Sit to Supine: 1: +2 Total assist Sit to Supine: Patient Percentage: 30% Scooting to HOB: 1: +2 Total assist Scooting to [facility]: Patient Percentage: 10% Details for Bed Mobility Assistance: Verbal cues and manual facilitation to initiate movement.  assist to bring trunk up. Transfers Transfers: Sit to Stand;Stand to [employer] Transfers Sit to Stand: 3: Mod assist;With upper extremity assist;From bed;With armrests;From chair/3-in-1;1: +2 Total assist Sit to Stand: Patient Percentage: 60% Stand to Sit: 3: Mod assist;With armrests;To chair/3-in-1;To bed Stand Pivot Transfers: 1: +2 Total assist Stand Pivot Transfers: Patient Percentage: 60% Details for Transfer Assistance: Perform stand pivot to [facility] without walker and pt required more assist.  Pt with better mobility with walker.  Pt with posterior lean and  needs cues to bring wt anteriorly for sit to stand. Ambulation/Gait Ambulation/Gait Assistance: 3: Mod assist (+1 for lines and safety.) Ambulation Distance (Feet): 125 Feet Assistive device: Rolling walker Ambulation/Gait Assistance Details: Frequent verbal/tactile cues to stay closer to walker and to pick up feet. Gait Pattern: Step-through pattern;Trunk flexed;Shuffle;Decreased step length - left;Decreased step length - right    Exercises     PT Diagnosis: Difficulty walking;Abnormality of gait;Generalized weakness  PT Problem List: Decreased strength;Decreased activity tolerance;Decreased balance;Decreased mobility;Decreased coordination;Decreased knowledge of use of DME PT Treatment Interventions: DME instruction;Gait training;Functional mobility training;Therapeutic activities;Therapeutic exercise;Balance training;Patient/family education   PT Goals Acute Rehab PT Goals PT Goal Formulation: With patient Time For Goal Achievement: 09/20/11 Potential to Achieve Goals: Good Pt will go Supine/Side to Sit: with min assist PT Goal: Supine/Side to Sit - Progress: Goal set today Pt will go Sit to Supine/Side: with min assist PT Goal: Sit to Supine/Side - Progress: Goal set today Pt will go Sit to Stand: with min assist PT Goal: Sit to Stand - Progress: Goal set today Pt will go Stand to Sit: with min assist PT Goal: Stand to Sit - Progress: Goal set today Pt will Ambulate: 51 - 150 feet;with min assist;with least restrictive assistive device PT Goal: Ambulate - Progress: Goal set today  Visit Information  Last PT Received On: 09/13/11 Assistance Needed: +2    Subjective Data  Subjective: 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 Patient Stated Goal: Pt agreeable to working toward incr mobility   Prior Functioning  Home Living Available Help at  Discharge: Skilled Nursing Facility Type of Home: Skilled Nursing Facility Prior Function Level of Independence: Needs  assistance Needs Assistance: Bathing;Dressing;Toileting;Gait;Transfers Driving: No Vocation: Retired Musician: Clinical cytogeneticist  Overall Cognitive Status: History of cognitive impairments - at baseline Arousal/Alertness: Awake/alert Behavior During Session: WFL for tasks performed    Extremity/Trunk Assessment Right Lower Extremity Assessment RLE ROM/Strength/Tone: Deficits RLE ROM/Strength/Tone Deficits: grossly 3+/5 and with difficulty initiating movement RLE Coordination: Deficits RLE Coordination Deficits: decr due to tremor and difficulty initiating movement Left Lower Extremity Assessment LLE ROM/Strength/Tone: Deficits LLE ROM/Strength/Tone Deficits: grossly 3+/5 and with difficulty initiating movement LLE Coordination: Deficits LLE Coordination Deficits: decr due to tremor and difficulty initiating movement   Balance Static Standing Balance Static Standing - Balance Support: Right upper extremity supported;Left upper extremity supported;During functional activity (on walker) Static Standing - Level of Assistance: 4: Min assist (once balance established)  End of Session PT - End of Session Equipment Utilized During Treatment: Gait belt Activity Tolerance: Patient tolerated treatment well Patient left: in bed;with call bell/phone within reach;with bed alarm set;with family/visitor present Nurse Communication: Mobility status  GP Functional Assessment Tool Used: Clinical judgement Functional Limitation: Mobility: Walking and moving around Mobility: Walking and Moving Around Current Status [phone]): At least 40 percent but less than 60 percent impaired, limited or restricted Mobility: Walking and Moving Around Goal Status [phone]): At least 20 percent but less than 40 percent impaired, limited or restricted   [facility] 09/13/2011, 3:29 PM  [facility] PT [phone]

## 2011-09-14 DIAGNOSIS — I10 Essential (primary) hypertension: Secondary | ICD-10-CM

## 2011-09-14 LAB — CBC
HCT: 29 % — ABNORMAL LOW (ref 39.0–52.0)
Hemoglobin: 9.6 g/dL — ABNORMAL LOW (ref 13.0–17.0)
MCH: 29.4 pg (ref 26.0–34.0)
MCHC: 33.1 g/dL (ref 30.0–36.0)
MCV: 89 fL (ref 78.0–100.0)
RBC: 3.26 MIL/uL — ABNORMAL LOW (ref 4.22–5.81)
WBC: 9.8 10*3/uL (ref 4.0–10.5)

## 2011-09-14 LAB — BASIC METABOLIC PANEL
BUN: 20 mg/dL (ref 6–23)
CO2: 23 mEq/L (ref 19–32)
Calcium: 9.3 mg/dL (ref 8.4–10.5)
Creatinine, Ser: 1.16 mg/dL (ref 0.50–1.35)
Glucose, Bld: 101 mg/dL — ABNORMAL HIGH (ref 70–99)

## 2011-09-14 MED ORDER — PANTOPRAZOLE SODIUM 40 MG PO TBEC: 40.0000 mg | DELAYED_RELEASE_TABLET | Freq: Every day | ORAL | Status: AC

## 2011-09-14 MED ORDER — CARBIDOPA-LEVODOPA 25-250 MG PO TABS: 0.5000 | ORAL_TABLET | Freq: Every evening | ORAL | Status: DC | PRN

## 2011-09-14 MED ORDER — MOXIFLOXACIN HCL 400 MG PO TABS: 400.0000 mg | ORAL_TABLET | Freq: Every day | ORAL | Status: AC

## 2011-09-14 NOTE — Progress Notes (Signed)
Patient being discharged this am to [facility]. Report called to facility. IV site removed. Discharge papers reviewed with wife. Harless Litten, RN.

## 2011-09-14 NOTE — Progress Notes (Signed)
CSW was consulted to complete discharge of patient. Discharge packet complete with signed FL2, chart hard copy. [facility] called to transport patient. Patient's wife was aware and agreeable to plant. CSW signing off.  Lia Foyer, LCSWA [facility] Clinical Social Worker Contact #: [phone] (weekend)

## 2011-09-15 ENCOUNTER — Telehealth: Payer: Self-pay | Admitting: Oncology

## 2011-09-15 NOTE — Progress Notes (Signed)
Quick Note:  Please advise on pt lab in Dr Burchette's absence ______

## 2011-09-15 NOTE — Telephone Encounter (Signed)
S/w the pt's wife and she is aware of the new pt appt on 09/19/2011 with dr Gaylyn Rong

## 2011-09-16 ENCOUNTER — Telehealth: Payer: Self-pay | Admitting: Oncology

## 2011-09-16 NOTE — Telephone Encounter (Signed)
Referred by Dr. Brien Few Dx- Anemia

## 2011-09-18 ENCOUNTER — Encounter: Payer: Self-pay | Admitting: Oncology

## 2011-09-19 ENCOUNTER — Other Ambulatory Visit (HOSPITAL_BASED_OUTPATIENT_CLINIC_OR_DEPARTMENT_OTHER): Payer: Medicare Other | Admitting: Lab

## 2011-09-19 ENCOUNTER — Encounter: Payer: Self-pay | Admitting: Oncology

## 2011-09-19 ENCOUNTER — Ambulatory Visit: Payer: Medicare Other

## 2011-09-19 ENCOUNTER — Ambulatory Visit (HOSPITAL_BASED_OUTPATIENT_CLINIC_OR_DEPARTMENT_OTHER): Payer: Medicare Other | Admitting: Oncology

## 2011-09-19 ENCOUNTER — Telehealth: Payer: Self-pay | Admitting: Oncology

## 2011-09-19 VITALS — BP 119/61 | HR 56 | Temp 96.9°F | Ht 73.0 in | Wt 202.2 lb

## 2011-09-19 DIAGNOSIS — D649 Anemia, unspecified: Secondary | ICD-10-CM

## 2011-09-19 DIAGNOSIS — R718 Other abnormality of red blood cells: Secondary | ICD-10-CM

## 2011-09-19 DIAGNOSIS — I129 Hypertensive chronic kidney disease with stage 1 through stage 4 chronic kidney disease, or unspecified chronic kidney disease: Secondary | ICD-10-CM

## 2011-09-19 LAB — MORPHOLOGY: PLT EST: ADEQUATE

## 2011-09-19 LAB — CBC & DIFF AND RETIC
BASO%: 0.3 % (ref 0.0–2.0)
EOS%: 11.6 % — ABNORMAL HIGH (ref 0.0–7.0)
HCT: 28.2 % — ABNORMAL LOW (ref 38.4–49.9)
LYMPH%: 12.4 % — ABNORMAL LOW (ref 14.0–49.0)
MCH: 29.6 pg (ref 27.2–33.4)
MCHC: 32.6 g/dL (ref 32.0–36.0)
NEUT%: 65.2 % (ref 39.0–75.0)
Platelets: 267 10*3/uL (ref 140–400)
lymph#: 1 10*3/uL (ref 0.9–3.3)

## 2011-09-19 LAB — CHCC SMEAR

## 2011-09-19 NOTE — Progress Notes (Signed)
[facility]  Telephone:(336) [phone] [phone]     INITIAL HEMATOLOGY CONSULTATION    Referral MD:  Dr. Evelena Peat, M.D.  Reason for Referral: normocytic anemia.     HPI:  [name] is a 78 year-old man with multiple medical history notable for Parkinson, HTN, HLP, CAD, CKD.  His baseline Hgb over the past few years were around 11.  He had neuro-stimulator infection and following removal of DBS on 07/24/11, was treated with Zyvox, under care of Dr Staci Righter, underwent rehab at [facility] unit in [facility], under care of Dr Faith Rogue (07/28/11-08/14/11). He was discharged to [facility] place.  Zyvox was completed late June 2013.  He had symptoms of weakness/hypoxia, a CXR was done on 09/10/11, which reportedly showed LLL pneumonia for which he was placed on Avelox.  His Hgb was checked on 09/12/2011 and turned out to be low at 7.6.  He was admitted to [facility].  He received 2 units of pRBC with appropriate increase of his Hgb.  He was evaluated by GI.  He had colonoscopy about 8 years ago and was reportedly negative per patient.  During this past admission, his ferritin was not low; he did not have hem occult positive.  Thus, GI consult thought that endoscopic evaluation was not indicated.  He was kindly referred to the [facility] for evaluation of his anemia.   [name] presented to the clinic today for the first time with his wife.  He has problem enunciating due to Parkinson.  He could give short yes/no answers.  Therefore, his wife gave most of the history.  He still has some SOB, DOE, fatigue.  However, these have improved.  He has difficulty ambulating due to his Parkinson's.  He denied any visible source of bleeding.  He has chronic back pain for many years.    Patient denies fever, anorexia, weight loss, headache, visual changes, confusion, drenching night sweats, palpable lymph node swelling, mucositis, odynophagia, dysphagia, nausea vomiting, jaundice, chest  pain, palpitation, productive cough, gum bleeding, epistaxis, hematemesis, hemoptysis, abdominal pain, abdominal swelling, early satiety, melena, hematochezia, hematuria, skin rash, spontaneous bleeding, joint swelling, joint pain, heat or cold intolerance, bowel bladder incontinence, paresthesia, depression, suicidal or homocidal ideation, feeling hopelessness.   Past Medical History  Diagnosis Date  . HYPERLIPIDEMIA-MIXED 02/10/2008  . PARKINSON'S DISEASE 02/10/2008  . HYPERTENSION, BENIGN 02/09/2009  . GASTROINTESTINAL HEMORRHAGE, HX OF 02/10/2008  . ANEMIA 04/29/2010  . FIBRILLATION, ATRIAL 04/29/2010  . VENTRAL HERNIA 04/29/2010  . DEGENERATIVE JOINT DISEASE 04/29/2010  . Gastric ulcer   . Positive PPD   . Internal hemorrhoids   . Hyperplastic colon polyp 2004  . CAD (coronary artery disease)     23.0 x 24 mm Taxus DES mid LAD 2005, DCA diagonal branch 2005  . VENOUS INSUFFICIENCY 02/10/2008    rt  . Shortness of breath   . Osteomyelitis   . Calculus of kidney   . Tuberculosis     78 yrs old  . GERD (gastroesophageal reflux disease)     ulcers hx  . Infection and inflammatory reaction due to device, implant, and graft   . Neuromuscular disorder   . Sundowning     past lumbar decompressive surgery  . Chronic kidney disease (CKD), stage III (moderate)   :    Past Surgical History  Procedure Date  . Replacement total knee 2002    right  . Nose surgery   . Ankle surgery  left  . Toe amputation     right foot  . Cataract extraction   . Carpal tunnel release     left, open rt  . Tonsillectomy   . Appendectomy   . Coronary angioplasty with stent placement   . Back surgery 2006     back x3  . Deep brain stimulator placement 02    batteries changed 07  . Subthalamic stimulator battery replacement 02/27/2011    Procedure: SUBTHALAMIC STIMULATOR BATTERY REPLACEMENT;  Surgeon: Dorian Heckle, MD;  Location: [facility];  Service: Neurosurgery;  Laterality: Left;  DBS  battery replacment x 2 - Left Chest and Abdomen  . Eye muscle surgery     bilat  . Subthalamic stimulator removal 05/22/2011    Procedure: SUBTHALAMIC STIMULATOR REMOVAL;  Surgeon: Maeola Harman, MD;  Location: [facility];  Service: Neurosurgery;  Laterality: N/A;  removal of IPG  . Subthalamic stimulator removal 07/24/2011    Procedure: SUBTHALAMIC STIMULATOR REMOVAL;  Surgeon: Maeola Harman, MD;  Location: [facility];  Service: Neurosurgery;  Laterality: N/A;  Deep Brain stimulator hardware removal  . Foot amputation through metatarsal     right  :   CURRENT MEDS: Current Outpatient Prescriptions  Medication Sig Dispense Refill  . amantadine (SYMMETREL) 100 MG capsule Take 100 mg by mouth 2 (two) times daily.       Marland Kitchen aspirin EC 81 MG tablet Take 81 mg by mouth daily.      . carbidopa-levodopa (SINEMET IR) 25-250 MG per tablet Take 0.5 tablets by mouth at bedtime as needed. As needed for parkinson's symptoms      . Docosanol (ABREVA EX) Apply 1 application topically 5 (five) times daily. For 1 week      . HYDROcodone-acetaminophen (NORCO) 5-325 MG per tablet Take 1-2 tablets by mouth every 6 (six) hours as needed. 1 tablet for moderate pain, 2 tablets for severe pain.      . midodrine (PROAMATINE) 5 MG tablet Take 5 mg by mouth 2 (two) times daily.       . pantoprazole (PROTONIX) 40 MG tablet Take 1 tablet (40 mg total) by mouth daily at 6 (six) AM.  30 tablet  3  . PARoxetine (PAXIL) 20 MG tablet Take 1 tablet (20 mg total) by mouth every morning.  30 tablet  11  . polyethylene glycol (MIRALAX / GLYCOLAX) packet Take 17 g by mouth daily as needed. For constipation      . pramipexole (MIRAPEX) 0.5 MG tablet Take 0.5 mg by mouth 5 (five) times daily.      Marland Kitchen pyridOXINE (VITAMIN B-6) 50 MG tablet Take 50 mg by mouth daily.      Marland Kitchen saccharomyces boulardii (FLORASTOR) 250 MG capsule Take 250 mg by mouth 2 (two) times daily. Take for 10 days      . DISCONTD: clonazePAM (KLONOPIN) 0.5 MG tablet  Take 0.5 mg by mouth at bedtime as needed.          Allergies  Allergen Reactions  . Morphine Other (See Comments)    unknown  . Nitroglycerin Other (See Comments)    unknown  . Risperidone And Related Other (See Comments)    unknown  . Tuberculin Tests Other (See Comments)    unknown  . Vancomycin Rash  :  Family History  Problem Relation Age of Onset  . Parkinsonism Sister   . Diabetes Brother   . Crohn's disease Daughter   . Heart disease Paternal Grandfather   .  Colon cancer Neg Hx   :  History   Social History  . Marital Status: Married    Spouse Name: N/A    Number of Children: 4  . Years of Education: N/A   Occupational History  . retired Building surveyor professor    Social History Main Topics  . Smoking status: Never Smoker   . Smokeless tobacco: Never Used  . Alcohol Use: No  . Drug Use: No  . Sexually Active: No   Other Topics Concern  . Not on file   Social History Narrative  . No narrative on file  :  REVIEW OF SYSTEM:  The rest of the 14-point review of sytem was negative.   Exam: ECOG 3 due to Parkinson's.   General:  Well-nourished man, in no acute distress.  Eyes:  no scleral icterus.  ENT:  There were no oropharyngeal lesions.  Neck was without thyromegaly.  Lymphatics:  Negative cervical, supraclavicular or axillary adenopathy.  Respiratory: lungs were clear bilaterally without wheezing or crackles.  Cardiovascular:  Regular rate and rhythm on today exam, S1/S2, without murmur, rub or gallop.  There was no pedal edema.  GI:  abdomen was soft, flat, nontender, nondistended, without organomegaly. Rectal exam today showed no palpable mass in the last 3cm of the rectal canal;  brown stool, heme occult negative.  Muscoloskeletal:  no spinal tenderness of palpation of vertebral spine.  Skin exam was without erythema. Patient needed assistance get on and off exam.   LABS:  Lab Results  Component Value Date   WBC 7.7 09/19/2011   HGB 9.2* 09/19/2011    HCT 28.2* 09/19/2011   PLT 267 09/19/2011   GLUCOSE 101* 09/14/2011   CHOL 114 05/21/2011   TRIG 69.0 05/21/2011   HDL 47.10 05/21/2011   LDLCALC 53 05/21/2011   ALT 6 09/13/2011   AST 19 09/13/2011   NA 137 09/14/2011   K 4.3 09/14/2011   CL 101 09/14/2011   CREATININE 1.16 09/14/2011   BUN 20 09/14/2011   CO2 23 09/14/2011   INR 1.13 09/12/2011    Ct Abdomen Pelvis W Contrast  09/13/2011  *RADIOLOGY REPORT*  Clinical Data: Unexplained abdominal pain.  CT ABDOMEN AND PELVIS WITH CONTRAST  Technique:  Multidetector CT imaging of the abdomen and pelvis was performed following the standard protocol during bolus administration of intravenous contrast.  Contrast: OMNIPAQUE IOHEXOL 300 MG/ML.  Oral contrast was not administered.  Comparison: CT abdomen and pelvis 09/27/2005.  Findings: Normal appearing liver, spleen, pancreas, and adrenal glands.  Cortical cysts in both kidneys, the largest arising from the posterior mid left kidney approximating 2.3 x 2.1 cm, increased in size since the prior examination.  2 adjacent very small (approximate 2-3 mm) calculi in a lower pole calix of the right kidney without obstruction.  No left urinary tract calculi.  No solid renal masses.  Gallbladder unremarkable by CT.  No biliary ductal dilation.  Aorto-iliofemoral atherosclerosis, with a left common iliac artery aneurysm measuring approximately 2.2 cm in diameter, minimally increased from 2.0 cm on the prior examination. Right common iliac artery ectasia up to 1.8 cm diameter, unchanged. No significant lymphadenopathy.  Stomach decompressed and unremarkable.  Normal-appearing small bowel.  Scattered diverticula involving the sigmoid colon without evidence of acute diverticulitis.  Large stool burden throughout the colon from cecum to rectum.  Appendix surgically absent.  No ascites.  Umbilical hernia containing fat, with the defect approximating 2 cm.  Mildly distended urinary bladder, with a diverticulum arising  from the left  superior bladder wall.  Prostate gland normal in size for age.  Normal seminal vesicles.  Bone window images demonstrate severe degenerative changes throughout the lower thoracic lumbar spine with prior L2-L5 PLIF and posterior decompression.  Atelectasis in the dependent portions of both lower lobes.  Heart mildly enlarged.  IMPRESSION:  1.  No acute abnormalities involving the abdomen or pelvis.  Large stool burden. 2.  Scattered sigmoid colon diverticula without evidence of acute diverticulitis. 3.  2 adjacent nonobstructing right lower pole renal calculi on the order of 2-3 mm. 4.  Umbilical hernia containing fat. 5.  Ectatic bilateral common iliac arteries, not significantly changed since 2007.  6.  Dependent atelectasis in both lower lobes. 7.  Diverticulum arising from the left superior bladder wall.  Original Report Authenticated By: Arnell Sieving, M.D.    Blood smear review:   I personally reviewed the patient's peripheral blood smear today.  There was anisocytosis.  There was no peripheral blast.  There was no schistocytosis, spherocytosis, target cell, tear drop cell.  There were rouleaux formation.  There was no giant platelets or platelet clumps.      ASSESSMENT AND PLAN:   1.  Parkinson's:  For 20 years,  Controlled with Sinemet and Amandtadine per Neurology. 2.  Hyperlipidemia:  On diet control  3.  Recent infections of the DBS unit:  S/p extraction; no active infection at this time.  4.  Chronic kidney disease, stage III:  Most likely due to chronic HTN in the past.  He does not have evidence of fluid overload today. I need to rule out myeloma due to presence of CKD, anemia, and rouleaux on blood smear.  5.  Normocytic anemia within the past few months.  - Differential diagnosis:  Anemia of inflammation due to the last few months of infections.  Another likely dx is anemia of CKD.  I need to rule out multiple myeloma.  I cannot rule out myelodysplastic syndrome given his age.   This past admission, there was no evidence of hemolysis.  There was no evidence of Vitamin B12, iron deficiency.  He did not have hypothyroidism.  There was no evidence of brisk active GI blood loss.  His retic count was not very elevated despite his level of anemia; I cannot rule out pure red cell aplasia.  - Work up:  I sent for SPEP, serum free light chain, testosterone level.  If these are negative, I will consider strongly bone marrow biopsy to rule out MDS or pure red cell aplasia. If bone marrow biopsy turns out negative, then the diagnosis of exclusion will be anemia of inflammation or from CKD.  - Treatment:  Pending work up.  He does not need pRBC transfusion today since Hgb was >7 and he was not too symptomatic from his anemia.   [facility] to recheck his Hgb.  I will see him in about 4 weeks to go over result of work up and finalize treatment plan.    Thank you for this referral.   The length of time of the face-to-face encounter was 60 minutes. More than 50% of time was spent counseling and coordination of care.

## 2011-09-19 NOTE — Patient Instructions (Addendum)
1.  Issue:  Severe anemia. - Possible cause:  Chronic kidney disease.  Need to rule out myeloma.  Need to rule out myelodysplastic syndrome.   - Work up:  Blood test from today.  I strongly recommend bone marrow biopsy to rule out primary bone marrow dysfunction.    2.  Treatment:  Depending on result of work up in the next few weeks.

## 2011-09-19 NOTE — Telephone Encounter (Signed)
gve the pt's dtr the July,aug 2013 appt calendars

## 2011-09-22 ENCOUNTER — Other Ambulatory Visit: Payer: Self-pay | Admitting: Oncology

## 2011-09-22 DIAGNOSIS — D649 Anemia, unspecified: Secondary | ICD-10-CM

## 2011-09-22 DIAGNOSIS — R5381 Other malaise: Secondary | ICD-10-CM

## 2011-09-23 LAB — BASIC METABOLIC PANEL
Calcium: 8.8 mg/dL (ref 8.4–10.5)
Glucose, Bld: 118 mg/dL — ABNORMAL HIGH (ref 70–99)
Sodium: 140 mEq/L (ref 135–145)

## 2011-09-23 LAB — LACTATE DEHYDROGENASE: LDH: 147 U/L (ref 94–250)

## 2011-09-23 LAB — PROTEIN ELECTROPHORESIS, SERUM
Alpha-1-Globulin: 5 % — ABNORMAL HIGH (ref 2.9–4.9)
Gamma Globulin: 17.8 % (ref 11.1–18.8)
Total Protein, Serum Electrophoresis: 6.9 g/dL (ref 6.0–8.3)

## 2011-09-23 LAB — ERYTHROPOIETIN: Erythropoietin: 22.5 m[IU]/mL (ref 2.6–34.0)

## 2011-09-23 LAB — KAPPA/LAMBDA LIGHT CHAINS
Kappa free light chain: 5.58 mg/dL — ABNORMAL HIGH (ref 0.33–1.94)
Lambda Free Lght Chn: 6.11 mg/dL — ABNORMAL HIGH (ref 0.57–2.63)

## 2011-09-24 ENCOUNTER — Telehealth: Payer: Self-pay | Admitting: Oncology

## 2011-09-24 NOTE — Progress Notes (Signed)
Cancelled lab appointment for 10/03/11; orders to have CBC drawn at nursing facility, per patient's wife

## 2011-09-24 NOTE — Telephone Encounter (Signed)
I personally called and talked with Mr. Rua's wife today.  I discussed with her that extensive workup for patient's anemia or positive for 2 findings.  1. Slight elevated creatinine with estimated creatinine clearance in the range of 50-70 mL per minute. 2. Hypogonadism.  I recommended her to followup with patient's PCP to see whether testosterone replacement is indicated to improve his anemia. In the future if despite replacement of testosterone and he still has severe anemia, then we may consider further workup with bone marrow biopsy to rule out bone marrow failure states such as myelodysplastic syndrome. However a bone a biopsy at this time with severe hypogonadism may be premature.

## 2011-09-25 ENCOUNTER — Telehealth: Payer: Self-pay | Admitting: *Deleted

## 2011-09-25 NOTE — Telephone Encounter (Signed)
Cancelled patient lab only appointment for 10-03-2011 per the orders from 09-23-2011

## 2011-10-03 ENCOUNTER — Other Ambulatory Visit: Payer: Medicare Other | Admitting: Lab

## 2011-10-17 ENCOUNTER — Telehealth: Payer: Self-pay | Admitting: Oncology

## 2011-10-17 NOTE — Telephone Encounter (Signed)
wife called to cx 8/12 lab/visit and said they do not wish to r/s at this time   aom

## 2011-10-20 ENCOUNTER — Ambulatory Visit: Payer: Medicare Other | Admitting: Oncology

## 2011-10-20 ENCOUNTER — Other Ambulatory Visit: Payer: Medicare Other | Admitting: Lab

## 2011-11-04 ENCOUNTER — Telehealth: Payer: Self-pay | Admitting: Oncology

## 2011-11-04 NOTE — Telephone Encounter (Signed)
Gave pt appt calendar for August 2013 lab and MD, [facility] 10/20/11

## 2011-11-06 ENCOUNTER — Other Ambulatory Visit (HOSPITAL_BASED_OUTPATIENT_CLINIC_OR_DEPARTMENT_OTHER): Payer: Medicare Other | Admitting: Lab

## 2011-11-06 ENCOUNTER — Ambulatory Visit (HOSPITAL_BASED_OUTPATIENT_CLINIC_OR_DEPARTMENT_OTHER): Payer: Medicare Other | Admitting: Oncology

## 2011-11-06 VITALS — BP 125/76 | HR 77 | Temp 97.4°F | Resp 18 | Ht 73.0 in | Wt 206.9 lb

## 2011-11-06 DIAGNOSIS — N183 Chronic kidney disease, stage 3 unspecified: Secondary | ICD-10-CM

## 2011-11-06 DIAGNOSIS — D649 Anemia, unspecified: Secondary | ICD-10-CM

## 2011-11-06 DIAGNOSIS — G20 Parkinson's disease: Secondary | ICD-10-CM

## 2011-11-06 DIAGNOSIS — G20A1 Parkinson's disease without dyskinesia, without mention of fluctuations: Secondary | ICD-10-CM

## 2011-11-06 LAB — CBC WITH DIFFERENTIAL/PLATELET
Basophils Absolute: 0 10*3/uL (ref 0.0–0.1)
Eosinophils Absolute: 0.5 10*3/uL (ref 0.0–0.5)
HCT: 34.8 % — ABNORMAL LOW (ref 38.4–49.9)
HGB: 11.4 g/dL — ABNORMAL LOW (ref 13.0–17.1)
LYMPH%: 16.2 % (ref 14.0–49.0)
MCV: 92.6 fL (ref 79.3–98.0)
MONO%: 8.3 % (ref 0.0–14.0)
NEUT#: 6.8 10*3/uL — ABNORMAL HIGH (ref 1.5–6.5)
Platelets: 215 10*3/uL (ref 140–400)

## 2011-11-06 NOTE — Patient Instructions (Addendum)
1.  Diagnosis:  Anemia of chronic inflammation and chronic renal insufficiency.    Extensive work up have been negative for iron deficiency, VitB12 deficiency, hypothyroid, hemolysis, myeloma.   I cannot rule out meylodysplastic syndrome which can only be diagnosed by a bone marrow biopsy.   However, with only intermittent anemia, a bone marrow biopsy at this time would be of low clinical utility.  2.  Recommendation:   - Follow CBC once a month (or sooner if symptomatic).  Please fax result to Dr. Gaylyn Rong at [phone]. - Return to clinic in about 6 months. - In the future, if Hgb persistently <8 or you develop pancytopenia (also low white blood cell and low platelet in addition to anemia), then I may consider a diagnostic bone marrow biopsy at that time.

## 2011-11-06 NOTE — Progress Notes (Signed)
[facility]  Telephone:(336) [phone] Fax:(336) [phone]   OFFICE PROGRESS NOTE   Cc:  [name], MD  DIAGNOSIS:  Anemia of chronic inflammation/chronic kidney disease.    CURRENT THERAPY:  Watchful observation.   INTERVAL HISTORY: [name] 78 y.o. male returns for regular follow up with his wife to go over the result of his work up.  His wife reported that he is having some problem with initiating movement from his Parkinson.  His overall stamina is pretty stable.  He denied fever, chest pain, SOB, palpitation, nausea/vomiting, visible source of bleeding, lower back pain, skin rash.   Past Medical History  Diagnosis Date  . HYPERLIPIDEMIA-MIXED 02/10/2008  . PARKINSON'S DISEASE 02/10/2008  . HYPERTENSION, BENIGN 02/09/2009  . GASTROINTESTINAL HEMORRHAGE, HX OF 02/10/2008  . ANEMIA 04/29/2010  . FIBRILLATION, ATRIAL 04/29/2010  . VENTRAL HERNIA 04/29/2010  . DEGENERATIVE JOINT DISEASE 04/29/2010  . Gastric ulcer   . Positive PPD   . Internal hemorrhoids   . Hyperplastic colon polyp 2004  . CAD (coronary artery disease)     23.0 x 24 mm Taxus DES mid LAD 2005, DCA diagonal branch 2005  . VENOUS INSUFFICIENCY 02/10/2008    rt  . Shortness of breath   . Osteomyelitis   . Calculus of kidney   . Tuberculosis     78 yrs old  . GERD (gastroesophageal reflux disease)     ulcers hx  . Infection and inflammatory reaction due to device, implant, and graft   . Neuromuscular disorder   . Sundowning     past lumbar decompressive surgery  . Chronic kidney disease (CKD), stage III (moderate)     Past Surgical History  Procedure Date  . Replacement total knee 2002    right  . Nose surgery   . Ankle surgery     left  . Toe amputation     right foot  . Cataract extraction   . Carpal tunnel release     left, open rt  . Tonsillectomy   . Appendectomy   . Coronary angioplasty with stent placement   . Back surgery 2006     back x3  . Deep brain stimulator  placement 02    batteries changed 07  . Subthalamic stimulator battery replacement 02/27/2011    Procedure: SUBTHALAMIC STIMULATOR BATTERY REPLACEMENT;  Surgeon: Dorian Heckle, MD;  Location: [facility];  Service: Neurosurgery;  Laterality: Left;  DBS battery replacment x 2 - Left Chest and Abdomen  . Eye muscle surgery     bilat  . Subthalamic stimulator removal 05/22/2011    Procedure: SUBTHALAMIC STIMULATOR REMOVAL;  Surgeon: Maeola Harman, MD;  Location: [facility];  Service: Neurosurgery;  Laterality: N/A;  removal of IPG  . Subthalamic stimulator removal 07/24/2011    Procedure: SUBTHALAMIC STIMULATOR REMOVAL;  Surgeon: Maeola Harman, MD;  Location: [facility];  Service: Neurosurgery;  Laterality: N/A;  Deep Brain stimulator hardware removal  . Foot amputation through metatarsal     right    Current Outpatient Prescriptions  Medication Sig Dispense Refill  . amantadine (SYMMETREL) 100 MG capsule Take 100 mg by mouth 2 (two) times daily.       Marland Kitchen aspirin EC 81 MG tablet Take 81 mg by mouth daily.      . carbidopa-levodopa (SINEMET IR) 25-250 MG per tablet Take 0.5 tablets by mouth at bedtime as needed. As needed for parkinson's symptoms      . Docosanol (ABREVA EX)  Apply 1 application topically 5 (five) times daily. For 1 week      . HYDROcodone-acetaminophen (NORCO) 5-325 MG per tablet Take 1-2 tablets by mouth every 6 (six) hours as needed. 1 tablet for moderate pain, 2 tablets for severe pain.      . midodrine (PROAMATINE) 5 MG tablet Take 5 mg by mouth 2 (two) times daily.       . pantoprazole (PROTONIX) 40 MG tablet Take 1 tablet (40 mg total) by mouth daily at 6 (six) AM.  30 tablet  3  . PARoxetine (PAXIL) 20 MG tablet Take 1 tablet (20 mg total) by mouth every morning.  30 tablet  11  . polyethylene glycol (MIRALAX / GLYCOLAX) packet Take 17 g by mouth daily as needed. For constipation      . pramipexole (MIRAPEX) 0.5 MG tablet Take 0.5 mg by mouth 5 (five) times daily.        Marland Kitchen pyridOXINE (VITAMIN B-6) 50 MG tablet Take 50 mg by mouth daily.      Marland Kitchen saccharomyces boulardii (FLORASTOR) 250 MG capsule Take 250 mg by mouth 2 (two) times daily. Take for 10 days      . DISCONTD: clonazePAM (KLONOPIN) 0.5 MG tablet Take 0.5 mg by mouth at bedtime as needed.        ALLERGIES:  is allergic to morphine; nitroglycerin; risperidone and related; tuberculin tests; and vancomycin.  REVIEW OF SYSTEMS:  The rest of the 14-point review of system was negative.   Filed Vitals:   11/06/11 1321  BP: 125/76  Pulse: 77  Temp: 97.4 F (36.3 C)  Resp: 18   Wt Readings from Last 3 Encounters:  11/06/11 206 lb 14.4 oz (93.849 kg)  09/19/11 202 lb 3.2 oz (91.717 kg)  09/12/11 206 lb (93.441 kg)   ECOG Performance status: 1-2 due to Parkinson.   PHYSICAL EXAMINATION:   General: Well-nourished man, in no acute distress. Eyes: no scleral icterus. ENT: There were no oropharyngeal lesions. Neck was without thyromegaly. Lymphatics: Negative cervical, supraclavicular or axillary adenopathy. Respiratory: lungs were clear bilaterally without wheezing or crackles. Cardiovascular: Regular rate and rhythm on today exam, S1/S2, without murmur, rub or gallop. There was no pedal edema. GI: abdomen was soft, flat, nontender, nondistended, without organomegaly. Rectal exam today showed no palpable mass in the last 3cm of the rectal canal; brown stool, heme occult negative. Muscoloskeletal: no spinal tenderness of palpation of vertebral spine. Skin exam was without erythema. Patient was wheelchair bound since exam table was too tall for him.   LABORATORY/RADIOLOGY DATA:  Lab Results  Component Value Date   WBC 9.6 11/06/2011   HGB 11.4* 11/06/2011   HCT 34.8* 11/06/2011   PLT 215 11/06/2011   GLUCOSE 118* 09/19/2011   CHOL 114 05/21/2011   TRIG 69.0 05/21/2011   HDL 47.10 05/21/2011   LDLCALC 53 05/21/2011   ALKPHOS 90 09/13/2011   ALT 6 09/13/2011   AST 19 09/13/2011   NA 140 09/19/2011   K 4.1  09/19/2011   CL 108 09/19/2011   CREATININE 1.31 09/19/2011   BUN 30* 09/19/2011   CO2 23 09/19/2011   INR 1.13 09/12/2011    ASSESSMENT AND PLAN:    1. Parkinson's: For 20 years, Controlled with Sinemet and Amandtadine per Neurology.   His anemia should not interfere with Dr. Brynda Rim plan for another neuro stimulating unit.    2. Hyperlipidemia: On diet control   3. Recent infections of the DBS unit: S/p extraction; no active  infection at this time.   4. Chronic kidney disease, stage III: Most likely due to chronic HTN in the past. He does not have evidence of fluid overload today. I need to rule out myeloma due to presence of CKD, anemia, and rouleaux on blood smear.   5. Normocytic anemia within the past few months.  -  Diagnosis:  Anemia of chronic inflammation and chronic renal insufficiency.    Extensive work up have been negative for iron deficiency, VitB12 deficiency, hypothyroid, hemolysis, myeloma.   I cannot rule out meylodysplastic syndrome which can only be diagnosed by a bone marrow biopsy.   However, with only intermittent anemia, a bone marrow biopsy at this time would be of low clinical utility.   I cannot explain why his Hgb decreased to 7.7 a few days ago and now much better without treatment.  Mrs. Germano, his wife agreed with plan for observation without bone marrow biopsy. - Recommendation:   Follow CBC twice a month (or sooner if symptomatic).  I will space out  Please fax result to Dr. Gaylyn Rong at [phone].  Return to clinic in about 6 months.  If his Hgb continues to drop persistently, I may consider Aranesp injection which can in 50% improve Hgb and stamina and decrease the need for frequent blood transfusion.  However, Mr. Hari has not required frequent blood transfusion.  With the slightly increased risk of CVA and CAD with Aranesp, I recommend holding off on Aranesp at this time.  In the future, if Hgb persistently <8 or patient develops pancytopenia (also low white blood  cell and low platelet in addition to anemia), then I may consider a diagnostic bone marrow biopsy at that time.   The length of time of the face-to-face encounter was 15 minutes. More than 50% of time was spent counseling and coordination of care.

## 2011-11-07 ENCOUNTER — Telehealth: Payer: Self-pay | Admitting: Oncology

## 2011-11-07 NOTE — Telephone Encounter (Signed)
S/w the pt's wife and she is aware of the feb 2013 appts °

## 2012-01-09 IMAGING — CT CT HEAD W/O CM
2 series · 16 of 30 positions shown, 18 images · non-contrast
Comparison: CT brain scan of 12/05/2008

CLINICAL DATA: Fell on 08/21/2009 with increased weakness infusion

CT HEAD WITHOUT CONTRAST
TECHNIQUE: Contiguous axial images were obtained from the base of
the skull through the vertex without contrast.

[Series 3: head bone · axial · 0.49mm/px · z∈[+14,+141]mm · 8 of 64 slices shown]
[im 7/64  bone]
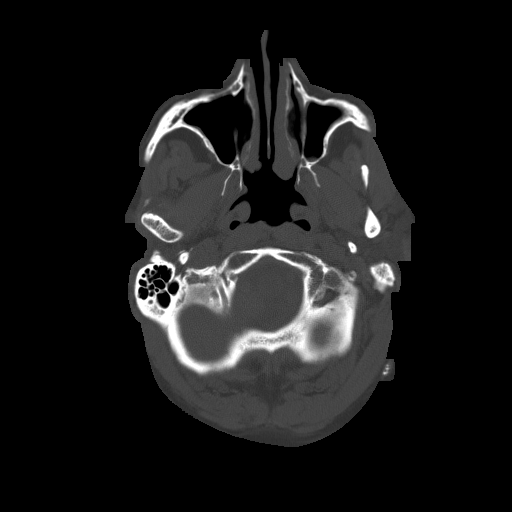
[im 14/64  bone]
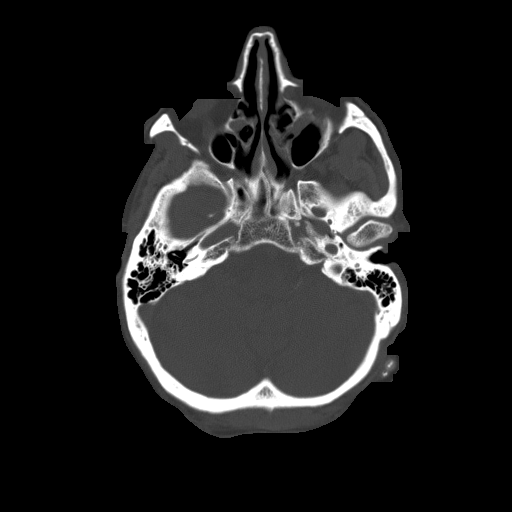
[im 20/64  bone]
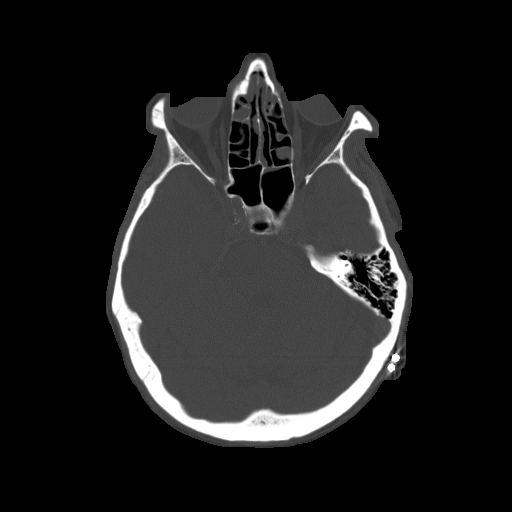
[im 27/64  bone]
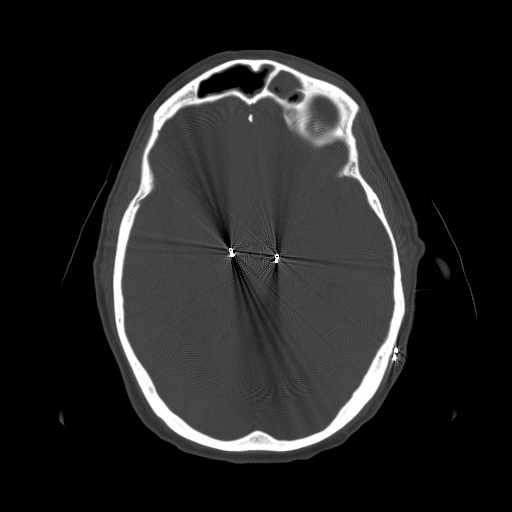
[im 37/64  bone]
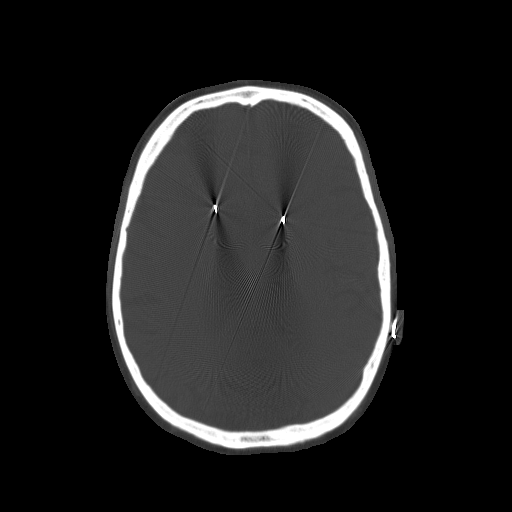
[im 44/64  bone]
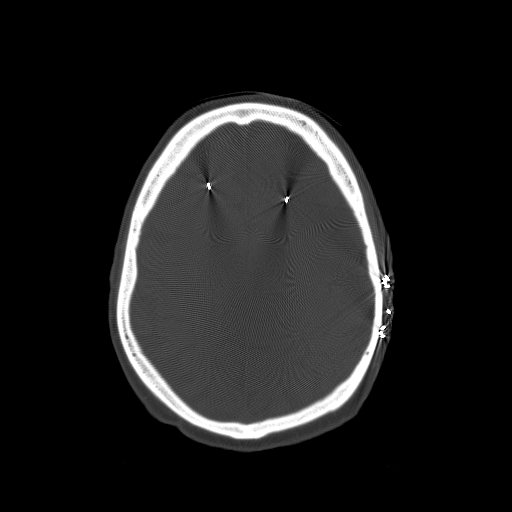
[im 50/64  bone]
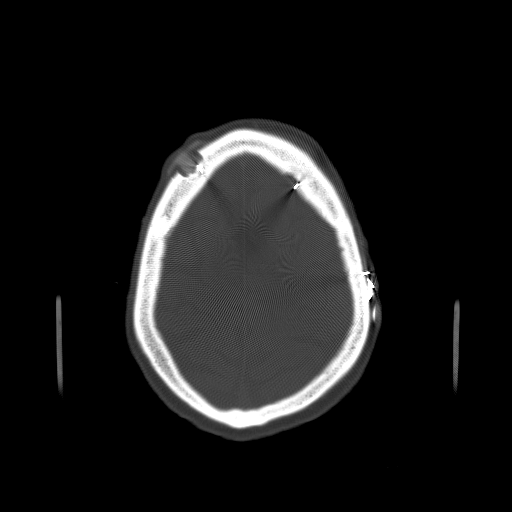
[im 57/64  bone]
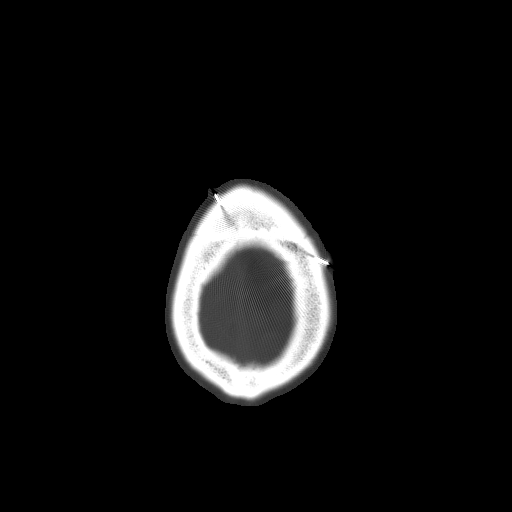

[Series 32: 3d filtered head w/o · axial · non-contrast · 0.49mm/px · z∈[+16,+137]mm · 8 of 32 slices shown, 10 images]
[im 4/32  brain]
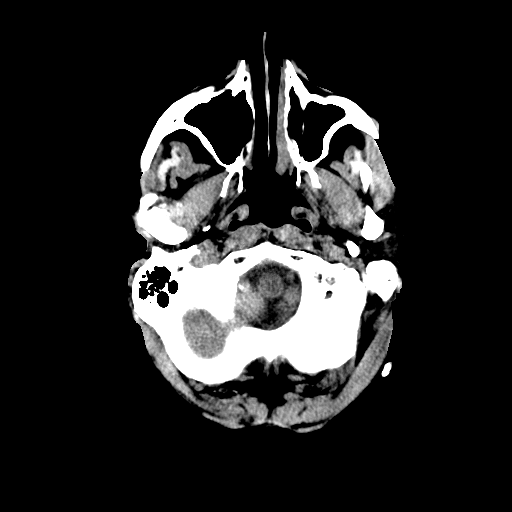
[im 4/32  bone]
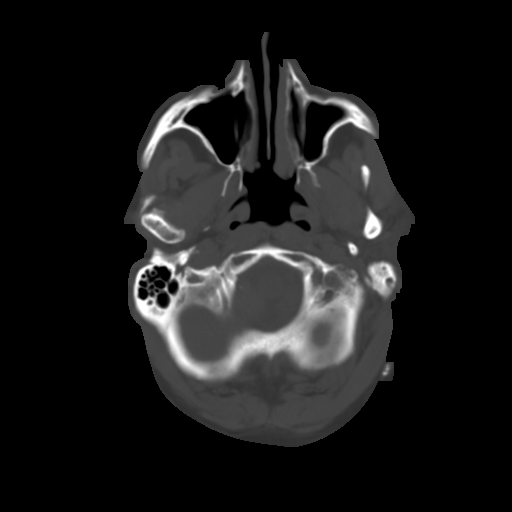
[im 7/32  brain]
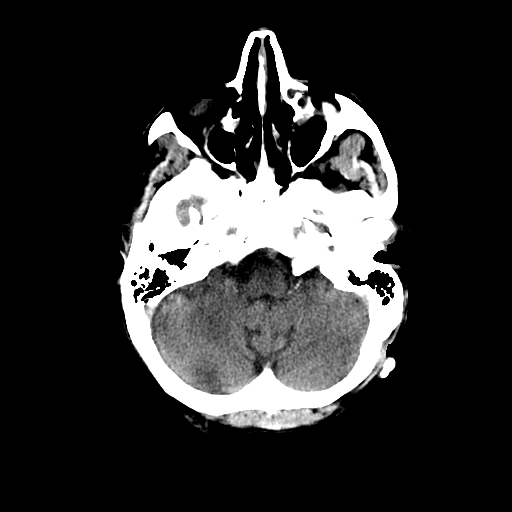
[im 11/32  brain]
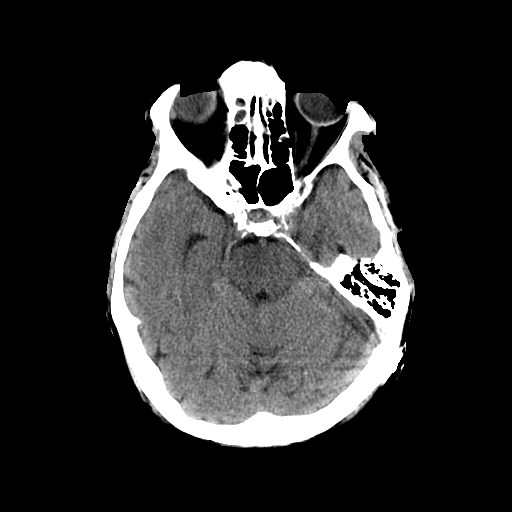
[im 14/32  brain]
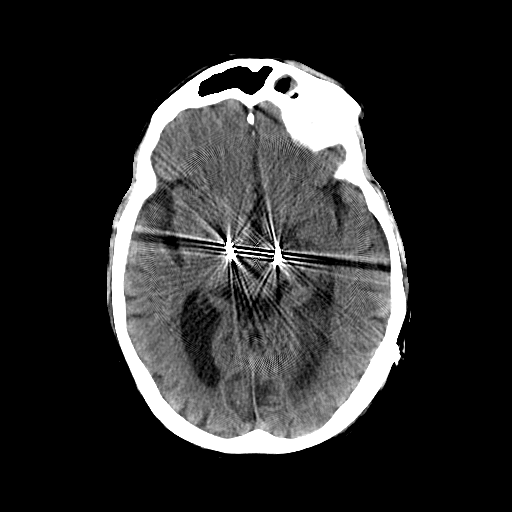
[im 18/32  brain]
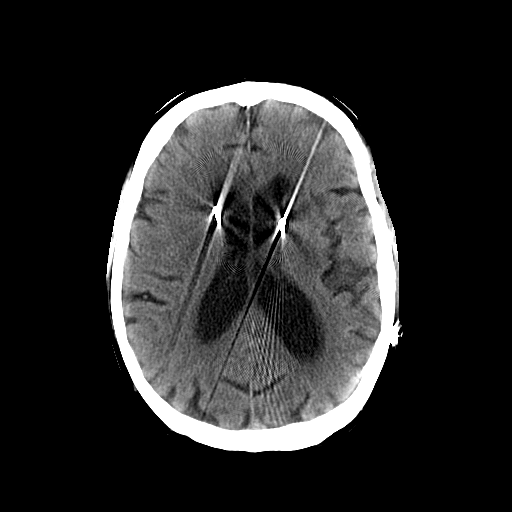
[im 18/32  bone]
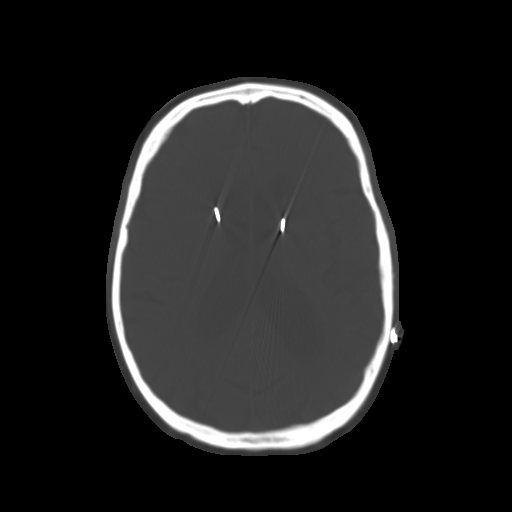
[im 21/32  brain]
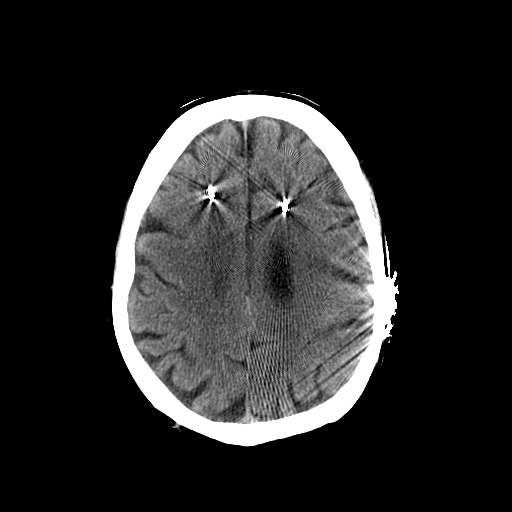
[im 25/32  brain]
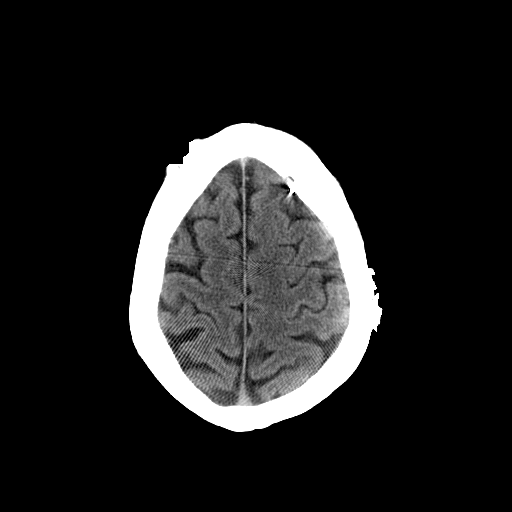
[im 28/32  brain]
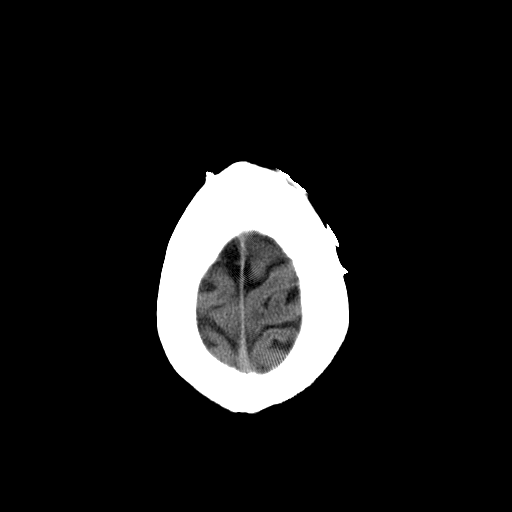

[16 of 30 positions shown; findings below may reference images not displayed]

FINDINGS: The ventricular system it is diffusely prominent as are
the cortical sulci, indicative of diffuse atrophy.  As noted
previously bithalamic electrode stimulators remain unchanged in
position.  No acute blood, mass effect, or infarction is seen.
There is opacification of the ethmoid sinuses and the left frontal
sinus with some mucosal thickening in the right frontal sinus
consistent with ethmoid and frontal sinusitis.  No bony abnormality
is seen.
IMPRESSION: 1.  Stable CT of the brain with no acute abnormality.  Diffuse
atrophy.
2.  Frontal and ethmoid sinusitis.

## 2012-01-26 IMAGING — CT CT HEAD W/O CM
2 series · 16 of 30 positions shown, 18 images · non-contrast
Comparison: [HOSPITAL] at [REDACTED] [HOSPITAL] head CT
08/28/2009.

CLINICAL DATA: Fall injury 09/12/2009 with forehead contusion,
bruising left eye, history Parkinson's disease.

CT HEAD WITHOUT CONTRAST
TECHNIQUE: Contiguous axial images were obtained from the base of
the skull through the vertex without contrast.

[Series 3: head bone · axial · 0.49mm/px · z∈[-0,+125]mm · 8 of 64 slices shown]
[im 7/64  bone]
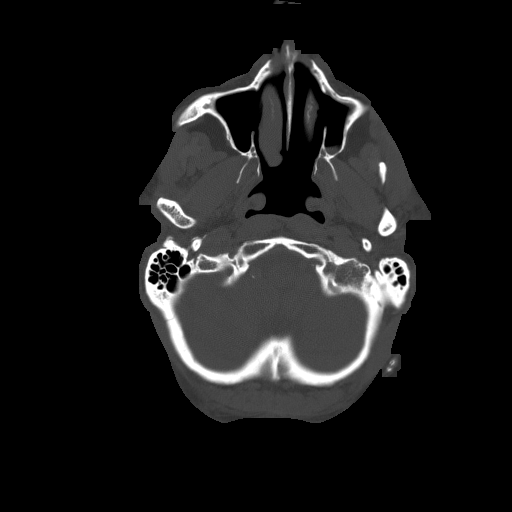
[im 14/64  bone]
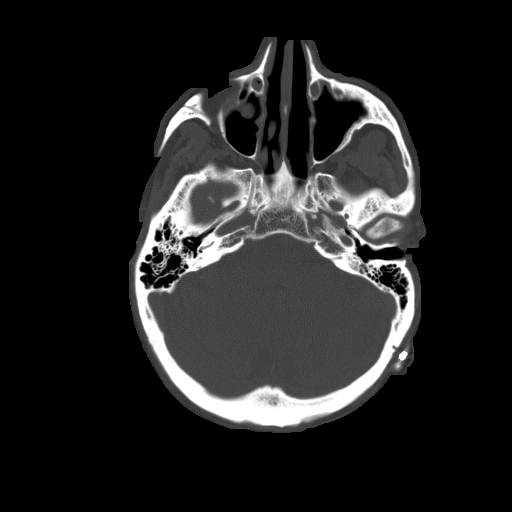
[im 20/64  bone]
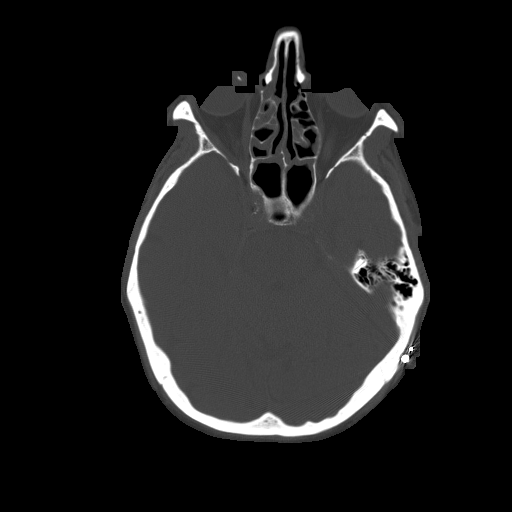
[im 27/64  bone]
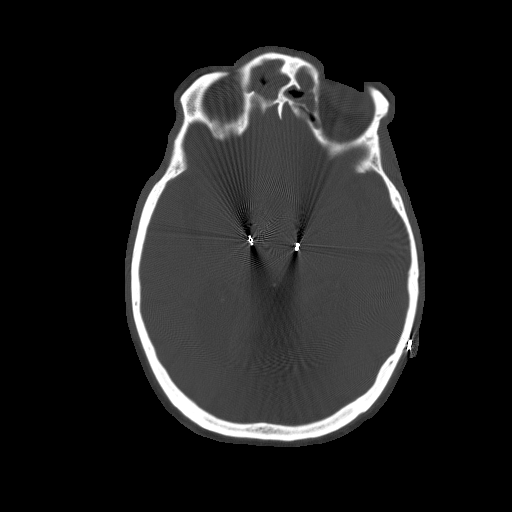
[im 37/64  bone]
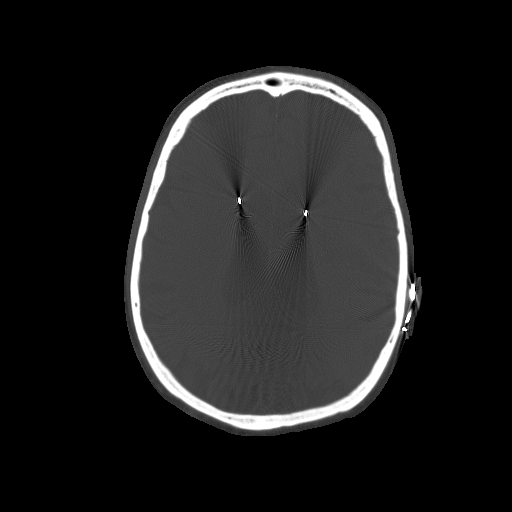
[im 44/64  bone]
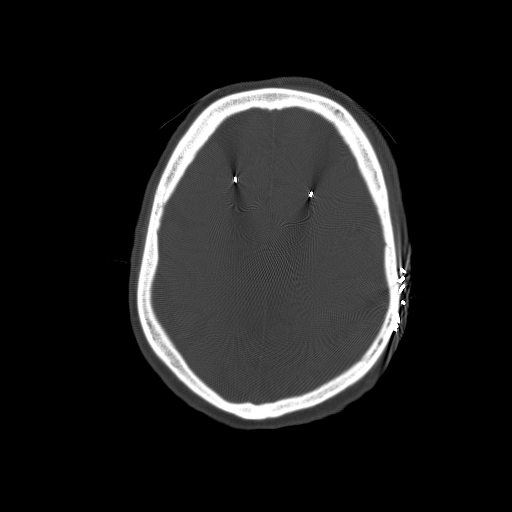
[im 50/64  bone]
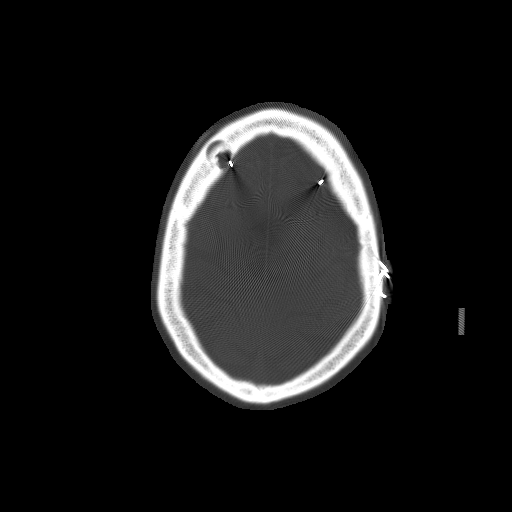
[im 57/64  bone]
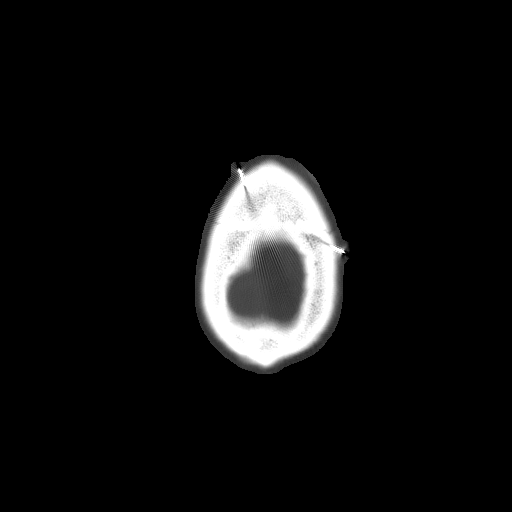

[Series 32: 3d filtered head w/o · axial · non-contrast · 0.49mm/px · z∈[+1,+121]mm · 8 of 32 slices shown, 10 images]
[im 4/32  brain]
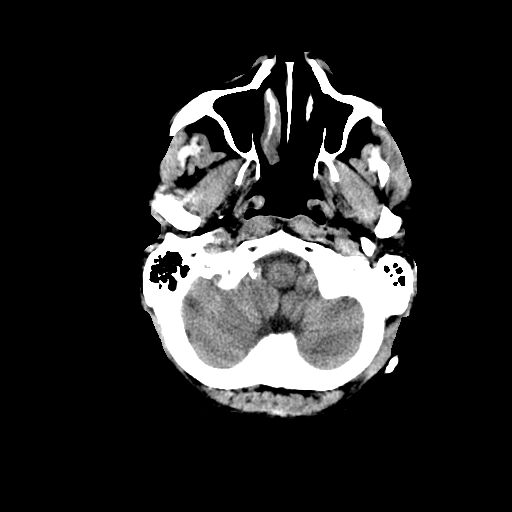
[im 4/32  bone]
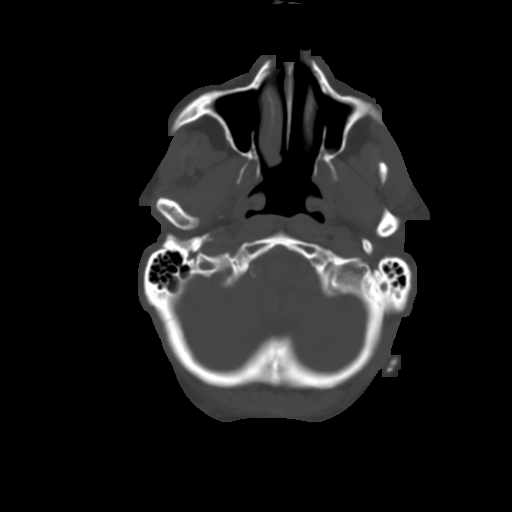
[im 7/32  brain]
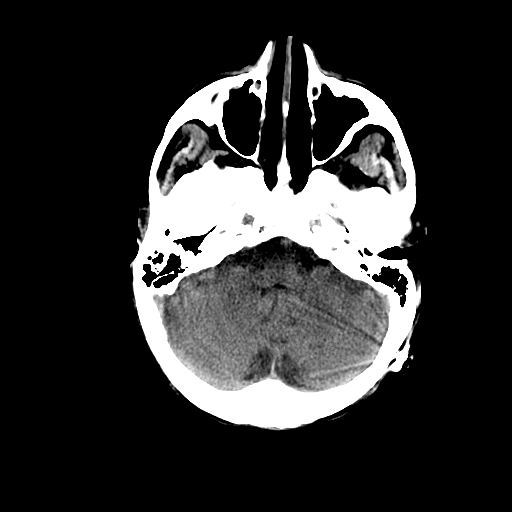
[im 11/32  brain]
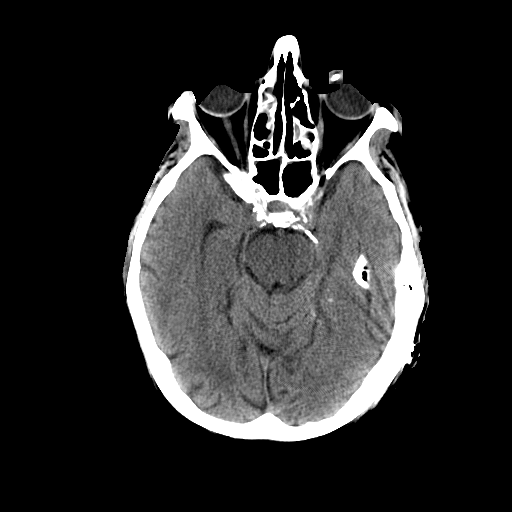
[im 14/32  brain]
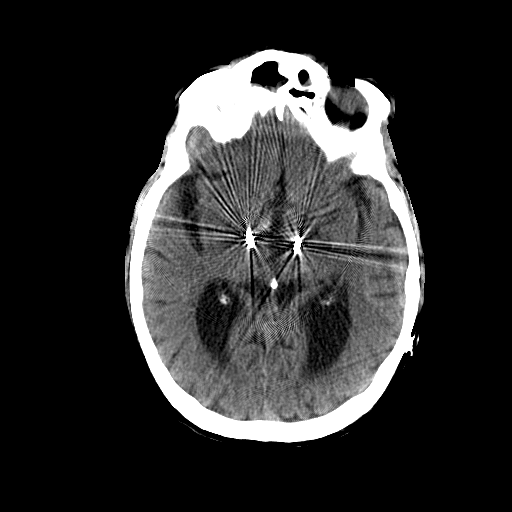
[im 18/32  brain]
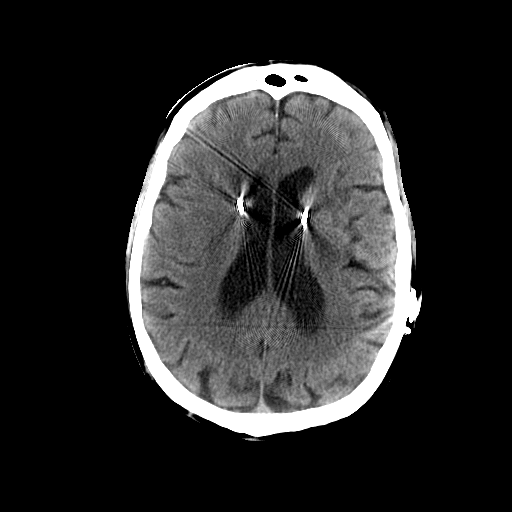
[im 18/32  bone]
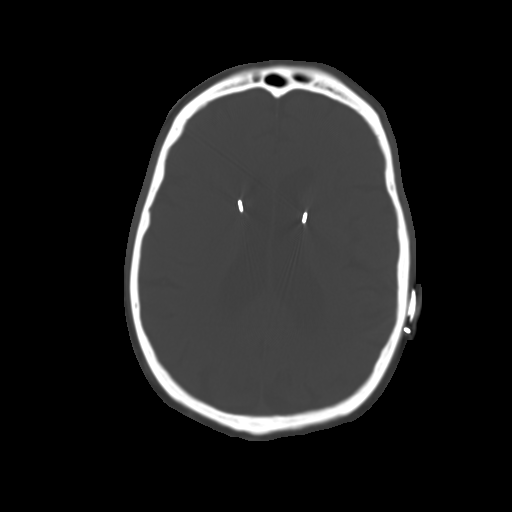
[im 21/32  brain]
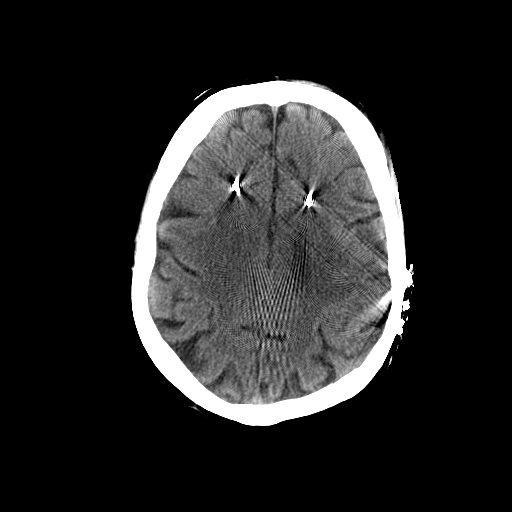
[im 25/32  brain]
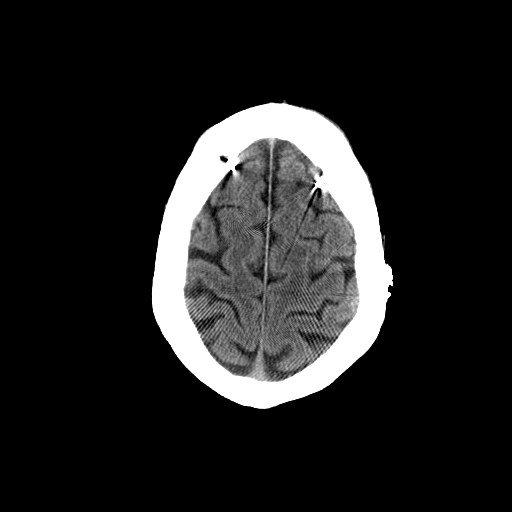
[im 28/32  brain]
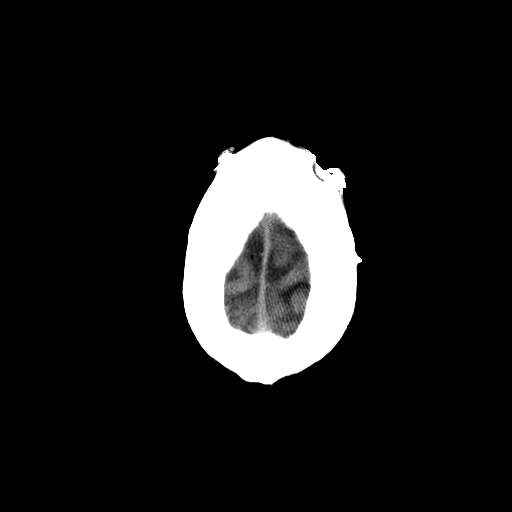

[16 of 30 positions shown; findings below may reference images not displayed]

FINDINGS: Interval slight left superior frontal scalp soft tissue
swelling is seen with no significant hematoma.  Stable deep brain
stimulator apparatus visualized with no interval skull fracture.
Essentially stable slight to moderate chronic pansinusitis findings
seen with clear bilateral mastoid air cells.  Stable slight age
related cerebral atrophy with no interval acute stroke/cerebral
edema, acute intracerebral hemorrhage, tumor, mass lesion, acute
extra-axial hemorrhage/tumor/collection is seen.  Brainstem and
cerebellum are stable.
IMPRESSION: 1.  Interval slight left superior frontal scalp soft tissue
swelling with no fracture.
2.  Stable slight to moderate chronic pansinusitis.
3.  Stable deep brain stimulator and slight diffuse age related
cerebral atrophy.
4.  No interval acute intracranial abnormality.

## 2012-03-08 ENCOUNTER — Other Ambulatory Visit: Payer: Self-pay | Admitting: Dermatology

## 2012-03-08 ENCOUNTER — Ambulatory Visit (INDEPENDENT_AMBULATORY_CARE_PROVIDER_SITE_OTHER): Admitting: General Surgery

## 2012-03-08 ENCOUNTER — Encounter (INDEPENDENT_AMBULATORY_CARE_PROVIDER_SITE_OTHER): Payer: Self-pay | Admitting: General Surgery

## 2012-03-08 ENCOUNTER — Ambulatory Visit (INDEPENDENT_AMBULATORY_CARE_PROVIDER_SITE_OTHER): Payer: Self-pay | Admitting: General Surgery

## 2012-03-08 VITALS — BP 112/64 | HR 74 | Temp 98.0°F | Resp 16 | Ht 72.0 in | Wt 216.0 lb

## 2012-03-08 DIAGNOSIS — K432 Incisional hernia without obstruction or gangrene: Secondary | ICD-10-CM

## 2012-03-08 NOTE — Patient Instructions (Signed)
Call if the hernia becomes significantly larger or significantly uncomfortable.

## 2012-03-08 NOTE — Progress Notes (Signed)
Patient ID: [name], male   DOB: 1929/12/09, 78 y.o.   MRN: [id number]  No chief complaint on file.   HPI [name] is a 78 y.o. male.   HPI  He is referred by Dr. Otelia Sergeant for evaluation of a periumbilical ventral hernia.  He underwent an exploratory laparotomy for appendicitis many years ago. He has a long-standing ventral incisional hernia in the periumbilical region that is asymptomatic. It has been getting a little larger over the years. No obstructive-type symptoms.  He is here with his wife.  Past Medical History  Diagnosis Date  . HYPERLIPIDEMIA-MIXED 02/10/2008  . PARKINSON'S DISEASE 02/10/2008  . HYPERTENSION, BENIGN 02/09/2009  . GASTROINTESTINAL HEMORRHAGE, HX OF 02/10/2008  . ANEMIA 04/29/2010  . FIBRILLATION, ATRIAL 04/29/2010  . VENTRAL HERNIA 04/29/2010  . DEGENERATIVE JOINT DISEASE 04/29/2010  . Gastric ulcer   . Positive PPD   . Internal hemorrhoids   . Hyperplastic colon polyp 2004  . CAD (coronary artery disease)     23.0 x 24 mm Taxus DES mid LAD 2005, DCA diagonal branch 2005  . VENOUS INSUFFICIENCY 02/10/2008    rt  . Shortness of breath   . Osteomyelitis   . Calculus of kidney   . Tuberculosis     78 yrs old  . GERD (gastroesophageal reflux disease)     ulcers hx  . Infection and inflammatory reaction due to device, implant, and graft   . Neuromuscular disorder   . Sundowning     past lumbar decompressive surgery  . Chronic kidney disease (CKD), stage III (moderate)   . Blood transfusion without reported diagnosis     Past Surgical History  Procedure Date  . Replacement total knee 2002    right  . Nose surgery   . Ankle surgery     left  . Toe amputation     right foot  . Cataract extraction   . Carpal tunnel release     left, open rt  . Tonsillectomy   . Appendectomy   . Coronary angioplasty with stent placement   . Back surgery 2006     back x3  . Deep brain stimulator placement 02    batteries changed 07  . Subthalamic stimulator  battery replacement 02/27/2011    Procedure: SUBTHALAMIC STIMULATOR BATTERY REPLACEMENT;  Surgeon: Dorian Heckle, MD;  Location: [facility];  Service: Neurosurgery;  Laterality: Left;  DBS battery replacment x 2 - Left Chest and Abdomen  . Eye muscle surgery     bilat  . Subthalamic stimulator removal 05/22/2011    Procedure: SUBTHALAMIC STIMULATOR REMOVAL;  Surgeon: Maeola Harman, MD;  Location: [facility];  Service: Neurosurgery;  Laterality: N/A;  removal of IPG  . Subthalamic stimulator removal 07/24/2011    Procedure: SUBTHALAMIC STIMULATOR REMOVAL;  Surgeon: Maeola Harman, MD;  Location: [facility];  Service: Neurosurgery;  Laterality: N/A;  Deep Brain stimulator hardware removal  . Foot amputation through metatarsal     right    Family History  Problem Relation Age of Onset  . Parkinsonism Sister   . Diabetes Brother   . Crohn's disease Daughter   . Heart disease Paternal Grandfather   . Colon cancer Neg Hx     Social History History  Substance Use Topics  . Smoking status: Never Smoker   . Smokeless tobacco: Never Used  . Alcohol Use: No    Allergies  Allergen Reactions  . Morphine Other (See Comments)    unknown  .  Nitroglycerin Other (See Comments)    unknown  . Risperidone And Related Other (See Comments)    unknown  . Tuberculin Tests Other (See Comments)    unknown  . Vancomycin Rash    Current Outpatient Prescriptions  Medication Sig Dispense Refill  . acetaminophen (TYLENOL) 325 MG tablet Take 650 mg by mouth every 4 (four) hours as needed.      Marland Kitchen amantadine (SYMMETREL) 100 MG capsule Take 100 mg by mouth 2 (two) times daily.       Marland Kitchen aspirin EC 81 MG tablet Take 81 mg by mouth daily.      . carbidopa-levodopa (SINEMET IR) 25-250 MG per tablet Take 0.5 tablets by mouth at bedtime as needed. As needed for parkinson's symptoms      . Docosanol (ABREVA EX) Apply 1 application topically 5 (five) times daily. For 1 week      . HYDROcodone-acetaminophen  (NORCO) 5-325 MG per tablet Take 1-2 tablets by mouth every 6 (six) hours as needed. 1 tablet for moderate pain, 2 tablets for severe pain.      Marland Kitchen ipratropium (ATROVENT) 0.02 % nebulizer solution Take 500 mcg by nebulization 4 (four) times daily. For shortness of breath      . midodrine (PROAMATINE) 5 MG tablet Take 5 mg by mouth 2 (two) times daily.       . pantoprazole (PROTONIX) 40 MG tablet Take 1 tablet (40 mg total) by mouth daily at 6 (six) AM.  30 tablet  3  . PARoxetine (PAXIL) 20 MG tablet Take 1 tablet (20 mg total) by mouth every morning.  30 tablet  11  . polyethylene glycol (MIRALAX / GLYCOLAX) packet Take 17 g by mouth daily as needed. For constipation      . pramipexole (MIRAPEX) 0.5 MG tablet Take 0.5 mg by mouth 5 (five) times daily.      Marland Kitchen pyridOXINE (VITAMIN B-6) 50 MG tablet Take 50 mg by mouth daily.      Marland Kitchen saccharomyces boulardii (FLORASTOR) 250 MG capsule Take 250 mg by mouth 2 (two) times daily. Take for 10 days      . [DISCONTINUED] clonazePAM (KLONOPIN) 0.5 MG tablet Take 0.5 mg by mouth at bedtime as needed.        Review of Systems Review of Systems  Constitutional: Negative for fever.  Eyes: Positive for visual disturbance (double vision).  Gastrointestinal: Negative.   Neurological: Positive for tremors.    Blood pressure 112/64, pulse 74, temperature 98 F (36.7 C), resp. rate 16, height 6' (1.829 m), weight 216 lb (97.977 kg).  Physical Exam Physical Exam  Constitutional:       Elderly male in no acute distress.  Abdominal: Soft. He exhibits no distension and no mass. There is no tenderness.       Midline incision with periumbilical bulge that is reducible. There is a palpable 2-2.5 cm fascial defect. This is nontender.  Neurological:       Upper extremity as well as head tremor noted  Skin: Skin is warm and dry.    Data Reviewed Notes in EPIC.Marland Kitchen Notes from Dr. Otelia Sergeant.  Assessment    Reducible long-standing ventral incisional hernia. No evidence  of incarceration. Has multiple significant co-morbidities.    Plan    We discussed repair but neither he nor his wife are really interested in doing that now unless his situation gets worse or its potential urgent matter.  They were interested in having a point of contact in the practice  just in case something needed to be done.  I described incarceration symptoms to them. It starts getting larger or becoming a lot more uncomfortable I told him to come back and we could discuss repair at that time.       Taahir Grisby J 03/08/2012, 5:42 PM

## 2012-03-15 ENCOUNTER — Encounter: Payer: Self-pay | Admitting: Neurology

## 2012-03-22 ENCOUNTER — Encounter: Payer: Self-pay | Admitting: Cardiovascular Disease

## 2012-03-22 ENCOUNTER — Ambulatory Visit (INDEPENDENT_AMBULATORY_CARE_PROVIDER_SITE_OTHER): Payer: Medicare Other | Admitting: Cardiovascular Disease

## 2012-03-22 VITALS — BP 130/80 | HR 70 | Ht 73.0 in | Wt 219.0 lb

## 2012-03-22 DIAGNOSIS — I2510 Atherosclerotic heart disease of native coronary artery without angina pectoris: Secondary | ICD-10-CM

## 2012-03-22 NOTE — Patient Instructions (Addendum)
Your physician wants you to follow-up in:  12 months.  You will receive a reminder letter in the mail two months in advance. If you don't receive a letter, please call our office to schedule the follow-up appointment.   

## 2012-03-22 NOTE — Progress Notes (Signed)
History of Present Illness: 79 yo WM with history of CAD, Parkinsons, GI bleeding, HLD, HTN and paroxysmal atrial fibrillation here today for cardiac follow up. He has been followed in the past by Dr. Juanda Chance. 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 He had a Taxus drug eluting stent placed in the mid LAD in 2005 and had an atherectomy procedure with [facility] of the diagonal branch at the same time per Dr. Juanda Chance. He's done well from this since that time with no chest pain or palpitations. His past history is also significant for Parkinson's and he has a brain stimulator for this and is followed by Dr. Sandria Manly. He also has had toe amputations for osteomyelitis. He has a history of a GI bleed and had to stop his Plavix for this. In July 2011 he had a lumbar laminectomy for spinal stenosis and was in rehabilitation and then a skilled nursing facility after that. He had some postural hypotension then and was put on midodrine. I ordered an echo which was overall normal on 03/24/11. His deep brain stimulator has been removed secondary to battery infection.  He is here for follow up. He has been doing well. He has had some symptoms of intermittent shortness of breath not related to exertion. This is at baseline and mostly when he is anxious. He's had no chest pain or palpitations. He is on Hospice now.   Primary Care Physician: Evelena Peat  Last Lipid Profile:Lipid Panel     Component Value Date/Time   CHOL 114 05/21/2011 0956   TRIG 69.0 05/21/2011 0956   HDL 47.10 05/21/2011 0956   CHOLHDL 2 05/21/2011 0956   VLDL 13.8 05/21/2011 0956   LDLCALC 53 05/21/2011 0956     Past Medical History  Diagnosis Date  . HYPERLIPIDEMIA-MIXED 02/10/2008  . PARKINSON'S DISEASE 02/10/2008  . HYPERTENSION, BENIGN 02/09/2009  . GASTROINTESTINAL HEMORRHAGE, HX OF 02/10/2008  . ANEMIA 04/29/2010  . FIBRILLATION, ATRIAL 04/29/2010  . VENTRAL HERNIA 04/29/2010  . DEGENERATIVE JOINT DISEASE 04/29/2010  . Gastric ulcer   .  Positive PPD   . Internal hemorrhoids   . Hyperplastic colon polyp 2004  . CAD (coronary artery disease)     23.0 x 24 mm Taxus DES mid LAD 2005, DCA diagonal branch 2005  . VENOUS INSUFFICIENCY 02/10/2008    rt  . Shortness of breath   . Osteomyelitis   . Calculus of kidney   . Tuberculosis     79 yrs old  . GERD (gastroesophageal reflux disease)     ulcers hx  . Infection and inflammatory reaction due to device, implant, and graft   . Neuromuscular disorder   . Sundowning     past lumbar decompressive surgery  . Chronic kidney disease (CKD), stage III (moderate)   . Blood transfusion without reported diagnosis     Past Surgical History  Procedure Date  . Replacement total knee 2002    right  . Nose surgery   . Ankle surgery     left  . Toe amputation     right foot  . Cataract extraction   . Carpal tunnel release     left, open rt  . Tonsillectomy   . Appendectomy   . Coronary angioplasty with stent placement   . Back surgery 2006     back x3  . Deep brain stimulator placement 02    batteries changed 07  . Subthalamic stimulator battery replacement 02/27/2011    Procedure: SUBTHALAMIC STIMULATOR BATTERY REPLACEMENT;  Surgeon: Dorian Heckle, MD;  Location: [facility];  Service: Neurosurgery;  Laterality: Left;  DBS battery replacment x 2 - Left Chest and Abdomen  . Eye muscle surgery     bilat  . Subthalamic stimulator removal 05/22/2011    Procedure: SUBTHALAMIC STIMULATOR REMOVAL;  Surgeon: Maeola Harman, MD;  Location: [facility];  Service: Neurosurgery;  Laterality: N/A;  removal of IPG  . Subthalamic stimulator removal 07/24/2011    Procedure: SUBTHALAMIC STIMULATOR REMOVAL;  Surgeon: Maeola Harman, MD;  Location: [facility];  Service: Neurosurgery;  Laterality: N/A;  Deep Brain stimulator hardware removal  . Foot amputation through metatarsal     right    Current Outpatient Prescriptions  Medication Sig Dispense Refill  . acetaminophen (TYLENOL) 325 MG  tablet TAKE 1 TABLET IN THE EVENING DAILY      . amantadine (SYMMETREL) 100 MG capsule Take 100 mg by mouth 2 (two) times daily.       Marland Kitchen aspirin EC 81 MG tablet Take 81 mg by mouth daily.      . carbidopa-levodopa (SINEMET IR) 25-250 MG per tablet Take 0.5 tablets by mouth at bedtime as needed. As needed for parkinson's symptoms      . Docosanol (ABREVA EX) Apply 1 application topically 5 (five) times daily. For 1 week      . HYDROcodone-acetaminophen (NORCO) 5-325 MG per tablet Take 1-2 tablets by mouth every 6 (six) hours as needed. 1 tablet for moderate pain, 2 tablets for severe pain.      . midodrine (PROAMATINE) 5 MG tablet Take 5 mg by mouth 2 (two) times daily.       . pantoprazole (PROTONIX) 40 MG tablet Take 1 tablet (40 mg total) by mouth daily at 6 (six) AM.  30 tablet  3  . PARoxetine (PAXIL) 20 MG tablet Take 1 tablet (20 mg total) by mouth every morning.  30 tablet  11  . polyethylene glycol (MIRALAX / GLYCOLAX) packet Take 17 g by mouth daily as needed. For constipation      . pramipexole (MIRAPEX) 0.5 MG tablet Take 0.5 mg by mouth 5 (five) times daily.      Marland Kitchen pyridOXINE (VITAMIN B-6) 50 MG tablet Take 50 mg by mouth daily.      Marland Kitchen saccharomyces boulardii (FLORASTOR) 250 MG capsule Take 250 mg by mouth 2 (two) times daily. Take for 10 days      . ipratropium (ATROVENT) 0.02 % nebulizer solution Take 500 mcg by nebulization 4 (four) times daily. For shortness of breath      . zolpidem (AMBIEN) 5 MG tablet TAKE 1 TABLET A DAY AT BEDTIME      . [DISCONTINUED] clonazePAM (KLONOPIN) 0.5 MG tablet Take 0.5 mg by mouth at bedtime as needed.        Allergies  Allergen Reactions  . Morphine Other (See Comments)    unknown  . Nitroglycerin Other (See Comments)    unknown  . Risperidone And Related Other (See Comments)    unknown  . Tuberculin Tests Other (See Comments)    unknown  . Vancomycin Rash    History   Social History  . Marital Status: Married    Spouse Name: N/A     Number of Children: 4  . Years of Education: N/A   Occupational History  . retired Building surveyor professor    Social History Main Topics  . Smoking status: Never Smoker   . Smokeless tobacco: Never Used  . Alcohol  Use: No  . Drug Use: No  . Sexually Active: No   Other Topics Concern  . Not on file   Social History Narrative  . No narrative on file    Family History  Problem Relation Age of Onset  . Parkinsonism Sister   . Diabetes Brother   . Crohn's disease Daughter   . Heart disease Paternal Grandfather   . Colon cancer Neg Hx     Review of Systems:  As stated in the HPI and otherwise negative.   BP 130/80  Pulse 70  Ht 6\' 1"  (1.854 m)  Wt 219 lb (99.338 kg)  BMI 28.89 kg/m2  SpO2 98%  Physical Examination: General: Well developed, well nourished, NAD HEENT: OP clear, mucus membranes moist SKIN: warm, dry. No rashes. Neuro: No focal deficits Musculoskeletal: Muscle strength 5/5 all ext Psychiatric: Mood and affect normal Neck: No JVD, no carotid bruits, no thyromegaly, no lymphadenopathy. Lungs:Clear bilaterally, no wheezes, rhonci, crackles Cardiovascular: Regular rate and rhythm. No murmurs, gallops or rubs. Abdomen:Soft. Bowel sounds present. Non-tender.  Extremities: No lower extremity edema. Pulses are 2 + in the bilateral DP/PT.  Assessment and Plan:   1. CAD: Stable. Continue ASA. He is on Hospice. No other cardiac meds.   2. HTN: BP well controlled. No changes.

## 2012-03-31 ENCOUNTER — Ambulatory Visit (INDEPENDENT_AMBULATORY_CARE_PROVIDER_SITE_OTHER): Payer: Medicare Other | Admitting: Neurology

## 2012-03-31 ENCOUNTER — Encounter: Payer: Self-pay | Admitting: Neurology

## 2012-03-31 VITALS — BP 132/80 | HR 72 | Temp 97.5°F | Resp 18

## 2012-03-31 DIAGNOSIS — G20 Parkinson's disease: Secondary | ICD-10-CM

## 2012-03-31 DIAGNOSIS — H5316 Psychophysical visual disturbances: Secondary | ICD-10-CM

## 2012-03-31 DIAGNOSIS — K117 Disturbances of salivary secretion: Secondary | ICD-10-CM

## 2012-03-31 DIAGNOSIS — I951 Orthostatic hypotension: Secondary | ICD-10-CM

## 2012-03-31 DIAGNOSIS — R441 Visual hallucinations: Secondary | ICD-10-CM

## 2012-03-31 NOTE — Patient Instructions (Addendum)
Follow up with Dr. Arbutus Leas as needed.

## 2012-03-31 NOTE — Progress Notes (Signed)
[name] was seen today in the movement disorders clinic for neurologic consultation at the request of Dr. Venetia Maxon.  The consultation is for the evaluation of PD and evaluation for DBS.  Dr. Rush Farmer notes were reviewed.   This patient is accompanied in the office by his spouse who supplements the history.  The paitnet was dx with PD in 1987.  The patient states that he was overseas in [place] and when he came home, he noted that his L arm wouldn't swing.  He was referred to a neurologist in [place], [place] and was dx with PD.  No medications were initially prescribed.  He was part of the DataTop study at the [facility] [place], and he believes that he got the selegeline, Vit E.  He is not sure why he went off of it.  They cannot remember in what order he started his anti-Parkinson's medications.  In 1993, he had moved to [place] and DBS was eventually recommended.  The surgery was done in [place], [place] in 2002.  He had b/l STN.  He did well after this.  Tremor and balance greatly improved.  He moved here in 2003.  He began to f/u with a neurologist in [place].  Ultimately, they had trouble travelling to [facility] and f/u with Dr. Sandria Manly in 2006.  In may, 2007, he had a battery change by Dr. Venetia Maxon.  He continued to do well until generator required replacement in late 2012, and unfortunately the patient got an infection following this, which required removal of the system in 07/2011.  Since that time the patient has been significantly disabled.  His wife states that they have been following with Dr. Vickey Huger but she did not wish to change any medications until he was seen here and determined if he was a DBS candidate.  He is now on hospice.    He is currently on carbidopa/levodopa CR  50/200 five times a day.  He is on pramipexole, 0.5 mg 5 times per day.  He is on amantadine 100 mg twice a day.  He does have trouble getting his medication on time at the [facility].  He does have O.H. Now and is on midodrine for  that.  Specific Symptoms:  Tremor: yes, persistent on the R, and intermittent on the L Voice: speech fluctuates, with hypophonic speech and dysarthria Sleep: sleeps well initially, but trouble maintaining sleep  Vivid Dreams:  no   Acting out dreams:  yes (rarely) Wet Pillows: yes (problematic.  Trouble catching saliva) Postural symptoms:  yes  Falls?  no Bradykinesia symptoms: shuffling gait, slow movements, slurred or difficult speech, drooling while awake and difficulty getting out of a chair Loss of smell:  yes Loss of taste:  yes Urinary Incontinence:  yes.  Uses depends.  Difficulty Swallowing:  no (now food is mechanical soft and that has helped).   Handwriting, micrographia: yes Trouble with ADL's:  yes  Trouble buttoning clothing: yes Depression:  yes (39 y/o child died in 03/27/2012) Memory changes:  no per wife, only if startled awake out of sleep Hallucinations:  yes  visual distortions: yes N/V:  yes Lightheaded:  yes (rarely)  Syncope: no Diplopia:  yes, started after battery change in 2007, and had an eye surgery with no help Dyskinesia:  no   PREVIOUS MEDICATIONS: in DataTop Study (selegeline/vit E arm), Sinemet CR, mirapex, comtan (stopped b/c of cost), amantadine  ALLERGIES:   Allergies  Allergen Reactions  . Morphine Other (See Comments)  unknown  . Nitroglycerin Other (See Comments)    unknown  . Risperidone And Related Other (See Comments)    unknown  . Tuberculin Tests Other (See Comments)    unknown  . Vancomycin Rash    CURRENT MEDICATIONS:  Current Outpatient Prescriptions on File Prior to Visit  Medication Sig Dispense Refill  . acetaminophen (TYLENOL) 325 MG tablet TAKE 1 TABLET IN THE EVENING DAILY      . amantadine (SYMMETREL) 100 MG capsule Take 100 mg by mouth 2 (two) times daily.       Marland Kitchen aspirin EC 81 MG tablet Take 81 mg by mouth daily.      Marland Kitchen HYDROcodone-acetaminophen (NORCO) 5-325 MG per tablet Take 1-2 tablets by mouth every 6  (six) hours as needed. 1 tablet for moderate pain, 2 tablets for severe pain.      Marland Kitchen ipratropium (ATROVENT) 0.02 % nebulizer solution Take 500 mcg by nebulization 4 (four) times daily. For shortness of breath      . midodrine (PROAMATINE) 5 MG tablet Take 5 mg by mouth 2 (two) times daily.       . pantoprazole (PROTONIX) 40 MG tablet Take 1 tablet (40 mg total) by mouth daily at 6 (six) AM.  30 tablet  3  . PARoxetine (PAXIL) 20 MG tablet Take 1 tablet (20 mg total) by mouth every morning.  30 tablet  11  . polyethylene glycol (MIRALAX / GLYCOLAX) packet Take 17 g by mouth daily as needed. For constipation      . pramipexole (MIRAPEX) 0.5 MG tablet Take 0.5 mg by mouth 5 (five) times daily.      Marland Kitchen pyridOXINE (VITAMIN B-6) 50 MG tablet Take 50 mg by mouth daily.      Marland Kitchen saccharomyces boulardii (FLORASTOR) 250 MG capsule Take 250 mg by mouth 2 (two) times daily. Take for 10 days      . zolpidem (AMBIEN) 5 MG tablet TAKE 1 TABLET A DAY AT BEDTIME      . Docosanol (ABREVA EX) Apply 1 application topically 5 (five) times daily. For 1 week      . [DISCONTINUED] clonazePAM (KLONOPIN) 0.5 MG tablet Take 0.5 mg by mouth at bedtime as needed.        PAST MEDICAL HISTORY:   Past Medical History  Diagnosis Date  . HYPERLIPIDEMIA-MIXED 02/10/2008  . PARKINSON'S DISEASE 02/10/2008  . HYPERTENSION, BENIGN 02/09/2009  . GASTROINTESTINAL HEMORRHAGE, HX OF 02/10/2008  . ANEMIA 04/29/2010  . FIBRILLATION, ATRIAL 04/29/2010  . VENTRAL HERNIA 04/29/2010  . DEGENERATIVE JOINT DISEASE 04/29/2010  . Gastric ulcer   . Positive PPD   . Internal hemorrhoids   . Hyperplastic colon polyp 2004  . CAD (coronary artery disease)     23.0 x 24 mm Taxus DES mid LAD 2005, DCA diagonal branch 2005  . VENOUS INSUFFICIENCY 02/10/2008    rt  . Shortness of breath   . Osteomyelitis   . Calculus of kidney   . Tuberculosis     79 yrs old  . GERD (gastroesophageal reflux disease)     ulcers hx  . Infection and inflammatory  reaction due to device, implant, and graft   . Neuromuscular disorder   . Sundowning     past lumbar decompressive surgery  . Chronic kidney disease (CKD), stage III (moderate)   . Blood transfusion without reported diagnosis     PAST SURGICAL HISTORY:   Past Surgical History  Procedure Date  . Replacement total knee 2002  right  . Nose surgery   . Ankle surgery     left  . Toe amputation     right foot  . Cataract extraction   . Carpal tunnel release     left, open rt  . Tonsillectomy   . Appendectomy   . Coronary angioplasty with stent placement   . Back surgery 2006     back x3  . Deep brain stimulator placement 02    batteries changed 07  . Subthalamic stimulator battery replacement 02/27/2011    Procedure: SUBTHALAMIC STIMULATOR BATTERY REPLACEMENT;  Surgeon: Dorian Heckle, MD;  Location: [facility];  Service: Neurosurgery;  Laterality: Left;  DBS battery replacment x 2 - Left Chest and Abdomen  . Eye muscle surgery     bilat  . Subthalamic stimulator removal 05/22/2011    Procedure: SUBTHALAMIC STIMULATOR REMOVAL;  Surgeon: Maeola Harman, MD;  Location: [facility];  Service: Neurosurgery;  Laterality: N/A;  removal of IPG  . Subthalamic stimulator removal 07/24/2011    Procedure: SUBTHALAMIC STIMULATOR REMOVAL;  Surgeon: Maeola Harman, MD;  Location: [facility];  Service: Neurosurgery;  Laterality: N/A;  Deep Brain stimulator hardware removal  . Foot amputation through metatarsal     right    SOCIAL HISTORY:   History   Social History  . Marital Status: Married    Spouse Name: N/A    Number of Children: 4  . Years of Education: N/A   Occupational History  . retired Building surveyor professor    Social History Main Topics  . Smoking status: Never Smoker   . Smokeless tobacco: Never Used  . Alcohol Use: No  . Drug Use: No  . Sexually Active: No   Other Topics Concern  . Not on file   Social History Narrative  . No narrative on file    FAMILY HISTORY:    Family Status  Relation Status Death Age  . Mother Deceased     Cerebral embolism  . Father Deceased     "old age"  . Brother Alive     Renal failure  . Sister Alive     2, one with PD  . Child Deceased     04-12-2012(?CAD)  . Child Alive     3, crohns ds    ROS:  A complete 10 system review of systems was obtained and was unremarkable apart from what is mentioned above.  PHYSICAL EXAMINATION:    VITALS:   Filed Vitals:   03/31/12 1010  BP: 132/80  Pulse: 72  Temp: 97.5 F (36.4 C)  Resp: 18    GEN:  The patient appears stated age and is in NAD. HEENT:  Normocephalic, atraumatic.  The mucous membranes are moist. The superficial temporal arteries are without ropiness or tenderness. CV:  RRR Lungs:  CTAB Neck/HEME:  There are no carotid bruits bilaterally.  Neurological examination:  Orientation: The patient scored an 18/30.    Some of this was related to his inability to draw a clock because of tremor but he also has trouble with recall. Cranial nerves: There is good facial symmetry.  There is facial hypomima. Pupils are equal round and reactive to light bilaterally. Fundoscopic exam reveals clear margins bilaterally. There is R exotropia. The visual fields are full to confrontational testing. The speech is fluent, hypophonic and mildly dysarthric.  Soft palate rises symmetrically and there is no tongue deviation. Hearing is intact to conversational tone. Sensation: Sensation is intact to light and  pinprick throughout (facial, trunk, extremities). Vibration is markedly decreased distally. There is no extinction with double simultaneous stimulation. There is no sensory dermatomal level identified. Motor: Strength is 5/5 in the bilateral upper and lower extremities.  He has pain with L shoulder abduction, making manual motor testing difficult.  Shoulder shrug is equal and symmetric.  There is no pronator drift. Deep tendon reflexes: Deep tendon reflexes are 0/4 at the  bilateral biceps, triceps, brachioradialis, patella and achilles. Plantar responses are downgoing bilaterally.  Movement examination: Tone: There is markedly increased tone in the right upper extremity.  Tone in the left upper extremity is normal, even with activation procedures.   Abnormal movements: There is a moderate right upper extremity resting tremor.  There is an intermittent left upper extremity resting tremor.  There is a chin tremor. Coordination:  There is  decremation with RAM's, with all forms of rapid outlining movements including hand opening and closing, finger taps and alternation of supination and pronation. Gait and Station: The patient has significant difficulty arising out of a chair even with maximal assistance.  He requires maximum assistance to stand and has difficulty just marching in place with maximal assistance.  The left foot sticks to the ground.   ASSESSMENT/PLAN:  1.  Advanced Parkinson's disease.  This has been complicated by motor fluctuations, orthostatic hypotension, and hallucinations.  -The patient is status post DBS removal in May, 2013 because of infection following a battery change.  They are inquiring re: DBS.  I talked to them about the fact that if he wanted to pursue this, then we would have to start with neuropsychologic testing.  He scored an 18/30 on MoCA so cognitive impairment may alone prevent candidacy.  Nonetheless, if they would like to undergo the testing,  I think it would be very reasonable.  They would like to think about this and they will call me back if they would like to proceed.  Even if neuropsychological testing results are favorable, then he would still need on off testing.  We would also need to make sure that he could medically handle to general anesthesia which is required during stage 2 of the procedure.  I also talked with them about the fact that he would likely not be able to do DBS while on hospice.  -I think that his Parkinson's  medications could be optimized further.  I'm going to leave that to Dr. Vickey Huger.  However, it may be useful to change him from the controlled release form of the carbidopa/levodopa to the immediate release and have him take it more times per day.  This may be easier now to hospice is helping him.  The CR formulation is very inconsistent in its action.  The addition of entacapone maybe also of value, now that it has gone generic.  Finally, I think that I would begin to reduce the Mirapex given the hallucinations and orthostatic hypotension.    -I talked to them about the fact that myobloc may be of great value for the sialorrhea, which has become problematic for the patient.  He is going to talk about it with Dr. Vickey Huger.   2.  Much greater than 50% of this visit was spent in counseling with the patient and the family discussing the above issues.   Time visit began:  10:15am.  End time of visit:  12:00pm 3.  Follow up here will be as needed.  If they want me to proceed with setting him up for the neuropsychologic  testing, I would be happy to do so.  They will let me know.

## 2012-04-09 ENCOUNTER — Telehealth: Payer: Self-pay | Admitting: Neurology

## 2012-04-09 NOTE — Telephone Encounter (Signed)
Records sent

## 2012-04-09 NOTE — Telephone Encounter (Signed)
Amy from [facility] called to request last office note (03/31/12) to be faxed to Dr. Marylou Flesher.  The patient has an appointment with her on 04/16/12.

## 2012-04-20 ENCOUNTER — Telehealth: Payer: Self-pay | Admitting: Neurology

## 2012-04-20 NOTE — Telephone Encounter (Signed)
Picked up an incoming call from the patient's wife. She wanted to let Dr. Arbutus Leas know that they have decided against the reinsertion of the DBS. They will follow Dr. Vickey Huger at [facility] for medication management. I told her I would let Dr. Arbutus Leas know of their decision.

## 2012-05-07 ENCOUNTER — Ambulatory Visit (HOSPITAL_BASED_OUTPATIENT_CLINIC_OR_DEPARTMENT_OTHER): Payer: Medicare Other | Admitting: Oncology

## 2012-05-07 ENCOUNTER — Encounter: Payer: Self-pay | Admitting: Oncology

## 2012-05-07 ENCOUNTER — Other Ambulatory Visit (HOSPITAL_BASED_OUTPATIENT_CLINIC_OR_DEPARTMENT_OTHER): Payer: Medicare Other | Admitting: Lab

## 2012-05-07 VITALS — BP 127/74 | HR 62 | Temp 97.0°F | Resp 18 | Ht 73.0 in | Wt 218.0 lb

## 2012-05-07 DIAGNOSIS — N039 Chronic nephritic syndrome with unspecified morphologic changes: Secondary | ICD-10-CM

## 2012-05-07 LAB — CBC WITH DIFFERENTIAL/PLATELET
Basophils Absolute: 0 10*3/uL (ref 0.0–0.1)
HCT: 38.1 % — ABNORMAL LOW (ref 38.4–49.9)
HGB: 12.9 g/dL — ABNORMAL LOW (ref 13.0–17.1)
MCH: 30.9 pg (ref 27.2–33.4)
MONO#: 0.6 10*3/uL (ref 0.1–0.9)
NEUT%: 71.8 % (ref 39.0–75.0)
Platelets: 183 10*3/uL (ref 140–400)
WBC: 7.6 10*3/uL (ref 4.0–10.3)
lymph#: 1 10*3/uL (ref 0.9–3.3)

## 2012-05-07 LAB — COMPREHENSIVE METABOLIC PANEL (CC13)
ALT: <6 U/L (ref 0–55)
BUN: 24.8 mg/dL (ref 7.0–26.0)
CO2: 24 mEq/L (ref 22–29)
Calcium: 9.3 mg/dL (ref 8.4–10.4)
Chloride: 104 mEq/L (ref 98–107)
Creatinine: 1.4 mg/dL — ABNORMAL HIGH (ref 0.7–1.3)
Total Bilirubin: 0.31 mg/dL (ref 0.20–1.20)

## 2012-05-07 LAB — CHCC SMEAR

## 2012-05-07 NOTE — Progress Notes (Signed)
[facility]  Telephone:([phone] Fax:([phone]) [phone]   OFFICE PROGRESS NOTE   Cc:  [name], MD  DIAGNOSIS:  Anemia of chronic inflammation/chronic kidney disease.    CURRENT THERAPY:  Watchful observation.   INTERVAL HISTORY: [name] 79 y.o. male returns for regular follow up with his wife for routine follow up.  His wife reported that he is has declined overall due to Parkinsons. He was recently admitted to Hospice. He is W/C bound.  He denied fever, chest pain, SOB, palpitation, nausea/vomiting, visible source of bleeding, lower back pain, skin rash. Reports increased gas and loose stools.  Past Medical History  Diagnosis Date  . HYPERLIPIDEMIA-MIXED 02/10/2008  . PARKINSON'S DISEASE 02/10/2008  . HYPERTENSION, BENIGN 02/09/2009  . GASTROINTESTINAL HEMORRHAGE, HX OF 02/10/2008  . ANEMIA 04/29/2010  . FIBRILLATION, ATRIAL 04/29/2010  . VENTRAL HERNIA 04/29/2010  . DEGENERATIVE JOINT DISEASE 04/29/2010  . Gastric ulcer   . Positive PPD   . Internal hemorrhoids   . Hyperplastic colon polyp 2004  . CAD (coronary artery disease)     23.0 x 24 mm Taxus DES mid LAD 2005, DCA diagonal branch 2005  . VENOUS INSUFFICIENCY 02/10/2008    rt  . Shortness of breath   . Osteomyelitis   . Calculus of kidney   . Tuberculosis     79 yrs old  . GERD (gastroesophageal reflux disease)     ulcers hx  . Infection and inflammatory reaction due to device, implant, and graft   . Neuromuscular disorder   . Sundowning     past lumbar decompressive surgery  . Chronic kidney disease (CKD), stage III (moderate)   . Blood transfusion without reported diagnosis     Past Surgical History  Procedure Laterality Date  . Replacement total knee  2002    right  . Nose surgery    . Ankle surgery      left  . Toe amputation      right foot  . Cataract extraction    . Carpal tunnel release      left, open rt  . Tonsillectomy    . Appendectomy    . Coronary angioplasty  with stent placement    . Back surgery  2006     back x3  . Deep brain stimulator placement  02    batteries changed 07  . Subthalamic stimulator battery replacement  02/27/2011    Procedure: SUBTHALAMIC STIMULATOR BATTERY REPLACEMENT;  Surgeon: Dorian Heckle, MD;  Location: [facility];  Service: Neurosurgery;  Laterality: Left;  DBS battery replacment x 2 - Left Chest and Abdomen  . Eye muscle surgery      bilat  . Subthalamic stimulator removal  05/22/2011    Procedure: SUBTHALAMIC STIMULATOR REMOVAL;  Surgeon: Maeola Harman, MD;  Location: [facility];  Service: Neurosurgery;  Laterality: N/A;  removal of IPG  . Subthalamic stimulator removal  07/24/2011    Procedure: SUBTHALAMIC STIMULATOR REMOVAL;  Surgeon: Maeola Harman, MD;  Location: [facility];  Service: Neurosurgery;  Laterality: N/A;  Deep Brain stimulator hardware removal  . Foot amputation through metatarsal      right    Current Outpatient Prescriptions  Medication Sig Dispense Refill  . acetaminophen (TYLENOL) 325 MG tablet TAKE 1 TABLET IN THE EVENING DAILY      . amantadine (SYMMETREL) 100 MG capsule Take 100 mg by mouth 2 (two) times daily.       Marland Kitchen aspirin EC 81 MG  tablet Take 81 mg by mouth daily.      . carbidopa-levodopa (SINEMET CR) 50-200 MG per tablet 1 tablet 5 times per day      . HYDROcodone-acetaminophen (NORCO) 5-325 MG per tablet Take 1-2 tablets by mouth every 6 (six) hours as needed. 1 tablet for moderate pain, 2 tablets for severe pain.      Marland Kitchen ipratropium (ATROVENT) 0.02 % nebulizer solution Take 500 mcg by nebulization 4 (four) times daily. For shortness of breath      . midodrine (PROAMATINE) 5 MG tablet Take 5 mg by mouth 2 (two) times daily.       . pantoprazole (PROTONIX) 40 MG tablet Take 1 tablet (40 mg total) by mouth daily at 6 (six) AM.  30 tablet  3  . PARoxetine (PAXIL) 20 MG tablet Take 1 tablet (20 mg total) by mouth every morning.  30 tablet  11  . pramipexole (MIRAPEX) 0.5 MG tablet Take  0.5 mg by mouth 5 (five) times daily.      Marland Kitchen pyridOXINE (VITAMIN B-6) 50 MG tablet Take 50 mg by mouth daily.      Marland Kitchen saccharomyces boulardii (FLORASTOR) 250 MG capsule Take 250 mg by mouth 2 (two) times daily. Take for 10 days      . zolpidem (AMBIEN) 5 MG tablet TAKE 1 TABLET A DAY AT BEDTIME      . [DISCONTINUED] clonazePAM (KLONOPIN) 0.5 MG tablet Take 0.5 mg by mouth at bedtime as needed.       No current facility-administered medications for this visit.    ALLERGIES:  is allergic to morphine; nitroglycerin; risperidone and related; tuberculin tests; and vancomycin.  REVIEW OF SYSTEMS:  The rest of the 14-point review of system was negative.   Filed Vitals:   05/07/12 1145  BP: 127/74  Pulse: 62  Temp: 97 F (36.1 C)  Resp: 18   Wt Readings from Last 3 Encounters:  05/07/12 218 lb (98.884 kg)  03/22/12 219 lb (99.338 kg)  03/08/12 216 lb (97.977 kg)   ECOG Performance status: 2 due to Parkinson.   PHYSICAL EXAMINATION:   General: Well-nourished man, in no acute distress. Eyes: no scleral icterus. ENT: There were no oropharyngeal lesions. Neck was without thyromegaly. Lymphatics: Negative cervical, supraclavicular or axillary adenopathy. Respiratory: lungs were clear bilaterally without wheezing or crackles. Cardiovascular: Regular rate and rhythm on today exam, S1/S2, without murmur, rub or gallop. There was no pedal edema. GI: abdomen was soft, flat, nontender, nondistended, without organomegaly. Muscoloskeletal: no spinal tenderness of palpation of vertebral spine. Skin exam was without erythema. Patient was wheelchair bound since exam table was too tall for him.   LABORATORY/RADIOLOGY DATA:  Lab Results  Component Value Date   WBC 7.6 05/07/2012   HGB 12.9* 05/07/2012   HCT 38.1* 05/07/2012   PLT 183 05/07/2012   GLUCOSE 103* 05/07/2012   CHOL 114 05/21/2011   TRIG 69.0 05/21/2011   HDL 47.10 05/21/2011   LDLCALC 53 05/21/2011   ALKPHOS 85 05/07/2012   ALT <6 Repeated and  Verified 05/07/2012   AST 19 05/07/2012   NA 138 05/07/2012   K 3.9 05/07/2012   CL 104 05/07/2012   CREATININE 1.4* 05/07/2012   BUN 24.8 05/07/2012   CO2 24 05/07/2012   INR 1.13 09/12/2011    ASSESSMENT AND PLAN:    1. Parkinson's: For 20 years, Controlled with Sinemet and Amandtadine per Neurology. He has continued to decline and is now on Hospice.  2. Hyperlipidemia: On  diet control   3. Recent infections of the DBS unit: S/p extraction; no active infection at this time.   4. Chronic kidney disease, stage III: Most likely due to chronic HTN in the past. He does not have evidence of fluid overload today.   5. Normocytic anemia within the past few months.  -  Diagnosis:  Anemia of chronic inflammation and chronic renal insufficiency.    Extensive work up have been negative for iron deficiency, VitB12 deficiency, hypothyroid, hemolysis, myeloma.   I cannot rule out meylodysplastic syndrome which can only be diagnosed by a bone marrow biopsy.   However, with only intermittent anemia, a bone marrow biopsy at this time would be of low clinical utility.  Hgb is up to 12.9 today. - Recommendation:   CBC every 3 months at facility. We will see him back as needed.  The length of time of the face-to-face encounter was 15 minutes. More than 50% of time was spent counseling and coordination of care.

## 2012-05-25 ENCOUNTER — Non-Acute Institutional Stay (SKILLED_NURSING_FACILITY): Payer: Medicare Other | Admitting: Internal Medicine

## 2012-05-26 DIAGNOSIS — K219 Gastro-esophageal reflux disease without esophagitis: Secondary | ICD-10-CM | POA: Insufficient documentation

## 2012-05-26 DIAGNOSIS — K5900 Constipation, unspecified: Secondary | ICD-10-CM | POA: Insufficient documentation

## 2012-05-26 NOTE — Progress Notes (Signed)
PROGRESS NOTE  VISIT TYPE: Routine  CHIEF COMPLAINT:  Manage PD & orthostatic hypotension  HISTORY OF PRESENT ILLNESS:  REASSESSMENT OF ONGOING PROBLEMS:  1) PARKINSON'S DISEASE: pt's PD is stable.  Denies progression of sx recently.  Pt is tolerating PD meds without any complications.   Pt is now hospice.  2 ORTHOSTATIC HYPOTENSION: The orthostatcic hypotension remains stable.  Pt is tolerating current meds without any adverse reactions.  No dizziness or lightheadedness reported.   PAST MEDICAL HISTORY : Reviewed.  No changes.  CURRENT MEDICATIONS: Reviewed per [facility]  REVIEW OF SYSTEMS: ROS is unobtainable due to pt being a poor historian  PHYSICAL EXAMINATION  VS:  T 96             P72            RR16          BP 118/68             POX % 97              WT (Lb)  GENERAL: no acute distress, normal body habitus EYES: conjunctivae normal, sclerae normal, normal eye lids NECK: supple, trachea midline, no neck masses, no thyroid tenderness, no thyromegaly LYMPHATICS: no LAN in the neck, no supraclavicular LAN RESPIRATORY: breathing is even & unlabored, BS CTAB CARDIAC: RRR, no murmur,no extra heart sounds, no edema GI: abdomen soft, normal BS, no masses, no tenderness, no hepatomegaly, no splenomegaly PSYCHIATRIC: the patient is alert & oriented to person, affect & behavior appropriate  LABS/RADIOLOGY: 3/14 Hb 12.6, mcv 87.9 ow cbc nl 12/13 bun 25, cr 1.42 ow cmp nl  ASSESSMENT/PLAN:  1) PD-stable.  Advanced.  On hospice. 2) Orthostatic hypotension- stable. 3) anemia- stable. 4) GERD-well controlled. 5) Constipation-well controlled. 6) OA- denies pain.

## 2012-07-20 ENCOUNTER — Other Ambulatory Visit: Payer: Self-pay | Admitting: Geriatric Medicine

## 2012-07-20 MED ORDER — HYDROCODONE-ACETAMINOPHEN 5-325 MG PO TABS: 1.0000 | ORAL_TABLET | Freq: Four times a day (QID) | ORAL | Status: DC | PRN

## 2012-07-22 ENCOUNTER — Telehealth: Payer: Self-pay | Admitting: Neurology

## 2012-07-22 NOTE — Telephone Encounter (Signed)
Patient's wife called. They have an appointment on September 02, 2012. Wife wants to know if they are able to move up appointment b/c patient reports having increasing episodes of freezing up that he reports last up to three hours. Wife not sure this is actually correct but, it must feel like it to him. They have gotten worse and are making him increasingly anxious. She is asking if you are able to see him before September 02, 2012 appointment. Please advise. Su Ley BS RN

## 2012-07-22 NOTE — Telephone Encounter (Signed)
LMVM for Mrs Cuda that I have forwarded this message to Dr. Vickey Huger and Ms. Leota Jacobsen, the scheduling person and I will get back to them as soon as I hear something. Su Ley BS RN

## 2012-07-30 ENCOUNTER — Encounter: Payer: Self-pay | Admitting: Neurology

## 2012-07-30 ENCOUNTER — Ambulatory Visit (INDEPENDENT_AMBULATORY_CARE_PROVIDER_SITE_OTHER): Payer: Medicare Other | Admitting: Neurology

## 2012-07-30 VITALS — BP 167/88 | HR 62 | Temp 96.8°F

## 2012-07-30 DIAGNOSIS — G20 Parkinson's disease: Secondary | ICD-10-CM

## 2012-07-30 NOTE — Patient Instructions (Signed)
Shoulder Range of Motion Exercises  The shoulder is the most flexible joint in the human body. Because of this it is also the most unstable joint in the body. All ages can develop shoulder problems. Early treatment of problems is necessary for a good outcome. People react to shoulder pain by decreasing the movement of the joint. After a brief period of time, the shoulder can become "frozen". This is an almost complete loss of the ability to move the damaged shoulder. Following injuries your caregivers can give you instructions on exercises to keep your range of motion (ability to move your shoulder freely), or regain it if it has been lost.   EXERCISES  EXERCISES TO MAINTAIN THE MOBILITY OF YOUR SHOULDER:  Codman's Exercise or Pendulum Exercise  · This exercise may be performed in a prone (face-down) lying position or standing while leaning on a chair with the opposite arm. Its purpose is to relax the muscles in your shoulder and slowly but surely increase the range of motion and to relieve pain.  · Lie on your stomach close to the side edge of the bed. Let your weak arm hang over the edge of the bed. Relax your shoulder, arm and hand. Let your shoulder blade relax and drop down.  · Slowly and gently swing your arm forward and back. Do not use your neck muscles; relax them. It might be easier to have someone else gently start swinging your arm.  · As pain decreases, increase your swing. To start, arm swing should begin at 15 degree angles. In time and as pain lessens, move to 30-45 degree angles. Start with swinging for about 15 seconds, and work towards swinging for 3 to 5 minutes.  · This exercise may also be performed in a standing/bent over position.  · Stand and hold onto a sturdy chair with your good arm. Bend forward at the waist and bend your knees slightly to help protect your back. Relax your weak arm, let it hang limp. Relax your shoulder blade and let it drop.  · Keep your shoulder relaxed and use body  motion to swing your arm in small circles.  · Stand up tall and relax.  · Repeat motion and change direction of circles.  · Start with swinging for about 30 seconds, and work towards swinging for 3 to 5 minutes.  STRETCHING EXERCISES:  · Lift your arm out in front of you with the elbow bent at 90 degrees. Using your other arm gently pull the elbow forward and across your body.  · Bend one arm behind you with the palm facing outward. Using the other arm, hold a towel or rope and reach this arm up above your head, then bend it at the elbow to move your wrist to behind your neck. Grab the free end of the towel with the hand behind your back. Gently pull the towel up with the hand behind your neck, gradually increasing the pull on the hand behind the small of your back. Then, gradually pull down with the hand behind the small of your back. This will pull the hand and arm behind your neck further. Both shoulders will have an increased range of motion with repetition of this exercise.  STRENGTHENING EXERCISES:  · Standing with your arm at your side and straight out from your shoulder with the elbow bent at 90 degrees, hold onto a small weight and slowly raise your hand so it points straight up in the air. Repeat this five   times to begin with, and gradually increase to ten times. Do this four times per day. As you grow stronger you can gradually increase the weight.  · Repeat the above exercise, only this time using an elastic band. Start with your hand up in the air and pull down until your hand is by your side. As you grow stronger, gradually increase the amount you pull by increasing the number or size of the elastic bands. Use the same amount of repetitions.  · Standing with your hand at your side and holding onto a weight, gradually lift the hand in front of you until it is over your head. Do the same also with the hand remaining at your side and lift the hand away from your body until it is again over your head.  Repeat this five times to begin with, and gradually increase to ten times. Do this four times per day. As you grow stronger you can gradually increase the weight.  Document Released: 11/23/2002 Document Revised: 05/19/2011 Document Reviewed: 02/24/2005  [employer]® Patient Information ©2014 [employer].

## 2012-07-30 NOTE — Assessment & Plan Note (Signed)
Diagnosed in 1980's , had DBS bilaterally since 2002-2013 , now on medication only.

## 2012-07-30 NOTE — Progress Notes (Signed)
Dr Venetia Maxon / formerly Dr Sandria Manly . PD.  Dr. Lockie Mola established patient is Parkinson's disease, has originally been followed in this practice by Dr. love. His diagnosis was established in 1987 and he was on multiple medication regimens before he finally had a deep brain stimulator implanted bilaterally in 2002 with.  This was done by the was living near  [place], [place]. He had one  Battery change in 2007 without complications,   After a battery change for the DBS in 2012  he caught  a rare  mycotic and infection and his devices had to be removed in March 2013 .  Since then he can only rely on medication for the treatment of Parkinson's disease and has been over a year now with old food the brain stimulator. He has been unable to transfer he has tolerated increased doses of Sinemet and Mirapex as well as more frequent intake times. The problem appears to be has some nursing home setting in which the medications are not given on time nor is the patient permitted to take them for himself or keep them at the bedside. Just last week Mr. balls experienced a prolonged freezing episodes of more severe so far. He has been followed also by hospice. Hospice has been seeing him weekly but is not responsible for the medications  . He remains wheelchair bound increasingly stiff and and his posture even seated has changed over the last 2 months he has much more neck rigidity and developed the hump. His pain but arises from the shoulders and neck has been responding to voltaren as of last December. The patient has increasing dysphonia, and problems to handle saliva. His speech is more dysphonic and non-fluent than in Dec 2013 . He remains at [facility].   He has dependent edema in his left hand this skin discoloration but could be amantadine into was he also has skin lesions that seem to be slowly healing. There is a bleeding scratch on the on the shin right leg.  Specific Symptoms:  Tremor: yes, persistent on  the R, and intermittent on the L  Voice: speech fluctuates, with hypophonic speech and dysarthria  Sleep: sleeps well initially, but trouble maintaining sleep  Vivid Dreams: no  Acting out dreams: yes (rarely)  Wet Pillows: yes (problematic. Trouble catching saliva)  Postural symptoms: yes  Falls? no  Bradykinesia symptoms: shuffling gait, slow movements, slurred or difficult speech, drooling while awake and difficulty getting out of a chair  Loss of smell: yes  Loss of taste: yes  Urinary Incontinence: yes. Uses depends.  Difficulty Swallowing: no (now food is mechanical soft and that has helped).  Handwriting, micrographia: yes  Trouble with ADL's: yes  Trouble buttoning clothing: yes  Depression: yes (22 y/o child died in Apr 17, 2012)  Memory changes: no per wife, only if startled awake out of sleep  Hallucinations: yes  visual distortions: yes  N/V: yes  Lightheaded: yes (rarely)  Syncope: no  Diplopia: yes, started after battery change in 2007, and had an eye surgery with no help  Dyskinesia: no  PREVIOUS MEDICATIONS: in [employer] Study (selegeline/vit E arm), Sinemet CR, mirapex, comtan (stopped b/c of cost), amantadine   Current Outpatient Prescriptions on File Prior to Visit  Medication Sig Dispense Refill  . acetaminophen (TYLENOL) 325 MG tablet TAKE 1 TABLET IN THE EVENING DAILY      . amantadine (SYMMETREL) 100 MG capsule Take 100 mg by mouth 2 (two) times daily.       Marland Kitchen  aspirin EC 81 MG tablet Take 81 mg by mouth daily.      . carbidopa-levodopa (SINEMET CR) 50-200 MG per tablet 1 tablet 5 times per day      . ipratropium (ATROVENT) 0.02 % nebulizer solution Take 500 mcg by nebulization 4 (four) times daily. For shortness of breath      . midodrine (PROAMATINE) 5 MG tablet Take 5 mg by mouth 2 (two) times daily.       . pantoprazole (PROTONIX) 40 MG tablet Take 1 tablet (40 mg total) by mouth daily at 6 (six) AM.  30 tablet  3  . PARoxetine (PAXIL) 20 MG tablet Take 1  tablet (20 mg total) by mouth every morning.  30 tablet  11  . pramipexole (MIRAPEX) 0.5 MG tablet Take 0.5 mg by mouth 5 (five) times daily.      Marland Kitchen pyridOXINE (VITAMIN B-6) 50 MG tablet Take 50 mg by mouth daily.      Marland Kitchen saccharomyces boulardii (FLORASTOR) 250 MG capsule Take 250 mg by mouth 2 (two) times daily. Take for 10 days      . zolpidem (AMBIEN) 5 MG tablet TAKE 1 TABLET A DAY AT BEDTIME      . [DISCONTINUED] clonazePAM (KLONOPIN) 0.5 MG tablet Take 0.5 mg by mouth at bedtime as needed.       No current facility-administered medications on file prior to visit.    Sinemet 50/200 to be given at 6 AM, 9 AM, 12 noon, 1500 hours, 1800 hours and one full tablet at 2100 hours 9 PM.  Mirapex a half milligram tablets 5 times daily at 6 AM 10 AM 2 PM 6 PM and 9 pm. Amantadine 100 capsules one in the morning and one at noon  Midrin 5 mg in the morning and at noon.    Assessment: Advanced stage of  Parkinson's disease in the late stages without major cognitive impairment. Related or secondary to condition #1 are rigidity, inability to ambulate, pain, progressive postural changes that affect this patient's primary axial skeleton . Due to his non-ambulatory state he has also lost muscle mass in the upper extremities, but cannot be attributed to his spinal stenosis.  He has stasis related skin changes in the left arm and hand and both legs.   Plan is to arrange for daily exercise to a lower the patient a reduction in rigidity this can be passive range of motion or isometric  Exercises. Counts at ambulation should be made  The patient score today and am Orsi a past 21/ 25 - short-term memory loss was the delayed recall words. He was completely able to do serial 7 exercises repeat a list of digits repeat sentences and generate words.  (25 points test after being adjusted for Parkinson's patients).  He is actively complaining about pain there are topical pain relief medications that could be  applied to tube for neuropathic pain burning pain or dysesthesias, but in this patient I feel it is strongly related to his rigidity and upper normal muscle tone and the resulting abnormal posture. A narcotic pain medication is not indicated,  I would like for him to receive non-steroidals  -after a meal -if tolerated by renal and hepatic function.   CC Dr Venetia Maxon , CC Dr Otelia Sergeant. CC Dr Art Chilton Si.

## 2012-07-30 NOTE — Addendum Note (Signed)
Addended by: Melvyn Novas on: 07/30/2012 05:10 PM   Modules accepted: Orders

## 2012-08-27 ENCOUNTER — Non-Acute Institutional Stay (SKILLED_NURSING_FACILITY): Payer: Medicare Other | Admitting: Internal Medicine

## 2012-08-27 DIAGNOSIS — D649 Anemia, unspecified: Secondary | ICD-10-CM

## 2012-08-27 DIAGNOSIS — I951 Orthostatic hypotension: Secondary | ICD-10-CM

## 2012-08-27 DIAGNOSIS — G20 Parkinson's disease: Secondary | ICD-10-CM

## 2012-08-27 DIAGNOSIS — K219 Gastro-esophageal reflux disease without esophagitis: Secondary | ICD-10-CM

## 2012-08-29 NOTE — Progress Notes (Signed)
PROGRESS NOTE DATE: 08/27/12  FACILITY: [facility]  VISIT TYPE: Routine  CHIEF COMPLAINT:  Manage PD & orthostatic hypotension  HISTORY OF PRESENT ILLNESS:  REASSESSMENT OF ONGOING PROBLEMS:  1) PARKINSON'S DISEASE: pt's PD is stable.  Denies progression of sx recently.  Pt is tolerating PD meds without any complications.   Pt is now hospice.  2 ORTHOSTATIC HYPOTENSION: The orthostatcic hypotension remains stable.  Pt is tolerating current meds without any adverse reactions.  No dizziness or lightheadedness reported.   PAST MEDICAL HISTORY : Reviewed.  No changes.  CURRENT MEDICATIONS: Reviewed per [facility]  REVIEW OF SYSTEMS: ROS is unobtainable due to pt being a poor historian  PHYSICAL EXAMINATION  VS:  T 98.5    P66    RR18    BP 111/71    POX % 97         GENERAL: no acute distress, normal body habitus NECK: supple, trachea midline, no neck masses, no thyroid tenderness, no thyromegaly RESPIRATORY: breathing is even & unlabored, BS CTAB CARDIAC: RRR, no murmur,no extra heart sounds, no edema GI: abdomen soft, normal BS, no masses, no tenderness, no hepatomegaly, no splenomegaly PSYCHIATRIC: the patient is alert & oriented to person, affect & behavior appropriate  LABS/RADIOLOGY:  3/14 Hb 12.6, mcv 87.9 ow cbc nl 12/13 bun 25, cr 1.42 ow cmp nl  ASSESSMENT/PLAN:  1) PD-stable.  Advanced.  On hospice. 2) Orthostatic hypotension- stable. 3) anemia- stable. 4) GERD-well controlled. 5) Constipation-lactulose was started. 6) OA- denies pain.  CPT CODE: 29562

## 2012-09-02 ENCOUNTER — Ambulatory Visit: Payer: Self-pay | Admitting: Neurology

## 2012-09-16 ENCOUNTER — Other Ambulatory Visit: Payer: Self-pay | Admitting: Geriatric Medicine

## 2012-09-16 MED ORDER — ZOLPIDEM TARTRATE 5 MG PO TABS: 5.0000 mg | ORAL_TABLET | Freq: Every evening | ORAL | Status: DC | PRN

## 2012-09-27 ENCOUNTER — Non-Acute Institutional Stay (SKILLED_NURSING_FACILITY): Payer: Medicare Other | Admitting: Internal Medicine

## 2012-09-27 DIAGNOSIS — G20C Parkinsonism, unspecified: Secondary | ICD-10-CM

## 2012-09-27 DIAGNOSIS — G20 Parkinson's disease: Secondary | ICD-10-CM

## 2012-09-27 DIAGNOSIS — D649 Anemia, unspecified: Secondary | ICD-10-CM

## 2012-09-27 DIAGNOSIS — K219 Gastro-esophageal reflux disease without esophagitis: Secondary | ICD-10-CM

## 2012-09-27 DIAGNOSIS — I951 Orthostatic hypotension: Secondary | ICD-10-CM

## 2012-10-01 NOTE — Progress Notes (Signed)
PROGRESS NOTE DATE: 09/27/12  FACILITY: [facility]  VISIT TYPE: Routine  CHIEF COMPLAINT:  Manage PD & orthostatic hypotension  HISTORY OF PRESENT ILLNESS:  REASSESSMENT OF ONGOING PROBLEMS:  PARKINSON'S DISEASE: pt's PD is stable.  Denies progression of sx recently.  Pt is tolerating PD meds without any complications.   Pt is now hospice.  ORTHOSTATIC HYPOTENSION: The orthostatcic hypotension remains stable.  Pt is tolerating current meds without any adverse reactions.  No dizziness or lightheadedness reported.   PAST MEDICAL HISTORY : Reviewed.  No changes.  CURRENT MEDICATIONS: Reviewed per [facility]  REVIEW OF SYSTEMS: ROS is unobtainable due to pt being a poor historian  PHYSICAL EXAMINATION  VS:  T 97    P 54    RR 20   BP 125/91    POX % 97         GENERAL: no acute distress, normal body habitus NECK: supple, trachea midline, no neck masses, no thyroid tenderness, no thyromegaly RESPIRATORY: breathing is even & unlabored, BS CTAB CARDIAC: RRR, no murmur,no extra heart sounds, +1 bilateral lower extremity edema GI: abdomen soft, normal BS, no masses, no tenderness, no hepatomegaly, no splenomegaly PSYCHIATRIC: the patient is alert & oriented to person, affect & behavior appropriate  LABS/RADIOLOGY:  6/14 hemoglobin 12.8, MCV 91.3 otherwise CBC normal, glucose 102, BUN 24, creatinine 1.3 otherwise CMP normal  3/14 Hb 12.6, mcv 87.9 ow cbc nl 12/13 bun 25, cr 1.42 ow cmp nl  ASSESSMENT/PLAN:  PD-stable.  Advanced.  On hospice. Orthostatic hypotension- stable. anemia- stable. GERD-well controlled. Constipation-no complaints OA- denies pain. Anxiety-Ativan was started. We did not do further labs due to hospice status  CPT CODE: 16109

## 2012-10-29 ENCOUNTER — Non-Acute Institutional Stay (SKILLED_NURSING_FACILITY): Payer: Medicare Other | Admitting: Adult Health

## 2012-10-29 DIAGNOSIS — F411 Generalized anxiety disorder: Secondary | ICD-10-CM

## 2012-10-29 DIAGNOSIS — K5900 Constipation, unspecified: Secondary | ICD-10-CM

## 2012-10-29 DIAGNOSIS — F3289 Other specified depressive episodes: Secondary | ICD-10-CM

## 2012-10-29 DIAGNOSIS — F329 Major depressive disorder, single episode, unspecified: Secondary | ICD-10-CM

## 2012-10-29 DIAGNOSIS — G20 Parkinson's disease: Secondary | ICD-10-CM

## 2012-10-29 DIAGNOSIS — G20A1 Parkinson's disease without dyskinesia, without mention of fluctuations: Secondary | ICD-10-CM

## 2012-10-29 DIAGNOSIS — F419 Anxiety disorder, unspecified: Secondary | ICD-10-CM

## 2012-10-29 DIAGNOSIS — G4700 Insomnia, unspecified: Secondary | ICD-10-CM

## 2012-10-29 DIAGNOSIS — I951 Orthostatic hypotension: Secondary | ICD-10-CM

## 2012-10-29 DIAGNOSIS — Z8711 Personal history of peptic ulcer disease: Secondary | ICD-10-CM

## 2012-11-02 ENCOUNTER — Encounter: Payer: Self-pay | Admitting: Adult Health

## 2012-11-02 DIAGNOSIS — G4700 Insomnia, unspecified: Secondary | ICD-10-CM | POA: Insufficient documentation

## 2012-11-02 DIAGNOSIS — F329 Major depressive disorder, single episode, unspecified: Secondary | ICD-10-CM | POA: Insufficient documentation

## 2012-11-02 DIAGNOSIS — F419 Anxiety disorder, unspecified: Secondary | ICD-10-CM | POA: Insufficient documentation

## 2012-11-02 DIAGNOSIS — K259 Gastric ulcer, unspecified as acute or chronic, without hemorrhage or perforation: Secondary | ICD-10-CM | POA: Insufficient documentation

## 2012-11-02 NOTE — Progress Notes (Signed)
Patient ID: [name], male   DOB: 11-30-1929, 79 y.o.   MRN: [id number]       PROGRESS NOTE  DATE: 10/29/2012  FACILITY: Nursing Home Location: [facility]  LEVEL OF CARE:  SNF (31)  Routine Visit  CHIEF COMPLAINT:  Manage orthostatic hypotension, Parkinson's disease, gastric ulcer and depression  HISTORY OF PRESENT ILLNESS:  REASSESSMENT OF ONGOING PROBLEM(S):  PARKINSON'S DISEASE: pt's Parkinson's disease is stable.  Denies progression of sx recently.  Pt is tolerating Parkinson's disease medications without any complications.  ORTHOSTATIC HYPOTENSION: The orthostatcic hypotension remains stable.  Pt is tolerating current medications without any adverse reactions.  No dizziness or lightheadedness reported.  DEPRESSION: The depression remains stable. Patient denies ongoing feelings of sadness, insomnia, anedhonia or lack of appetite. No complications reported from the medications currently being used. Staff do not report behavioral problems.  PAST MEDICAL HISTORY : Reviewed.  No changes.  CURRENT MEDICATIONS: Reviewed per [facility]  REVIEW OF SYSTEMS:  GENERAL: no change in appetite, no fatigue, no weight changes, no fever, chills or weakness RESPIRATORY: no cough, SOB, DOE, wheezing, hemoptysis CARDIAC: no chest pain, edema or palpitations GI: no abdominal pain, diarrhea, constipation, heart burn, nausea or vomiting  PHYSICAL EXAMINATION  VS:  T 96.7       P 66      RR 18      BP 104/64     POX 95 %     WT 223.8 (Lb)  GENERAL: no acute distress, normal body habitus EYES: conjunctivae normal, sclerae normal, normal eye lids NECK: supple, trachea midline, no neck masses, no thyroid tenderness, no thyromegaly LYMPHATICS: no LAN in the neck, no supraclavicular LAN RESPIRATORY: breathing is even & unlabored, BS CTAB CARDIAC: RRR, no murmur,no extra heart sounds, no edema GI: abdomen soft, normal BS, no masses, no tenderness, no hepatomegaly, no  splenomegaly PSYCHIATRIC: the patient is alert & oriented to person, affect & behavior appropriate  LABS/RADIOLOGY: 6/14 hemoglobin 12.8, MCV 91.3 otherwise CBC normal, glucose 102, BUN 24, creatinine 1.3 otherwise CMP normal 3/14 Hb 12.6, mcv 87.9 ow cbc nl 12/13 bun 25, cr 1.42 ow cmp nl   ASSESSMENT/PLAN:  Orthostatic hypotension - stable  Parkinson's disease - stable; followed up by hospice  Gastric ulcer, history - stable  Constipation - stable  Depression - stable  Anxiety - stable  Insomnia - no complaints   CPT CODE: 40981

## 2012-11-17 ENCOUNTER — Non-Acute Institutional Stay (SKILLED_NURSING_FACILITY): Payer: Medicare Other | Admitting: Adult Health

## 2012-11-17 DIAGNOSIS — F329 Major depressive disorder, single episode, unspecified: Secondary | ICD-10-CM

## 2012-11-30 ENCOUNTER — Ambulatory Visit (INDEPENDENT_AMBULATORY_CARE_PROVIDER_SITE_OTHER): Admitting: Neurology

## 2012-11-30 ENCOUNTER — Encounter: Payer: Self-pay | Admitting: Neurology

## 2012-11-30 ENCOUNTER — Ambulatory Visit: Payer: Medicare Other | Admitting: Neurology

## 2012-11-30 VITALS — BP 166/89 | HR 63 | Resp 18

## 2012-11-30 DIAGNOSIS — R490 Dysphonia: Secondary | ICD-10-CM

## 2012-11-30 DIAGNOSIS — G20A1 Parkinson's disease without dyskinesia, without mention of fluctuations: Secondary | ICD-10-CM | POA: Insufficient documentation

## 2012-11-30 DIAGNOSIS — G250 Essential tremor: Secondary | ICD-10-CM

## 2012-11-30 DIAGNOSIS — R251 Tremor, unspecified: Secondary | ICD-10-CM

## 2012-11-30 DIAGNOSIS — R1310 Dysphagia, unspecified: Secondary | ICD-10-CM

## 2012-11-30 DIAGNOSIS — G20 Parkinson's disease: Secondary | ICD-10-CM

## 2012-11-30 NOTE — Progress Notes (Signed)
Chief Complaint  Patient presents with  . Follow-up    parkinson's,rm 11     Please review history and history of present illness form last visit 07-30-12 .   Dr Venetia Maxon / formerly Dr Sandria Manly .  PD.  Dr. Lockie Mola established patient is Parkinson's disease, has originally been followed in this practice by Dr. love. His diagnosis was established in 1987 and he was on multiple medication regimens before he finally had a deep brain stimulator implanted bilaterally in 2002 with.  This was done by the was living near [place], [place]. He had one Battery change in 2007 without complications,  After a battery change for the DBS in 2012 he caught a rare mycotic and infection and his devices had to be removed in March 2013 .  Since then he can only rely on medication for the treatment of Parkinson's disease and has been over a year now with old food the brain stimulator. He has been unable to transfer he has tolerated increased doses of Sinemet and Mirapex as well as more frequent intake times. The problem appears to be the  nursing home setting,  in which the medications are not given on time- nor is the patient permitted to take them for himself /keep them at the bedside.  Hospice has been seeing him weekly but is not responsible for the medications.  He remains wheelchair bound,  increasingly stiff and and his posture even seated has changed over the last 2 months he has much more neck rigidity and developed the hump. His pain but arises from the shoulders and neck has been responding to Voltaren ( as of  December 2013 ).  The patient has increasing dysphonia, and problems to handle saliva. His speech is more dysphonic and non-fluent than in Dec 2013 .  In Dec 2013 he was accepted by hospice to be followed.  He remains at [facility].  He has dependent edema in his left hand this skin discoloration but could be amantadine i. skin lesions that seem to be slowly healing. There is a bleeding scratch on the  on the shin right leg.  Specific Symptoms:  Tremor: yes, persistent on the R, and intermittent on the L  Voice: speech fluctuates, with hypophonic speech and dysarthria  Sleep: sleeps well initially, but trouble maintaining sleep  Vivid Dreams: no  Acting out dreams: yes (rarely)  Wet Pillows: yes (problematic. Trouble catching saliva)  Postural symptoms: yes  Falls? no  Bradykinesia symptoms: he can barely transfer, no longer any gait, slow movements, Rigidity both legs - slurred or difficult speech, drooling while awake . Loss of smell: yes  Loss of taste: yes  Urinary Incontinence: yes. Uses depends.  Difficulty Swallowing: no (now food is mechanical soft and that has helped).  Handwriting, micrographia: yes  Trouble with ADL's: yes  Trouble buttoning clothing: yes  Memory changes: no per wife, only if startled awake out of sleep  Hallucinations: yes  visual distortions: yes - continued .  N/V: yes  Lightheaded: yes (rarely)  Syncope: no  Diplopia: yes, started after battery change in 2007, and had an eye surgery with no help  Dyskinesia: none  Sinemet 50/200 to be given at 6 AM, 9 AM, 12 noon, 1500 hours, 1800 hours and one full tablet at 2100 hours 9 PM.  Mirapex a half milligram tablets 5 times daily at 6 AM 10 AM 2 PM 6 PM and 9 pm.  Amantadine 100 capsules one in the morning and  one at noon  Midrin 5 mg in the morning and at noon.   Cranial nerves: There is good facial symmetry. There is facial hypomima. Pupils are equal round and reactive to light bilaterally. Floaters in both eyes, more in the left- ptosis in the left.  Fundoscopic exam reveals clear margins bilaterally.  There is R exotropia. The visual fields are full to confrontational testing.  The speech is fluent, hypophonic and mildly dysarthric. Soft palate has limited Rise - there is no tongue deviation. tremor of the tongue.   Hearing is intact to conversational tone.  Sensation: Sensation is intact to light  and pinprick throughout (facial, trunk, extremities). Vibration is markedly decreased distally. There is no extinction with double simultaneous stimulation. There is no sensory dermatomal level identified.  Motor: Strength is 5/5 in the bilateral upper and lower extremities.  He has pain with L shoulder abduction, making manual motor testing difficult.  Shoulder shrug  Reduced on the left- painful ROM limitation to the left shoulder .  There is no pronator drift.  Left knee  Stiffness worse than right , unable to flex either  knee and move either foot , dorsiflexion weakness.  Deep tendon reflexes: Deep tendon reflexes are 0/4 at the bilateral biceps, triceps, brachioradialis, patella and achilles. Plantar responses are downgoing bilaterally.  Movement examination:  Tone: There is markedly increased tone in the right upper extremity. Tone in the left upper extremity is normal, even with activation procedures.  Abnormal movements: There is a moderate right upper extremity resting tremor. There is an intermittent left upper extremity resting tremor. There is a chin tremor.   Coordination: There is decremation with RAM's, with all forms of rapid outlining movements including hand opening and closing, finger taps and alternation of supination and pronation.   Gait and Station: The patient is unable to  arising out of a chair even with maximal assistance.   He requires maximum assistance to stand and has been unable to walk. The left foot sticks to the ground.        Assessment:   Advanced stage of Parkinson's disease in the late stages without major cognitive impairment.  Related or secondary to condition #1 : increasing rigidity, restricted ROM,  inability to ambulate, pain, progressive postural changes that affect this patient's primary axial skeleton .  The patient has dysphonia and dysphagia. Solid food is most difficult to swallow. He can handle pureed and liquids.   Due to his  non-ambulatory state he has also lost muscle mass in the upper extremities, which  cannot be attributed to his spinal stenosis.  There was for at least 1 year now  atrophy of the lower extremities.  He has stasis related skin changes in the left arm and hand and both legs.  Plan is to arrange for daily exercise to a lower the patient a reduction in rigidity this can be improved with  passive range of motion / isometric Exercises.  Step Counts at ambulation should be made .  ST to evaluate swallowing.  Patient is breathing more shallow , rec. pulmonary exercises.  Patient will require turning every 3-4 hours at night , to avoid bedsores.

## 2012-12-02 ENCOUNTER — Telehealth: Payer: Self-pay | Admitting: Neurology

## 2012-12-07 ENCOUNTER — Non-Acute Institutional Stay (SKILLED_NURSING_FACILITY): Payer: Medicare Other | Admitting: Adult Health

## 2012-12-07 DIAGNOSIS — K5900 Constipation, unspecified: Secondary | ICD-10-CM

## 2012-12-07 DIAGNOSIS — I951 Orthostatic hypotension: Secondary | ICD-10-CM

## 2012-12-07 DIAGNOSIS — G20 Parkinson's disease: Secondary | ICD-10-CM

## 2012-12-07 DIAGNOSIS — G4700 Insomnia, unspecified: Secondary | ICD-10-CM

## 2012-12-07 DIAGNOSIS — F419 Anxiety disorder, unspecified: Secondary | ICD-10-CM

## 2012-12-07 DIAGNOSIS — F411 Generalized anxiety disorder: Secondary | ICD-10-CM

## 2012-12-07 DIAGNOSIS — Z8711 Personal history of peptic ulcer disease: Secondary | ICD-10-CM

## 2012-12-07 DIAGNOSIS — F329 Major depressive disorder, single episode, unspecified: Secondary | ICD-10-CM

## 2012-12-07 NOTE — Progress Notes (Signed)
Patient ID: [name], male   DOB: 1930-02-10, 79 y.o.   MRN: [id number]       PROGRESS NOTE  DATE:12/07/12   FACILITY: Nursing Home Location: [facility]  LEVEL OF CARE: SNF (31)SNF (31)  Routine Visit  CHIEF COMPLAINT:  Manage orthostatic hypotension, Parkinson's disease, gastric ulcer and depression  HISTORY OF PRESENT ILLNESS:  REASSESSMENT OF ONGOING PROBLEM(S):  INSOMNIA: The insomnia remains stable.  No complications noted from the medications presently being used. Patient denies ongoing insomnia, pain, hallucinations, delusions.  PARKINSON'S DISEASE: pt's Parkinson's disease is stable.  Denies progression of sx recently.  Pt is tolerating Parkinson's disease medications without any complications.  ANXIETY: The anxiety remains stable. Patient denies ongoing anxiety or irritability. No complications reported from the medications currently being used.  PAST MEDICAL HISTORY : Reviewed.  No changes.  CURRENT MEDICATIONS: Reviewed per [facility]  REVIEW OF SYSTEMS:  GENERAL: no change in appetite, no fatigue, no weight changes, no fever, chills or weakness RESPIRATORY: no cough, SOB, DOE, wheezing, hemoptysis CARDIAC: no chest pain, edema or palpitations GI: no abdominal pain, diarrhea, constipation, heart burn, nausea or vomiting  PHYSICAL EXAMINATION  VS:  T 97.4      RR 18    BP134/86     POX 98 %     WT 221.6 (Lb)  GENERAL: no acute distress, normal body habitus EYES: conjunctivae normal, sclerae normal, normal eye lids NECK: supple, trachea midline, no neck masses, no thyroid tenderness, no thyromegaly RESPIRATORY: breathing is even & unlabored, BS CTAB CARDIAC: RRR, no murmur,no extra heart sounds, no edema GI: abdomen soft, normal BS, no masses, no tenderness, no hepatomegaly, no splenomegaly PSYCHIATRIC: the patient is alert & oriented to person, affect & behavior appropriate  LABS/RADIOLOGY: 6/14 hemoglobin 12.8, MCV 91.3 otherwise CBC normal,  glucose 102, BUN 24, creatinine 1.3 otherwise CMP normal 3/14 Hb 12.6, mcv 87.9 ow cbc nl 12/13 bun 25, cr 1.42 ow cmp nl   ASSESSMENT/PLAN:  Orthostatic hypotension - stable; continue Midodrine  Parkinson's disease - stable; followed up by hospice  Gastric ulcer, history - stable  Constipation - stable  Depression - stable; recently increased Paxil  Anxiety - stable  Insomnia - no complaints   CPT CODE: 40981

## 2012-12-07 NOTE — Progress Notes (Signed)
Patient ID: [name], male   DOB: Jul 31, 1929, 80 y.o.   MRN: [id number]       PROGRESS NOTE  DATE: 11/17/2012  FACILITY:  [facility]  LEVEL OF CARE: SNF (31)  Acute Visit  CHIEF COMPLAINT:  Manage Depression  HISTORY OF PRESENT ILLNESS:This is an 79 year old male who was noted to be in tears and wanting to talk to wife on the phone several times a day. Son recently passed away. Patient verbalized feeling "low."  PAST MEDICAL HISTORY : Reviewed.  No changes.  CURRENT MEDICATIONS: Reviewed per [facility]  REVIEW OF SYSTEMS:  GENERAL: no change in appetite, no fatigue, no weight changes, no fever, chills or weakness RESPIRATORY: no cough, SOB, DOE,, wheezing, hemoptysis CARDIAC: no chest pain, edema or palpitations GI: no abdominal pain, diarrhea, constipation, heart burn, nausea or vomiting PSYCHIATRIC: cries at times  PHYSICAL EXAMINATION  VS:  T 97.7        P 72        RR28        BP 109/79           WT 221.6 (Lb)  GENERAL: no acute distress, normal body habitus EYES: conjunctivae normal, sclerae normal, normal eye lids NECK: supple, trachea midline, no neck masses, no thyroid tenderness, no thyromegaly LYMPHATICS: no LAN in the neck, no supraclavicular LAN RESPIRATORY: breathing is even & unlabored, BS CTAB CARDIAC: RRR, no murmur,no extra heart sounds, no edema GI: abdomen soft, normal BS, no masses, no tenderness, no hepatomegaly, no splenomegaly PSYCHIATRIC: the patient is alert & oriented to person, affect & behavior appropriate  LABS/RADIOLOGY: 6/14 hemoglobin 12.8, MCV 91.3 otherwise CBC normal, glucose 102, BUN 24, creatinine 1.3 otherwise CMP normal 3/14 Hb 12.6, mcv 87.9 ow cbc nl 12/13 bun 25, cr 1.42 ow cmp nl   ASSESSMENT/PLAN:  Depression -  Increase Paxil to 30 mg PO Q D  CPT CODE: 40981

## 2012-12-09 ENCOUNTER — Telehealth: Payer: Self-pay | Admitting: Neurology

## 2012-12-09 NOTE — Telephone Encounter (Signed)
He believably states that this doesn't happen. He has been seen at irregular times , not corresponding to his Sinemet response times, when muscle tone is better to exercise.  There are ongoing problems with medication times.  He needs ROM daily, better twice daily- the stiffness increases his pain.  Kaymen Adrian, MD

## 2012-12-09 NOTE — Telephone Encounter (Signed)
Patients wife left message that the doctor recommended PT and ST for the patient, but he is at [facility].  I called [facility] and spoke to [place], Armed forces technical officer, and the patient is getting "restorative nursing" at the facility which is what the doctor recommended in her referral.  She will check on his swallowing ability and if necessary can put in for a diet change.

## 2012-12-13 NOTE — Telephone Encounter (Signed)
Left message for patient's wife that it may be best for her to speak to [name], and relay what the doctor recommends in regards to his ROM exercises.  Also not sure what protocols need to be followed because he is a hospice patient.  Told her to let me know if there is anything else we can do.

## 2012-12-20 ENCOUNTER — Telehealth: Payer: Self-pay | Admitting: Neurology

## 2012-12-20 NOTE — Telephone Encounter (Signed)
Patient's wife left messsage to thank [place] and to let doctor know that the facility is going to start with exercises 3 times a week starting next week.  She also mentioned that it would help if someone could watch him eat at times and make sure he is not having difficulty.

## 2013-01-25 ENCOUNTER — Other Ambulatory Visit: Payer: Self-pay | Admitting: *Deleted

## 2013-01-25 MED ORDER — HYDROCODONE-ACETAMINOPHEN 5-325 MG PO TABS: ORAL_TABLET | ORAL | Status: DC

## 2013-02-02 ENCOUNTER — Non-Acute Institutional Stay (SKILLED_NURSING_FACILITY): Payer: Medicare Other | Admitting: Internal Medicine

## 2013-02-02 ENCOUNTER — Encounter: Payer: Self-pay | Admitting: Internal Medicine

## 2013-02-02 DIAGNOSIS — D649 Anemia, unspecified: Secondary | ICD-10-CM

## 2013-02-02 DIAGNOSIS — K219 Gastro-esophageal reflux disease without esophagitis: Secondary | ICD-10-CM

## 2013-02-02 DIAGNOSIS — G20A1 Parkinson's disease without dyskinesia, without mention of fluctuations: Secondary | ICD-10-CM

## 2013-02-02 DIAGNOSIS — I951 Orthostatic hypotension: Secondary | ICD-10-CM

## 2013-02-02 DIAGNOSIS — G20 Parkinson's disease: Secondary | ICD-10-CM

## 2013-02-02 NOTE — Addendum Note (Signed)
Addended by: Angela Cox on: 02/02/2013 08:17 PM   Modules accepted: Level of Service

## 2013-02-02 NOTE — Progress Notes (Signed)
PROGRESS NOTE DATE: 02/02/13  FACILITY: [facility]  VISIT TYPE: Routine  CHIEF COMPLAINT:  Manage PD & orthostatic hypotension  HISTORY OF PRESENT ILLNESS:  REASSESSMENT OF ONGOING PROBLEMS:  PARKINSON'S DISEASE: pt's PD is stable.  Denies progression of sx recently.  Pt is tolerating PD meds without any complications.   Pt is now hospice.  ORTHOSTATIC HYPOTENSION: The orthostatcic hypotension remains stable.  Pt is tolerating current meds without any adverse reactions.  No dizziness or lightheadedness reported.   PAST MEDICAL HISTORY : Reviewed.  No changes.  CURRENT MEDICATIONS: Reviewed per [facility]  REVIEW OF SYSTEMS: ROS is unobtainable due to pt being a poor historian  PHYSICAL EXAMINATION  VS:  T 98.2    P 64    RR 20   BP 124/66    POX % 98         GENERAL: no acute distress, normal body habitus NECK: supple, trachea midline, no neck masses, no thyroid tenderness, no thyromegaly RESPIRATORY: breathing is even & unlabored, BS CTAB CARDIAC: RRR, no murmur,no extra heart sounds, +1 bilateral lower extremity edema GI: abdomen soft, normal BS, no masses, no tenderness, no hepatomegaly, no splenomegaly PSYCHIATRIC: the patient is alert & oriented to person, affect & behavior appropriate  LABS/RADIOLOGY:  6/14 hemoglobin 12.8, MCV 91.3 otherwise CBC normal, glucose 102, BUN 24, creatinine 1.3 otherwise CMP normal  3/14 Hb 12.6, mcv 87.9 ow cbc nl 12/13 bun 25, cr 1.42 ow cmp nl  ASSESSMENT/PLAN:  PD-stable.  Advanced.  On hospice. Orthostatic hypotension- stable. anemia- stable. GERD-well controlled. Constipation-no complaints OA- pain medications increased Anxiety-stable We did not do further labs due to hospice status  CPT CODE: 40981

## 2013-02-28 ENCOUNTER — Other Ambulatory Visit: Payer: Self-pay | Admitting: *Deleted

## 2013-02-28 MED ORDER — LORAZEPAM 0.5 MG PO TABS: ORAL_TABLET | ORAL | Status: DC

## 2013-03-07 ENCOUNTER — Non-Acute Institutional Stay (SKILLED_NURSING_FACILITY): Payer: Medicare Other | Admitting: Adult Health

## 2013-03-07 DIAGNOSIS — G4700 Insomnia, unspecified: Secondary | ICD-10-CM

## 2013-03-07 DIAGNOSIS — F419 Anxiety disorder, unspecified: Secondary | ICD-10-CM

## 2013-03-07 DIAGNOSIS — I951 Orthostatic hypotension: Secondary | ICD-10-CM

## 2013-03-07 DIAGNOSIS — K5900 Constipation, unspecified: Secondary | ICD-10-CM

## 2013-03-07 DIAGNOSIS — F411 Generalized anxiety disorder: Secondary | ICD-10-CM

## 2013-03-07 DIAGNOSIS — F329 Major depressive disorder, single episode, unspecified: Secondary | ICD-10-CM

## 2013-03-07 DIAGNOSIS — G20 Parkinson's disease: Secondary | ICD-10-CM

## 2013-03-07 DIAGNOSIS — K219 Gastro-esophageal reflux disease without esophagitis: Secondary | ICD-10-CM

## 2013-03-24 ENCOUNTER — Ambulatory Visit (INDEPENDENT_AMBULATORY_CARE_PROVIDER_SITE_OTHER): Payer: Medicare Other | Admitting: Cardiovascular Disease

## 2013-03-24 ENCOUNTER — Encounter: Payer: Self-pay | Admitting: Cardiovascular Disease

## 2013-03-24 VITALS — BP 146/96 | HR 64 | Ht 73.0 in | Wt 217.0 lb

## 2013-03-24 DIAGNOSIS — I10 Essential (primary) hypertension: Secondary | ICD-10-CM

## 2013-03-24 DIAGNOSIS — I2510 Atherosclerotic heart disease of native coronary artery without angina pectoris: Secondary | ICD-10-CM

## 2013-03-24 DIAGNOSIS — I4891 Unspecified atrial fibrillation: Secondary | ICD-10-CM

## 2013-03-24 NOTE — Progress Notes (Signed)
History of Present Illness: 80 yo WM with history of CAD, Parkinsons, GI bleeding, HLD, HTN and paroxysmal atrial fibrillation here today for cardiac follow up. He has been followed in the past by Dr. Olevia Perches. 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 He had a Taxus drug eluting stent placed in the mid LAD in 2005 and had an atherectomy procedure with [facility] of the diagonal branch at the same time per Dr. Olevia Perches. His past history is also significant for Parkinson's and he has a brain stimulator for this and is followed by Dr. Erling Cruz. He also has had toe amputations for osteomyelitis. He has a history of a GI bleed and had to stop his Plavix for this.He had some postural hypotension then and was put on midodrine. I ordered an echo which was overall normal on 03/24/11. His deep brain stimulator has been removed secondary to battery infection.  He is here for follow up. He has been doing well. He has had some symptoms of intermittent shortness of breath not related to exertion. This is at baseline and mostly when he is anxious. He's had no chest pain or palpitations. He is on Hospice now.   Primary Care Physician: Carolann Littler  Last Lipid Profile:Lipid Panel     Component Value Date/Time   CHOL 114 05/21/2011 0956   TRIG 69.0 05/21/2011 0956   HDL 47.10 05/21/2011 0956   CHOLHDL 2 05/21/2011 0956   VLDL 13.8 05/21/2011 0956   LDLCALC 53 05/21/2011 0956     Past Medical History  Diagnosis Date  . HYPERLIPIDEMIA-MIXED 02/10/2008  . PARKINSON'S DISEASE 02/10/2008  . HYPERTENSION, BENIGN 02/09/2009  . GASTROINTESTINAL HEMORRHAGE, HX OF 02/10/2008  . ANEMIA 04/29/2010  . FIBRILLATION, ATRIAL 04/29/2010  . VENTRAL HERNIA 04/29/2010  . DEGENERATIVE JOINT DISEASE 04/29/2010  . Gastric ulcer   . Positive PPD   . Internal hemorrhoids   . Hyperplastic colon polyp 2004  . CAD (coronary artery disease)     23.0 x 24 mm Taxus DES mid LAD 2005, [place] diagonal branch 2005  . VENOUS INSUFFICIENCY 02/10/2008   rt  . Shortness of breath   . Osteomyelitis   . Calculus of kidney   . Tuberculosis     80 yrs old  . GERD (gastroesophageal reflux disease)     ulcers hx  . Infection and inflammatory reaction due to device, implant, and graft   . Neuromuscular disorder   . Sundowning     past lumbar decompressive surgery  . Chronic kidney disease (CKD), stage III (moderate)   . Blood transfusion without reported diagnosis   . Dyslipidemia   . Parkinson disease   . GI bleed   . Coronary artery disease   . PPD positive     internal hemorrhoids    Past Surgical History  Procedure Laterality Date  . Replacement total knee  2002    right  . Nose surgery    . Ankle surgery      left  . Toe amputation      right foot  . Cataract extraction    . Carpal tunnel release      left, open rt  . Tonsillectomy    . Appendectomy    . Coronary angioplasty with stent placement    . Back surgery  2006     back x3  . Deep brain stimulator placement  02    batteries changed 07  . Subthalamic stimulator battery replacement  02/27/2011    Procedure:  SUBTHALAMIC STIMULATOR BATTERY REPLACEMENT;  Surgeon: Peggyann Shoals, MD;  Location: [place] [facility];  Service: Neurosurgery;  Laterality: Left;  DBS battery replacment x 2 - Left Chest and Abdomen  . Eye muscle surgery      bilat  . Subthalamic stimulator removal  05/22/2011    Procedure: SUBTHALAMIC STIMULATOR REMOVAL;  Surgeon: Erline Levine, MD;  Location: [place] [facility];  Service: Neurosurgery;  Laterality: N/A;  removal of IPG  . Subthalamic stimulator removal  07/24/2011    Procedure: SUBTHALAMIC STIMULATOR REMOVAL;  Surgeon: Erline Levine, MD;  Location: [place] [facility]UR[facility] ORS;  Service: Neurosurgery;  Laterality: N/A;  Deep Brain stimulator hardware removal  . Foot amputation through metatarsal      right    Current Outpatient Prescriptions  Medication Sig Dispense Refill  . acetaminophen (TYLENOL) 325 MG tablet TAKE 1 TABLET IN THE EVENING DAILY      . amantadine  (SYMMETREL) 100 MG capsule Take 100 mg by mouth 2 (two) times daily. Vitamin C,daily      . aspirin EC 81 MG tablet Take 81 mg by mouth daily.      . carbidopa-levodopa (SINEMET CR) 50-200 MG per tablet 1 tablet  At 6 AM, 9 AM, 12 noon, 1500 hours, 1800 hours and 2100 hours.      Marland Kitchen HYDROcodone-acetaminophen (NORCO/VICODIN) 5-325 MG per tablet Take one tablet by mouth three times daily; Take one tablet by mouth every 6 hours as needed for mild to moderate pain; Take two tablets by mouth every 6 hours as needed for severe pain  360 tablet  0  . Ipratropium-Albuterol (DUONEB IN) Inhale into the lungs as needed.      Marland Kitchen LORazepam (ATIVAN) 0.5 MG tablet Take 1/2 tablet by mouth twice daily for anxiety  30 tablet  5  . midodrine (PROAMATINE) 5 MG tablet Take 5 mg by mouth 2 (two) times daily.       . pantoprazole (PROTONIX) 40 MG tablet       . PARoxetine (PAXIL) 20 MG tablet Take 1 tablet (20 mg total) by mouth every morning.  30 tablet  11  . polyethylene glycol (MIRALAX / GLYCOLAX) packet Take 17 g by mouth daily. In 8 ounces daily      . pramipexole (MIRAPEX) 0.5 MG tablet Take 0.5 mg by mouth 5 (five) times daily.      Marland Kitchen pyridOXINE (VITAMIN B-6) 50 MG tablet Take 50 mg by mouth daily.      Orlie Dakin Sodium (SENOKOT S PO) Take by mouth at bedtime.      Marland Kitchen zolpidem (AMBIEN) 5 MG tablet Take 1 tablet (5 mg total) by mouth at bedtime as needed for sleep. TAKE 1 TABLET A DAY AT BEDTIME  30 tablet  5  . ipratropium (ATROVENT) 0.02 % nebulizer solution Take 500 mcg by nebulization 4 (four) times daily. For shortness of breath      . [DISCONTINUED] clonazePAM (KLONOPIN) 0.5 MG tablet Take 0.5 mg by mouth at bedtime as needed.       No current facility-administered medications for this visit.    Allergies  Allergen Reactions  . Morphine Other (See Comments)    unknown  . Nitroglycerin Other (See Comments)    unknown  . Risperidone And Related Other (See Comments)    unknown  . Tuberculin  Tests Other (See Comments)    unknown  . Vancomycin Rash    History   Social History  . Marital Status: Married    Spouse  Name: [name]    Number of Children: 4  . Years of Education: PHD   Occupational History  . retired Education officer, museum professor    Social History Main Topics  . Smoking status: Never Smoker   . Smokeless tobacco: Never Used  . Alcohol Use: No  . Drug Use: No  . Sexual Activity: No   Other Topics Concern  . Not on file   Social History Narrative  . No narrative on file    Family History  Problem Relation Age of Onset  . Parkinsonism Sister   . Diabetes Brother   . Stroke Brother   . Crohn's disease Daughter   . Heart disease Paternal Grandfather   . Colon cancer Neg Hx     Review of Systems:  As stated in the HPI and otherwise negative.   BP 146/96  Pulse 64  Ht 6\' 1"  (1.854 m)  Wt 217 lb (98.431 kg)  BMI 28.64 kg/m2  Physical Examination: General: Well developed, well nourished, NAD HEENT: OP clear, mucus membranes moist SKIN: warm, dry. No rashes. Neuro: No focal deficits Musculoskeletal: Muscle strength 5/5 all ext Psychiatric: Mood and affect normal Neck: No JVD, no carotid bruits, no thyromegaly, no lymphadenopathy. Lungs:Clear bilaterally, no wheezes, rhonci, crackles Cardiovascular: Regular rate and rhythm. No murmurs, gallops or rubs. Abdomen:Soft. Bowel sounds present. Non-tender.  Extremities: No lower extremity edema. Pulses are 2 + in the bilateral DP/PT.  EKG: Sinus, rate 62 bpm. 1st degree AV block. Poor R wave progression precordial leads.   Assessment and Plan:   1. CAD: Stable. Continue ASA. He is on Hospice. No other cardiac meds.   2. HTN: BP well controlled. He has orthostatic hypotension on midodrine so this BP is acceptable. No changes.   3. Paroxysmal atrial fibrillation: Sinus today. Not on anti-coagulation secondary to prior GI bleeding.

## 2013-03-24 NOTE — Patient Instructions (Signed)
Your physician wants you to follow-up in:  12 months.  You will receive a reminder letter in the mail two months in advance. If you don't receive a letter, please call our office to schedule the follow-up appointment.   

## 2013-03-28 ENCOUNTER — Other Ambulatory Visit: Payer: Self-pay | Admitting: *Deleted

## 2013-03-28 MED ORDER — ZOLPIDEM TARTRATE 5 MG PO TABS: ORAL_TABLET | ORAL | Status: DC

## 2013-04-01 ENCOUNTER — Telehealth: Payer: Self-pay | Admitting: Neurology

## 2013-04-01 NOTE — Telephone Encounter (Signed)
HAS APPT IN FEBRUARY--NEEDS SOON APPT--PARKINSON HAS PROGRESSED

## 2013-04-01 NOTE — Telephone Encounter (Signed)
i have unfortunately no appointments open , donna can you find out if they would accept a NP visit. ?

## 2013-04-01 NOTE — Telephone Encounter (Signed)
Spoke with wife and she said that there had been significant changes in patient, has a hospice nurse, for the last week has been very tired(going to bed at 6:00,hallucinations. Patient has been scheduled for a sooner appt./ confirmed. Wife wanted to speak with physician about her concerns.

## 2013-04-04 ENCOUNTER — Other Ambulatory Visit: Payer: Self-pay | Admitting: *Deleted

## 2013-04-04 MED ORDER — HYDROCODONE-ACETAMINOPHEN 5-325 MG PO TABS: ORAL_TABLET | ORAL | Status: DC

## 2013-04-04 NOTE — Telephone Encounter (Signed)
Left message with patient's wife to see if they would agree to see a NP, since the doctor has nothing available.

## 2013-04-05 ENCOUNTER — Non-Acute Institutional Stay (SKILLED_NURSING_FACILITY): Payer: Medicare Other | Admitting: Adult Health

## 2013-04-05 DIAGNOSIS — I951 Orthostatic hypotension: Secondary | ICD-10-CM

## 2013-04-05 DIAGNOSIS — F411 Generalized anxiety disorder: Secondary | ICD-10-CM

## 2013-04-05 DIAGNOSIS — F419 Anxiety disorder, unspecified: Secondary | ICD-10-CM

## 2013-04-05 DIAGNOSIS — K219 Gastro-esophageal reflux disease without esophagitis: Secondary | ICD-10-CM

## 2013-04-05 DIAGNOSIS — F3289 Other specified depressive episodes: Secondary | ICD-10-CM

## 2013-04-05 DIAGNOSIS — G20 Parkinson's disease: Secondary | ICD-10-CM

## 2013-04-05 DIAGNOSIS — G4700 Insomnia, unspecified: Secondary | ICD-10-CM

## 2013-04-05 DIAGNOSIS — F32A Depression, unspecified: Secondary | ICD-10-CM

## 2013-04-05 DIAGNOSIS — G20A1 Parkinson's disease without dyskinesia, without mention of fluctuations: Secondary | ICD-10-CM

## 2013-04-05 DIAGNOSIS — K5900 Constipation, unspecified: Secondary | ICD-10-CM

## 2013-04-05 DIAGNOSIS — F329 Major depressive disorder, single episode, unspecified: Secondary | ICD-10-CM

## 2013-04-05 NOTE — Progress Notes (Signed)
Patient ID: [name], male   DOB: Oct 12, 1929, 80 y.o.   MRN: [id number]        PROGRESS NOTE DATE: 03/07/13  FACILITY: [facility]  VISIT TYPE: Routine  CHIEF COMPLAINT:  Manage PD, Depression & orthostatic hypotension  HISTORY OF PRESENT ILLNESS:  REASSESSMENT OF ONGOING PROBLEMS:  DEPRESSION: The depression remains stable. Patient denies ongoing feelings of sadness, insomnia, anedhonia or lack of appetite. No complications reported from the medications currently being used. Staff do not report behavioral problems.  ANXIETY: The anxiety remains stable. Patient denies ongoing anxiety or irritability. No complications reported from the medications currently being used.  ORTHOSTATIC HYPOTENSION: The orthostatcic hypotension remains stable.  Pt is tolerating current meds without any adverse reactions.  No dizziness or lightheadedness reported.   PAST MEDICAL HISTORY : Reviewed.  No changes.  CURRENT MEDICATIONS: Reviewed per [facility]  REVIEW OF SYSTEMS: ROS is unobtainable due to pt being a poor historian  PHYSICAL EXAMINATION  VS:  T 97.4   P 66    RR 18  BP 116/70 to    WT 215.4 LBS        GENERAL: no acute distress, normal body habitus LYMPHATICS: no LAN in the neck, no supraclavicular LAN NECK: supple, trachea midline, no neck masses, no thyroid tenderness, no thyromegaly RESPIRATORY: breathing is even & unlabored, BS CTAB CARDIAC: RRR, no murmur,no extra heart sounds, +1 bilateral lower extremity edema GI: abdomen soft, normal BS, no masses, no tenderness, no hepatomegaly, no splenomegaly PSYCHIATRIC: the patient is alert & oriented to person, affect & behavior appropriate  LABS/RADIOLOGY: 6/14 hemoglobin 12.8, MCV 91.3 otherwise CBC normal, glucose 102, BUN 24, creatinine 1.3 otherwise CMP normal 3/14 Hb 12.6, mcv 87.9 ow cbc nl 12/13 bun 25, cr 1.42 ow cmp nl  ASSESSMENT/PLAN:  PD-stable.  Advanced.  On hospice. Orthostatic hypotension- stable. GERD-well  controlled. Constipation-no complaints Anxiety-stable Depression - stable; continue Paxil Insomnia - continue Ambien We did not do further labs due to hospice status   CPT CODE: 53614

## 2013-04-05 NOTE — Progress Notes (Signed)
Patient ID: [name], male   DOB: Sep 20, 1929, 80 y.o.   MRN: [id number]        PROGRESS NOTE DATE: 04/05/13  FACILITY: [facility]  VISIT TYPE: Routine  CHIEF COMPLAINT:  Manage PD, Depression & orthostatic hypotension  HISTORY OF PRESENT ILLNESS:  REASSESSMENT OF ONGOING PROBLEMS:  PARKINSON'S DISEASE: pt's Parkinson's disease is stable.  Denies progression of sx recently.  Pt is tolerating Parkinson's disease medications without any complications.  GERD: pt's GERD is stable.  Denies ongoing heartburn, abd. Pain, nausea or vomiting.  Currently on a PPI & tolerates it without any adverse reactions.  INSOMNIA: The insomnia remains stable.  No complications noted from the medications presently being used. Patient denies ongoing insomnia, pain, hallucinations, delusions.  PAST MEDICAL HISTORY : Reviewed.  No changes.  CURRENT MEDICATIONS: Reviewed per [facility]  REVIEW OF SYSTEMS: ROS is unobtainable due to pt being a poor historian  PHYSICAL EXAMINATION  VS:  T 98.8  P 84   RR 20  BP 112/72    WT 217.4 LBS        GENERAL: no acute distress, normal body habitus EYES: Conjunctivae normal, sclerae normal, normal eyelids LYMPHATICS: no LAN in the neck, no supraclavicular LAN NECK: supple, trachea midline, no neck masses, no thyroid tenderness, no thyromegaly RESPIRATORY: breathing is even & unlabored, BS CTAB CARDIAC: RRR, no murmur,no extra heart sounds, +1 bilateral lower extremity edema GI: abdomen soft, normal BS, no masses, no tenderness, no hepatomegaly, no splenomegaly PSYCHIATRIC: the patient is alert & oriented to person, affect & behavior appropriate  LABS/RADIOLOGY: 6/14 hemoglobin 12.8, MCV 91.3 otherwise CBC normal, glucose 102, BUN 24, creatinine 1.3 otherwise CMP normal 3/14 Hb 12.6, mcv 87.9 ow cbc nl 12/13 bun 25, cr 1.42 ow cmp nl  ASSESSMENT/PLAN:  PD-stable.  Advanced.  On hospice. Orthostatic hypotension- stable. GERD-well controlled. Constipation-no  complaints Anxiety-stable Depression - stable; continue Paxil Insomnia - continue Ambien We did not do further labs due to hospice status   CPT CODE: 93790

## 2013-04-05 NOTE — Telephone Encounter (Signed)
Patient has been scheduled for 04-20-13, wife is aware.

## 2013-04-20 ENCOUNTER — Ambulatory Visit (INDEPENDENT_AMBULATORY_CARE_PROVIDER_SITE_OTHER): Admitting: Neurology

## 2013-04-20 ENCOUNTER — Encounter: Payer: Self-pay | Admitting: Neurology

## 2013-04-20 ENCOUNTER — Ambulatory Visit: Payer: Self-pay | Admitting: Neurology

## 2013-04-20 VITALS — BP 143/81 | HR 59

## 2013-04-20 DIAGNOSIS — I2510 Atherosclerotic heart disease of native coronary artery without angina pectoris: Secondary | ICD-10-CM

## 2013-04-20 DIAGNOSIS — G903 Multi-system degeneration of the autonomic nervous system: Secondary | ICD-10-CM

## 2013-04-20 DIAGNOSIS — G238 Other specified degenerative diseases of basal ganglia: Secondary | ICD-10-CM

## 2013-04-20 DIAGNOSIS — G20 Parkinson's disease: Secondary | ICD-10-CM | POA: Insufficient documentation

## 2013-04-20 MED ORDER — CARBIDOPA-LEVODOPA 25-250 MG PO TABS: 1.0000 | ORAL_TABLET | Freq: Every day | ORAL | Status: AC

## 2013-04-20 MED ORDER — CARBIDOPA-LEVODOPA 25-250 MG PO TABS: 1.0000 | ORAL_TABLET | Freq: Every day | ORAL | Status: DC

## 2013-04-20 NOTE — Patient Instructions (Signed)
Dysautonomia  In PD , status post removal of DBS.

## 2013-04-20 NOTE — Progress Notes (Signed)
Chief Complaint  Patient presents with  . Follow-up    Room 11  . 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   History resides at [facility], and has had increased drooling, and orthostatic hypotension results. It seems that he has developed a complement of autonomic system atrophy, which is partially addressed by Midrin but not sufficiently so. His hypophonia has gotten worse, he seems to have a good skin turgor.  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 His facial skin shows no lesions he has a small you out on the left angle of the mouth his blink reflex is actually been less infrequent, his sunken skull defects from the former  DBS stimulator placement are visible.   His legs have lost further muscle bulk. He no longer transfers or ambulates.    Last note : PD.  Dr. [place] established patient is Parkinson's disease, has originally been followed in this practice by Dr. love. His diagnosis was established in 1987 and he was on multiple medication regimens before he finally had a deep brain stimulator implanted bilaterally in 2002 with.  This was while he was living near [place], [place]. He had one Battery change in [id number] without complications,  After a battery change for the DBS in 2012 he caught a rare mycotic and infection and his devices had to be removed in March 2013 .  Since then he can only rely on medication for the treatment of Parkinson's disease and has been over a year now with old food the brain stimulator. He has been unable to transfer he has tolerated increased doses of Sinemet and Mirapex as well as more frequent intake times. The problem appears to be the  nursing home setting,  in which the medications are not given on time- nor is the patient permitted to take them for himself /keep them at the  bedside.  Hospice has been seeing him weekly but is not responsible for the medications.  He remains wheelchair bound,  increasingly stiff and and his posture even seated has changed over the last 2 months he has much more neck rigidity and developed the hump. His pain but arises from the shoulders and neck has been responding to Voltaren ( as of  December 2013 ).  The patient has increasing dysphonia, and problems to handle saliva. His speech is more dysphonic and non-fluent than in Dec 2013 .  In Dec 2013 he was accepted by hospice to be followed.  He remains at [facility].  He has dependent edema in his left hand this skin discoloration but could be amantadine i. skin lesions that seem to be slowly healing. There is a bleeding scratch on the on the shin right leg.  Specific Symptoms:  Tremor: yes, persistent on the R, and intermittent on the L  Voice: speech fluctuates, with hypophonic speech and dysarthria  Sleep: sleeps well initially, but trouble maintaining sleep  Vivid Dreams: no  Acting out dreams: yes (rarely)  Wet Pillows: yes (problematic. Trouble catching saliva)  Postural symptoms: yes  Falls? no  Bradykinesia symptoms: he can barely transfer, no longer any gait, slow movements, Rigidity both legs - slurred or difficult speech, drooling while awake . Loss of smell: yes  Loss of taste: yes  Urinary Incontinence: yes. Uses  depends.  Difficulty Swallowing: no (now food is mechanical soft and that has helped).  Handwriting, micrographia: yes  Trouble with ADL's: yes  Trouble buttoning clothing: yes  Memory changes: no per wife, only if startled awake out of sleep  Hallucinations: yes  visual distortions: yes - continued .  N/V: yes  Lightheaded: yes (rarely)  Syncope: no  Diplopia: yes, started after battery change in 2007, and had an eye surgery with no help  Dyskinesia: none  Sinemet 50/200 to be given at 6 AM, 9 AM, 12 noon, 1500 hours, 1800 hours and one full tablet  at 2100 hours 9 PM.  Mirapex a half milligram tablets 5 times daily at 6 AM 10 AM 2 PM 6 PM and 9 pm.  Amantadine 100 capsules one in the morning and one at noon  Midrin 5 mg in the morning and at noon.  EXAM :   Cranial nerves: There is good facial symmetry. There is facial hypomima. Pupils are equal round and reactive to light bilaterally. Floaters in both eyes, more in the left- ptosis in the left.  Fundoscopic exam reveals clear margins bilaterally.  There is R exotropia. The visual fields are full to confrontational testing.  The speech is fluent, hypophonic and mildly dysarthric. Soft palate has limited Rise - there is no tongue deviation. tremor of the tongue.   Hearing is intact to conversational tone.  Sensation: Sensation is intact to light and pinprick throughout (facial, trunk, extremities). Vibration is markedly decreased distally. There is no extinction with double simultaneous stimulation. There is no sensory dermatomal level identified.  Motor: Strength is 5/5 in the bilateral upper and lower extremities.  He has pain with L shoulder abduction, making manual motor testing difficult.  Shoulder shrug  Reduced on the left- painful ROM limitation to the left shoulder .  There is no pronator drift.  Left knee  Stiffness worse than right , unable to flex either  knee and move either foot , dorsiflexion weakness.  Deep tendon reflexes: Deep tendon reflexes are 0/4 at the bilateral biceps, triceps, brachioradialis, patella and achilles. Plantar responses are downgoing bilaterally.  Movement examination:  Tone: There is markedly increased tone in the right upper extremity. Tone in the left upper extremity is normal, even with activation procedures.  Abnormal movements: There is a moderate right upper extremity resting tremor. There is an intermittent left upper extremity resting tremor. There is a chin tremor.   Coordination: There is decremation with RAM's, with all forms of rapid  outlining movements including hand opening and closing, finger taps and alternation of supination and pronation.   Gait and Station: The patient is unable to  arising out of a chair even with maximal assistance.   He requires maximum assistance to stand and has been unable to walk. The left foot sticks to the ground.        Assessment:   Advanced stage of Parkinson's disease in the late stages without major cognitive impairment.  Related or secondary to condition #1 : increasing rigidity, restricted ROM,  inability to ambulate, pain, progressive postural changes that affect this patient's primary axial skeleton .  The patient has dysphonia and dysphagia. Solid food is most difficult to swallow. He can handle pureed and liquids.  He increased his fluid intake.   Due to his non-ambulatory state he has also lost muscle mass in the upper extremities, which  can be attributed to his cervical spinal stenosis.  There was for at least 1 year  now  atrophy of the lower extremities.  He has stasis related skin changes in the left arm and hand and both legs.    Plan is to arrange for daily exercise - reduction in rigidity.   this can be improved with  passive range of motion / isometric Exercises.  Hospice has tried to arrange for some exercises especially walking with the assistance of a certified nursing assistant, but this does not regularly take place. Step Counts at ambulation should be made .ST to evaluate swallowing.  Patient will require turning every 3-4 hours at night , to avoid bedsores.  The patient spends all day seated and had in his wheelchair and then about 10 hours in the bed at night.  I will ask Dr Janann Colonel to evaluate this patient for silaorhea or/ and cervical shoulder pain ?  dystonia with PD/ [facility]. I will increase his medication to 5 times daily , and hope he can be accomodated with exact intake times.

## 2013-04-29 ENCOUNTER — Telehealth: Payer: Self-pay | Admitting: Neurology

## 2013-04-29 DIAGNOSIS — G20A1 Parkinson's disease without dyskinesia, without mention of fluctuations: Secondary | ICD-10-CM

## 2013-04-29 DIAGNOSIS — G20 Parkinson's disease: Secondary | ICD-10-CM

## 2013-04-29 NOTE — Telephone Encounter (Signed)
I called back and spoke with Ms Boylen.  She said the patient has increased back pain and she would like to discuss a possible dose change in meds to help him with this.  Says perhaps his bed at [facility] place could be part of the issue, but they adjust it the best they can to keep him comfortable.  She spoke with a nurse there and is concerned about ensuring he is not overmedicated, afraid it would cause him to be lethargic.  Please advise.  Thank you.

## 2013-04-29 NOTE — Telephone Encounter (Signed)
Pt's wife, [name], called.  She stated that when they saw Dr. Brett Fairy on the 11th she made some adjustments to [name]'s medications.  She also said that now her husband's back is hurting quite a bit and he is not feeling quite as well.  She would like someone to call her and discuss the changes that were made and what can be done to make him feel more comfortable.  Thank you

## 2013-05-02 MED ORDER — AMANTADINE HCL 100 MG PO CAPS: 100.0000 mg | ORAL_CAPSULE | Freq: Two times a day (BID) | ORAL | Status: DC

## 2013-05-02 NOTE — Telephone Encounter (Signed)
During his last visit on 04-20-13 , I have  changed his medication for the future- meaning once his current sinemet regimen is completed, and we we ill use immediate release instead of  XR forms.  The patient may benefit from an anti dystonia medication. Amantadine may be better as BID medication, and I refilled it as such, the prescription would go to Mrs Harriman and not hospice , i believe.

## 2013-05-03 NOTE — Telephone Encounter (Signed)
Spoke to Mrs Kant and she received a message from the doctor explaining the medication change.  We also talked about having him turned at night to prevent bedsores and help his back.  She will check with [facility] about the turning at night.

## 2013-05-04 ENCOUNTER — Ambulatory Visit: Payer: PRIVATE HEALTH INSURANCE | Admitting: Neurology

## 2013-05-05 ENCOUNTER — Encounter: Payer: Self-pay | Admitting: *Deleted

## 2013-05-20 ENCOUNTER — Encounter: Payer: Self-pay | Admitting: Adult Health

## 2013-05-20 ENCOUNTER — Non-Acute Institutional Stay (SKILLED_NURSING_FACILITY): Payer: Medicare Other | Admitting: Adult Health

## 2013-05-20 DIAGNOSIS — F419 Anxiety disorder, unspecified: Secondary | ICD-10-CM

## 2013-05-20 DIAGNOSIS — K219 Gastro-esophageal reflux disease without esophagitis: Secondary | ICD-10-CM

## 2013-05-20 DIAGNOSIS — F329 Major depressive disorder, single episode, unspecified: Secondary | ICD-10-CM

## 2013-05-20 DIAGNOSIS — G20A1 Parkinson's disease without dyskinesia, without mention of fluctuations: Secondary | ICD-10-CM

## 2013-05-20 DIAGNOSIS — F32A Depression, unspecified: Secondary | ICD-10-CM

## 2013-05-20 DIAGNOSIS — K5900 Constipation, unspecified: Secondary | ICD-10-CM

## 2013-05-20 DIAGNOSIS — G4700 Insomnia, unspecified: Secondary | ICD-10-CM

## 2013-05-20 DIAGNOSIS — I951 Orthostatic hypotension: Secondary | ICD-10-CM

## 2013-05-20 DIAGNOSIS — G20 Parkinson's disease: Secondary | ICD-10-CM

## 2013-05-20 DIAGNOSIS — F3289 Other specified depressive episodes: Secondary | ICD-10-CM

## 2013-05-20 DIAGNOSIS — F411 Generalized anxiety disorder: Secondary | ICD-10-CM

## 2013-05-20 NOTE — Progress Notes (Signed)
Patient ID: [name], male   DOB: 1929/06/25, 80 y.o.   MRN: [id number]          PROGRESS NOTE DATE: 05/20/13  FACILITY: [facility]  VISIT TYPE: Routine Visit  CHIEF COMPLAINT:  Manage PD, Depression, Esophageal Reflux & orthostatic hypotension  HISTORY OF PRESENT ILLNESS:  REASSESSMENT OF ONGOING PROBLEMS:  ORTHOSTATIC HYPOTENSION: The orthostatcic hypotension remains stable.  Pt is tolerating current medications without any adverse reactions.  No dizziness or lightheadedness reported. Last BP 108/64  DEPRESSION: The depression remains stable. Patient denies ongoing feelings of sadness, insomnia, anedhonia or lack of appetite. No complications reported from the medications currently being used. Staff do not report behavioral problems.  PARKINSON'S DISEASE: pt's Parkinson's disease is stable.  Denies progression of sx recently.  Pt is tolerating Parkinson's disease medications without any complications.   PAST MEDICAL HISTORY : Reviewed.  No changes.  CURRENT MEDICATIONS: Reviewed per [facility]  REVIEW OF SYSTEMS: ROS is unobtainable due to pt being a poor historian  PHYSICAL EXAMINATION  GENERAL: no acute distress, normal body habitus LYMPHATICS: no LAN in the neck, no supraclavicular LAN NECK: supple, trachea midline, no neck masses, no thyroid tenderness, no thyromegaly RESPIRATORY: breathing is even & unlabored, BS CTAB CARDIAC: RRR, no murmur,no extra heart sounds, +1 bilateral lower extremity edema GI: abdomen soft, normal BS, no masses, no tenderness, no hepatomegaly, no splenomegaly PSYCHIATRIC: the patient is alert & oriented to person, affect & behavior appropriate  LABS/RADIOLOGY: 05/12/13   WBC 7.5 hemoglobin 12.6 hematocrit 37.0 05/10/13   Uric acidacid 6.7 6/14      hemoglobin 12.8, MCV 91.3 otherwise CBC normal, glucose 102, BUN 24, creatinine 1.3 otherwise CMP normal 3/14 Hb 12.6, mcv 87.9 ow cbc nl 12/13 bun 25, cr 1.42 ow cmp  nl  ASSESSMENT/PLAN:  PD-stable.  Advanced.  On hospice. Orthostatic hypotension- stable. GERD-well controlled. Constipation-no complaints Anxiety-stable Depression - stable; continue Paxil Insomnia - continue Ambien    CPT CODE: 03500   Josselyn Harkins Vargas - NP [facility] [phone]

## 2013-05-23 ENCOUNTER — Other Ambulatory Visit: Payer: Self-pay | Admitting: *Deleted

## 2013-05-23 MED ORDER — ZOLPIDEM TARTRATE 5 MG PO TABS: ORAL_TABLET | ORAL | Status: DC

## 2013-05-23 NOTE — Telephone Encounter (Signed)
[facility] 

## 2013-06-27 ENCOUNTER — Non-Acute Institutional Stay (SKILLED_NURSING_FACILITY): Payer: Medicare Other | Admitting: Adult Health

## 2013-06-27 ENCOUNTER — Encounter: Payer: Self-pay | Admitting: Adult Health

## 2013-06-27 DIAGNOSIS — F419 Anxiety disorder, unspecified: Secondary | ICD-10-CM

## 2013-06-27 DIAGNOSIS — G4700 Insomnia, unspecified: Secondary | ICD-10-CM

## 2013-06-27 DIAGNOSIS — M1990 Unspecified osteoarthritis, unspecified site: Secondary | ICD-10-CM | POA: Insufficient documentation

## 2013-06-27 DIAGNOSIS — K219 Gastro-esophageal reflux disease without esophagitis: Secondary | ICD-10-CM

## 2013-06-27 DIAGNOSIS — G20 Parkinson's disease: Secondary | ICD-10-CM

## 2013-06-27 DIAGNOSIS — I951 Orthostatic hypotension: Secondary | ICD-10-CM

## 2013-06-27 DIAGNOSIS — F32A Depression, unspecified: Secondary | ICD-10-CM

## 2013-06-27 DIAGNOSIS — F411 Generalized anxiety disorder: Secondary | ICD-10-CM

## 2013-06-27 DIAGNOSIS — F3289 Other specified depressive episodes: Secondary | ICD-10-CM

## 2013-06-27 DIAGNOSIS — K5900 Constipation, unspecified: Secondary | ICD-10-CM

## 2013-06-27 DIAGNOSIS — F329 Major depressive disorder, single episode, unspecified: Secondary | ICD-10-CM

## 2013-06-27 NOTE — Progress Notes (Signed)
Patient ID: [name], male   DOB: March 14, 1929, 80 y.o.   MRN: [id number]          PROGRESS NOTE DATE: 06/27/13  FACILITY: [facility]  VISIT TYPE: Routine Visit  CHIEF COMPLAINT:  Manage PD, Depression, Esophageal Reflux & orthostatic hypotension  HISTORY OF PRESENT ILLNESS:  REASSESSMENT OF ONGOING PROBLEMS:  ORTHOSTATIC HYPOTENSION: The orthostatcic hypotension remains stable.  Pt is tolerating current medications without any adverse reactions.  No dizziness or lightheadedness reported. Last BP 126/86  ANXIETY: The anxiety remains stable. Patient denies ongoing anxiety or irritability. No complications reported from the medications currently being used.  GERD: pt's GERD is stable.  Denies ongoing heartburn, abd. Pain, nausea or vomiting.  Currently on a PPI & tolerates it without any adverse reactions.   PAST MEDICAL HISTORY : Reviewed.  No changes.  CURRENT MEDICATIONS: Reviewed per [facility]  REVIEW OF SYSTEMS: ROS is unobtainable due to pt being a poor historian  PHYSICAL EXAMINATION  GENERAL: no acute distress, normal body habitus NECK: supple, trachea midline, no neck masses, no thyroid tenderness, no thyromegaly RESPIRATORY: breathing is even & unlabored, BS CTAB CARDIAC: RRR, no murmur,no extra heart sounds, +1 bilateral lower extremity edema GI: abdomen soft, normal BS, no masses, no tenderness, no hepatomegaly, no splenomegaly PSYCHIATRIC: the patient is alert & oriented to person, affect & behavior appropriate  LABS/RADIOLOGY: 06/16/13 T PRO 7.0  ALB 3.8  GLOB 3.2  TBIL 0.5  DBIL 0.1 AST 23  ALT 6 05/12/13   WBC 7.5 hemoglobin 12.6 hematocrit 37.0 05/10/13   Uric acidacid 6.7 6/14      hemoglobin 12.8, MCV 91.3 otherwise CBC normal, glucose 102, BUN 24, creatinine 1.3 otherwise CMP normal 3/14 Hb 12.6, mcv 87.9 ow cbc nl 12/13 bun 25, cr 1.42 ow cmp nl  ASSESSMENT/PLAN:  PD-stable.  Advanced.  On hospice. Orthostatic hypotension- stable. GERD-well  controlled. Constipation-no complaints Anxiety-stable Depression - stable; continue Paxil Insomnia - continue Ambien Osteoarthritis - change Voltaren gel 1% 2 gm to left shoulder BID PRN    CPT CODE: 07371   Seth Bake - NP [facility] [phone]

## 2013-07-11 ENCOUNTER — Encounter: Payer: Self-pay | Admitting: Neurology

## 2013-07-11 ENCOUNTER — Encounter (INDEPENDENT_AMBULATORY_CARE_PROVIDER_SITE_OTHER): Payer: Self-pay

## 2013-07-11 ENCOUNTER — Ambulatory Visit (INDEPENDENT_AMBULATORY_CARE_PROVIDER_SITE_OTHER): Admitting: Neurology

## 2013-07-11 VITALS — BP 104/62

## 2013-07-11 DIAGNOSIS — G20 Parkinson's disease: Secondary | ICD-10-CM

## 2013-07-11 DIAGNOSIS — I2510 Atherosclerotic heart disease of native coronary artery without angina pectoris: Secondary | ICD-10-CM

## 2013-07-11 DIAGNOSIS — K117 Disturbances of salivary secretion: Secondary | ICD-10-CM

## 2013-07-11 DIAGNOSIS — G243 Spasmodic torticollis: Secondary | ICD-10-CM

## 2013-07-11 NOTE — Progress Notes (Signed)
[facility]    Provider:  Dr [name] Referring Provider: Eulas Post, MD Primary Care Physician:  Lance Post, MD  CC:  PD  HPI:  [name] is a 80 y.o. male here as a referral from Dr. Elease [name] for 2nd opinion of his PD.  Originally diagnosed with PD in 1987, had DBS placed in 2002, unfortunately caught a mycotic infection with battery change in 2012 and device had to be removed. Main concerns recently have been increased drooling and orthostatic hypotension. At last visit with Dr [name] the Sinemet 25-250 was increased to 5x a day at 6am, noon, 4pm, 8pm and midnight. They are unsure if he is getting the medication at these set times. He is also taking Mirapex 0.5mg  5x a day. He is currently taking the amantadine 100mg  twice a day. He is also currently taking midodrine 5mg  twice a day.   He is currently living in a nursing home and is wheelchair bound. His main concerns today are lack of exercise and rehab. He expresses a strong desire to ambulate more. His wife notes he has not been home or ambulating freely since March 5643 due to complications from the DBS infection. He is unsure if they notice any benefit from the Sinemet. They do notice more dyskinesias, especially since increasing the Sinemet to 5x a day. He does note frequent wearing off and freezing episodes. Dyskinesias have become bothersome. He notes some difficulty swallowing.   He notes an excessive amount of saliva production, causing some difficulty swallowing. Notes severe cervical neck pain. He notes some tilting of his head to the left and rotating to the right. Having painful spasms in bilateral trapezius. Causes difficulty with sleep due to the pain. Cannot tolerate muscle relaxants due to side effects.   Review of Systems: Out of a complete 14 system review, the patient complains of only the following symptoms, and all other reviewed systems are negative. + dyskinesias, fatigue, gait  instability, neck pain  History   Social History  . Marital Status: Married    Spouse Name: [name]    Number of Children: 4  . Years of Education: PHD   Occupational History  . retired Education officer, museum professor    Social History Main Topics  . Smoking status: Never Smoker   . Smokeless tobacco: Never Used  . Alcohol Use: No  . Drug Use: No  . Sexual Activity: No   Other Topics Concern  . Not on file   Social History Narrative   Patient is married [name]) and he lives at [facility].   Patient has three living children and one son is deceased.   Patient is retired.   Patient has a PhD.   Patient is right-handed.   Patient drinks coffee occasionally.    Family History  Problem Relation Age of Onset  . Parkinsonism Sister   . Diabetes Brother   . Stroke Brother   . Crohn's disease Daughter   . Heart disease Paternal Grandfather   . Colon cancer Neg Hx     Past Medical History  Diagnosis Date  . HYPERLIPIDEMIA-MIXED 02/10/2008  . PARKINSON'S DISEASE 02/10/2008  . HYPERTENSION, BENIGN 02/09/2009  . GASTROINTESTINAL HEMORRHAGE, HX OF 02/10/2008  . ANEMIA 04/29/2010  . FIBRILLATION, ATRIAL 04/29/2010  . VENTRAL HERNIA 04/29/2010  . DEGENERATIVE JOINT DISEASE 04/29/2010  . Gastric ulcer   . Positive PPD   . Internal hemorrhoids   . Hyperplastic colon polyp 2004  . CAD (coronary artery disease)  23.0 x 24 mm Taxus DES mid LAD 2005, [place] diagonal branch 2005  . VENOUS INSUFFICIENCY 02/10/2008    rt  . Shortness of breath   . Osteomyelitis   . Calculus of kidney   . Tuberculosis     80 yrs old  . GERD (gastroesophageal reflux disease)     ulcers hx  . Infection and inflammatory reaction due to device, implant, and graft   . Neuromuscular disorder   . Sundowning     past lumbar decompressive surgery  . Chronic kidney disease (CKD), stage III (moderate)   . Blood transfusion without reported diagnosis   . Dyslipidemia   . Parkinson disease   . GI bleed   . Coronary  artery disease   . PPD positive     internal hemorrhoids  . Depression   . Osteoporosis     Past Surgical History  Procedure Laterality Date  . Replacement total knee  2002    right  . Nose surgery    . Ankle surgery      left  . Toe amputation      right foot  . Cataract extraction    . Carpal tunnel release      left, open rt  . Tonsillectomy    . Appendectomy    . Coronary angioplasty with stent placement    . Back surgery  2006     back x3  . Deep brain stimulator placement  02    batteries changed 07  . Subthalamic stimulator battery replacement  02/27/2011    Procedure: SUBTHALAMIC STIMULATOR BATTERY REPLACEMENT;  Surgeon: Peggyann Shoals, MD;  Location: [place] [facility];  Service: Neurosurgery;  Laterality: Left;  DBS battery replacment x 2 - Left Chest and Abdomen  . Eye muscle surgery      bilat  . Subthalamic stimulator removal  05/22/2011    Procedure: SUBTHALAMIC STIMULATOR REMOVAL;  Surgeon: Erline Levine, MD;  Location: [place] [facility];  Service: Neurosurgery;  Laterality: N/A;  removal of IPG  . Subthalamic stimulator removal  07/24/2011    Procedure: SUBTHALAMIC STIMULATOR REMOVAL;  Surgeon: Erline Levine, MD;  Location: [place] [facility];  Service: Neurosurgery;  Laterality: N/A;  Deep Brain stimulator hardware removal  . Foot amputation through metatarsal      right  . Spine surgery      Current Outpatient Prescriptions  Medication Sig Dispense Refill  . acetaminophen (TYLENOL) 325 MG tablet Take 325 mg by mouth every 6 (six) hours as needed for mild pain or moderate pain. TAKE 1 TABLET IN THE EVENING DAILY      . albuterol (PROVENTIL) (2.5 MG/3ML) 0.083% nebulizer solution Take 3 mLs by nebulization every 6 (six) hours as needed for wheezing or shortness of breath.      Marland Kitchen amantadine (SYMMETREL) 100 MG capsule Take 1 capsule (100 mg total) by mouth 2 (two) times daily. Take in AM and at lunch time.  60 capsule  5  . carbidopa-levodopa (SINEMET) 25-250 MG per tablet Take 1  tablet by mouth 5 (five) times daily. The first dose in a.m. with orange juice,  continue giving medication every 4-5 hours after initial morning doors. Suggested are 8 AM, 12 noon, 4 PM, 8 PM and a late nighttime dose.  150 tablet  5  . dextromethorphan-guaiFENesin (DIABETIC TUSSIN DM) 10-100 MG/5ML liquid Take 10 mLs by mouth every 4 (four) hours as needed for cough.      . diclofenac sodium (VOLTAREN) 1 % GEL Apply  2 g topically 2 (two) times daily as needed.      Marland Kitchen HYDROcodone-acetaminophen (NORCO/VICODIN) 5-325 MG per tablet Take one tablet by mouth three times daily; Take one tablet by mouth every 6 hours as needed for mild to moderate pain; Take two tablets by mouth every 6 hours as needed for severe pain  360 tablet  0  . Ipratropium-Albuterol (DUONEB IN) Inhale into the lungs as needed.      Marland Kitchen LORazepam (ATIVAN) 0.5 MG tablet Take 1/2 tablet by mouth twice daily for anxiety  30 tablet  5  . midodrine (PROAMATINE) 5 MG tablet Take 5 mg by mouth 2 (two) times daily.       . pantoprazole (PROTONIX) 40 MG tablet Take 40 mg by mouth daily. For gastric ulcer      . PARoxetine (PAXIL) 20 MG tablet Take 20 mg by mouth every morning. For depression      . polyethylene glycol (MIRALAX / GLYCOLAX) packet Take 17 g by mouth daily. In 8 ounces daily      . pramipexole (MIRAPEX) 0.5 MG tablet Take 0.5 mg by mouth 5 (five) times daily.      Marland Kitchen pyridOXINE (VITAMIN B-6) 50 MG tablet Take 50 mg by mouth daily.      Orlie Dakin Sodium (SENOKOT S PO) Take by mouth at bedtime.      Marland Kitchen zolpidem (AMBIEN) 5 MG tablet Take one tablet by mouth every night at bedtime for rest  30 tablet  5  . aspirin EC 81 MG tablet Take 81 mg by mouth daily.      . [DISCONTINUED] clonazePAM (KLONOPIN) 0.5 MG tablet Take 0.5 mg by mouth at bedtime as needed.       No current facility-administered medications for this visit.    Allergies as of 07/11/2013 - Review Complete 07/11/2013  Allergen Reaction Noted  . Morphine  Other (See Comments) 04/29/2010  . Nitroglycerin Other (See Comments) 09/12/2011  . Risperidone and related Other (See Comments) 02/17/2011  . Tuberculin tests Other (See Comments) 06/27/2011  . Vancomycin Rash 05/27/2011    Vitals: BP 104/62 Last Weight:  Wt Readings from Last 1 Encounters:  06/27/13 212 lb 12.8 oz (96.525 kg)   Last Height:   Ht Readings from Last 1 Encounters:  03/24/13 6\' 1"  (1.854 m)     Physical exam: Exam: Gen: NAD, conversant Eyes: anicteric sclerae, moist conjunctivae HENT: Atraumatic, oropharynx clear Neck: Trachea midline; supple,  Lungs: CTA, no wheezing, rales, rhonic                          CV: RRR, no MRG Abdomen: Soft, non-tender;  Extremities: No peripheral edema  Skin: Normal temperature, no rash,  Psych: Appropriate affect, pleasant  Neuro: MS:  MMSE 23/30  Cranial nerves: There is good facial symmetry. There is facial hypomima. Pupils are equal round and reactive to light bilaterally. ptosis in the left.  The speech is fluent, hypophonic and mildly dysarthric. Soft palate has limited Rise - there is no tongue deviation. tremor of the tongue.  Hearing is intact to conversational tone.  Sensation: Sensation is intact to light and pinprick throughout (facial, trunk, extremities). Vibration is markedly decreased distally.   There is no sensory dermatomal level identified.   Motor: Strength is 5/5 in the bilateral upper and lower extremities.  Noted left laterocolis and right torticolis, tenderness to palpation in bilateral trapezius and left levator scapulae  Deep tendon reflexes: Deep  tendon reflexes are 0/4 at the bilateral biceps, triceps, brachioradialis, patella and achilles. Plantar responses are downgoing bilaterally.   Movement examination:  There is a moderate right upper extremity resting tremor. There is an intermittent left upper extremity resting tremor. There is a chin tremor. Near continuous dyskinesias of extremities >  trunk noted throughout the exam. Marked bradykinesia with finger taps, hand opening/closing  Gait and Station: The patient is unable to arising out of a chair even with maximal assistance.     Assessment:  After physical and neurologic examination, review of laboratory studies, imaging, neurophysiology testing and pre-existing records, assessment will be reviewed on the problem list.  Plan:  Treatment plan and additional workup will be reviewed under Problem List.  1)PD 2)Cervical dystonia 3)Sialorrhea 4)Cognitive decline  80y/o gentleman with PD presenting for 2nd opinion evaluation. Main concerns today are severe motor fluctuations/dyskinesias, cervical dystonia and sialorrhea. He had a DBS in the past but it was removed in 2012 due to infection during a battery change. Will change Sinemet 25-250 to 5x a day at 6am, 10am, 2pm, 6pm, 10pm. He appears to be an ideal candidate for Duopa/LCIG therapy due to his marked motor fluctuations. Will connect family with patient advocate for further information. Will place request for Botox injections for sialorrhea and cervical dystonia. May consider patient for Amantadine ER trial, will recheck MMSE at next visit. Follow up once approval granted for Botox.   Jim Like, DO  [facility] [address] [place], [place] [address]  Phone [phone] Fax [phone]

## 2013-07-11 NOTE — Patient Instructions (Signed)
Overall you are doing fairly well but I do want to suggest a few things today:   Remember to drink plenty of fluid, eat healthy meals and do not skip any meals. Try to eat protein with a every meal and eat a healthy snack such as fruit or nuts in between meals. Try to keep a regular sleep-wake schedule and try to exercise daily, particularly in the form of walking, 20-30 minutes a day, if you can.   As far as your medications are concerned, I would like to suggest you consider Duopa which is a new way to deliver a continuous dosing of levodopa. We will place a request and   As far as diagnostic testing:   I would like to see you back in XXXXX, sooner if we need to. Please call us with any interim questions, concerns, problems, updates or refill requests.   Please also call us for any test results so we can go over those with you on the phone.  My clinical assistant and will answer any of your questions and relay your messages to me and also relay most of my messages to you.   Our phone number is [phone]. We also have an after hours call service for urgent matters and there is a physician on-call for urgent questions. For any emergencies you know to call 911 or go to the nearest emergency room

## 2013-07-14 ENCOUNTER — Telehealth: Payer: Self-pay | Admitting: Neurology

## 2013-07-14 NOTE — Telephone Encounter (Signed)
FYI-I called this patient to schedule botox injections and his wife states she does not want to move forward right now. But will call back when she decides.

## 2013-07-19 ENCOUNTER — Ambulatory Visit (INDEPENDENT_AMBULATORY_CARE_PROVIDER_SITE_OTHER): Admitting: Neurology

## 2013-07-19 ENCOUNTER — Encounter: Payer: Self-pay | Admitting: Neurology

## 2013-07-19 VITALS — BP 138/88 | HR 62 | Resp 18

## 2013-07-19 DIAGNOSIS — I2510 Atherosclerotic heart disease of native coronary artery without angina pectoris: Secondary | ICD-10-CM

## 2013-07-19 DIAGNOSIS — G20 Parkinson's disease: Secondary | ICD-10-CM

## 2013-07-19 NOTE — Patient Instructions (Signed)
BOTOX and DUODOPA>

## 2013-07-19 NOTE — Progress Notes (Signed)
[facility]    Provider:  Dr Janann Colonel Referring Provider: Eulas Post, MD Primary Care Physician:  Eulas Post, MD  CC:  PD  HPI:  [name] is a 80 y.o. male here as a referral from Dr. Elease Hashimoto for 2nd opinion of his PD.  Originally diagnosed with PD in 1987, had DBS placed in [place] in  2002, unfortunately caught a mycotic infection with battery change to the device  in 2013 by Dr Vertell Limber  and device had to be removed. Main concerns recently have been increased drooling and orthostatic hypotension. At last visit with me in March 2015  When I the Sinemet 25-250 was increased to 5x a day at 6am, noon, 4pm, 8pm and midnight. They are unsure if he is getting the medication at these set times.  He is also taking Mirapex 0.5mg  5x a day. He is currently taking the amantadine 100mg  twice a day. He is also currently taking midodrine 5mg  twice a day.  He is  Living at [facility],   in a nursing home and is wheelchair bound.  His main concerns today are lack of exercise and rehab. He expresses a strong desire to ambulate more. His wife notes he has not been home or ambulating freely since March 0000000 due to complications from the DBS infection. He is unsure if they notice any benefit from the Sinemet.  They do notice more dyskinesias, especially since increasing the Sinemet to 5x a day. He does note frequent wearing off and freezing episodes. Dyskinesias have become bothersome. He notes some difficulty swallowing.   He notes an excessive amount of saliva production, causing some difficulty swallowing. Notes severe cervical neck pain. He notes some tilting of his head to the left and rotating to the right. Having painful spasms in bilateral trapezius. Causes difficulty with sleep due to the pain. Cannot tolerate muscle relaxants due to side effects.   Review of Systems: Out of a complete 14 system review, the patient complains of only the following symptoms, and all other  reviewed systems are negative.  + dyskinesias, fatigue, gait instability, neck pain and rigor, drooling. Tremor, and on off kinetic.   History   Social History  . Marital Status: Married    Spouse Name: [name]    Number of Children: 4  . Years of Education: PHD   Occupational History  . retired Education officer, museum professor    Social History Main Topics  . Smoking status: Never Smoker   . Smokeless tobacco: Never Used  . Alcohol Use: No  . Drug Use: No  . Sexual Activity: No   Other Topics Concern  . Not on file   Social History Narrative   Patient is married [name]) and he lives at [facility].   Patient has three living children and one son is deceased.   Patient is retired.   Patient has a PhD.   Patient is right-handed.   Patient drinks coffee occasionally.    Family History  Problem Relation Age of Onset  . Parkinsonism Sister   . Diabetes Brother   . Stroke Brother   . Crohn's disease Daughter   . Heart disease Paternal Grandfather   . Colon cancer Neg Hx     Past Medical History  Diagnosis Date  . HYPERLIPIDEMIA-MIXED 02/10/2008  . PARKINSON'S DISEASE 02/10/2008  . HYPERTENSION, BENIGN 02/09/2009  . GASTROINTESTINAL HEMORRHAGE, HX OF 02/10/2008  . ANEMIA 04/29/2010  . FIBRILLATION, ATRIAL 04/29/2010  . VENTRAL HERNIA 04/29/2010  .  DEGENERATIVE JOINT DISEASE 04/29/2010  . Gastric ulcer   . Positive PPD   . Internal hemorrhoids   . Hyperplastic colon polyp 2004  . CAD (coronary artery disease)     23.0 x 24 mm Taxus DES mid LAD 2005, [place] diagonal branch 2005  . VENOUS INSUFFICIENCY 02/10/2008    rt  . Shortness of breath   . Osteomyelitis   . Calculus of kidney   . Tuberculosis     80 yrs old  . GERD (gastroesophageal reflux disease)     ulcers hx  . Infection and inflammatory reaction due to device, implant, and graft   . Neuromuscular disorder   . Sundowning     past lumbar decompressive surgery  . Chronic kidney disease (CKD), stage III (moderate)   .  Blood transfusion without reported diagnosis   . Dyslipidemia   . Parkinson disease   . GI bleed   . Coronary artery disease   . PPD positive     internal hemorrhoids  . Depression   . Osteoporosis     Past Surgical History  Procedure Laterality Date  . Replacement total knee  2002    right  . Nose surgery    . Ankle surgery      left  . Toe amputation      right foot  . Cataract extraction    . Carpal tunnel release      left, open rt  . Tonsillectomy    . Appendectomy    . Coronary angioplasty with stent placement    . Back surgery  2006     back x3  . Deep brain stimulator placement  02    batteries changed 07  . Subthalamic stimulator battery replacement  02/27/2011    Procedure: SUBTHALAMIC STIMULATOR BATTERY REPLACEMENT;  Surgeon: Peggyann Shoals, MD;  Location: [place] [facility];  Service: Neurosurgery;  Laterality: Left;  DBS battery replacment x 2 - Left Chest and Abdomen  . Eye muscle surgery      bilat  . Subthalamic stimulator removal  05/22/2011    Procedure: SUBTHALAMIC STIMULATOR REMOVAL;  Surgeon: Erline Levine, MD;  Location: [place] [facility];  Service: Neurosurgery;  Laterality: N/A;  removal of IPG  . Subthalamic stimulator removal  07/24/2011    Procedure: SUBTHALAMIC STIMULATOR REMOVAL;  Surgeon: Erline Levine, MD;  Location: [place] [facility];  Service: Neurosurgery;  Laterality: N/A;  Deep Brain stimulator hardware removal  . Foot amputation through metatarsal      right  . Spine surgery      Current Outpatient Prescriptions  Medication Sig Dispense Refill  . acetaminophen (TYLENOL) 325 MG tablet Take 325 mg by mouth every 6 (six) hours as needed for mild pain or moderate pain. TAKE 1 TABLET IN THE EVENING DAILY      . albuterol (PROVENTIL) (2.5 MG/3ML) 0.083% nebulizer solution Take 3 mLs by nebulization every 6 (six) hours as needed for wheezing or shortness of breath.      Marland Kitchen amantadine (SYMMETREL) 100 MG capsule Take 1 capsule (100 mg total) by mouth 2 (two)  times daily. Take in AM and at lunch time.  60 capsule  5  . carbidopa-levodopa (SINEMET) 25-250 MG per tablet Take 1 tablet by mouth 5 (five) times daily. The first dose in a.m. with orange juice,  continue giving medication every 4-5 hours after initial morning doors. Suggested are 8 AM, 12 noon, 4 PM, 8 PM and a late nighttime dose.  150 tablet  5  .  dextromethorphan-guaiFENesin (DIABETIC TUSSIN DM) 10-100 MG/5ML liquid Take 10 mLs by mouth every 4 (four) hours as needed for cough.      . diclofenac sodium (VOLTAREN) 1 % GEL Apply 2 g topically 2 (two) times daily as needed.      Marland Kitchen HYDROcodone-acetaminophen (NORCO/VICODIN) 5-325 MG per tablet Take one tablet by mouth three times daily; Take one tablet by mouth every 6 hours as needed for mild to moderate pain; Take two tablets by mouth every 6 hours as needed for severe pain  360 tablet  0  . Ipratropium-Albuterol (DUONEB IN) Inhale into the lungs as needed.      Marland Kitchen LORazepam (ATIVAN) 0.5 MG tablet Take 1/2 tablet by mouth twice daily for anxiety  30 tablet  5  . midodrine (PROAMATINE) 5 MG tablet Take 5 mg by mouth 2 (two) times daily.       . pantoprazole (PROTONIX) 40 MG tablet Take 40 mg by mouth daily. For gastric ulcer      . PARoxetine (PAXIL) 20 MG tablet Take 20 mg by mouth every morning. For depression      . polyethylene glycol (MIRALAX / GLYCOLAX) packet Take 17 g by mouth daily. In 8 ounces daily      . pramipexole (MIRAPEX) 0.5 MG tablet Take 0.5 mg by mouth 5 (five) times daily.      Marland Kitchen pyridOXINE (VITAMIN B-6) 50 MG tablet Take 50 mg by mouth daily.      Orlie Dakin Sodium (SENOKOT S PO) Take by mouth at bedtime.      Marland Kitchen zolpidem (AMBIEN) 5 MG tablet Take one tablet by mouth every night at bedtime for rest  30 tablet  5  . aspirin EC 81 MG tablet Take 81 mg by mouth daily.      . [DISCONTINUED] clonazePAM (KLONOPIN) 0.5 MG tablet Take 0.5 mg by mouth at bedtime as needed.       No current facility-administered medications  for this visit.    Allergies as of 07/11/2013 - Review Complete 07/11/2013  Allergen Reaction Noted  . Morphine Other (See Comments) 04/29/2010  . Nitroglycerin Other (See Comments) 09/12/2011  . Risperidone and related Other (See Comments) 02/17/2011  . Tuberculin tests Other (See Comments) 06/27/2011  . Vancomycin Rash 05/27/2011    Vitals: BP 104/62 Last Weight:  Wt Readings from Last 1 Encounters:  06/27/13 212 lb 12.8 oz (96.525 kg)   Last Height:   Ht Readings from Last 1 Encounters:  03/24/13 6\' 1"  (1.854 m)     Physical exam: Exam: Gen: NAD, conversant Eyes: anicteric sclerae, moist conjunctivae HENT: Atraumatic, oropharynx clear Neck: Trachea midline; supple,  Lungs: CTA, no wheezing, rales, rhonic                          CV: RRR, no MRG Abdomen: Soft, non-tender; midline hernia under umbilicus-  Extremities: No peripheral edema  Skin: Normal temperature, no rash,  Psych: Appropriate affect, pleasant  Neuro: MS:  MMSE 23/30  Cranial nerves: There is good facial symmetry. There is facial hypomima. Pupils are equal round and reactive to light bilaterally. ptosis in the left.  The speech is fluent, hypophonic and mildly dysarthric.  Soft palate has limited rise - there is no tongue deviation or. tremor of the tongue.  Hearing is intact to conversational tone.  Sensation: Sensation is intact to light and pinprick throughout (facial, trunk, extremities). Vibration is markedly decreased distally.   There is no  sensory dermatomal level identified.   Motor: Strength is 5/5 in the bilateral upper and lower extremities.  Noted left laterocolis and right torticolis, tenderness to palpation in bilateral trapezius and left levator scapulae  Deep tendon reflexes: Deep tendon reflexes are 0/4 at the bilateral biceps, triceps, brachioradialis, patella and achilles. Plantar responses are downgoing bilaterally.   Movement examination:  There is a moderate right upper  extremity resting tremor. There is an intermittent left upper extremity resting tremor.  There is a chin tremor. Near continuous dyskinesias of extremities > trunk noted throughout the exam. Marked bradykinesia with finger taps, hand opening/closing  Gait and Station: The patient is unable to arising out of a chair even with maximal assistance.   Sister has PD.   Assessment:  After physical and neurologic examination, review of laboratory studies, imaging, neurophysiology testing and pre-existing records, assessment :1)PD 2)Cervical dystonia 3)Sialorrhea 4)Cognitive decline MMSE was 20 out of 30 ,  Last time 23.    1) PD late stage , 20 year history, patient may benefit from the  Dyodopa pump, discussed benefits and risk at length- Mainly regular sustained DOPA effects.   Plan:  Treatment plan and :    Continue  Sinemet 25-250 immediate release  to 5x a day at 6am, 10am, 2pm, 6pm, 10pm. He has marked hallucinations any time of the day now.  He appears to be an ideal candidate for Duopa/LCIG therapy due to his marked motor fluctuations. The family was already contactedby patient advocate RN  for further information.  I agree to place request for Botox injections for sialorrhea and cervical dystonia.  I will not consider patient for Amantadine ER trial at the same time.  will recheck MMSE at next visit.   Follow up with dr Janann Colonel once approval granted for Botox.   Larey Seat, MD  [facility] [address] [place], [place] [address]  Phone [phone] Fax [phone]

## 2013-07-21 ENCOUNTER — Telehealth: Payer: Self-pay | Admitting: Neurology

## 2013-07-21 NOTE — Telephone Encounter (Signed)
[name] returned [name]'s calling for information that is needed to process the benefits for the medication Duopa.  1. They need a copy of the most recent visit. 2. Prescription is missing information  1. Dosing (which will need calculations)  2. Instructions ( Use 1 cassette daily)      Supplies-Male to Male Luerlock  (use one daily as directed)      Luerlock cap (use as needed)                 Syringe (use 1 daily)                 Battery (change once a week) You can reach [name] at [phone] Ext [phone], also she is requesting Dr. Hazle Quant email for viewing of the [place]. Please call [name] if you have any ?'s and if any of this doesn't sound correct. I did go back over this with her to make sure. Thanks

## 2013-07-25 NOTE — Telephone Encounter (Signed)
Spoke to [place] at [place] and she spoke to the patient and his wife.  They have not decided to proceed yet, but the enrollment form has been submitted so they can start a preliminary benefits investigation.  The reimbursement specialist from the company will get in touch with the patient and the office, once benefits, out of pocket expense, etc is determined.  If the patient decides to proceed we will be notified by the pharmacy.

## 2013-07-28 ENCOUNTER — Non-Acute Institutional Stay (SKILLED_NURSING_FACILITY): Payer: Medicare Other | Admitting: Internal Medicine

## 2013-07-28 DIAGNOSIS — I951 Orthostatic hypotension: Secondary | ICD-10-CM

## 2013-07-28 DIAGNOSIS — G20 Parkinson's disease: Secondary | ICD-10-CM

## 2013-07-28 DIAGNOSIS — D649 Anemia, unspecified: Secondary | ICD-10-CM

## 2013-07-28 DIAGNOSIS — K219 Gastro-esophageal reflux disease without esophagitis: Secondary | ICD-10-CM

## 2013-07-29 NOTE — Progress Notes (Signed)
              PROGRESS NOTE  DATE: 07/28/13  FACILITY: [facility]  VISIT TYPE: Routine  CHIEF COMPLAINT:  Manage PD, GERD & orthostatic hypotension  HISTORY OF PRESENT ILLNESS:  REASSESSMENT OF ONGOING PROBLEMS:  PARKINSON'S DISEASE: pt's PD is stable.  Staff Deny progression of sx recently.  Pt is tolerating PD meds without any complications.   Pt is now hospice. Patient is a poor historian  ORTHOSTATIC HYPOTENSION: The orthostatcic hypotension remains stable.  Pt is tolerating current meds without any adverse reactions.  No dizziness or lightheadedness reported.  GERD: pt's GERD is stable.  Staff Deny ongoing heartburn, abd. Pain, nausea or vomiting.  Currently on a PPI & tolerates it without any adverse reactions.  PAST MEDICAL HISTORY : Reviewed.  No changes.  CURRENT MEDICATIONS: Reviewed per [facility]  REVIEW OF SYSTEMS: ROS is unobtainable due to pt being a poor historian  PHYSICAL EXAMINATION  VS: See vital signs section        GENERAL: no acute distress, normal body habitus EYES: Normal sclerae, normal conjunctivae, no discharge NECK: supple, trachea midline, no neck masses, no thyroid tenderness, no thyromegaly LYMPHATICS: No cervical lymphadenopathy, no supraclavicular lymphadenopathy RESPIRATORY: breathing is even & unlabored, BS CTAB CARDIAC: RRR, no murmur,no extra heart sounds, +1 bilateral lower extremity edema GI: abdomen soft, normal BS, no masses, no tenderness, no hepatomegaly, no splenomegaly PSYCHIATRIC: the patient is alert & disoriented, affect & behavior appropriate  LABS/RADIOLOGY:  4-15 liver profile normal, CMP normal, hemoglobin 12.3, MCV 96.6 otherwise CBC normal 6/14 hemoglobin 12.8, MCV 91.3 otherwise CBC normal, glucose 102, BUN 24, creatinine 1.3 otherwise CMP normal  3/14 Hb 12.6, mcv 87.9 ow cbc nl 12/13 bun 25, cr 1.42 ow cmp nl  ASSESSMENT/PLAN:  PD-stable.  Advanced.  On hospice. Orthostatic hypotension- stable. anemia-  stable. GERD-well controlled. Constipation-no complaints OA- denies pain Anxiety-stable We did not do further labs due to hospice status  CPT CODE: 46659  Wileen Duncanson Y. Durwin Reges, [place] [phone]

## 2013-08-18 ENCOUNTER — Other Ambulatory Visit: Payer: Self-pay | Admitting: *Deleted

## 2013-08-18 MED ORDER — LORAZEPAM 0.5 MG PO TABS: ORAL_TABLET | ORAL | Status: DC

## 2013-08-18 NOTE — Progress Notes (Signed)
Script faxed to [facility] for [place].

## 2013-08-19 ENCOUNTER — Non-Acute Institutional Stay (SKILLED_NURSING_FACILITY): Payer: Medicare Other | Admitting: Adult Health

## 2013-08-19 ENCOUNTER — Encounter: Payer: Self-pay | Admitting: Adult Health

## 2013-08-19 DIAGNOSIS — M159 Polyosteoarthritis, unspecified: Secondary | ICD-10-CM

## 2013-08-19 DIAGNOSIS — F329 Major depressive disorder, single episode, unspecified: Secondary | ICD-10-CM

## 2013-08-19 DIAGNOSIS — M150 Primary generalized (osteo)arthritis: Secondary | ICD-10-CM

## 2013-08-19 DIAGNOSIS — G4700 Insomnia, unspecified: Secondary | ICD-10-CM

## 2013-08-19 DIAGNOSIS — F411 Generalized anxiety disorder: Secondary | ICD-10-CM

## 2013-08-19 DIAGNOSIS — F32A Depression, unspecified: Secondary | ICD-10-CM

## 2013-08-19 DIAGNOSIS — I951 Orthostatic hypotension: Secondary | ICD-10-CM

## 2013-08-19 DIAGNOSIS — G20 Parkinson's disease: Secondary | ICD-10-CM

## 2013-08-19 DIAGNOSIS — F419 Anxiety disorder, unspecified: Secondary | ICD-10-CM

## 2013-08-19 DIAGNOSIS — K5900 Constipation, unspecified: Secondary | ICD-10-CM

## 2013-08-19 DIAGNOSIS — F3289 Other specified depressive episodes: Secondary | ICD-10-CM

## 2013-08-19 DIAGNOSIS — K219 Gastro-esophageal reflux disease without esophagitis: Secondary | ICD-10-CM

## 2013-08-23 ENCOUNTER — Telehealth: Payer: Self-pay | Admitting: Neurology

## 2013-08-23 NOTE — Telephone Encounter (Signed)
Roanna Epley, nurse case manager for patient, states that patient's wife called her back to state that they are not moving forward with Duopa because of hospice. Please return call to [facility] and advise.

## 2013-08-24 NOTE — Telephone Encounter (Signed)
Noted, thank you

## 2013-08-24 NOTE — Telephone Encounter (Signed)
No, if the patient notified Roanna Epley than that is fine. We do not need to discuss further with her.

## 2013-08-24 NOTE — Telephone Encounter (Signed)
Dr. Janann Colonel it looks like patient is not going forward with Duopa.  Do you want me to speak to [place] at [facility] about it, even though she has already been notified by patient.

## 2013-09-21 NOTE — Progress Notes (Signed)
Patient ID: [name], male   DOB: 10-15-1929, 80 y.o.   MRN: [id number]              PROGRESS NOTE  DATE: 08/19/13  FACILITY:  [facility]  VISIT TYPE:  Routine Visit  CHIEF COMPLAINT:  Manage PD, GERD & orthostatic hypotension  HISTORY OF PRESENT ILLNESS:  REASSESSMENT OF ONGOING PROBLEMS:  PARKINSON'S DISEASE: pt's PD is stable.  Staff Deny progression of sx recently.  Pt is tolerating PD meds without any complications.   Pt is now hospice. Patient is a poor historian  DEPRESSION: The depression remains stable. Patient denies ongoing feelings of sadness, insomnia, anedhonia or lack of appetite. No complications reported from the medications currently being used. Staff do not report behavioral problems.  INSOMNIA: The insomnia remains stable.  No complications noted from the medications presently being used. Patient denies ongoing insomnia, pain, hallucinations, delusions.   PAST MEDICAL HISTORY : Reviewed.  No changes.  CURRENT MEDICATIONS: Reviewed per [facility]  REVIEW OF SYSTEMS: ROS is unobtainable due to pt being a poor historian  PHYSICAL EXAMINATION  GENERAL: no acute distress, normal body habitus EYES: Normal sclerae, normal conjunctivae, no discharge NECK: supple, trachea midline, no neck masses, no thyroid tenderness, no thyromegaly RESPIRATORY: breathing is even & unlabored, BS CTAB CARDIAC: RRR, no murmur,no extra heart sounds, +1 bilateral lower extremity edema GI: abdomen soft, normal BS, no masses, no tenderness, no hepatomegaly, no splenomegaly EXTREMITIES: able to move all 4 extremities; uses wheelchair NEURO: + tremors PSYCHIATRIC: the patient is alert & disoriented, affect & behavior appropriate  LABS/RADIOLOGY: 4-15 liver profile normal, CMP normal, hemoglobin 12.3, MCV 96.6 otherwise CBC normal 6/14 hemoglobin 12.8, MCV 91.3 otherwise CBC normal, glucose 102, BUN 24, creatinine 1.3 otherwise CMP normal 3/14 Hb 12.6, mcv 87.9 ow cbc nl 12/13 bun 25, cr  1.42 ow cmp nl  ASSESSMENT/PLAN:  PD-stable.  Advanced.  On hospice.; Continue Sinemet, amantadine and Mirapex Orthostatic hypotension- stable. GERD-well controlled; continue Protonix Constipation-no complaints; continue Senokot S. and MiraLax OA- denies pain; continue Voltaren gel 1% Anxiety-stable; continue Ativan Depression - stable; continue Paxil   CPT CODE: 81017  Seth Bake - NP [facility] [phone]

## 2013-09-27 ENCOUNTER — Encounter (INDEPENDENT_AMBULATORY_CARE_PROVIDER_SITE_OTHER): Payer: Self-pay

## 2013-09-27 ENCOUNTER — Telehealth: Payer: Self-pay | Admitting: Neurology

## 2013-09-27 ENCOUNTER — Ambulatory Visit (INDEPENDENT_AMBULATORY_CARE_PROVIDER_SITE_OTHER): Admitting: Neurology

## 2013-09-27 ENCOUNTER — Encounter: Payer: Self-pay | Admitting: Neurology

## 2013-09-27 VITALS — BP 122/75 | HR 61

## 2013-09-27 DIAGNOSIS — G20 Parkinson's disease: Secondary | ICD-10-CM

## 2013-09-27 DIAGNOSIS — I2510 Atherosclerotic heart disease of native coronary artery without angina pectoris: Secondary | ICD-10-CM

## 2013-09-27 DIAGNOSIS — G20A1 Parkinson's disease without dyskinesia, without mention of fluctuations: Secondary | ICD-10-CM

## 2013-09-27 NOTE — Progress Notes (Signed)
[facility]    Provider:  Dr Janann Colonel Referring Provider: Eulas Post, MD Primary Care Physician:  Eulas Post, MD  CC:  PD  HPI:  [name] is a 80 y.o. male here as a referral from Dr. Elease Hashimoto for 2nd opinion of his PD. Last visit was 07/2013. At that time we discussed different options including Duopa. He wishes to hold off on Duopa at this time. Since last visit he has seen Dr Brett Fairy who placed a request for Botox for sialorrhea and cervical dystonia.(unclear if paperwork completed). His main concern at the visit today are hallucinations. Sees animal and people that are not there, this occurs randomly throughout the day and is not worse at night. He is currently taking SInemet 25-250 5x a day, Mirapex 0.5mg  5x a day and amantadine 100mg  BID. He remains in a wheelchair.   His wife notes concern over double vision.   Prior visit 07/2013 Originally diagnosed with PD in 1987, had DBS placed in 2002, unfortunately caught a mycotic infection with battery change in 2012 and device had to be removed. Main concerns recently have been increased drooling and orthostatic hypotension. At last visit with Dr Brett Fairy the Sinemet 25-250 was increased to 5x a day at 6am, noon, 4pm, 8pm and midnight. They are unsure if he is getting the medication at these set times. He is also taking Mirapex 0.5mg  5x a day. He is currently taking the amantadine 100mg  twice a day. He is also currently taking midodrine 5mg  twice a day.   He is currently living in a nursing home and is wheelchair bound. His main concerns today are lack of exercise and rehab. He expresses a strong desire to ambulate more. His wife notes he has not been home or ambulating freely since March 9449 due to complications from the DBS infection. He is unsure if they notice any benefit from the Sinemet. They do notice more dyskinesias, especially since increasing the Sinemet to 5x a day. He does note frequent wearing  off and freezing episodes. Dyskinesias have become bothersome. He notes some difficulty swallowing.   He notes an excessive amount of saliva production, causing some difficulty swallowing. Notes severe cervical neck pain. He notes some tilting of his head to the left and rotating to the right. Having painful spasms in bilateral trapezius. Causes difficulty with sleep due to the pain. Cannot tolerate muscle relaxants due to side effects.   Review of Systems: Out of a complete 14 system review, the patient complains of only the following symptoms, and all other reviewed systems are negative. + dyskinesias, fatigue, gait instability, neck pain  History   Social History  . Marital Status: Married    Spouse Name: [name]    Number of Children: 4  . Years of Education: PHD   Occupational History  . retired Education officer, museum professor    Social History Main Topics  . Smoking status: Never Smoker   . Smokeless tobacco: Never Used  . Alcohol Use: No  . Drug Use: No  . Sexual Activity: No   Other Topics Concern  . Not on file   Social History Narrative   Patient is married [name]) and he lives at [facility].   Patient has three living children and one son is deceased.   Patient is retired.   Patient has a PhD.   Patient is right-handed.   Patient drinks coffee very occasionally.    Family History  Problem Relation Age of Onset  . Parkinsonism  Sister   . Diabetes Brother   . Stroke Brother   . Crohn's disease Daughter   . Heart disease Paternal Grandfather   . Colon cancer Neg Hx     Past Medical History  Diagnosis Date  . HYPERLIPIDEMIA-MIXED 02/10/2008  . PARKINSON'S DISEASE 02/10/2008  . HYPERTENSION, BENIGN 02/09/2009  . GASTROINTESTINAL HEMORRHAGE, HX OF 02/10/2008  . ANEMIA 04/29/2010  . FIBRILLATION, ATRIAL 04/29/2010  . VENTRAL HERNIA 04/29/2010  . DEGENERATIVE JOINT DISEASE 04/29/2010  . Gastric ulcer   . Positive PPD   . Internal hemorrhoids   . Hyperplastic colon polyp  2004  . CAD (coronary artery disease)     23.0 x 24 mm Taxus DES mid LAD 2005, Nichols diagonal branch 2005  . VENOUS INSUFFICIENCY 02/10/2008    rt  . Shortness of breath   . Osteomyelitis   . Calculus of kidney   . Tuberculosis     80 yrs old  . GERD (gastroesophageal reflux disease)     ulcers hx  . Infection and inflammatory reaction due to device, implant, and graft   . Neuromuscular disorder   . Sundowning     past lumbar decompressive surgery  . Chronic kidney disease (CKD), stage III (moderate)   . Blood transfusion without reported diagnosis   . Dyslipidemia   . Parkinson disease   . GI bleed   . Coronary artery disease   . PPD positive     internal hemorrhoids  . Depression   . Osteoporosis     Past Surgical History  Procedure Laterality Date  . Replacement total knee  2002    right  . Nose surgery    . Ankle surgery      left  . Toe amputation      right foot  . Cataract extraction    . Carpal tunnel release      left, open rt  . Tonsillectomy    . Appendectomy    . Coronary angioplasty with stent placement    . Back surgery  2006     back x3  . Deep brain stimulator placement  02    batteries changed 07  . Subthalamic stimulator battery replacement  02/27/2011    Procedure: SUBTHALAMIC STIMULATOR BATTERY REPLACEMENT;  Surgeon: Peggyann Shoals, MD;  Location: [facility];  Service: Neurosurgery;  Laterality: Left;  DBS battery replacment x 2 - Left Chest and Abdomen  . Eye muscle surgery      bilat  . Subthalamic stimulator removal  05/22/2011    Procedure: SUBTHALAMIC STIMULATOR REMOVAL;  Surgeon: Erline Levine, MD;  Location: [facility];  Service: Neurosurgery;  Laterality: N/A;  removal of IPG  . Subthalamic stimulator removal  07/24/2011    Procedure: SUBTHALAMIC STIMULATOR REMOVAL;  Surgeon: Erline Levine, MD;  Location: [place] [facility];  Service: Neurosurgery;  Laterality: N/A;  Deep Brain stimulator hardware removal  . Foot amputation through metatarsal       right  . Spine surgery      Current Outpatient Prescriptions  Medication Sig Dispense Refill  . acetaminophen (TYLENOL) 325 MG tablet Take 325 mg by mouth every 6 (six) hours as needed for mild pain or moderate pain. TAKE 1 TABLET IN THE EVENING DAILY      . albuterol (PROVENTIL) (2.5 MG/3ML) 0.083% nebulizer solution Take 3 mLs by nebulization every 6 (six) hours as needed for wheezing or shortness of breath.      Marland Kitchen amantadine (SYMMETREL) 100 MG capsule Take 1  capsule (100 mg total) by mouth 2 (two) times daily. Take in AM and at lunch time.  60 capsule  5  . carbidopa-levodopa (SINEMET) 25-250 MG per tablet Take 1 tablet by mouth 5 (five) times daily. The first dose in a.m. with orange juice,  continue giving medication every 4-5 hours after initial morning doors. Suggested are 8 AM, 12 noon, 4 PM, 8 PM and a late nighttime dose.  150 tablet  5  . dextromethorphan-guaiFENesin (DIABETIC TUSSIN DM) 10-100 MG/5ML liquid Take 10 mLs by mouth every 4 (four) hours as needed for cough.      Marland Kitchen HYDROcodone-acetaminophen (NORCO/VICODIN) 5-325 MG per tablet Take one tablet by mouth three times daily; Take one tablet by mouth every 6 hours as needed for mild to moderate pain; Take two tablets by mouth every 6 hours as needed for severe pain  360 tablet  0  . Ipratropium-Albuterol (DUONEB IN) Inhale into the lungs as needed.      Marland Kitchen LORazepam (ATIVAN) 0.5 MG tablet Take 1/2 tablet by mouth twice daily for anxiety  30 tablet  0  . midodrine (PROAMATINE) 5 MG tablet Take 5 mg by mouth 2 (two) times daily.       . pantoprazole (PROTONIX) 40 MG tablet Take 40 mg by mouth daily. For gastric ulcer      . PARoxetine (PAXIL) 20 MG tablet Take 20 mg by mouth every morning. For depression      . polyethylene glycol (MIRALAX / GLYCOLAX) packet Take 17 g by mouth daily. In 8 ounces daily      . pramipexole (MIRAPEX) 0.5 MG tablet Take 0.5 mg by mouth 5 (five) times daily.      Marland Kitchen pyridOXINE (VITAMIN B-6) 50 MG tablet  Take 50 mg by mouth daily.      Orlie Dakin Sodium (SENOKOT S PO) Take by mouth at bedtime.      Marland Kitchen zolpidem (AMBIEN) 5 MG tablet Take one tablet by mouth every night at bedtime for rest  30 tablet  5  . [DISCONTINUED] clonazePAM (KLONOPIN) 0.5 MG tablet Take 0.5 mg by mouth at bedtime as needed.       No current facility-administered medications for this visit.    Allergies as of 09/27/2013 - Review Complete 09/27/2013  Allergen Reaction Noted  . Morphine Other (See Comments) 04/29/2010  . Nitroglycerin Other (See Comments) 09/12/2011  . Risperidone and related Other (See Comments) 02/17/2011  . Tuberculin tests Other (See Comments) 06/27/2011  . Vancomycin Rash 05/27/2011    Vitals: BP 122/75  Pulse 61 Last Weight:  Wt Readings from Last 1 Encounters:  08/19/13 208 lb (94.348 kg)   Last Height:   Ht Readings from Last 1 Encounters:  08/19/13 5\' 9"  (1.753 m)     Physical exam: Exam: Gen: NAD, conversant Eyes: anicteric sclerae, moist conjunctivae HENT: Atraumatic, oropharynx clear Neck: Trachea midline; supple,  Lungs: CTA, no wheezing, rales, rhonic                          CV: RRR, no MRG Abdomen: Soft, non-tender;  Extremities: No peripheral edema  Skin: Normal temperature, no rash,  Psych: Appropriate affect, pleasant  Neuro: MS:  MMSE 23/30 at prior visit  Cranial nerves: There is good facial symmetry. There is facial hypomima. Pupils are equal round and reactive to light bilaterally. ptosis in the left.  The speech is fluent, hypophonic and mildly dysarthric. Soft palate has limited Rise -  there is no tongue deviation. tremor of the tongue.  Hearing is intact to conversational tone.  Sensation: Sensation is intact to light and pinprick throughout (facial, trunk, extremities). Vibration is markedly decreased distally.   There is no sensory dermatomal level identified.   Motor: Strength is 5/5 in the bilateral upper and lower extremities.  Noted  left laterocolis and right torticolis, tenderness to palpation in bilateral trapezius and left levator scapulae  Deep tendon reflexes: Deep tendon reflexes are 0/4 at the bilateral biceps, triceps, brachioradialis, patella and achilles. Plantar responses are downgoing bilaterally.   Movement examination:  There is a moderate right upper extremity resting tremor. There is an intermittent left upper extremity resting tremor. There is a chin tremor. Near continuous dyskinesias of extremities > trunk noted throughout the exam. Marked bradykinesia with finger taps, hand opening/closing  Gait and Station: The patient is unable to arising out of a chair even with maximal assistance.     Assessment:  After physical and neurologic examination, review of laboratory studies, imaging, neurophysiology testing and pre-existing records, assessment will be reviewed on the problem list.  Plan:  Treatment plan and additional workup will be reviewed under Problem List.   1)PD 2)Cervical dystonia 3)Sialorrhea 4)Cognitive decline  80y/o gentleman with PD presenting for PD evaluation. Main concerns today are worsening hallucinations, and sialorrhea. He had a DBS in the past but it was removed in 2012 due to infection during a battery change. Will continue Sinemet 25-250 to 5x a day at 6am, 10am, 2pm, 6pm, 10pm. Main concern today is hallucinations therefore we will taper him off of amantadine. If hallucinations continue would consider tapering down the Mirapex too. Will hold off on botox injections for sialorrhea and CD as symptoms are stable at this time. Follow up with Dr Brett Fairy in 4 months.   Jim Like, DO  [facility] [address] [place], [place] [address]  Phone [phone] Fax [phone]

## 2013-09-27 NOTE — Telephone Encounter (Signed)
Called pt and spoke with pt's wife [name] to set up an appt, per Dr. Brett Fairy, on 11/30/13. I advised the wife that if the pt has any other problems, questions or concerns to call the office. Wife verbalized understanding.

## 2013-09-27 NOTE — Telephone Encounter (Signed)
Patient at check out today is wondering when to schedule next office visit with Dr. Brett Fairy. Please return call and advise.

## 2013-10-07 ENCOUNTER — Non-Acute Institutional Stay (SKILLED_NURSING_FACILITY): Payer: Medicare Other | Admitting: Adult Health

## 2013-10-07 ENCOUNTER — Encounter: Payer: Self-pay | Admitting: Adult Health

## 2013-10-07 DIAGNOSIS — K219 Gastro-esophageal reflux disease without esophagitis: Secondary | ICD-10-CM

## 2013-10-07 DIAGNOSIS — M150 Primary generalized (osteo)arthritis: Secondary | ICD-10-CM

## 2013-10-07 DIAGNOSIS — G20 Parkinson's disease: Secondary | ICD-10-CM

## 2013-10-07 DIAGNOSIS — F329 Major depressive disorder, single episode, unspecified: Secondary | ICD-10-CM

## 2013-10-07 DIAGNOSIS — F411 Generalized anxiety disorder: Secondary | ICD-10-CM

## 2013-10-07 DIAGNOSIS — F3289 Other specified depressive episodes: Secondary | ICD-10-CM

## 2013-10-07 DIAGNOSIS — G20A1 Parkinson's disease without dyskinesia, without mention of fluctuations: Secondary | ICD-10-CM

## 2013-10-07 DIAGNOSIS — I951 Orthostatic hypotension: Secondary | ICD-10-CM

## 2013-10-07 DIAGNOSIS — M159 Polyosteoarthritis, unspecified: Secondary | ICD-10-CM

## 2013-10-07 DIAGNOSIS — K5900 Constipation, unspecified: Secondary | ICD-10-CM

## 2013-10-07 DIAGNOSIS — F419 Anxiety disorder, unspecified: Secondary | ICD-10-CM

## 2013-10-07 DIAGNOSIS — F32A Depression, unspecified: Secondary | ICD-10-CM

## 2013-10-07 NOTE — Progress Notes (Signed)
Patient ID: ARDA KEADLE, male   DOB: 1929-09-22, 80 y.o.   MRN: [id number]              PROGRESS NOTE  DATE: 10/07/13  FACILITY:  [facility]  VISIT TYPE:  Routine Visit  CHIEF COMPLAINT:  Manage PD, GERD & orthostatic hypotension  HISTORY OF PRESENT ILLNESS:  REASSESSMENT OF ONGOING PROBLEMS:  ORTHOSTATIC HYPOTENSION: The orthostatcic hypotension remains stable.  Pt is tolerating current medications without any adverse reactions.  No dizziness or lightheadedness reported.  ANXIETY: The anxiety remains stable. Patient denies ongoing anxiety or irritability. No complications reported from the medications currently being used.  GERD: pt's GERD is stable.  Denies ongoing heartburn, abd. Pain, nausea or vomiting.  Currently on a PPI & tolerates it without any adverse reactions.   PAST MEDICAL HISTORY : Reviewed.  No changes.  CURRENT MEDICATIONS: Reviewed per [facility]  REVIEW OF SYSTEMS: ROS is unobtainable due to pt being a poor historian  PHYSICAL EXAMINATION  GENERAL: no acute distress, normal body habitus NECK: supple, trachea midline, no neck masses, no thyroid tenderness, no thyromegaly RESPIRATORY: breathing is even & unlabored, BS CTAB CARDIAC: RRR, no murmur,no extra heart sounds, +1 bilateral lower extremity edema GI: abdomen soft, normal BS, no masses, no tenderness, no hepatomegaly, no splenomegaly EXTREMITIES: able to move all 4 extremities; uses wheelchair NEURO: + tremors PSYCHIATRIC: the patient is alert & disoriented, affect & behavior appropriate  LABS/RADIOLOGY: 4-15 liver profile normal, CMP normal, hemoglobin 12.3, MCV 96.6 otherwise CBC normal 6/14 hemoglobin 12.8, MCV 91.3 otherwise CBC normal, glucose 102, BUN 24, creatinine 1.3 otherwise CMP normal 3/14 Hb 12.6, mcv 87.9 ow cbc nl 12/13 bun 25, cr 1.42 ow cmp nl  ASSESSMENT/PLAN:  PD-stable.  Advanced.  On hospice.; Continue Sinemet, amantadine and Mirapex Orthostatic hypotension- stable. GERD-well  controlled; continue Protonix Constipation-no complaints; continue Senokot S. and MiraLax OA- denies pain; continue Voltaren gel 1% Anxiety-stable; continue Ativan Depression - stable; continue Paxil   CPT CODE: 69678  Seth Bake - NP [facility] [phone]

## 2013-10-13 ENCOUNTER — Telehealth: Payer: Self-pay | Admitting: Neurology

## 2013-10-13 NOTE — Telephone Encounter (Signed)
Noted  

## 2013-10-13 NOTE — Telephone Encounter (Signed)
Patient's wife calling to ask if it is okay that patient go back to his usual dosage of Amantadine, since "life is very difficult" for the patient right now, please return call to wife and advise.

## 2013-10-13 NOTE — Telephone Encounter (Signed)
WID:  Spoke with Verne Spurr, RN hospice nurse.  Dr. Janann Colonel had reduced patient's Amantadine and eventually it was discontinued, thinking it was causing his hallucinations.  Patient has gotten much worse is hallucinating and tremors are worse.  She is asking if the patient can go back on Amantadine 100mg  twice a day.

## 2013-10-13 NOTE — Telephone Encounter (Signed)
Spoke with WID, Dr. Leta Baptist and he asked if tremors are the major problem or are the hallucinations more disturbing.  Spoke with hospice nurse and she said the patient is very stiff, back arched, legs spastic, he is moving every second and does not want to leave him like that.  Spoke to Dr. Leta Baptist and he gave verbal order to go back on Amantadine 100mg  twice a day.  If the patient's hallucinations continue to be a problem, he wants the patient to come in for an appointment to see the doctor. Called the hospice nurse to give order for Amantadine and relayed information about patient coming in for an appointment if further hallucinations.

## 2013-10-13 NOTE — Telephone Encounter (Signed)
Also called wife and relayed that information was given to hospice nurse about restarting Amantadine.

## 2013-10-18 ENCOUNTER — Telehealth: Payer: Self-pay | Admitting: Neurology

## 2013-10-18 NOTE — Telephone Encounter (Signed)
If he is doing better now I would not make any further changes at this time. He does not need to come in for a follow up unless Mrs Hashimi feels he needs further evaluation. Thanks.

## 2013-10-18 NOTE — Telephone Encounter (Signed)
Spoke to Mrs. Puerto and relayed doctors recommendation.  She agreed and will keep his follow up with Dr. Brett Fairy in September.

## 2013-10-18 NOTE — Telephone Encounter (Signed)
Spoke to patient's wife and she relayed that the patient is doing much better since restarting the Amantadine and she wanted Dr. Janann Colonel to be aware.  (See phone note 10-13-13.)  She also wanted to know if the doctor thinks the patient should start on Mirapex or should he come in for an appointment.

## 2013-10-18 NOTE — Telephone Encounter (Signed)
Patient's wife calling to thank Butch Penny the nurse very much for her return call, and she has one more thing that she would like to add and states to please give her a call back.

## 2013-10-21 NOTE — Telephone Encounter (Signed)
Got order from [facility] to restart medication of amantidine 100mg  2 times a day. He is doing so much better today. If You have any questions you can give Izora Gala a call.   This is from staff message by hospice RN Izora Gala, 10-17-13.  [phone]

## 2013-11-03 ENCOUNTER — Non-Acute Institutional Stay (SKILLED_NURSING_FACILITY): Payer: Medicare Other | Admitting: Adult Health

## 2013-11-03 ENCOUNTER — Encounter: Payer: Self-pay | Admitting: Adult Health

## 2013-11-03 DIAGNOSIS — M150 Primary generalized (osteo)arthritis: Secondary | ICD-10-CM

## 2013-11-03 DIAGNOSIS — K219 Gastro-esophageal reflux disease without esophagitis: Secondary | ICD-10-CM

## 2013-11-03 DIAGNOSIS — G20 Parkinson's disease: Secondary | ICD-10-CM

## 2013-11-03 DIAGNOSIS — M159 Polyosteoarthritis, unspecified: Secondary | ICD-10-CM

## 2013-11-03 DIAGNOSIS — F419 Anxiety disorder, unspecified: Secondary | ICD-10-CM

## 2013-11-03 DIAGNOSIS — I951 Orthostatic hypotension: Secondary | ICD-10-CM

## 2013-11-03 DIAGNOSIS — F3289 Other specified depressive episodes: Secondary | ICD-10-CM

## 2013-11-03 DIAGNOSIS — F411 Generalized anxiety disorder: Secondary | ICD-10-CM

## 2013-11-03 DIAGNOSIS — K5900 Constipation, unspecified: Secondary | ICD-10-CM

## 2013-11-03 DIAGNOSIS — G4700 Insomnia, unspecified: Secondary | ICD-10-CM

## 2013-11-03 DIAGNOSIS — F32A Depression, unspecified: Secondary | ICD-10-CM

## 2013-11-03 DIAGNOSIS — F329 Major depressive disorder, single episode, unspecified: Secondary | ICD-10-CM

## 2013-11-07 NOTE — Progress Notes (Signed)
Patient ID: [name], male   DOB: [birth year], 80 y.o.   MRN: [id number]              PROGRESS NOTE  DATE:    11/03/13  FACILITY:  [facility]  VISIT TYPE:  Routine Visit  CHIEF COMPLAINT:  Manage PD, GERD & orthostatic hypotension  HISTORY OF PRESENT ILLNESS:  REASSESSMENT OF ONGOING PROBLEMS:  PARKINSON'S DISEASE: pt's Parkinson's disease is stable.  Denies progression of sx recently.  Pt is tolerating Parkinson's disease medications without any complications.  DEPRESSION: The depression remains stable. Patient denies ongoing feelings of sadness, insomnia, anedhonia or lack of appetite. No complications reported from the medications currently being used. Staff do not report behavioral problems.  ALLERGIC RHINITIS: Allergic rhinitis remains stable.  Patient denies ongoing symptoms such as runny nose sneezing or tearing. No complications reported from the current medication(s) being used.  PAST MEDICAL HISTORY : Reviewed.  No changes.  CURRENT MEDICATIONS: Reviewed per [facility]  REVIEW OF SYSTEMS: ROS is unobtainable due to pt being a poor historian  PHYSICAL EXAMINATION  GENERAL: no acute distress, normal body habitus EYES:  Lids open and close normally. No blepharitis, entropion or ectropion. NECK: supple, trachea midline, no neck masses, no thyroid tenderness, no thyromegaly RESPIRATORY: breathing is even & unlabored, BS CTAB CARDIAC: RRR, no murmur,no extra heart sounds, +1 bilateral lower extremity edema GI: abdomen soft, normal BS, no masses, no tenderness, no hepatomegaly, no splenomegaly EXTREMITIES: able to move all 4 extremities; uses wheelchair NEURO: + tremors PSYCHIATRIC: the patient is alert & disoriented, affect & behavior appropriate  LABS/RADIOLOGY: 4-15 liver profile normal, CMP normal, hemoglobin 12.3, MCV 96.6 otherwise CBC normal 6/14 hemoglobin 12.8, MCV 91.3 otherwise CBC normal, glucose 102, BUN 24, creatinine 1.3 otherwise CMP normal 3/14 Hb 12.6, mcv  87.9 ow cbc nl 12/13 bun 25, cr 1.42 ow cmp nl  ASSESSMENT/PLAN:  PD-stable.  Advanced.  On hospice.; Continue Sinemet, amantadine and Mirapex Orthostatic hypotension- stable; continue Midodrine PRN GERD-well controlled; continue Protonix Constipation-no complaints; continue Enulose,Senokot S. and MiraLax OA- denies pain; continue Voltaren gel 1% Anxiety-stable; continue Ativan Depression - stable; continue Paxil Insomnia - continue Ambien   CPT CODE: 75449  Seth Bake - NP [facility] [phone]

## 2013-11-10 ENCOUNTER — Telehealth: Payer: Self-pay | Admitting: Neurology

## 2013-11-10 NOTE — Telephone Encounter (Signed)
Faxed supplemental orders to [facility] on 11/10/13.

## 2013-11-11 ENCOUNTER — Other Ambulatory Visit: Payer: Self-pay | Admitting: *Deleted

## 2013-11-11 MED ORDER — LORAZEPAM 0.5 MG PO TABS: ORAL_TABLET | ORAL | Status: DC

## 2013-11-11 NOTE — Telephone Encounter (Signed)
[facility] 

## 2013-11-23 ENCOUNTER — Telehealth: Payer: Self-pay | Admitting: Neurology

## 2013-11-23 NOTE — Telephone Encounter (Signed)
Faxed supplemental orders to Hospice and [place] on 11/23/13.

## 2013-11-25 ENCOUNTER — Non-Acute Institutional Stay (SKILLED_NURSING_FACILITY): Payer: Medicare Other | Admitting: Adult Health

## 2013-11-25 ENCOUNTER — Encounter: Payer: Self-pay | Admitting: Adult Health

## 2013-11-25 DIAGNOSIS — G4700 Insomnia, unspecified: Secondary | ICD-10-CM

## 2013-11-25 DIAGNOSIS — K219 Gastro-esophageal reflux disease without esophagitis: Secondary | ICD-10-CM

## 2013-11-25 DIAGNOSIS — F411 Generalized anxiety disorder: Secondary | ICD-10-CM

## 2013-11-25 DIAGNOSIS — F419 Anxiety disorder, unspecified: Secondary | ICD-10-CM

## 2013-11-25 DIAGNOSIS — M150 Primary generalized (osteo)arthritis: Secondary | ICD-10-CM

## 2013-11-25 DIAGNOSIS — I951 Orthostatic hypotension: Secondary | ICD-10-CM

## 2013-11-25 DIAGNOSIS — K5900 Constipation, unspecified: Secondary | ICD-10-CM

## 2013-11-25 DIAGNOSIS — M159 Polyosteoarthritis, unspecified: Secondary | ICD-10-CM

## 2013-11-25 DIAGNOSIS — G20 Parkinson's disease: Secondary | ICD-10-CM

## 2013-11-25 DIAGNOSIS — G20A1 Parkinson's disease without dyskinesia, without mention of fluctuations: Secondary | ICD-10-CM

## 2013-11-25 NOTE — Progress Notes (Signed)
Patient ID: [name], male   DOB: 06/06/1929, 80 y.o.   MRN: [id number]              PROGRESS NOTE  DATE:       11/25/13  FACILITY:  [facility]  VISIT TYPE:  Routine Visit  CHIEF COMPLAINT:  Manage PD, GERD & orthostatic hypotension  HISTORY OF PRESENT ILLNESS:  REASSESSMENT OF ONGOING PROBLEMS:  GERD: pt's GERD is stable.  Denies ongoing heartburn, abd. Pain, nausea or vomiting.  Currently on a PPI & tolerates it without any adverse reactions.  ORTHOSTATIC HYPOTENSION: The orthostatcic hypotension remains stable.  Pt is tolerating current medications without any adverse reactions.  No dizziness or lightheadedness reported.  ANXIETY: The anxiety remains stable. Patient denies ongoing anxiety or irritability. No complications reported from the medications currently being used.  PAST MEDICAL HISTORY : Reviewed.  No changes.  CURRENT MEDICATIONS: Reviewed per [facility]  REVIEW OF SYSTEMS: ROS is unobtainable due to pt being a poor historian  PHYSICAL EXAMINATION  GENERAL: no acute distress, normal body habitus NECK: supple, trachea midline, no neck masses, no thyroid tenderness, no thyromegaly RESPIRATORY: breathing is even & unlabored, BS CTAB CARDIAC: RRR, no murmur,no extra heart sounds, +1 bilateral lower extremity edema GI: abdomen soft, normal BS, no masses, no tenderness, no hepatomegaly, no splenomegaly EXTREMITIES: able to move all 4 extremities; uses wheelchair NEURO: + tremors PSYCHIATRIC: the patient is alert & disoriented, affect & behavior appropriate  LABS/RADIOLOGY: 4-15 liver profile normal, CMP normal, hemoglobin 12.3, MCV 96.6 otherwise CBC normal 6/14 hemoglobin 12.8, MCV 91.3 otherwise CBC normal, glucose 102, BUN 24, creatinine 1.3 otherwise CMP normal 3/14 Hb 12.6, mcv 87.9 ow cbc nl 12/13 bun 25, cr 1.42 ow cmp nl  ASSESSMENT/PLAN:  PD-stable.  Advanced.  On hospice.; Continue Sinemet, amantadine and Mirapex Orthostatic hypotension- stable; continue  Midodrine PRN GERD-well controlled; continue Omeprazole Constipation-no complaints; continue Enulose,Senokot S. and MiraLax OA- denies pain; continue Voltaren gel 1% Anxiety-stable; continue Ativan Depression - stable; continue Paxil Insomnia - continue Ambien   CPT CODE: 57262  Seth Bake - NP [facility] [phone]

## 2013-11-29 ENCOUNTER — Telehealth: Payer: Self-pay | Admitting: *Deleted

## 2013-11-29 ENCOUNTER — Other Ambulatory Visit: Payer: Self-pay | Admitting: *Deleted

## 2013-11-29 MED ORDER — ZOLPIDEM TARTRATE 5 MG PO TABS: ORAL_TABLET | ORAL | Status: DC

## 2013-11-29 NOTE — Telephone Encounter (Signed)
Dr. Brett Fairy wanted me to call the patient to see if he could come in at 4:00 pm instead of at 2:00 pm.  Patient's wife will call transportation and see if they can change the time and give the office a call back to confirm either today or first thing in the morning.

## 2013-11-29 NOTE — Telephone Encounter (Signed)
[facility] 

## 2013-11-30 ENCOUNTER — Encounter (INDEPENDENT_AMBULATORY_CARE_PROVIDER_SITE_OTHER): Payer: Self-pay

## 2013-11-30 ENCOUNTER — Encounter: Payer: Self-pay | Admitting: Neurology

## 2013-11-30 ENCOUNTER — Ambulatory Visit (INDEPENDENT_AMBULATORY_CARE_PROVIDER_SITE_OTHER): Admitting: Neurology

## 2013-11-30 VITALS — BP 119/77 | HR 90 | Resp 16 | Wt 209.0 lb

## 2013-11-30 DIAGNOSIS — G20A1 Parkinson's disease without dyskinesia, without mention of fluctuations: Secondary | ICD-10-CM

## 2013-11-30 DIAGNOSIS — G20 Parkinson's disease: Secondary | ICD-10-CM

## 2013-11-30 DIAGNOSIS — I2510 Atherosclerotic heart disease of native coronary artery without angina pectoris: Secondary | ICD-10-CM

## 2013-11-30 MED ORDER — AMANTADINE HCL 100 MG PO CAPS: ORAL_CAPSULE | ORAL | Status: AC

## 2013-11-30 MED ORDER — PRAMIPEXOLE DIHYDROCHLORIDE 0.5 MG PO TABS: 0.5000 mg | ORAL_TABLET | Freq: Three times a day (TID) | ORAL | Status: AC

## 2013-11-30 NOTE — Telephone Encounter (Signed)
Patient's wife called back and was unable to change the appointment time to 4 pm on today.

## 2013-11-30 NOTE — Progress Notes (Signed)
[facility]    Provider:  Dr Janann Colonel Referring Provider: Eulas Post, MD Primary Care Physician:  Eulas Post, MD  CC:  PD  HPI:  [name] is a 80 y.o. male here as a referral from   [facility] and has recently seen my colleage Dr Janann Colonel.  2nd opinion of his PD. Last visit was 07/2013. At that time we discussed different options including Duopa.  He wishes to hold off on Duopa at this time- he is living at [facility] and on Hospice support- nobody feels competent enough to refill and adjust the pump.  The concern about double vision is resolved, the patient had accidentially switches his glasses with that of his room mate at the nursing home.  I, Dr. Asencion Partridge  Jkayla Spiewak have placed a request for Botox for sialorrhea and cervical dystonia.  His main concern at the visit today- 11-30-13 ) are again the visiual hallucinations.  Sees animal and people that are not there, this occurs randomly throughout the day and is not worse at night. He is currently taking SInemet 25-250 5x a day,  He was taken off Mirapex and this did not change the visual perceptions, so he started back . Mirapex generic at 0.5mg ,  5x a day and amantadine 100mg  BID. He remains in a wheelchair. He has lost weight.     Originally diagnosed with PD in 1987, had DBS placed in 2002, unfortunately caught a mycotic infection with battery change in 2012 and device had to be removed. Main concerns recently have been increased drooling and orthostatic hypotension. At last visit with Dr Brett Fairy the Sinemet 25-250 was increased to 5x a day at 6am, noon, 4pm, 8pm and midnight. They are unsure if he is getting the medication at these set times. He is also taking Mirapex 0.5mg  5x a day. He is currently taking the amantadine 100mg  twice a day. He is also currently taking midodrine 5mg  twice a day.   He is currently living in a nursing home and is wheelchair bound. His main concerns today are lack of exercise  and rehab. He expresses a strong desire to ambulate more. His wife notes he has not been home or ambulating freely since March 5427 due to complications from the DBS infection. He is unsure if they notice any benefit from the Sinemet. They do notice more dyskinesias, especially since increasing the Sinemet to 5x a day. He does note frequent wearing off and freezing episodes.   Review of Systems: Out of a complete 14 system review, the patient complains of only the following symptoms, and all other reviewed systems are negative. + dyskinesias, fatigue, gait instability, neck pain Dyskinesias have become bothersome. He notes some difficulty swallowing.  He notes an excessive amount of saliva production, causing some difficulty swallowing. Notes severe cervical neck pain. He notes some tilting of his head to the left and rotating to the right. Having painful spasms in bilateral trapezius. Causes difficulty with sleep due to the pain. Cannot tolerate muscle relaxants due to side effects.     History   Social History  . Marital Status: Married    Spouse Name: [name]    Number of Children: 4  . Years of Education: PHD   Occupational History  . retired Education officer, museum professor    Social History Main Topics  . Smoking status: Never Smoker   . Smokeless tobacco: Never Used  . Alcohol Use: No  . Drug Use: No  . Sexual Activity: No  Other Topics Concern  . Not on file   Social History Narrative   Patient is married [name]) and he lives at [facility].   Patient has three living children and one son is deceased.   Patient is retired.   Patient has a PhD.   Patient is right-handed.   Patient drinks coffee very occasionally.    Family History  Problem Relation Age of Onset  . Parkinsonism Sister   . Diabetes Brother   . Stroke Brother   . Crohn's disease Daughter   . Heart disease Paternal Grandfather   . Colon cancer Neg Hx     Past Medical History  Diagnosis Date  .  HYPERLIPIDEMIA-MIXED 02/10/2008  . PARKINSON'S DISEASE 02/10/2008  . HYPERTENSION, BENIGN 02/09/2009  . GASTROINTESTINAL HEMORRHAGE, HX OF 02/10/2008  . ANEMIA 04/29/2010  . FIBRILLATION, ATRIAL 04/29/2010  . VENTRAL HERNIA 04/29/2010  . DEGENERATIVE JOINT DISEASE 04/29/2010  . Gastric ulcer   . Positive PPD   . Internal hemorrhoids   . Hyperplastic colon polyp 2004  . CAD (coronary artery disease)     23.0 x 24 mm Taxus DES mid LAD 2005, [place] diagonal branch 2005  . VENOUS INSUFFICIENCY 02/10/2008    rt  . Shortness of breath   . Osteomyelitis   . Calculus of kidney   . Tuberculosis     80 yrs old  . GERD (gastroesophageal reflux disease)     ulcers hx  . Infection and inflammatory reaction due to device, implant, and graft   . Neuromuscular disorder   . Sundowning     past lumbar decompressive surgery  . Chronic kidney disease (CKD), stage III (moderate)   . Blood transfusion without reported diagnosis   . Dyslipidemia   . Parkinson disease   . GI bleed   . Coronary artery disease   . PPD positive     internal hemorrhoids  . Depression   . Osteoporosis     Past Surgical History  Procedure Laterality Date  . Replacement total knee  2002    right  . Nose surgery    . Ankle surgery      left  . Toe amputation      right foot  . Cataract extraction    . Carpal tunnel release      left, open rt  . Tonsillectomy    . Appendectomy    . Coronary angioplasty with stent placement    . Back surgery  2006     back x3  . Deep brain stimulator placement  02    batteries changed 07  . Subthalamic stimulator battery replacement  02/27/2011    Procedure: SUBTHALAMIC STIMULATOR BATTERY REPLACEMENT;  Surgeon: Peggyann Shoals, MD;  Location: [facility];  Service: Neurosurgery;  Laterality: Left;  DBS battery replacment x 2 - Left Chest and Abdomen  . Eye muscle surgery      bilat  . Subthalamic stimulator removal  05/22/2011    Procedure: SUBTHALAMIC STIMULATOR REMOVAL;  Surgeon:  Erline Levine, MD;  Location: [facility];  Service: Neurosurgery;  Laterality: N/A;  removal of IPG  . Subthalamic stimulator removal  07/24/2011    Procedure: SUBTHALAMIC STIMULATOR REMOVAL;  Surgeon: Erline Levine, MD;  Location: [place] [facility] OR[facility];  Service: Neurosurgery;  Laterality: N/A;  Deep Brain stimulator hardware removal  . Foot amputation through metatarsal      right  . Spine surgery      Current Outpatient Prescriptions  Medication Sig Dispense Refill  .  albuterol (PROVENTIL) (2.5 MG/3ML) 0.083% nebulizer solution Take 3 mLs by nebulization every 6 (six) hours as needed for wheezing or shortness of breath.      Marland Kitchen amantadine (SYMMETREL) 100 MG capsule Take 1 capsule (100 mg total) by mouth 2 (two) times daily. Take in AM and at lunch time.  60 capsule  5  . aspirin 81 MG tablet Take 81 mg by mouth daily.      . carbidopa-levodopa (SINEMET) 25-250 MG per tablet Take 1 tablet by mouth 5 (five) times daily. The first dose in a.m. with orange juice,  continue giving medication every 4-5 hours after initial morning doors. Suggested are 8 AM, 12 noon, 4 PM, 8 PM and a late nighttime dose.  150 tablet  5  . dextromethorphan-guaiFENesin (DIABETIC TUSSIN DM) 10-100 MG/5ML liquid Take 10 mLs by mouth every 4 (four) hours as needed for cough.      Marland Kitchen HYDROcodone-acetaminophen (NORCO/VICODIN) 5-325 MG per tablet Take one tablet by mouth three times daily; Take one tablet by mouth every 6 hours as needed for mild to moderate pain; Take two tablets by mouth every 6 hours as needed for severe pain  360 tablet  0  . Ipratropium-Albuterol (DUONEB IN) Inhale into the lungs as needed.      . latanoprost (XALATAN) 0.005 % ophthalmic solution Place 1 drop into the left eye 2 (two) times daily.      Marland Kitchen LORazepam (ATIVAN) 0.5 MG tablet Take 1/2 tablet by mouth twice daily for anxiety  30 tablet  5  . midodrine (PROAMATINE) 5 MG tablet Take 5 mg by mouth 2 (two) times daily.       Marland Kitchen PARoxetine (PAXIL) 30 MG tablet  Take 30 mg by mouth daily.      . polyethylene glycol (MIRALAX / GLYCOLAX) packet Take 17 g by mouth daily. In 8 ounces daily      . pramipexole (MIRAPEX) 0.5 MG tablet Take 0.5 mg by mouth 3 (three) times daily.       Marland Kitchen pyridOXINE (VITAMIN B-6) 50 MG tablet Take 50 mg by mouth daily.      Orlie Dakin Sodium (SENOKOT S PO) Take by mouth at bedtime.      Marland Kitchen zolpidem (AMBIEN) 5 MG tablet Take one tablet by mouth every night at bedtime for rest  30 tablet  5  . [DISCONTINUED] clonazePAM (KLONOPIN) 0.5 MG tablet Take 0.5 mg by mouth at bedtime as needed.       No current facility-administered medications for this visit.    Allergies as of 11/30/2013 - Review Complete 11/30/2013  Allergen Reaction Noted  . Morphine Other (See Comments) 04/29/2010  . Nitroglycerin Other (See Comments) 09/12/2011  . Risperidone and related Other (See Comments) 02/17/2011  . Tuberculin tests Other (See Comments) 06/27/2011  . Vancomycin Rash 05/27/2011    Vitals: BP 119/77  Pulse 90  Resp 16  Wt 209 lb (94.802 kg) Last Weight:  Wt Readings from Last 1 Encounters:  11/30/13 209 lb (94.802 kg)   Last Height:   Ht Readings from Last 1 Encounters:  11/25/13 5\' 11"  (1.803 m)     Physical exam: Exam: Gen: NAD, conversant Eyes: anicteric sclerae, moist conjunctivae HENT: Atraumatic, oropharynx clear Neck: Trachea midline; supple,  Lungs: CTA, no wheezing, rales, rhonic                          CV: RRR, no MRG Abdomen: Soft, non-tender;  Extremities:  No peripheral edema  Skin: Normal temperature, no rash,  Psych: Appropriate affect, pleasant  Neuro: MS:  MMSE 23/30 at prior visit  Cranial nerves: There is good facial symmetry. There is facial hypomima. Pupils are equal round and reactive to light bilaterally. ptosis in the left.  The speech is fluent, hypophonic and mildly dysarthric. Soft palate has limited Rise - there is no tongue deviation. tremor of the tongue.  Hearing is intact  to conversational tone.  Sensation: Sensation is intact to light and pinprick throughout (facial, trunk, extremities). Vibration is markedly decreased distally.   There is no sensory dermatomal level identified.   Motor: Strength is 5/5 in the bilateral upper and lower extremities.  Noted left laterocolis and right torticolis, tenderness to palpation in bilateral trapezius and left levator scapulae. The patient has a limited ROM in the left shoulder, has pain with passive testing of muscle tome.    Deep tendon reflexes: Deep tendon reflexes are 0/4 at the bilateral biceps, triceps, brachioradialis, patella and achilles. Plantar responses are downgoing bilaterally.   Movement examination:  There is a moderate right upper extremity resting tremor. There is an intermittent left upper extremity resting tremor.  The patient has a limited ROM in the left shoulder, has pain with passive testing of muscle tome.  There is a chin tremor.  The nearly continuous dyskinesias of extremities > trunk,  noted throughout the last exam have not been present..  Marked bradykinesia with finger taps, hand opening/closing, strong grip.   bluish discolored fingers.   Cold to touch( amantadine).  Gait and Station: The patient remains  unable to arising out of a chair even with maximal assistance.   Assessment:  After physical and neurologic examination, review of laboratory studies, imaging, neurophysiology testing and pre-existing records, assessment :  PD, advanced and minimally responsive now to sinemet, causing hyperkinesis instead, Rigor and dystonia, kinesia.- his baseline tone improved on amantadine.  Mirapex is still needed 5 times a day, as the discontinuation did not affect visual hallucinations.  1)PD 2)Cervical dystonia 3)Sialorrhea 4)Cognitive decline is not seen today , his MMSE was 24-30, patient is handicapped in drawing and writing.    Plan:  Treatment plan and additional workup will be  reviewed under Problem List.    80y/o gentleman with PD presenting for follow up / PD evaluation. He had a DBS in the past but it was removed in 2012 due to infection during a battery change. Will continue Sinemet 25-250 to 5x a day at 6am, 10am, 2pm, 6pm, 10pm. Mirapex and amantadine. Will hold off on botox injections for sialorrhea and CD as symptoms are stable at this time.   I like to continue all meds as they are, Dr. Darcella Cheshire'  wife moved to friend's home [employer] [facility] and he would like to move there , too. Since he is medicaid the new institutions are hesitant to take him on.   Follow up with me, Dr Brett Fairy,  every  4- 5  months.    Larey Seat , MD  [facility] [address] [place], [place] [address]  Phone [phone] Fax [phone]

## 2013-11-30 NOTE — Patient Instructions (Signed)
Regular medication intake is of most importance for this parkinson's patient.

## 2014-01-10 ENCOUNTER — Other Ambulatory Visit: Payer: Self-pay | Admitting: *Deleted

## 2014-01-10 MED ORDER — HYDROCODONE-ACETAMINOPHEN 5-325 MG PO TABS: ORAL_TABLET | ORAL | Status: DC

## 2014-01-10 NOTE — Telephone Encounter (Signed)
[facility] 

## 2014-03-02 LAB — CBC AND DIFFERENTIAL
HCT: 38 % — AB (ref 41–53)
Hemoglobin: 11.8 g/dL — AB (ref 13.5–17.5)
Platelets: 156 10*3/uL (ref 150–399)
WBC: 8.3 10^3/mL

## 2014-03-02 LAB — BASIC METABOLIC PANEL
BUN: 25 mg/dL — AB (ref 4–21)
CREATININE: 1.4 mg/dL — AB (ref <=1.3)
Glucose: 91 mg/dL
POTASSIUM: 4.2 mmol/L (ref 3.4–5.3)

## 2014-03-20 ENCOUNTER — Other Ambulatory Visit: Payer: Self-pay

## 2014-03-20 MED ORDER — HYDROCODONE-ACETAMINOPHEN 5-325 MG PO TABS: ORAL_TABLET | ORAL | Status: DC

## 2014-03-20 NOTE — Telephone Encounter (Signed)
Rx faxed to [facility] @ [phone], phone number [phone] TID= 90 + q6 hours(120) = 210

## 2014-03-29 ENCOUNTER — Ambulatory Visit (INDEPENDENT_AMBULATORY_CARE_PROVIDER_SITE_OTHER): Admitting: Nurse Practitioner

## 2014-03-29 ENCOUNTER — Encounter: Payer: Self-pay | Admitting: Nurse Practitioner

## 2014-03-29 VITALS — BP 153/88 | HR 100

## 2014-03-29 DIAGNOSIS — R269 Unspecified abnormalities of gait and mobility: Secondary | ICD-10-CM

## 2014-03-29 DIAGNOSIS — R5381 Other malaise: Secondary | ICD-10-CM

## 2014-03-29 DIAGNOSIS — G20 Parkinson's disease: Secondary | ICD-10-CM

## 2014-03-29 NOTE — Patient Instructions (Signed)
Per nursing home sheet 

## 2014-03-29 NOTE — Progress Notes (Signed)
[facility]  PATIENT: [name] DOB: 08/12/1929   REASON FOR VISIT: Follow-up for progressive Parkinson's disease  HISTORY FROM: Wife and patient    HISTORY OF PRESENT ILLNESS: [name] is a 81 y.o. male here as a referral from Dr. Elease Hashimoto and was  last seen by Dr. Brett Fairy 11/30/2013 .Dr Janann Colonel. 2nd opinion of his PD. - he is living at [facility] and on Hospice support- nobody feels competent enough to refill and adjust the pump for  Duopa. He is holding off at this time. Continues with intermittent visiual hallucinations.  Sees animal and people that are not there, this occurs randomly throughout the day and is not worse at night. This is not frightening to him He is currently taking SInemet 25-250 5x a day,  Mirapex  0.5mg , 5x a day and amantadine 100mg  BID.He has lost weight. He reports that his appetite is good. He can only ambulate with assistance. He is seated in a wheelchair today.   HISTORY: Originally diagnosed with PD in 1987, had DBS placed in 2002, unfortunately caught a mycotic infection with battery change in 2012 and device had to be removed. Main concerns recently have been increased drooling and orthostatic hypotension. At last visit with Dr Brett Fairy the Sinemet 25-250 was increased to 5x a day at 6am, noon, 4pm, 8pm and midnight. They are unsure if he is getting the medication at these set times. He is also taking Mirapex 0.5mg  5x a day. He is currently taking the amantadine 100mg  twice a day. He is also currently taking midodrine 5mg  twice a day.   He is currently living in a nursing home and is wheelchair bound. His main concerns today are lack of exercise and rehab. He expresses a strong desire to ambulate more. His wife notes he has not been home or ambulating freely since March 0350 due to complications from the DBS infection. He is unsure if they notice any benefit from the Sinemet. They do notice more dyskinesias, especially since  increasing the Sinemet to 5x a day. He does note frequent wearing off and freezing episodes.   REVIEW OF SYSTEMS: Full 14 system review of systems performed and notable only for those listed, all others are neg:  Constitutional: neg  Cardiovascular: neg Ear/Nose/Throat: Drooling Skin: neg Eyes: neg Respiratory: neg Gastroitestinal: Urinary frequency Hematology/Lymphatic: neg  Endocrine: neg Musculoskeletal: Wheelchair-bound Allergy/Immunology: neg Neurological: Speech difficulty Psychiatric: Occasional hallucinations Sleep : neg   ALLERGIES: Allergies  Allergen Reactions  . Morphine Other (See Comments)    unknown  . Nitroglycerin Other (See Comments)    unknown  . Risperidone And Related Other (See Comments)    unknown  . Tuberculin Tests Other (See Comments)    unknown  . Vancomycin Rash    HOME MEDICATIONS: Outpatient Prescriptions Prior to Visit  Medication Sig Dispense Refill  . albuterol (PROVENTIL) (2.5 MG/3ML) 0.083% nebulizer solution Take 3 mLs by nebulization every 6 (six) hours as needed for wheezing or shortness of breath.    Marland Kitchen amantadine (SYMMETREL) 100 MG capsule Take one tab in AM and one tab  at lunch time. 60 capsule 5  . aspirin 81 MG tablet Take 81 mg by mouth daily.    . carbidopa-levodopa (SINEMET) 25-250 MG per tablet Take 1 tablet by mouth 5 (five) times daily. The first dose in a.m. with orange juice,  continue giving medication every 4-5 hours after initial morning doors. Suggested are 8 AM, 12 noon, 4 PM, 8 PM and  a late nighttime dose. 150 tablet 5  . dextromethorphan-guaiFENesin (DIABETIC TUSSIN DM) 10-100 MG/5ML liquid Take 10 mLs by mouth every 4 (four) hours as needed for cough.    Marland Kitchen HYDROcodone-acetaminophen (NORCO/VICODIN) 5-325 MG per tablet Take one tablet by mouth three times daily for pain; Take one tablet by mouth every 6 hours as needed for pain 210 tablet 0  . Ipratropium-Albuterol (DUONEB IN) Inhale into the lungs as needed.    .  latanoprost (XALATAN) 0.005 % ophthalmic solution Place 1 drop into the left eye 2 (two) times daily.    Marland Kitchen LORazepam (ATIVAN) 0.5 MG tablet Take 1/2 tablet by mouth twice daily for anxiety 30 tablet 5  . midodrine (PROAMATINE) 5 MG tablet Take 5 mg by mouth 2 (two) times daily.     Marland Kitchen PARoxetine (PAXIL) 30 MG tablet Take 30 mg by mouth daily.    . polyethylene glycol (MIRALAX / GLYCOLAX) packet Take 17 g by mouth daily. In 8 ounces daily    . pramipexole (MIRAPEX) 0.5 MG tablet Take 1 tablet (0.5 mg total) by mouth 3 (three) times daily. 90 tablet 5  . Sennosides-Docusate Sodium (SENOKOT S PO) Take by mouth at bedtime.    Marland Kitchen zolpidem (AMBIEN) 5 MG tablet Take one tablet by mouth every night at bedtime for rest 30 tablet 5   No facility-administered medications prior to visit.    PAST MEDICAL HISTORY: Past Medical History  Diagnosis Date  . HYPERLIPIDEMIA-MIXED 02/10/2008  . PARKINSON'S DISEASE 02/10/2008  . HYPERTENSION, BENIGN 02/09/2009  . GASTROINTESTINAL HEMORRHAGE, HX OF 02/10/2008  . ANEMIA 04/29/2010  . FIBRILLATION, ATRIAL 04/29/2010  . VENTRAL HERNIA 04/29/2010  . DEGENERATIVE JOINT DISEASE 04/29/2010  . Gastric ulcer   . Positive PPD   . Internal hemorrhoids   . Hyperplastic colon polyp 2004  . CAD (coronary artery disease)     23.0 x 24 mm Taxus DES mid LAD 2005, [place] diagonal branch 2005  . VENOUS INSUFFICIENCY 02/10/2008    rt  . Shortness of breath   . Osteomyelitis   . Calculus of kidney   . Tuberculosis     81 yrs old  . GERD (gastroesophageal reflux disease)     ulcers hx  . Infection and inflammatory reaction due to device, implant, and graft   . Neuromuscular disorder   . Sundowning     past lumbar decompressive surgery  . Chronic kidney disease (CKD), stage III (moderate)   . Blood transfusion without reported diagnosis   . Dyslipidemia   . Parkinson disease   . GI bleed   . Coronary artery disease   . PPD positive     internal hemorrhoids  . Depression   .  Osteoporosis     PAST SURGICAL HISTORY: Past Surgical History  Procedure Laterality Date  . Replacement total knee  2002    right  . Nose surgery    . Ankle surgery      left  . Toe amputation      right foot  . Cataract extraction    . Carpal tunnel release      left, open rt  . Tonsillectomy    . Appendectomy    . Coronary angioplasty with stent placement    . Back surgery  2006     back x3  . Deep brain stimulator placement  02    batteries changed 07  . Subthalamic stimulator battery replacement  02/27/2011    Procedure: SUBTHALAMIC STIMULATOR BATTERY REPLACEMENT;  Surgeon:  Peggyann Shoals, MD;  Location: [facility];  Service: Neurosurgery;  Laterality: Left;  DBS battery replacment x 2 - Left Chest and Abdomen  . Eye muscle surgery      bilat  . Subthalamic stimulator removal  05/22/2011    Procedure: SUBTHALAMIC STIMULATOR REMOVAL;  Surgeon: Erline Levine, MD;  Location: [facility];  Service: Neurosurgery;  Laterality: N/A;  removal of IPG  . Subthalamic stimulator removal  07/24/2011    Procedure: SUBTHALAMIC STIMULATOR REMOVAL;  Surgeon: Erline Levine, MD;  Location: [facility];  Service: Neurosurgery;  Laterality: N/A;  Deep Brain stimulator hardware removal  . Foot amputation through metatarsal      right  . Spine surgery      FAMILY HISTORY: Family History  Problem Relation Age of Onset  . Parkinsonism Sister   . Diabetes Brother   . Stroke Brother   . Crohn's disease Daughter   . Heart disease Paternal Grandfather   . Colon cancer Neg Hx     SOCIAL HISTORY: History   Social History  . Marital Status: Married    Spouse Name: [name]    Number of Children: 4  . Years of Education: PHD   Occupational History  . retired Education officer, museum professor    Social History Main Topics  . Smoking status: Never Smoker   . Smokeless tobacco: Never Used  . Alcohol Use: No  . Drug Use: No  . Sexual Activity: No   Other Topics Concern  . Not on file   Social  History Narrative   Patient is married [name]) and he lives at [facility].   Patient has three living children and one son is deceased.   Patient is retired.   Patient has a PhD.   Patient is right-handed.   Patient drinks coffee very occasionally.     PHYSICAL EXAM  Filed Vitals:   03/29/14 1428  BP: 153/88  Pulse: 100   Cannot calculate BMI with a height equal to zero. Gen: NAD, conversant Eyes: anicteric sclerae, moist conjunctivae HENT: Atraumatic, oropharynx clear Neck:  supple,  Lungs: CTA, no wheezing, rales, rhonic  CV: RRR, no MRG Extremities: No peripheral edema  Skin: Normal temperature, no rash,  Psych: Appropriate affect, pleasant  Neuro: MS: Alert and appropriate  Cranial nerves: There is good facial symmetry. There is facial hypomima. Pupils are equal round and reactive to light bilaterally. ptosis in the left.  The speech is fluent, hypophonic and mildly dysarthric. Soft palate has limited Rise - there is no tongue deviation. tremor of the tongue.  Hearing is intact to conversational tone.  Sensation: Sensation is intact to light and pinprick throughout (facial, trunk, extremities). Vibration is markedly decreased distally.     Motor: Strength is 5/5 in the bilateral upper and lower extremities.  Noted left laterocolis and right torticolis, tenderness to palpation in bilateral trapezius and left levator scapulae. The patient has a limited ROM in the left shoulder, has pain with passive testing of muscle tone.  Deep tendon reflexes: Deep tendon reflexes are 0/4 at the bilateral biceps, triceps, brachioradialis, patella and achilles. Plantar responses are downgoing bilaterally.   Movement examination:  There is a moderate right upper extremity resting tremor. There is an intermittent left upper extremity resting tremor. There is a chin tremor.  Some dyskinesias of extremities > trunk,  Marked bradykinesia with finger  taps, hand opening/closing, strong grip.  bluish discolored fingers.  Gait and Station: The patient remains unable to arising out  of a chair even with maximal assistance.   DIAGNOSTIC DATA (LABS, IMAGING, TESTING) - I reviewed patient records, labs, notes, testing and imaging myself where available.  Lab Results  Component Value Date   WBC 7.6 05/07/2012   HGB 12.9* 05/07/2012   HCT 38.1* 05/07/2012   MCV 91.3 05/07/2012   PLT 183 05/07/2012      Component Value Date/Time   NA 138 05/07/2012 1043   NA 140 09/19/2011 1440   K 3.9 05/07/2012 1043   K 4.1 09/19/2011 1440   CL 104 05/07/2012 1043   CL 108 09/19/2011 1440   CO2 24 05/07/2012 1043   CO2 23 09/19/2011 1440   GLUCOSE 103* 05/07/2012 1043   GLUCOSE 118* 09/19/2011 1440   BUN 24.8 05/07/2012 1043   BUN 30* 09/19/2011 1440   CREATININE 1.4* 05/07/2012 1043   CREATININE 1.31 09/19/2011 1440   CALCIUM 9.3 05/07/2012 1043   CALCIUM 8.8 09/19/2011 1440   PROT 7.7 05/07/2012 1043   PROT 7.0 09/13/2011 0620   ALBUMIN 3.6 05/07/2012 1043   ALBUMIN 3.5 09/13/2011 0620   AST 19 05/07/2012 1043   AST 19 09/13/2011 0620   ALT <6 Repeated and Verified 05/07/2012 1043   ALT 6 09/13/2011 0620   ALKPHOS 85 05/07/2012 1043   ALKPHOS 90 09/13/2011 0620   BILITOT 0.31 05/07/2012 1043   BILITOT 0.5 09/13/2011 0620   GFRNONAA 58* 09/14/2011 0734   GFRAA 72* 09/14/2011 0734       ASSESSMENT AND PLAN  81 y.o. year old male  has a past medical history of advanced Parkinson's disease which is minimally responsive to Sinemet. He remains on amantadine and Mirapex.  Continue current meds Follow-up 6-8 months, next with Dr. Brett Fairy Dennie Bible, [facility], [facility], [facility] [address], [place], [place] [address] [phone]

## 2014-03-31 ENCOUNTER — Ambulatory Visit: Admitting: Cardiovascular Disease

## 2014-04-07 ENCOUNTER — Encounter: Payer: Self-pay | Admitting: Adult Health

## 2014-04-07 ENCOUNTER — Encounter: Payer: Self-pay | Admitting: Cardiovascular Disease

## 2014-04-07 ENCOUNTER — Ambulatory Visit (INDEPENDENT_AMBULATORY_CARE_PROVIDER_SITE_OTHER): Admitting: Cardiovascular Disease

## 2014-04-07 ENCOUNTER — Non-Acute Institutional Stay (SKILLED_NURSING_FACILITY): Payer: Medicare Other | Admitting: Adult Health

## 2014-04-07 VITALS — BP 122/80 | HR 74

## 2014-04-07 DIAGNOSIS — Z8719 Personal history of other diseases of the digestive system: Secondary | ICD-10-CM

## 2014-04-07 DIAGNOSIS — F32A Depression, unspecified: Secondary | ICD-10-CM

## 2014-04-07 DIAGNOSIS — I2510 Atherosclerotic heart disease of native coronary artery without angina pectoris: Secondary | ICD-10-CM

## 2014-04-07 DIAGNOSIS — I480 Paroxysmal atrial fibrillation: Secondary | ICD-10-CM

## 2014-04-07 DIAGNOSIS — G4700 Insomnia, unspecified: Secondary | ICD-10-CM

## 2014-04-07 DIAGNOSIS — J309 Allergic rhinitis, unspecified: Secondary | ICD-10-CM

## 2014-04-07 DIAGNOSIS — G20 Parkinson's disease: Secondary | ICD-10-CM

## 2014-04-07 DIAGNOSIS — I951 Orthostatic hypotension: Secondary | ICD-10-CM

## 2014-04-07 DIAGNOSIS — K5900 Constipation, unspecified: Secondary | ICD-10-CM

## 2014-04-07 DIAGNOSIS — F419 Anxiety disorder, unspecified: Secondary | ICD-10-CM

## 2014-04-07 DIAGNOSIS — F329 Major depressive disorder, single episode, unspecified: Secondary | ICD-10-CM

## 2014-04-07 NOTE — Progress Notes (Signed)
Patient ID: [name], male   DOB: August 27, 1929, 81 y.o.   MRN: [id number]   04/07/2014  Facility:  Nursing Home Location:  [facility] Room Number: 700-1 LEVEL OF CARE:  SNF (31)  Routine Visit  Chief Complaint  Patient presents with  . Medical Management of Chronic Issues    CAD, Parkinson's disease, history of GI bleed, atrial fibrillation, depression, allergic rhinitis, constipation, anxiety, insomnia and hypotension    HISTORY OF PRESENT ILLNESS:  This is an 81 year old male was being seen on a routine visit. He is a long-term care resident at [facility] and has been stable for the past month. He has medical history of atrial fibrillation but not on anticoagulant due to history of GI bleed, Parkinson's disease, orthostatic hypotension and depression.  PAST MEDICAL HISTORY:  Past Medical History  Diagnosis Date  . HYPERLIPIDEMIA-MIXED 02/10/2008  . PARKINSON'S DISEASE 02/10/2008  . HYPERTENSION, BENIGN 02/09/2009  . GASTROINTESTINAL HEMORRHAGE, HX OF 02/10/2008  . ANEMIA 04/29/2010  . FIBRILLATION, ATRIAL 04/29/2010  . VENTRAL HERNIA 04/29/2010  . DEGENERATIVE JOINT DISEASE 04/29/2010  . Gastric ulcer   . Positive PPD   . Internal hemorrhoids   . Hyperplastic colon polyp 2004  . CAD (coronary artery disease)     23.0 x 24 mm Taxus DES mid LAD 2005, [place] diagonal branch 2005  . VENOUS INSUFFICIENCY 02/10/2008    rt  . Shortness of breath   . Osteomyelitis   . Calculus of kidney   . Tuberculosis     81 yrs old  . GERD (gastroesophageal reflux disease)     ulcers hx  . Infection and inflammatory reaction due to device, implant, and graft   . Neuromuscular disorder   . Sundowning     past lumbar decompressive surgery  . Chronic kidney disease (CKD), stage III (moderate)   . Blood transfusion without reported diagnosis   . Dyslipidemia   . Parkinson disease   . GI bleed   . Coronary artery disease   . PPD positive     internal hemorrhoids    . Depression   . Osteoporosis     CURRENT MEDICATIONS: Reviewed per MAR/see medication list  Allergies  Allergen Reactions  . Morphine Other (See Comments)    unknown  . Nitroglycerin Other (See Comments)    unknown  . Risperidone And Related Other (See Comments)    unknown  . Tuberculin Tests Other (See Comments)    unknown  . Vancomycin Rash     REVIEW OF SYSTEMS:  Unable to obtain due to patient being a poor historian  PHYSICAL EXAMINATION  GENERAL: no acute distress EYES: conjunctivae normal, sclerae normal, normal eye lids NECK: supple, trachea midline, no neck masses, no thyroid tenderness, no thyromegaly LYMPHATICS: no LAN in the neck, no supraclavicular LAN RESPIRATORY: breathing is even & unlabored, BS CTAB CARDIAC: RRR, no murmur,no extra heart sounds, no edema GI: abdomen soft, normal BS, no masses, no tenderness, no hepatomegaly, no splenomegaly EXTREMITIES:   Well to move 4 extremities extremities NEURO: + tremors and rigidity; ambulates with wheelchair PSYCHIATRIC: the patient is alert & oriented to person, affect & behavior appropriate  LABS/RADIOLOGY: 03/23/14  chest x-ray shows negative pneumonia 03/02/14  WBC 8.3 hemoglobin 11.8 hematocrit 37.6 MCV and 98.7 sodium 138 potassium 4.2 glucose 91 BUN 26 creatinine 1.4 calcium 8.7 GFR 50.68 12/17/14  total protein 7.4 albumin 4.0 ALP 91 AST 20 ALT 7 12/09/13  WBC 8.9 hemoglobin 12.1 hematocrit  39.4 MCV 97.8 sodium 139 potassium 4.4 glucose 66 BUN 23 creatinine 1.4 calcium 9.4 GFR 53.30 total protein 7.5 albumin 4.0  ALP 96 AST 20 ALT 8 4-15 liver profile normal, CMP normal, hemoglobin 12.3, MCV 96.6 otherwise CBC normal 6/14 hemoglobin 12.8, MCV 91.3 otherwise CBC normal, glucose 102, BUN 24, creatinine 1.3 otherwise CMP normal 3/14 Hb 12.6, mcv 87.9 ow cbc nl 12/13 bun 25, cr 1.42 ow cmp nl  ASSESSMENT/PLAN:  CAD - stable; continue aspirin 81 mg by mouth daily Parkinson's disease - continue Amantadine  100 mg 1 capsule PO BID, Mirapex 0.5 mg by mouth 3 times a day and Sinemet 25-250 milligrams 1 tab by mouth 5X/day; currently followed by Hospice - comfort care Atrial fibrillation - rate controlled; not on any anticoagulant due to history of GI bleed; continue aspirin 81 mg by mouth daily Depression - mood is stable; continue Paxil 30 mg by mouth daily and Wellbutrin 100 mg by mouth daily Allergic rhinitis - stable; continue Claritin 10 mg by mouth daily Constipation - continue senna S 2 tabs by mouth twice a day, MiraLAX 17 g +4-6 ounces liquid by mouth twice a day and lactulose 20 g/30 mL by mouth daily at bedtime Anxiety - mood is stable; continue Ativan 0.5 mg 1/2 tab = 0.25 mg by mouth twice a day and daily when necessary Insomnia - continue Ambien 5 mg 1 tab by mouth daily at bedtime History of GI bleed - continue Prilosec 20 mg by mouth daily Orthostatic hypotension - stable; continue midodrine 5 mg by mouth twice a day when necessary   Goals of care:   long-term care    Labs/test ordered:  none    [facility], NP [employer] [phone]

## 2014-04-07 NOTE — Progress Notes (Signed)
History of Present Illness: 81 yo WM with history of CAD, Parkinsons, GI bleeding, HLD, HTN and paroxysmal atrial fibrillation here today for cardiac follow up. He has been followed in the past by Dr. Olevia Perches. 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 He had a Taxus drug eluting stent placed in the mid LAD in 2005 and had an atherectomy procedure with [facility] of the diagonal branch at the same time per Dr. Olevia Perches. His past history is also significant for Parkinson's and he has a brain stimulator for this and is followed by Dr. Erling Cruz. He also has had toe amputations for osteomyelitis. He has a history of a GI bleed and had to stop his Plavix for this.He had some postural hypotension then and was put on midodrine. I ordered an echo which was overall normal on 03/24/11. His deep brain stimulator has been removed secondary to battery infection.  He is here for follow up. He has been doing well overall. He is now in [facility] on Hospice. He's had no chest pain or palpitations. His ASA was stopped.   Primary Care Physician: Carolann Littler  Last Lipid Profile:Lipid Panel     Component Value Date/Time   CHOL 114 05/21/2011 0956   TRIG 69.0 05/21/2011 0956   HDL 47.10 05/21/2011 0956   CHOLHDL 2 05/21/2011 0956   VLDL 13.8 05/21/2011 0956   LDLCALC 53 05/21/2011 0956     Past Medical History  Diagnosis Date  . HYPERLIPIDEMIA-MIXED 02/10/2008  . PARKINSON'S DISEASE 02/10/2008  . HYPERTENSION, BENIGN 02/09/2009  . GASTROINTESTINAL HEMORRHAGE, HX OF 02/10/2008  . ANEMIA 04/29/2010  . FIBRILLATION, ATRIAL 04/29/2010  . VENTRAL HERNIA 04/29/2010  . DEGENERATIVE JOINT DISEASE 04/29/2010  . Gastric ulcer   . Positive PPD   . Internal hemorrhoids   . Hyperplastic colon polyp 2004  . CAD (coronary artery disease)     23.0 x 24 mm Taxus DES mid LAD 2005, [place] diagonal branch 2005  . VENOUS INSUFFICIENCY 02/10/2008    rt  . Shortness of breath   . Osteomyelitis   . Calculus of kidney   .  Tuberculosis     81 yrs old  . GERD (gastroesophageal reflux disease)     ulcers hx  . Infection and inflammatory reaction due to device, implant, and graft   . Neuromuscular disorder   . Sundowning     past lumbar decompressive surgery  . Chronic kidney disease (CKD), stage III (moderate)   . Blood transfusion without reported diagnosis   . Dyslipidemia   . Parkinson disease   . GI bleed   . Coronary artery disease   . PPD positive     internal hemorrhoids  . Depression   . Osteoporosis     Past Surgical History  Procedure Laterality Date  . Replacement total knee  2002    right  . Nose surgery    . Ankle surgery      left  . Toe amputation      right foot  . Cataract extraction    . Carpal tunnel release      left, open rt  . Tonsillectomy    . Appendectomy    . Coronary angioplasty with stent placement    . Back surgery  2006     back x3  . Deep brain stimulator placement  02    batteries changed 07  . Subthalamic stimulator battery replacement  02/27/2011    Procedure: SUBTHALAMIC STIMULATOR BATTERY REPLACEMENT;  Surgeon:  Peggyann Shoals, MD;  Location: [place] [facility];  Service: Neurosurgery;  Laterality: Left;  DBS battery replacment x 2 - Left Chest and Abdomen  . Eye muscle surgery      bilat  . Subthalamic stimulator removal  05/22/2011    Procedure: SUBTHALAMIC STIMULATOR REMOVAL;  Surgeon: Erline Levine, MD;  Location: [place] [facility] OR[facility];  Service: Neurosurgery;  Laterality: N/A;  removal of IPG  . Subthalamic stimulator removal  07/24/2011    Procedure: SUBTHALAMIC STIMULATOR REMOVAL;  Surgeon: Erline Levine, MD;  Location: [place] NEUR[facility] OR[facility];  Service: Neurosurgery;  Laterality: N/A;  Deep Brain stimulator hardware removal  . Foot amputation through metatarsal      right  . Spine surgery      Current Outpatient Prescriptions  Medication Sig Dispense Refill  . acetaminophen (TYLENOL) 325 MG tablet Take 650 mg by mouth every 6 (six) hours as needed.    Marland Kitchen albuterol  (PROVENTIL) (2.5 MG/3ML) 0.083% nebulizer solution Take 3 mLs by nebulization every 6 (six) hours as needed for wheezing or shortness of breath.    Marland Kitchen amantadine (SYMMETREL) 100 MG capsule Take one tab in AM and one tab  at lunch time. 60 capsule 5  . buPROPion (WELLBUTRIN) 100 MG tablet Take 100 mg by mouth daily.    . carbidopa-levodopa (SINEMET) 25-250 MG per tablet Take 1 tablet by mouth 5 (five) times daily. The first dose in a.m. with orange juice,  continue giving medication every 4-5 hours after initial morning doors. Suggested are 8 AM, 12 noon, 4 PM, 8 PM and a late nighttime dose. 150 tablet 5  . HYDROcodone-acetaminophen (NORCO/VICODIN) 5-325 MG per tablet Take one tablet by mouth three times daily for pain; Take one tablet by mouth every 6 hours as needed for pain 210 tablet 0  . lactulose, encephalopathy, (CHRONULAC) 10 GM/15ML SOLN Take 20 g by mouth at bedtime.    Marland Kitchen latanoprost (XALATAN) 0.005 % ophthalmic solution Place 1 drop into the left eye 2 (two) times daily.    Marland Kitchen loratadine (CLARITIN) 10 MG tablet Take 10 mg by mouth daily.    Marland Kitchen LORazepam (ATIVAN) 0.5 MG tablet Take 1/2 tablet by mouth twice daily for anxiety 30 tablet 5  . midodrine (PROAMATINE) 5 MG tablet Take 5 mg by mouth 2 (two) times daily.     . Multiple Vitamins-Minerals (DECUBI-VITE PO) Take by mouth daily.    Marland Kitchen omeprazole (PRILOSEC) 20 MG capsule Take 1 capsule by mouth daily.    Marland Kitchen PARoxetine (PAXIL) 30 MG tablet Take 30 mg by mouth daily.    . polyethylene glycol (MIRALAX / GLYCOLAX) packet Take 17 g by mouth daily. In 8 ounces daily    . pramipexole (MIRAPEX) 0.5 MG tablet Take 1 tablet (0.5 mg total) by mouth 3 (three) times daily. 90 tablet 5  . pyridOXINE (B-6) 50 MG tablet Take 50 mg by mouth daily.    Orlie Dakin Sodium (SENOKOT S PO) Take by mouth at bedtime.    Marland Kitchen zolpidem (AMBIEN) 5 MG tablet Take one tablet by mouth every night at bedtime for rest 30 tablet 5  . [DISCONTINUED] clonazePAM  (KLONOPIN) 0.5 MG tablet Take 0.5 mg by mouth at bedtime as needed.     No current facility-administered medications for this visit.    Allergies  Allergen Reactions  . Morphine Other (See Comments)    unknown  . Nitroglycerin Other (See Comments)    unknown  . Risperidone And Related Other (See Comments)  unknown  . Tuberculin Tests Other (See Comments)    unknown  . Vancomycin Rash    History   Social History  . Marital Status: Married    Spouse Name: [name]    Number of Children: 4  . Years of Education: PHD   Occupational History  . retired Education officer, museum professor    Social History Main Topics  . Smoking status: Never Smoker   . Smokeless tobacco: Never Used  . Alcohol Use: No  . Drug Use: No  . Sexual Activity: No   Other Topics Concern  . Not on file   Social History Narrative   Patient is married [name]) and he lives at [facility].   Patient has three living children and one son is deceased.   Patient is retired.   Patient has a PhD.   Patient is right-handed.   Patient drinks coffee very occasionally.    Family History  Problem Relation Age of Onset  . Parkinsonism Sister   . Diabetes Brother   . Stroke Brother   . Crohn's disease Daughter   . Heart disease Paternal Grandfather   . Colon cancer Neg Hx     Review of Systems:  As stated in the HPI and otherwise negative.   BP 122/80 mmHg  Pulse 74  Physical Examination: General: Well developed, well nourished, NAD HEENT: OP clear, mucus membranes moist SKIN: warm, dry. No rashes. Neuro: No focal deficits Musculoskeletal: Muscle strength 5/5 all ext Psychiatric: Mood and affect normal Neck: No JVD, no carotid bruits, no thyromegaly, no lymphadenopathy. Lungs:Clear bilaterally, no wheezes, rhonci, crackles Cardiovascular: Regular rate and rhythm. No murmurs, gallops or rubs. Abdomen:Soft. Bowel sounds present. Non-tender.  Extremities: No lower extremity edema. Pulses are 2 + in the  bilateral DP/PT.  EKG: Sinus, rate 74 bpm. Baseline artifact. T wave flattening inferior leads.   Assessment and Plan:   1. CAD: Stable. Continue ASA. He is on Hospice. No other cardiac meds.   2. HTN: BP well controlled. He has orthostatic hypotension on midodrine so this BP is acceptable. No changes.   3. Paroxysmal atrial fibrillation: Sinus today. Not on anti-coagulation secondary to prior GI bleeding.

## 2014-04-07 NOTE — Patient Instructions (Signed)
Your physician wants you to follow-up in:  12 months.  You will receive a reminder letter in the mail two months in advance. If you don't receive a letter, please call our office to schedule the follow-up appointment.   

## 2014-04-13 ENCOUNTER — Ambulatory Visit: Admitting: Physician Assistant

## 2014-05-09 ENCOUNTER — Other Ambulatory Visit: Payer: Self-pay | Admitting: *Deleted

## 2014-05-09 MED ORDER — LORAZEPAM 0.5 MG PO TABS: ORAL_TABLET | ORAL | Status: AC

## 2014-05-09 NOTE — Telephone Encounter (Signed)
[facility] 

## 2014-05-29 ENCOUNTER — Other Ambulatory Visit: Payer: Self-pay | Admitting: *Deleted

## 2014-05-29 MED ORDER — ZOLPIDEM TARTRATE 5 MG PO TABS: ORAL_TABLET | ORAL | Status: AC

## 2014-05-29 NOTE — Telephone Encounter (Signed)
[facility] 

## 2014-06-27 ENCOUNTER — Other Ambulatory Visit: Payer: Self-pay | Admitting: *Deleted

## 2014-06-27 MED ORDER — HYDROCODONE-ACETAMINOPHEN 5-325 MG PO TABS: ORAL_TABLET | ORAL | Status: DC

## 2014-06-27 NOTE — Telephone Encounter (Signed)
[facility] 

## 2014-07-08 ENCOUNTER — Non-Acute Institutional Stay (SKILLED_NURSING_FACILITY): Payer: Medicare Other | Admitting: Adult Health

## 2014-07-08 ENCOUNTER — Encounter: Payer: Self-pay | Admitting: Adult Health

## 2014-07-08 DIAGNOSIS — G20 Parkinson's disease: Secondary | ICD-10-CM | POA: Diagnosis not present

## 2014-07-08 DIAGNOSIS — I480 Paroxysmal atrial fibrillation: Secondary | ICD-10-CM

## 2014-07-08 DIAGNOSIS — J309 Allergic rhinitis, unspecified: Secondary | ICD-10-CM | POA: Diagnosis not present

## 2014-07-08 DIAGNOSIS — F419 Anxiety disorder, unspecified: Secondary | ICD-10-CM

## 2014-07-08 DIAGNOSIS — K5900 Constipation, unspecified: Secondary | ICD-10-CM | POA: Diagnosis not present

## 2014-07-08 DIAGNOSIS — M159 Polyosteoarthritis, unspecified: Secondary | ICD-10-CM

## 2014-07-08 DIAGNOSIS — M150 Primary generalized (osteo)arthritis: Secondary | ICD-10-CM | POA: Diagnosis not present

## 2014-07-08 DIAGNOSIS — Z8719 Personal history of other diseases of the digestive system: Secondary | ICD-10-CM | POA: Diagnosis not present

## 2014-07-08 DIAGNOSIS — F329 Major depressive disorder, single episode, unspecified: Secondary | ICD-10-CM | POA: Diagnosis not present

## 2014-07-08 DIAGNOSIS — I2510 Atherosclerotic heart disease of native coronary artery without angina pectoris: Secondary | ICD-10-CM | POA: Diagnosis not present

## 2014-07-08 DIAGNOSIS — F32A Depression, unspecified: Secondary | ICD-10-CM

## 2014-07-08 DIAGNOSIS — H409 Unspecified glaucoma: Secondary | ICD-10-CM

## 2014-07-08 DIAGNOSIS — G4700 Insomnia, unspecified: Secondary | ICD-10-CM | POA: Diagnosis not present

## 2014-07-08 NOTE — Progress Notes (Signed)
Patient ID: [name], male   DOB: 02-29-1930, 81 y.o.   MRN: [id number]   07/08/2014  Facility:  Nursing Home Location:  [place] Room Number: 086-7 LEVEL OF CARE:  SNF ([id number])   Chief Complaint  Patient presents with  . Medical Management of Chronic Issues    CAD, Parkinson's disease, PAF, depression, allergic rhinitis, anxiety, insomnia, history of GI bleed, constipation and glaucoma    HISTORY OF PRESENT ILLNESS:  This is an 81 year old male was being seen on a routine visit. He is a long-term care resident at [facility]. He has medical history of atrial fibrillation but not on anticoagulant due to history of GI bleed, Parkinson's disease, orthostatic hypotension and depression. His heart rate has been well controlled and not on any anticoagulation for his PAF due to hx of GI bleed. His mood is stable and continues to take Wellbutrin and Paxil for his Depression. He currently takes Ambien 5 mg @ bedtime and has been having good sleep @ night. Wife visits regularly.  PAST MEDICAL HISTORY:  Past Medical History  Diagnosis Date  . HYPERLIPIDEMIA-MIXED 02/10/2008  . PARKINSON'S DISEASE 02/10/2008  . HYPERTENSION, BENIGN 02/09/2009  . GASTROINTESTINAL HEMORRHAGE, HX OF 02/10/2008  . ANEMIA 04/29/2010  . FIBRILLATION, ATRIAL 04/29/2010  . VENTRAL HERNIA 04/29/2010  . DEGENERATIVE JOINT DISEASE 04/29/2010  . Gastric ulcer   . Positive PPD   . Internal hemorrhoids   . Hyperplastic colon polyp 2004  . CAD (coronary artery disease)     23.0 x 24 mm Taxus DES mid LAD 2005, [place] diagonal branch 2005  . VENOUS INSUFFICIENCY 02/10/2008    rt  . Shortness of breath   . Osteomyelitis   . Calculus of kidney   . Tuberculosis     81 yrs old  . GERD (gastroesophageal reflux disease)     ulcers hx  . Infection and inflammatory reaction due to device, implant, and graft   . Neuromuscular disorder   . Sundowning     past lumbar decompressive surgery  . Chronic  kidney disease (CKD), stage III (moderate)   . Blood transfusion without reported diagnosis   . Dyslipidemia   . Parkinson disease   . GI bleed   . Coronary artery disease   . PPD positive     internal hemorrhoids  . Depression   . Osteoporosis     CURRENT MEDICATIONS: Reviewed per MAR/see medication list  Allergies  Allergen Reactions  . Morphine Other (See Comments)    unknown  . Nitroglycerin Other (See Comments)    unknown  . Risperidone And Related Other (See Comments)    unknown  . Tuberculin Tests Other (See Comments)    unknown  . Vancomycin Rash     REVIEW OF SYSTEMS:  Unable to obtain due to patient being a poor historian  PHYSICAL EXAMINATION  GENERAL: no acute distress MOUTH:  No lesion nor bleeding NECK: supple, trachea midline, no neck masses, no thyroid tenderness, no thyromegaly LYMPHATICS: no LAN in the neck, no supraclavicular LAN RESPIRATORY: breathing is even & unlabored, BS CTAB CARDIAC: RRR, no murmur,no extra heart sounds, no edema GI: abdomen soft, normal BS, no masses, no tenderness, no hepatomegaly, no splenomegaly EXTREMITIES:   Well to move 4 extremities extremities NEURO: + tremors and rigidity; ambulates with wheelchair PSYCHIATRIC: the patient is alert & oriented to person, affect & behavior appropriate  LABS/RADIOLOGY: Labs reviewed:   03/23/14  chest x-ray shows negative pneumonia 03/02/14  WBC 8.3 hemoglobin 11.8 hematocrit 37.6 MCV and 98.7 sodium 138 potassium 4.2 glucose 91 BUN 26 creatinine 1.4 calcium 8.7 GFR 50.68 12/17/14  total protein 7.4 albumin 4.0 ALP 91 AST 20 ALT 7 12/09/13  WBC 8.9 hemoglobin 12.1 hematocrit 39.4 MCV 97.8 sodium 139 potassium 4.4 glucose 66 BUN 23 creatinine 1.4 calcium 9.4 GFR 53.30 total protein 7.5 albumin 4.0  ALP 96 AST 20 ALT 8 4-15 liver profile normal, CMP normal, hemoglobin 12.3, MCV 96.6 otherwise CBC normal 6/14 hemoglobin 12.8, MCV 91.3 otherwise CBC normal, glucose 102, BUN 24, creatinine  1.3 otherwise CMP normal 3/14 Hb 12.6, mcv 87.9 ow cbc nl 12/13 bun 25, cr 1.42 ow cmp nl  ASSESSMENT/PLAN:  CAD - stable; continue aspirin 81 mg by mouth daily Parkinson's disease - continue Amantadine 100 mg 1 capsule PO BID, Mirapex 0.5 mg by mouth 3 times a day and Sinemet 25-250 milligrams 1 tab by mouth 5X/day; currently followed by Hospice - comfort care Atrial fibrillation - rate controlled; not on any anticoagulant due to history of GI bleed; continue aspirin 81 mg by mouth daily Depression - mood is stable; continue Paxil 30 mg by mouth daily and Wellbutrin 100 mg by mouth daily Allergic rhinitis - stable; continue Claritin 10 mg by mouth daily Constipation - continue senna S 2 tabs by mouth twice a day, MiraLAX 17 g +4-6 ounces liquid by mouth twice a day and lactulose 20 g/30 mL by mouth daily at bedtime Anxiety - mood is stable; continue Ativan 0.5 mg 1/2 tab = 0.25 mg by mouth twice a day and daily when necessary Insomnia - continue Ambien 5 mg 1 tab by mouth daily at bedtime History of GI bleed - continue Prilosec 20 mg by mouth daily Orthostatic hypotension - stable; continue midodrine 5 mg by mouth twice a day when necessary Glaucoma of left eye - continue Xalatan 0.005% instill 1 gtt to left eye Q HS   Goals of care:   long-term care   Labs/test ordered:  CBC, CMP    [place], NP [place]

## 2014-07-27 ENCOUNTER — Other Ambulatory Visit: Payer: Self-pay | Admitting: *Deleted

## 2014-07-27 MED ORDER — HYDROCODONE-ACETAMINOPHEN 5-325 MG PO TABS: ORAL_TABLET | ORAL | Status: AC

## 2014-07-27 NOTE — Telephone Encounter (Signed)
[facility] 

## 2014-08-08 ENCOUNTER — Encounter: Payer: Self-pay | Admitting: Adult Health

## 2014-08-08 ENCOUNTER — Non-Acute Institutional Stay (SKILLED_NURSING_FACILITY): Payer: Medicare Other | Admitting: Adult Health

## 2014-08-08 ENCOUNTER — Encounter: Payer: Self-pay | Admitting: Neurology

## 2014-08-08 ENCOUNTER — Ambulatory Visit (INDEPENDENT_AMBULATORY_CARE_PROVIDER_SITE_OTHER): Admitting: Neurology

## 2014-08-08 VITALS — BP 102/60 | HR 60 | Resp 18

## 2014-08-08 DIAGNOSIS — F32A Depression, unspecified: Secondary | ICD-10-CM

## 2014-08-08 DIAGNOSIS — G20A1 Parkinson's disease without dyskinesia, without mention of fluctuations: Secondary | ICD-10-CM | POA: Insufficient documentation

## 2014-08-08 DIAGNOSIS — M150 Primary generalized (osteo)arthritis: Secondary | ICD-10-CM

## 2014-08-08 DIAGNOSIS — G4700 Insomnia, unspecified: Secondary | ICD-10-CM

## 2014-08-08 DIAGNOSIS — G20 Parkinson's disease: Secondary | ICD-10-CM

## 2014-08-08 DIAGNOSIS — J309 Allergic rhinitis, unspecified: Secondary | ICD-10-CM | POA: Diagnosis not present

## 2014-08-08 DIAGNOSIS — M159 Polyosteoarthritis, unspecified: Secondary | ICD-10-CM

## 2014-08-08 DIAGNOSIS — K5900 Constipation, unspecified: Secondary | ICD-10-CM | POA: Diagnosis not present

## 2014-08-08 DIAGNOSIS — Z8719 Personal history of other diseases of the digestive system: Secondary | ICD-10-CM

## 2014-08-08 DIAGNOSIS — I2510 Atherosclerotic heart disease of native coronary artery without angina pectoris: Secondary | ICD-10-CM | POA: Diagnosis not present

## 2014-08-08 DIAGNOSIS — I480 Paroxysmal atrial fibrillation: Secondary | ICD-10-CM

## 2014-08-08 DIAGNOSIS — H409 Unspecified glaucoma: Secondary | ICD-10-CM | POA: Diagnosis not present

## 2014-08-08 DIAGNOSIS — F329 Major depressive disorder, single episode, unspecified: Secondary | ICD-10-CM

## 2014-08-08 DIAGNOSIS — F419 Anxiety disorder, unspecified: Secondary | ICD-10-CM

## 2014-08-08 DIAGNOSIS — I951 Orthostatic hypotension: Secondary | ICD-10-CM

## 2014-08-08 NOTE — Progress Notes (Signed)
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 Elease Hashimoto and was  last seen by Dr. Brett Fairy 11/30/2013 .Dr Janann Colonel.2nd opinion of his PD. -   08-08-14, CD  he is living at [facility] and on Hospice support- nobody felt competent enough to refill and adjust the pump for Duopa. He is holding off at this time.  Continues with intermittent visiual hallucinations.  Sees animal and people that are not there, this occurs randomly throughout the day and is not worse at night.  This is not frightening to him He is currently taking SInemet 25-250 about  5x a day,his environment  Does not provide for regular intake times.  Mirapex  0.5mg , 5x a day and amantadine 100mg  BID. He has lost weight. He reports that his appetite is good but he cannot eat as fast as his food cools he has lost hand grip and has high amplitude tremor, needs help with feeding.   He can only ambulate 2-3 steps, but had no recent opportunity to walk, there is no regular attempt to mobilize him, as he needs 2 persons for assistance. He has no PT , as he is on hospice?   anything he could still do 18 month ago , he now needs  assistance. He is seated in a wheelchair today. He is followed by Joylene Draft, MD .  I would still consider him a duo-dopa candidate, but with his lack of dexterity he would still require an assistance.  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 He does usually have rather troublesome freezing episodes before the next all doses given. High rigidity he had more off time and he had visual hallucinations. Also [place] he feels  hyperkinetic after taking medication with in about 60 minutes or less of intake time. He has significant ambulatory difficulties not just from Parkinson's disease but from a spinal stenosis as a core morbidity in his case. He does have some memory episodes now that were not initially present.    HISTORY: Originally diagnosed with PD in 1987, had DBS placed in 2002, unfortunately caught a mycotic infection with battery change in 2012 and device had to be removed. Main concerns recently have been increased drooling and orthostatic hypotension. At last visit with Dr Brett Fairy the Sinemet 25-250 was increased to 5x a day at 6am, noon, 4pm, 8pm and midnight. They are unsure if he is getting the medication at these set times. He is also taking Mirapex 0.5mg  5x a day. He is currently taking the amantadine 100mg  twice a day. He is also currently taking midodrine 5mg  twice a day.  He is currently living in at [facility] in a double bedroom, nursing home and is wheelchair bound.  His main concerns today are lack of exercise and rehab. He expresses a strong desire to ambulate more. His wife notes he has not been home or ambulating freely since March 6295 due to complications from the DBS infection. He is unsure if they notice any benefit from the Sinemet. They do notice more dyskinesias, especially since increasing the Sinemet to 5x a day. He does note frequent wearing off  and freezing episodes.   REVIEW OF SYSTEMS: Full 14 system review of systems performed and notable only for those listed, all others are neg:  Constitutional: weight loss, rigidity.  Cardiovascular: neg Ear/Nose/Throat: Drooling Skin: neg Eyes: neg Respiratory: neg Gastroitestinal: Urinary frequency Hematology/Lymphatic: neg  Endocrine: neg Musculoskeletal: Wheelchair-bound Allergy/Immunology: neg Neurological: Speech difficulty Psychiatric: Occasional hallucinations Sleep : neg   ALLERGIES: Allergies  Allergen Reactions  . Morphine  Other (See Comments)    unknown  . Nitroglycerin Other (See Comments)    unknown  . Risperidone And Related Other (See Comments)    unknown  . Tuberculin Tests Other (See Comments)    unknown  . Vancomycin Rash    HOME MEDICATIONS: Outpatient Prescriptions Prior to Visit  Medication Sig Dispense Refill  . acetaminophen (TYLENOL) 325 MG tablet Take 325 mg by mouth every 6 (six) hours as needed.     Marland Kitchen albuterol (PROVENTIL) (2.5 MG/3ML) 0.083% nebulizer solution Take 3 mLs by nebulization every 6 (six) hours as needed for wheezing or shortness of breath.    Marland Kitchen amantadine (SYMMETREL) 100 MG capsule Take one tab in AM and one tab  at lunch time. 60 capsule 5  . aspirin 81 MG tablet Take 81 mg by mouth daily.    Marland Kitchen buPROPion (WELLBUTRIN) 100 MG tablet Take 100 mg by mouth daily.    . carbidopa-levodopa (SINEMET) 25-250 MG per tablet Take 1 tablet by mouth 5 (five) times daily. The first dose in a.m. with orange juice,  continue giving medication every 4-5 hours after initial morning doors. Suggested are 8 AM, 12 noon, 4 PM, 8 PM and a late nighttime dose. 150 tablet 5  . HYDROcodone-acetaminophen (NORCO/VICODIN) 5-325 MG per tablet Take one tablet by mouth three times daily for pain. Do not exceed 4gm of Tylenol in 24 hours 90 tablet 0  . lactulose, encephalopathy, (CHRONULAC) 10 GM/15ML SOLN Take 20 g by mouth at bedtime.    Marland Kitchen latanoprost (XALATAN) 0.005 % ophthalmic solution Place 1 drop into the left eye 2 (two) times daily.    Marland Kitchen loratadine (CLARITIN) 10 MG tablet Take 10 mg by mouth daily.    Marland Kitchen LORazepam (ATIVAN) 0.5 MG tablet Take 1/2 tablet by mouth twice daily for anxiety 30 tablet 5  . midodrine (PROAMATINE) 5 MG tablet Take 5 mg by mouth 2 (two) times daily as needed.     Marland Kitchen omeprazole (PRILOSEC) 20 MG capsule Take 1 capsule by mouth daily.    Marland Kitchen PARoxetine (PAXIL) 30 MG tablet Take 30 mg by mouth daily.    . polyethylene glycol (MIRALAX / GLYCOLAX) packet Take 17 g by mouth 2 (two) times  daily. In 6- 8 ounces of liquid twice daily    . pramipexole (MIRAPEX) 0.5 MG tablet Take 1 tablet (0.5 mg total) by mouth 3 (three) times daily. 90 tablet 5  . pyridOXINE (B-6) 50 MG tablet Take 50 mg by mouth daily.    Orlie Dakin Sodium (SENOKOT S PO) Take by mouth. Take 2 tablets by mouth twice daily for constipation    . zolpidem (AMBIEN) 5 MG tablet Take one tablet by mouth every night at bedtime for rest 30 tablet 5   No facility-administered medications prior to visit.    PAST MEDICAL HISTORY: Past Medical History  Diagnosis Date  . HYPERLIPIDEMIA-MIXED 02/10/2008  . PARKINSON'S DISEASE 02/10/2008  . HYPERTENSION, BENIGN 02/09/2009  . GASTROINTESTINAL HEMORRHAGE, HX OF 02/10/2008  . ANEMIA 04/29/2010  . FIBRILLATION, ATRIAL 04/29/2010  . VENTRAL HERNIA  04/29/2010  . DEGENERATIVE JOINT DISEASE 04/29/2010  . Gastric ulcer   . Positive PPD   . Internal hemorrhoids   . Hyperplastic colon polyp 2004  . CAD (coronary artery disease)     23.0 x 24 mm Taxus DES mid LAD 2005, [place] diagonal branch 2005  . VENOUS INSUFFICIENCY 02/10/2008    rt  . Shortness of breath   . Osteomyelitis   . Calculus of kidney   . Tuberculosis     81 yrs old  . GERD (gastroesophageal reflux disease)     ulcers hx  . Infection and inflammatory reaction due to device, implant, and graft   . Neuromuscular disorder   . Sundowning     past lumbar decompressive surgery  . Chronic kidney disease (CKD), stage III (moderate)   . Blood transfusion without reported diagnosis   . Dyslipidemia   . Parkinson disease   . GI bleed   . Coronary artery disease   . PPD positive     internal hemorrhoids  . Depression   . Osteoporosis     PAST SURGICAL HISTORY: Past Surgical History  Procedure Laterality Date  . Replacement total knee  2002    right  . Nose surgery    . Ankle surgery      left  . Toe amputation      right foot  . Cataract extraction    . Carpal tunnel release      left, open rt  .  Tonsillectomy    . Appendectomy    . Coronary angioplasty with stent placement    . Back surgery  2006     back x3  . Deep brain stimulator placement  02    batteries changed 07  . Subthalamic stimulator battery replacement  02/27/2011    Procedure: SUBTHALAMIC STIMULATOR BATTERY REPLACEMENT;  Surgeon: Peggyann Shoals, MD;  Location: [facility];  Service: Neurosurgery;  Laterality: Left;  DBS battery replacment x 2 - Left Chest and Abdomen  . Eye muscle surgery      bilat  . Subthalamic stimulator removal  05/22/2011    Procedure: SUBTHALAMIC STIMULATOR REMOVAL;  Surgeon: Erline Levine, MD;  Location: [facility];  Service: Neurosurgery;  Laterality: N/A;  removal of IPG  . Subthalamic stimulator removal  07/24/2011    Procedure: SUBTHALAMIC STIMULATOR REMOVAL;  Surgeon: Erline Levine, MD;  Location: [facility];  Service: Neurosurgery;  Laterality: N/A;  Deep Brain stimulator hardware removal  . Foot amputation through metatarsal      right  . Spine surgery      FAMILY HISTORY: Family History  Problem Relation Age of Onset  . Parkinsonism Sister   . Diabetes Brother   . Stroke Brother   . Crohn's disease Daughter   . Heart disease Paternal Grandfather   . Colon cancer Neg Hx     SOCIAL HISTORY: History   Social History  . Marital Status: Married    Spouse Name: [name]  . Number of Children: 4  . Years of Education: PHD   Occupational History  . retired Education officer, museum professor    Social History Main Topics  . Smoking status: Never Smoker   . Smokeless tobacco: Never Used  . Alcohol Use: No  . Drug Use: No  . Sexual Activity: No   Other Topics Concern  . Not on file   Social History Narrative   Patient is married [name]) and he lives at [facility].   Patient has three living  children and one son is deceased.   Patient is retired.   Patient has a PhD.   Patient is right-handed.   Patient drinks coffee very occasionally.     PHYSICAL EXAM  Filed Vitals:    08/08/14 1036  BP: 102/60  Pulse: 60  Resp: 18   There is no weight on file to calculate BMI. Gen: NAD, conversant Eyes: anicteric sclerae, moist conjunctivae HENT: Atraumatic, oropharynx clear Neck:  supple,  Lungs: CTA, no wheezing, rales, rhonic  CV: RRR, no MRG Extremities: No peripheral edema  Skin: Normal temperature, no rash,  Psych: Appropriate affect, pleasant  Neuro: MS: Alert and appropriate  Cranial nerves: There is good facial symmetry. There is facial hypomima. Pupils are equal round and reactive to light bilaterally. ptosis in the left.  The speech is fluent, hypophonic and mildly dysarthric. Soft palate has limited Rise - there is no tongue deviation. tremor of the tongue.  Hearing is intact to conversational tone.  Sensation: Sensation is intact to light and pinprick throughout (facial, trunk, extremities). Vibration is markedly decreased distally.     Motor:  Patient is able to provide for short-term good grip strength on the left and right, but he cannot maintain it. He has high degree of rigidity in biceps, triceps, his wrists. His cyanotic fingers are evident.  This is likely a side effect of amantadine. Noted left laterocolis and right torticolis, tenderness to palpation in bilateral trapezius and left levator scapulae. The patient has a limited ROM in the left shoulder, has pain with passive testing of muscle tone.  Deep tendon reflexes: Deep tendon reflexes are 0/4 at the bilateral biceps, triceps, brachioradialis, patella and achilles. Plantar responses are downgoing bilaterally. ( see spinal stenosis comment )   Movement examination:  There is a moderate right upper extremity resting tremor. There is an intermittent left upper extremity resting tremor. There is a chin tremor.  Some dyskinesias of extremities > trunk,  Marked bradykinesia with finger taps, hand opening/closing, strong grip.  bluish discolored fingers.    Gait and Station: The patient remains unable to arising out of a chair even with maximal assistance.   DIAGNOSTIC DATA (LABS, IMAGING, TESTING) - I reviewed patient records, labs, notes, testing and imaging myself where available.  [place] needs order for CMET, CBC , UA      ASSESSMENT AND PLAN  81 y.o. year old male  has a past medical history of advanced Parkinson's disease which is minimally responsive to Sinemet. He remains on amantadine and Mirapex.  The patient presented with excessive daytime sleepiness reflected and an Epworth sleepiness score of 18 points. There was a report of a dry mouth in the morning witnessed lo ud snoring and witnessed apneas by his spouse. The patient's regular sleep routine is as follows; the patient normally goes to bed around 11 and falls asleep within 10-15 minutes he is awoken 3 times at night and nocturia and is woken by his alarm clock at 6 in the morning. 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 Dopaminergic are good witnessed as well as the levo on carbidopa 5 times daily. His day is paced between nutritional intake and medication. The environment he is currently residing and does not allow him to take his medication at a certain time a day. I will therefore request that he will be reevaluated for dual dopa. Enteral suspension of carbidopa-levodopa.  Continue current meds, order DuoDopa evaluation with the company.  Follow-up 4-6 months,  next with  Dennie Bible, [facility], [facility], APRN  [facility] [address] [place], [place] [address] [phone]

## 2014-08-08 NOTE — Patient Instructions (Signed)
Carbidopa; Levodopa enteral suspension What is this medicine? CARBIDOPA;LEVODOPA (kar bi DOE pa; lee voe DOE pa) enteral suspension is used to treat certain symptoms of advanced Parkinson's disease. This medicine may be used for other purposes; ask your health care provider or pharmacist if you have questions. COMMON BRAND NAME(S): Duopa What should I tell my health care provider before I take this medicine? They need to know if you have any of these conditions: -diabetes -drink alcohol -feel sleepy or have fallen asleep suddenly during the day -glaucoma -heart disease -high blood pressure -history of irregular heartbeat -kidney disease -liver disease -low blood pressure -lung or breathing disease, like asthma -melanoma, skin cancer, or suspicious skin lesions -mental illness -nerve problems -problems with fainting -stomach or intestine problems or surgery -an unusual or allergic reaction to levodopa, carbidopa, other medicines, foods, dyes, or preservatives -pregnant or trying to get pregnant -breast-feeding How should I use this medicine? Your prescribed dose will be programmed into your pump by a healthcare provider. Your healthcare provider will show you how to use this medication cassette with the pump before you use it for the first time. See the "Instructions for Use" for complete instructions. Your dose will only be changed by your healthcare provider or while you are with your healthcare provider. Do not stop using this medicine or change your dose without the approval of your healthcare provider. This medicine should be used at room temperature. Take one medication cassette out of the refrigerator and out of the carton 20 minutes prior to use. Use the product at room temperature or you may not get the right dose. The medicine is given continuously over 16 hours. A small pump (CADD-Legacy 1400) is used to move the medicine from the medication cassette into your PEG-J tube. Each  cassette can only be used one time. An opened cassette should not be reused. The cassette should not be used for longer than 16 hours. Disconnect the pump from your PEG-J tube after the 16 hour dosing time is finished. Use a syringe filled with room temperature water to flush your PEG-J tube. After your daily infusion, you should take your usual night time dose of oral carbidopa-levodopa tablets as prescribed. Keep a supply of oral regular-release carbidopa-levodopa tablets with you in case you are unable to receive your infusion. This medication is not used in children. Overdosage: If you think you've taken too much of this medicine contact a poison control center or emergency room at once. Overdosage: If you think you have taken too much of this medicine contact a poison control center or emergency room at once. NOTE: This medicine is only for you. Do not share this medicine with others. What if I miss a dose? If you stop your infusion for more than 2 hours during your 16 hour dosing time for any reason, call your healthcare provider and take oral carbidopa-levodopa tablets as prescribed until you are able to restart your infusion. If you stop your infusion for less than 2 hours, you do not need to take the carbidopa-levodopa tablets, but your healthcare provider may tell you to take an extra dose of your infusion. Keep a supply of oral immediate-release carbidopa-levodopa tablets with you in case you are unable to receive your infusion. What may interact with this medicine? Do not take this medicine with any of the following medications: -MAOIs like Marplan, Nardil, and Parnate -reserpine -tetrabenazine This medicine may also interact with the following medications: -alcohol -droperidol -entacapone -iron supplements or multivitamins with  iron -isoniazid, INH -linezolid -medicines for depression, anxiety, or psychotic disturbances -medicines for high blood pressure -medicines for  sleep -metoclopramide -papaverine -procarbazine -tedizolid -rasagiline -selegiline -tolcapone This list may not describe all possible interactions. Give your health care provider a list of all the medicines, herbs, non-prescription drugs, or dietary supplements you use. Also tell them if you smoke, drink alcohol, or use illegal drugs. Some items may interact with your medicine. What should I watch for while using this medicine? Tell your doctor or healthcare professional if your symptoms do not start to get better or if they get worse. Do not stop taking except on your doctor's advice. You may develop a severe reaction. Your doctor will tell you how much medicine is needed. Talk to your healthcare provider about what you need to do to care for your PEG-J tube site. Watch for signs of infection. Complications in the stomach or intestines can occur from the PEG-J tube procedure. Contact your healthcare provider immediately if you experience stomach pain, constipation that does not go away, nausea, vomiting, fever, or black stools. If your PEG-J tube becomes kinked, knotted, or blocked this may cause your Parkinson's symptoms to worsen or cause recurring movement problems (motor fluctuations). Call your healthcare provider if your Parkinson's symptoms get worse or you have slow movement while you are treated with this medicine. Eating high protein foods may affect how this medicine works. Tell your healthcare provider if you change your diet. You may get drowsy or dizzy. Do not drive, use machinery, or do anything that needs mental alertness until you know how this medicine affects you. Do not stand or sit up quickly, especially if you are an older patient. This reduces the risk of dizzy or fainting spells. Alcohol may interfere with the effect of this medicine. Avoid alcoholic drinks. If you find that you have sudden feelings of wanting to sleep during normal activities, like cooking, watching  television, or while driving or riding in a car, you should contact your health care professional. Sleepiness and falling asleep suddenly can happen as late as 1 year after you start your treatment. There have been reports of increased sexual urges or other strong urges such as gambling while taking this medicine. If you experience any of these while taking this medicine, you should report this to your health care provider as soon as possible. Patients and their families should watch out for new or worsening depression or thoughts of suicide. Call your healthcare provider right away if you feel depressed or have thoughts of suicide. You may experience a 'wearing off' effect prior to the time for your next dose of this medicine. You may also experience an 'on-off' effect where the medicine apparently stops working for any time from a minute to several hours, then suddenly starts working again. Tell your doctor or health care professional if any of these symptoms happen to you. Your dose may need adjustment. You should often check your skin for changes to moles and new growths while taking this medicine. Call your doctor if you notice any of these changes. If you have diabetes, you may get a false-positive result for sugar in your urine. Check with your doctor or health care professional. This medicine may discolor the urine or sweat, making it look darker or red in color. This is of no cause for concern. However, this may stain clothing or fabrics. What side effects may I notice from receiving this medicine? Side effects that you should report to your doctor  or health care professional as soon as possible: -allergic reactions like skin rash, itching or hives, swelling of the face, lips, or tongue -anxiety, confusion, or nervousness -blood in your stool or a dark tarry stool -chest pain -constipation that does not go away -falling asleep during normal activities like driving -fast, irregular  heartbeat -hallucination, loss of contact with reality -new or increased gambling urges, sexual urges, uncontrolled spending, binge or compulsive eating, or other urges -severe nausea and vomiting -signs and symptoms of infection like fever or chills; cough; sore throat; pain or trouble passing urine -signs and symptoms of low blood pressure like dizziness; feeling faint or lightheaded, falls; unusually weak or tired -stomach pain -suicidal thoughts or other mood changes -uncontrolled movements of the mouth, head, hands, feet, shoulders, eyelids or other unusual muscle movements -weakness or numbness or loss of sensation in the fingers or feet Side effects that usually do not require medical attention (Report these to your doctor or health care professional if they continue or are bothersome.): -headache -loss of appetite -muscle twitches -swelling of legs and feet -trouble sleeping This list may not describe all possible side effects. Call your doctor for medical advice about side effects. You may report side effects to FDA at 1-800-FDA-1088. Where should I keep my medicine? Keep out of the reach of children. Store unopened cassettes in the refrigerator between 2 degrees C and 8 degrees C (36 degrees F to 46 degrees F). Do not freeze. Protect the cassette from light and keep it in the carton before using. Use before the expiration date printed on the cassette. Throw away any expired medicine. Once a carton is removed from the refrigerator, opened, or in use, discard after 16 hours, even if it contains medication. Use before the expiration date printed on the cassette. NOTE: This sheet is a summary. It may not cover all possible information. If you have questions about this medicine, talk to your doctor, pharmacist, or health care provider.  2015, [employer] Standard. (2013-04-22 10:11:06)

## 2014-08-08 NOTE — Progress Notes (Signed)
Patient ID: [name], male   DOB: 03/20/1929, 81 y.o.   MRN: [id number]   08/08/2014  Facility:  Nursing Home Location:  [place] Room Number: 532-9 LEVEL OF CARE:  SNF (31)   Chief Complaint  Patient presents with  . Medical Management of Chronic Issues    CAD, Parkinson's disease, PAF, depression, allergic rhinitis, anxiety, insomnia, history of GI bleed, constipation and glaucoma    HISTORY OF PRESENT ILLNESS:  This is an 81 year old male was being seen on a routine visit. He is a long-term care resident at [facility]. He has medical history of atrial fibrillation but not on anticoagulant due to history of GI bleed, Parkinson's disease, orthostatic hypotension and depression. His heart rate has been well controlled and not on any anticoagulation for his PAF due to hx of GI bleed. He gets agitated @ times and was recently started on Depakote 125 mg PO Q HS and continues to take Wellbutrin and Paxil for his Depression. He takes Mirapex and Amantadine for his Parkinson's disease.  PAST MEDICAL HISTORY:  Past Medical History  Diagnosis Date  . HYPERLIPIDEMIA-MIXED 02/10/2008  . PARKINSON'S DISEASE 02/10/2008  . HYPERTENSION, BENIGN 02/09/2009  . GASTROINTESTINAL HEMORRHAGE, HX OF 02/10/2008  . ANEMIA 04/29/2010  . FIBRILLATION, ATRIAL 04/29/2010  . VENTRAL HERNIA 04/29/2010  . DEGENERATIVE JOINT DISEASE 04/29/2010  . Gastric ulcer   . Positive PPD   . Internal hemorrhoids   . Hyperplastic colon polyp 2004  . CAD (coronary artery disease)     23.0 x 24 mm Taxus DES mid LAD 2005, [place] diagonal branch 2005  . VENOUS INSUFFICIENCY 02/10/2008    rt  . Shortness of breath   . Osteomyelitis   . Calculus of kidney   . Tuberculosis     81 yrs old  . GERD (gastroesophageal reflux disease)     ulcers hx  . Infection and inflammatory reaction due to device, implant, and graft   . Neuromuscular disorder   . Sundowning     past lumbar decompressive surgery    . Chronic kidney disease (CKD), stage III (moderate)   . Blood transfusion without reported diagnosis   . Dyslipidemia   . Parkinson disease   . GI bleed   . Coronary artery disease   . PPD positive     internal hemorrhoids  . Depression   . Osteoporosis     CURRENT MEDICATIONS: Reviewed per MAR/see medication list  Allergies  Allergen Reactions  . Morphine Other (See Comments)    unknown  . Nitroglycerin Other (See Comments)    unknown  . Risperidone And Related Other (See Comments)    unknown  . Tuberculin Tests Other (See Comments)    unknown  . Vancomycin Rash     REVIEW OF SYSTEMS:  Unable to obtain due to patient being a poor historian  PHYSICAL EXAMINATION  GENERAL: no acute distress NECK: supple, trachea midline, no neck masses, no thyroid tenderness, no thyromegaly LYMPHATICS: no LAN in the neck, no supraclavicular LAN RESPIRATORY: breathing is even & unlabored, BS CTAB CARDIAC: RRR, no murmur,no extra heart sounds, no edema GI: abdomen soft, normal BS, no masses, no tenderness, no hepatomegaly, no splenomegaly EXTREMITIES:   Well to move 4 extremities extremities NEURO: + tremors and rigidity; ambulates with wheelchair PSYCHIATRIC: the patient is alert & oriented to person, affect & behavior appropriate  LABS/RADIOLOGY: Labs reviewed:   07/10/14  WBC 6.8 hemoglobin 12.1 hematocrit 37.7 MCV 94.0 sodium  139 potassium 4.3 glucose 73 BUN 20 creatinine 1.46 total bilirubin 0.4 alkaline phosphatase 101 SGOT 22 SGPT <8 total protein 7.3 out in 3.8 calcium 9.0 03/23/14  chest x-ray shows negative pneumonia 03/02/14  WBC 8.3 hemoglobin 11.8 hematocrit 37.6 MCV and 98.7 sodium 138 potassium 4.2 glucose 91 BUN 26 creatinine 1.4 calcium 8.7 GFR 50.68 12/17/14  total protein 7.4 albumin 4.0 ALP 91 AST 20 ALT 7 12/09/13  WBC 8.9 hemoglobin 12.1 hematocrit 39.4 MCV 97.8 sodium 139 potassium 4.4 glucose 66 BUN 23 creatinine 1.4 calcium 9.4 GFR 53.30 total protein 7.5  albumin 4.0  ALP 96 AST 20 ALT 8 4-15 liver profile normal, CMP normal, hemoglobin 12.3, MCV 96.6 otherwise CBC normal 6/14 hemoglobin 12.8, MCV 91.3 otherwise CBC normal, glucose 102, BUN 24, creatinine 1.3 otherwise CMP normal 3/14 Hb 12.6, mcv 87.9 ow cbc nl 12/13 bun 25, cr 1.42 ow cmp nl  ASSESSMENT/PLAN:  CAD - stable; continue aspirin 81 mg by mouth daily Parkinson's disease - continue Amantadine 100 mg 1 capsule PO BID, Mirapex 0.5 mg by mouth 3 times a day and Sinemet 25-250 mg 1 tab by mouth 5X/day; currently followed by Hospice - comfort care Atrial fibrillation - rate controlled; not on any anticoagulant due to history of GI bleed; continue aspirin 81 mg by mouth daily Depression - mood is stable; continue Paxil 30 mg by mouth daily,  Wellbutrin 100 mg by mouth daily and Depakote 125 mg PO Q HS Allergic rhinitis - stable; continue Claritin 10 mg by mouth daily Constipation - continue senna S 2 tabs by mouth twice a day, MiraLAX 17 g +4-6 ounces liquid by mouth twice a day and lactulose 20 g/30 mL by mouth daily at bedtime Anxiety - mood is stable; continue Ativan 0.5 mg 1/2 tab = 0.25 mg by mouth twice a day and daily when necessary Insomnia - continue Ambien 5 mg 1 tab by mouth daily at bedtime History of GI bleed - continue Prilosec 20 mg by mouth daily Orthostatic hypotension - stable; continue midodrine 5 mg by mouth twice a day when necessary Glaucoma of left eye - continue Xalatan 0.005% instill 1 gtt to left eye Q HS   Goals of care:   long-term care/comfort care/hospice    [facility], NP [place]

## 2014-09-28 ENCOUNTER — Ambulatory Visit: Payer: Medicare Other | Admitting: Nurse Practitioner

## 2014-11-08 ENCOUNTER — Encounter: Payer: Self-pay | Admitting: Neurology

## 2014-11-08 ENCOUNTER — Ambulatory Visit (INDEPENDENT_AMBULATORY_CARE_PROVIDER_SITE_OTHER): Admitting: Neurology

## 2014-11-08 VITALS — BP 126/82 | HR 88 | Resp 20

## 2014-11-08 DIAGNOSIS — R490 Dysphonia: Secondary | ICD-10-CM | POA: Diagnosis not present

## 2014-11-08 DIAGNOSIS — R251 Tremor, unspecified: Secondary | ICD-10-CM

## 2014-11-08 HISTORY — DX: Dysphonia: R25.1

## 2014-11-08 MED ORDER — PIMAVANSERIN TARTRATE 17 MG PO TABS: 17.0000 mg | ORAL_TABLET | ORAL | Status: AC

## 2014-11-08 NOTE — Progress Notes (Signed)
[facility]  PATIENT: [name] DOB: 06-Aug-1929   REASON FOR VISIT: Follow-up for progressive Parkinson's disease  HISTORY FROM: Wife and patient  HISTORY: Originally diagnosed with PD in 1987, had DBS placed in 2002, unfortunately caught a mycotic infection with battery change in 2012 and device had to be removed. Main concerns recently have been increased drooling and orthostatic hypotension. At last visit with Dr Brett Fairy the Sinemet 25-250 was increased to 5x a day at 6am, noon, 4pm, 8pm and midnight. They are unsure if he is getting the medication at these set times. He is also taking Mirapex 0.5mg  5x a day. He is currently taking the amantadine 100mg  twice a day. He is also currently taking midodrine 5mg  twice a day.  He is currently living in at [facility] in a double bedroom, nursing home and is wheelchair bound.  His main concerns today are lack of exercise and rehab. He expresses a strong desire to ambulate more. His wife notes he has not been home or ambulating freely since March 6712 due to complications from the DBS infection. He is unsure if they notice any benefit from the Sinemet. They do notice more dyskinesias, especially since increasing the Sinemet to 5x a day. He does note frequent wearing off and freezing episodes.    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  11-08-14, Mr. Thornton Papas is now followed by Dr. Karie Soda through the hospice of [place]. Some medication changes have taken place in the meanwhile. His facility is also taking orthostatic blood pressures and heart rate. The patient has a decubitus and is still on the wound healing protocol. Depakote was discontinued as of 09-26-14 and the patient started on several quell 12.5 g at night to help him rest at night. This has also helped his agitation. The patient remains on mechanically soft diet. Sinemet 225/250 mg has been given  at 6 AM, 10 AM, 2 PM and 6 PM according to the nursing notes. Pramipexole 0.5 mg to be given at 9.00, 13.00 hrs. and 17.00 hrs. He would no longer require Ambien at night if Seroquel is taken. The patient feels bothered by nightmarish dreams and sometimes has some visual hallucinations. There have never been acoustic hallucinations. The seroquel use may have increased his hoarseness, he feels. He requires some assistance with meals but he would prefer to repeat himself. He does not have dysphagia, but dysphonia. 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     08-08-14, CD He is living at [facility] and on Hospice support- nobody felt competent enough to refill and adjust the pump for Duopa. He is holding off at this time.  Continues with intermittent visual hallucinations. Sees animal and people that are not there, this occurs randomly throughout the day and is not worse at night.  This is not frightening to him He is currently taking SInemet 25-250 about  5x a day,his environment does not provide for regular intake times.  Mirapex  0.5mg , 5x a day and amantadine 100mg  BID. He has lost weight. He reports that his appetite is good but he cannot eat as fast as his food cools he has lost hand grip and has high amplitude tremor, needs help with feeding.  He can only ambulate 2-3 steps, but had no recent opportunity to walk, there is no regular attempt to mobilize him,  as he needs 2 persons for assistance.  He has no longer PT , as he is on hospice?  anything he could still do 18 month ago , he now needs  assistance. He is seated in a wheelchair today. He is followed by Joylene Draft, MD .  I would still consider him a duo-dopa candidate, but with his lack of dexterity he would still require an assistance.Dr. Darcella Cheshire has developed cyanotic fingers,  often cold hands and feet, on and off time of medication has been difficult to determine because  his meal and medication intake times very so much. He does usually have rather troublesome freezing episodes before the next all doses given. High rigidity he had more off time and he had visual hallucinations. Also [place] he feels hyperkinetic after taking medication with in about 60 minutes or less of intake time. He has significant ambulatory difficulties not just from Parkinson's disease but from a spinal stenosis as a core morbidity in his case. He does have some memory episodes now that were not initially present.     REVIEW OF SYSTEMS: Full 14 system review of systems performed and notable only for those listed, all others are neg:  Constitutional: weight loss, rigidity.   Ear/Nose/Throat: Drooling Skin: pressure ulcers, has a lesion on the right ankle.  Gastroitestinal: Urinary frequency Musculoskeletal: Wheelchair-bound Neurological: Speech difficulty Psychiatric: Occasional hallucinations Sleep : hallucinations at night, REM BD    ALLERGIES: Allergies  Allergen Reactions  . Morphine Other (See Comments)    unknown  . Nitroglycerin Other (See Comments)    unknown  . Risperidone And Related Other (See Comments)    unknown  . Tuberculin Tests Other (See Comments)    unknown  . Vancomycin Rash    HOME MEDICATIONS: Outpatient Prescriptions Prior to Visit  Medication Sig Dispense Refill  . acetaminophen (TYLENOL) 325 MG tablet Take 325 mg by mouth every 6 (six) hours as needed.     Marland Kitchen albuterol (PROVENTIL) (2.5 MG/3ML) 0.083% nebulizer solution Take 3 mLs by nebulization every 6 (six) hours as needed for wheezing or shortness of breath.    Marland Kitchen amantadine (SYMMETREL) 100 MG capsule Take one tab in AM and one tab  at lunch time. 60 capsule 5  . aspirin 81 MG tablet Take 81 mg by mouth daily.    Marland Kitchen buPROPion (WELLBUTRIN) 100 MG tablet Take 100 mg by mouth daily.    . carbidopa-levodopa (SINEMET) 25-250 MG per tablet Take 1 tablet by mouth 5 (five) times daily. The first dose in  a.m. with orange juice,  continue giving medication every 4-5 hours after initial morning doors. Suggested are 8 AM, 12 noon, 4 PM, 8 PM and a late nighttime dose. 150 tablet 5  . HYDROcodone-acetaminophen (NORCO/VICODIN) 5-325 MG per tablet Take one tablet by mouth three times daily for pain. Do not exceed 4gm of Tylenol in 24 hours 90 tablet 0  . lactulose, encephalopathy, (CHRONULAC) 10 GM/15ML SOLN Take 20 g by mouth at bedtime.    Marland Kitchen latanoprost (XALATAN) 0.005 % ophthalmic solution Place 1 drop into the left eye 2 (two) times daily.    Marland Kitchen loratadine (CLARITIN) 10 MG tablet Take 10 mg by mouth daily.    Marland Kitchen LORazepam (ATIVAN) 0.5 MG tablet Take 1/2 tablet by mouth twice daily for anxiety 30 tablet 5  . midodrine (PROAMATINE) 5 MG tablet Take 5 mg by mouth 2 (two) times daily as needed.     Marland Kitchen omeprazole (PRILOSEC) 20 MG capsule Take 1  capsule by mouth daily.    Marland Kitchen PARoxetine (PAXIL) 30 MG tablet Take 30 mg by mouth daily.    . polyethylene glycol (MIRALAX / GLYCOLAX) packet Take 17 g by mouth 2 (two) times daily. In 6- 8 ounces of liquid twice daily    . pramipexole (MIRAPEX) 0.5 MG tablet Take 1 tablet (0.5 mg total) by mouth 3 (three) times daily. 90 tablet 5  . pyridOXINE (B-6) 50 MG tablet Take 50 mg by mouth daily.    Orlie Dakin Sodium (SENOKOT S PO) Take by mouth. Take 2 tablets by mouth twice daily for constipation    . zolpidem (AMBIEN) 5 MG tablet Take one tablet by mouth every night at bedtime for rest 30 tablet 5   No facility-administered medications prior to visit.    PAST MEDICAL HISTORY: Past Medical History  Diagnosis Date  . HYPERLIPIDEMIA-MIXED 02/10/2008  . PARKINSON'S DISEASE 02/10/2008  . HYPERTENSION, BENIGN 02/09/2009  . GASTROINTESTINAL HEMORRHAGE, HX OF 02/10/2008  . ANEMIA 04/29/2010  . FIBRILLATION, ATRIAL 04/29/2010  . VENTRAL HERNIA 04/29/2010  . DEGENERATIVE JOINT DISEASE 04/29/2010  . Gastric ulcer   . Positive PPD   . Internal hemorrhoids   .  Hyperplastic colon polyp 2004  . CAD (coronary artery disease)     23.0 x 24 mm Taxus DES mid LAD 2005, [place] diagonal branch 2005  . VENOUS INSUFFICIENCY 02/10/2008    rt  . Shortness of breath   . Osteomyelitis   . Calculus of kidney   . Tuberculosis     81 yrs old  . GERD (gastroesophageal reflux disease)     ulcers hx  . Infection and inflammatory reaction due to device, implant, and graft   . Neuromuscular disorder   . Sundowning     past lumbar decompressive surgery  . Chronic kidney disease (CKD), stage III (moderate)   . Blood transfusion without reported diagnosis   . Dyslipidemia   . Parkinson disease   . GI bleed   . Coronary artery disease   . PPD positive     internal hemorrhoids  . Depression   . Osteoporosis     PAST SURGICAL HISTORY: Past Surgical History  Procedure Laterality Date  . Replacement total knee  2002    right  . Nose surgery    . Ankle surgery      left  . Toe amputation      right foot  . Cataract extraction    . Carpal tunnel release      left, open rt  . Tonsillectomy    . Appendectomy    . Coronary angioplasty with stent placement    . Back surgery  2006     back x3  . Deep brain stimulator placement  02    batteries changed 07  . Subthalamic stimulator battery replacement  02/27/2011    Procedure: SUBTHALAMIC STIMULATOR BATTERY REPLACEMENT;  Surgeon: Peggyann Shoals, MD;  Location: [place] [facility];  Service: Neurosurgery;  Laterality: Left;  DBS battery replacment x 2 - Left Chest and Abdomen  . Eye muscle surgery      bilat  . Subthalamic stimulator removal  05/22/2011    Procedure: SUBTHALAMIC STIMULATOR REMOVAL;  Surgeon: Erline Levine, MD;  Location: [place] [facility];  Service: Neurosurgery;  Laterality: N/A;  removal of IPG  . Subthalamic stimulator removal  07/24/2011    Procedure: SUBTHALAMIC STIMULATOR REMOVAL;  Surgeon: Erline Levine, MD;  Location: [place] [facility];  Service: Neurosurgery;  Laterality: N/A;  Deep Brain stimulator hardware  removal  . Foot amputation through metatarsal      right  . Spine surgery      FAMILY HISTORY: Family History  Problem Relation Age of Onset  . Parkinsonism Sister   . Diabetes Brother   . Stroke Brother   . Crohn's disease Daughter   . Heart disease Paternal Grandfather   . Colon cancer Neg Hx     SOCIAL HISTORY: Social History   Social History  . Marital Status: Married    Spouse Name: [name]  . Number of Children: 4  . Years of Education: PHD   Occupational History  . retired Education officer, museum professor    Social History Main Topics  . Smoking status: Never Smoker   . Smokeless tobacco: Never Used  . Alcohol Use: No  . Drug Use: No  . Sexual Activity: No   Other Topics Concern  . Not on file   Social History Narrative   Patient is married [name]) and he lives at [facility]   Patient has three living children and one son is deceased.   Patient is retired.   Patient has a PhD.   Patient is right-handed.   Patient drinks coffee very occasionally.     PHYSICAL EXAM  Filed Vitals:   11/08/14 1450  BP: 126/82  Pulse: 88  Resp: 20   There is no weight on file to calculate BMI. Gen: NAD, conversant Eyes: anicteric sclerae, moist conjunctivae HENT: Atraumatic, oropharynx clear Neck:  supple,  15.25 inch neck- appears to have lost continuously more weight.  Lungs: CTA, no wheezing, rales, rhonic  CV: RRR, no MRG Extremities: No peripheral edema - skin lesion on the right lateral ankle- delayed wound healing.  Skin: Normal temperature, no rash,  Psych: Appropriate affect, pleasant  Neuro: MS: Alert and appropriate  Cranial nerves: There is good facial symmetry. His jaw remains dropped. He drools.  There is facial hypomima. Pupils are equal round and reactive to light bilaterally. ptosis in the left.  The speech is fluent, hypophonic and  dysarthric.  Soft palate has limited rise - there is no tongue deviation. tremor of the  tongue.  Hearing is intact to conversational tone.  Sensation: Sensation is intact to light and pinprick throughout (facial, trunk, extremities). Vibration is markedly decreased distally.     Motor:  Patient is able to provide for short-term good grip strength on the left and right, but he cannot maintain it. He has high degree of rigidity in biceps, triceps, his wrists. His cyanotic fingers are evident.  This is likely a side effect of amantadine. Noted left laterocolis and right torticolis, tenderness to palpation in bilateral trapezius and left levator scapulae. The patient has a limited ROM in the left shoulder, has pain with passive testing of muscle tone.  Deep tendon reflexes: Deep tendon reflexes are 0/4 at the bilateral biceps, triceps, brachioradialis, patella and achilles. Plantar responses are downgoing bilaterally. ( see spinal stenosis comment )   Movement examination:  There is a moderate right upper extremity resting tremor. There is an intermittent left upper extremity resting tremor. There is a chin tremor.  Some dyskinesias of extremities > trunk,  Marked bradykinesia with finger taps, hand opening/closing, strong grip.  bluish discolored fingers.  Gait and Station: The patient remainsunable to arise out of a chair even with maximal assistance.     He is not seen ambulating in our visit of 45 minute . This visit is prolonged due to  the slowed motor response and assistance needed.    DIAGNOSTIC DATA (LABS, IMAGING, TESTING) - I reviewed patient records, labs, notes, testing and imaging myself where available.  NUPLAZID discussed - research paper attached.  Labs drawn at [facility]  45 minute prolonged rv , more than 50% of our face-to-face time is dedicated to the medication management of advanced Parkinson's disease, recent changes and symptoms noted in response to these changes and coordination- operation of care with hospice of [facility] and gerontology at  [facility].  ASSESSMENT AND PLAN  81 y.o. year old male  has a past medical history of advanced Parkinson's disease which is minimally responsive to Sinemet. He remains on sinemet  and Mirapex. Dr. Darcella Cheshire voice has become very dysphonic. He also recently lost some teeth. I do wonder if the Seroquel has contributed to some of the dysphonia. However it has a use in treating hallucination and nightmares causing nocturnal agitation and anxiety. There is a new medication, NUPLAZID, which is used by gerontologist- psychiatrists and promises not to cause a worsening of parkinsonian symptoms while treating hallucinations.  Follow-up 4-6 months, next with me or Dennie Bible, [facility] always give him last appointment of the day and always 30 minutes minimum duration.   [facility] [address] [place], [place] [address] [phone]  cc to Suszanne Conners , [place]

## 2014-11-08 NOTE — Patient Instructions (Signed)
I printed information in today's visit addendum for the drug Nuplazid

## 2014-11-09 NOTE — Progress Notes (Signed)
Mailed a letter to pt's wife with the AVS from their visit yesterday and the nuplazid start form for her to sign and mail back to me.

## 2014-11-14 ENCOUNTER — Telehealth: Payer: Self-pay | Admitting: Neurology

## 2014-11-14 NOTE — Telephone Encounter (Signed)
Patients wife called requesting more information on Pimavanserin Tartrate (NUPLAZID) 17 MG TABS . She states it is very expensive and she doesn't know how they will be able to pay for it. She can be reached at [phone]

## 2014-11-20 ENCOUNTER — Telehealth: Payer: Self-pay | Admitting: Neurology

## 2014-11-20 NOTE — Telephone Encounter (Signed)
Spoke to pt's wife. She says that the nuplazid would be too much for them to afford at this time. She is wondering if the seroquel 12.5 mg at bedtime could be increased instead? She wishes this information to be relayed to hospice of [place] if possible.  I advised pt that I would ask Dr. Brett Fairy and call her back. Pt's wife verbalized understanding.

## 2014-11-20 NOTE — Telephone Encounter (Signed)
Wife would like to speak to nurse regarding QUEtiapine (SEROQUEL) 25 MG tablet. Wife states this can be increased rather than starting a whole new medicine.

## 2014-11-21 NOTE — Telephone Encounter (Signed)
I called Nuplazid.  Spoke with Blanch Media.  She said the patient would be eligible to get a 30 day trail at no cost.  During this trial period, they would conduct a benefit investigation and determine if patient is eligible for assistance through either their co-pay assist or patient assist programs.  They would reach out directly to the patient/caregiver to screen them and verify wether or not they qualify for the program.  Unfortunately, a definitive answer cannot be provided until the benefit investigation and screening are complete.

## 2014-11-21 NOTE — Telephone Encounter (Signed)
Called [name] : The benefit of the nuplazid versus  Seroquel was the new medication's compatibility in patient's with Parkinson's disease or Lewy body dementia. It does not add a secondary layer of parkinsonism. I had hoped nuplazid  would have a patient assistance program in place. Can we have Janett Billow check on this, Erasmo Downer?  If this is not the case,  I will be happy to increase the Seroquel from 12.5 to 25 mg. Only at night.  Again I had to advise Lance. [place] of the side effects such as increased rigidity, tremor, dysarthria, dysphagia . She felt the drug was cost prohibitive but was unaware of any assistance. 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  I will clarify with RN , Janett Billow and hospice to see if  NUPLAZID will be covered.

## 2014-11-21 NOTE — Telephone Encounter (Signed)
Called and spoke to wife of pt. She said that she had not yet mailed to me the nuplazid start form that needed her signature as of yet. She was still unsure, after talking to Dr. Brett Fairy, whether she wants to start the pt on the Nuplazid. However, she said she would mail the paperwork back to me signed so we could at least get the process started in cause she does decide to proceed with the medication.

## 2014-11-22 NOTE — Telephone Encounter (Signed)
Nuplazid start form received in the mail. I faxed start form to [facility], cpht, to get the process started with the nuplazid.

## 2014-11-30 NOTE — Telephone Encounter (Signed)
Nuplazid advised me that they received the start form. They are waiting on Mrs. Mossberg to decide if this is the medication that she would like the pt to be on. They are going to have their medication information dept call Mrs. Mesenbrink and discuss further.

## 2014-11-30 NOTE — Telephone Encounter (Signed)
Phone call from Lon/Nuplazid [phone], needs contact info for patient and other information.

## 2014-12-05 ENCOUNTER — Telehealth: Payer: Self-pay | Admitting: Neurology

## 2014-12-05 NOTE — Telephone Encounter (Signed)
[facility], [name] , called and wanted to know about prior auth. Has it been sent? Please call [phone]

## 2014-12-05 NOTE — Telephone Encounter (Signed)
Spoke to [place] at [employer]. She wants to know if we have completed the PA. I advised her that we have not received a PA yet for this medication for this pt. She said she faxed over another one this morning.  I called the pt's wife to discuss if she even wants to proceed with this medication. She originally had some concerns about price. No answer, left a message asking her to call me back.

## 2014-12-06 NOTE — Telephone Encounter (Signed)
Spoke to pt's wife. She says that she wants to hold off of the nuplazid for now. She says that hospice increased pt's seroquel and he is quite sleepy. Pt's wife has stated that the pt has gone downhill even further this summer, and she does not want to prolong his life. I advised her that if she changes her mind and does want to proceed with nuplazid, to let us know, and we will complete the PA. She is asking me to not complete the PA at this time.

## 2014-12-13 NOTE — Telephone Encounter (Signed)
Tonya/[facility] [phone] called advising they have faxed over PA request several times with no response. Please call [phone] or PA dept [phone].

## 2014-12-13 NOTE — Telephone Encounter (Signed)
Spoke to [employer]. Advised them that pt's wife did not want me to complete the PA for nuplazid at this time. They said they would be available if the pt decided to try the nuplazid at a later time.

## 2015-01-30 ENCOUNTER — Non-Acute Institutional Stay (SKILLED_NURSING_FACILITY): Payer: Medicare Other | Admitting: Adult Health

## 2015-01-30 DIAGNOSIS — H409 Unspecified glaucoma: Secondary | ICD-10-CM

## 2015-01-30 DIAGNOSIS — Z8719 Personal history of other diseases of the digestive system: Secondary | ICD-10-CM

## 2015-01-30 DIAGNOSIS — I951 Orthostatic hypotension: Secondary | ICD-10-CM

## 2015-01-30 DIAGNOSIS — I2510 Atherosclerotic heart disease of native coronary artery without angina pectoris: Secondary | ICD-10-CM | POA: Diagnosis not present

## 2015-01-30 DIAGNOSIS — F329 Major depressive disorder, single episode, unspecified: Secondary | ICD-10-CM

## 2015-01-30 DIAGNOSIS — R140 Abdominal distension (gaseous): Secondary | ICD-10-CM | POA: Diagnosis not present

## 2015-01-30 DIAGNOSIS — F419 Anxiety disorder, unspecified: Secondary | ICD-10-CM | POA: Diagnosis not present

## 2015-01-30 DIAGNOSIS — G20A1 Parkinson's disease without dyskinesia, without mention of fluctuations: Secondary | ICD-10-CM

## 2015-01-30 DIAGNOSIS — K5900 Constipation, unspecified: Secondary | ICD-10-CM | POA: Diagnosis not present

## 2015-01-30 DIAGNOSIS — I480 Paroxysmal atrial fibrillation: Secondary | ICD-10-CM | POA: Diagnosis not present

## 2015-01-30 DIAGNOSIS — F32A Depression, unspecified: Secondary | ICD-10-CM

## 2015-01-30 DIAGNOSIS — G20 Parkinson's disease: Secondary | ICD-10-CM

## 2015-01-30 DIAGNOSIS — G4700 Insomnia, unspecified: Secondary | ICD-10-CM

## 2015-01-30 DIAGNOSIS — J309 Allergic rhinitis, unspecified: Secondary | ICD-10-CM

## 2015-01-31 ENCOUNTER — Telehealth: Payer: Self-pay | Admitting: Neurology

## 2015-01-31 NOTE — Telephone Encounter (Signed)
I am so very sorry for her loss. 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 He battled Parkinson's disease for many decades. I would like to contact his wife. C. Baruc Tugwell

## 2015-01-31 NOTE — Telephone Encounter (Signed)
Hospice Nurse, Butch Penny, called to inform [name] passed yesterday, 11/22 of a bowel obstruction.  Her cell number is [phone] if you have any questions.  She just wanted to make sure Dr. Brett Fairy was aware.

## 2015-02-01 ENCOUNTER — Encounter: Payer: Self-pay | Admitting: Adult Health

## 2015-02-01 NOTE — Progress Notes (Signed)
Patient ID: [name], male   DOB: 1929-04-23, 81 y.o.   MRN: [id number]   01/23/2015   Facility:  Nursing Home Location:  [place] Room Number: [id number] LEVEL OF CARE:  SNF (31)   Chief Complaint  Patient presents with  . Medical Management of Chronic Issues    Abdominal distention, CAD, Parkinson's disease, PAF, depression, allergic rhinitis, anxiety, insomnia, history of GI bleed, constipation and glaucoma    HISTORY OF PRESENT ILLNESS:  This is an 81 year old male who is being seen for a routine visit. He is a long-term care resident at [facility]. He has medical history of atrial fibrillation but not on anticoagulant due to history of GI bleed, Parkinson's disease, orthostatic hypotension and depression. He has been noted to have distended abdomen. Hypoactive bowel sounds noted on all 4 quadrants. Wife and hospice nurse @ bedside. It was reported that he had a small amount of bowel movement yesterday. Dulcolax suppository was inserted rectally by charge nurse and reported not noting any stools. Patient is awake/alert but non-verbal.   PAST MEDICAL HISTORY:  Past Medical History  Diagnosis Date  . HYPERLIPIDEMIA-MIXED 02/10/2008  . PARKINSON'S DISEASE 02/10/2008  . HYPERTENSION, BENIGN 02/09/2009  . GASTROINTESTINAL HEMORRHAGE, HX OF 02/10/2008  . ANEMIA 04/29/2010  . FIBRILLATION, ATRIAL 04/29/2010  . VENTRAL HERNIA 04/29/2010  . DEGENERATIVE JOINT DISEASE 04/29/2010  . Gastric ulcer   . Positive PPD   . Internal hemorrhoids   . Hyperplastic colon polyp 2004  . CAD (coronary artery disease)     23.0 x 24 mm Taxus DES mid LAD 2005, [place] diagonal branch 2005  . VENOUS INSUFFICIENCY 02/10/2008    rt  . Shortness of breath   . Osteomyelitis ([place])   . Calculus of kidney   . Tuberculosis     81 yrs old  . GERD (gastroesophageal reflux disease)     ulcers hx  . Infection and inflammatory reaction due to device, implant, and graft ([place])   .  Neuromuscular disorder ([place])   . Sundowning     past lumbar decompressive surgery  . Chronic kidney disease (CKD), stage III (moderate)   . Blood transfusion without reported diagnosis   . Dyslipidemia   . Parkinson disease ([place])   . GI bleed   . Coronary artery disease   . PPD positive     internal hemorrhoids  . Depression   . Osteoporosis   . 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  Always use your most recent med list.               acetaminophen 325 MG tablet  Commonly known as:  TYLENOL  Take 325 mg by mouth every 6 (six) hours as needed.     albuterol (2.5 MG/3ML) 0.083% nebulizer solution  Commonly known as:  PROVENTIL  Take 3 mLs by nebulization every 6 (six) hours as needed for wheezing or shortness of breath.     amantadine 100 MG capsule  Commonly known as:  SYMMETREL  Take one tab in AM and one tab  at lunch time.     aspirin 81 MG tablet  Take 81 mg by mouth daily.     buPROPion 100 MG tablet  Commonly known as:  WELLBUTRIN  Take 100 mg by mouth daily.  carbidopa-levodopa 25-250 MG tablet  Commonly known as:  SINEMET  Take 1 tablet by mouth 5 (five) times daily. The first dose in a.m. with orange juice,  continue giving medication every 4-5 hours after initial morning doors. Suggested are 8 AM, 12 noon, 4 PM, 8 PM and a late nighttime dose.     HYDROcodone-acetaminophen 5-325 MG tablet  Commonly known as:  NORCO/VICODIN  Take one tablet by mouth three times daily for pain. Do not exceed 4gm of Tylenol in 24 hours     lactulose (encephalopathy) 10 GM/15ML Soln  Commonly known as:  CHRONULAC  Take 20 g by mouth at bedtime.     latanoprost 0.005 % ophthalmic solution  Commonly known as:  XALATAN  Place 1 drop into the left eye 2 (two) times daily.     loratadine 10 MG tablet  Commonly known as:  CLARITIN  Take  10 mg by mouth daily.     LORazepam 0.5 MG tablet  Commonly known as:  ATIVAN  Take 1/2 tablet by mouth twice daily for anxiety     midodrine 5 MG tablet  Commonly known as:  PROAMATINE  Take 5 mg by mouth 2 (two) times daily as needed.     omeprazole 20 MG capsule  Commonly known as:  PRILOSEC  Take 1 capsule by mouth daily.     PARoxetine 30 MG tablet  Commonly known as:  PAXIL  Take 30 mg by mouth daily.     Pimavanserin Tartrate 17 MG Tabs  Commonly known as:  NUPLAZID  Take 17 mg by mouth 1 day or 1 dose.     polyethylene glycol packet  Commonly known as:  MIRALAX / GLYCOLAX  Take 17 g by mouth 2 (two) times daily. In 6- 8 ounces of liquid twice daily     pramipexole 0.5 MG tablet  Commonly known as:  MIRAPEX  Take 1 tablet (0.5 mg total) by mouth 3 (three) times daily.     pyridOXINE 50 MG tablet  Commonly known as:  B-6  Take 50 mg by mouth daily.     QUEtiapine 25 MG tablet  Commonly known as:  SEROQUEL  Take 12.5 mg by mouth at bedtime.     SENOKOT S PO  Take by mouth. Take 2 tablets by mouth twice daily for constipation     zolpidem 5 MG tablet  Commonly known as:  AMBIEN  Take one tablet by mouth every night at bedtime for rest         Allergies  Allergen Reactions  . Morphine Other (See Comments)    unknown  . Nitroglycerin Other (See Comments)    unknown  . Risperidone And Related Other (See Comments)    unknown  . Tuberculin Tests Other (See Comments)    unknown  . Vancomycin Rash     REVIEW OF SYSTEMS:  Unable to obtain due to patient being a poor historian  PHYSICAL EXAMINATION  GENERAL: slightly  distressed NECK: supple, trachea midline, no neck masses, no thyroid tenderness, no thyromegaly LYMPHATICS: no LAN in the neck, no supraclavicular LAN RESPIRATORY: breathing is slightly labored, BS CTAB CARDIAC: RRR, no murmur,no extra heart sounds, no edema GI: abdomen is distended, hypoactivel BS, no masses EXTREMITIES:   Able to  move BUE and has generalized weakness of BLE NEURO: + tremors and rigidity PSYCHIATRIC: the patient is alert & oriented to person, affect & behavior appropriate  LABS/RADIOLOGY: Labs reviewed:  07/10/14  WBC 6.8 hemoglobin 12.1  hematocrit 37.7 MCV 94.0 sodium 139 potassium 4.3 glucose 73 BUN 20 creatinine 1.46 total bilirubin 0.4 alkaline phosphatase 101 SGOT 22 SGPT <8 total protein 7.3 out in 3.8 calcium 9.0 03/23/14  chest x-ray shows negative pneumonia 03/02/14  WBC 8.3 hemoglobin 11.8 hematocrit 37.6 MCV and 98.7 sodium 138 potassium 4.2 glucose 91 BUN 26 creatinine 1.4 calcium 8.7 GFR 50.68 12/17/14  total protein 7.4 albumin 4.0 ALP 91 AST 20 ALT 7 12/09/13  WBC 8.9 hemoglobin 12.1 hematocrit 39.4 MCV 97.8 sodium 139 potassium 4.4 glucose 66 BUN 23 creatinine 1.4 calcium 9.4 GFR 53.30 total protein 7.5 albumin 4.0  ALP 96 AST 20 ALT 8 4-15 liver profile normal, CMP normal, hemoglobin 12.3, MCV 96.6 otherwise CBC normal 6/14 hemoglobin 12.8, MCV 91.3 otherwise CBC normal, glucose 102, BUN 24, creatinine 1.3 otherwise CMP normal 3/14 Hb 12.6, mcv 87.9 ow cbc nl 12/13 bun 25, cr 1.42 ow cmp nl   ASSESSMENT/PLAN:  CAD - continue aspirin 81 mg by mouth daily  Parkinson's disease - continue Amantadine 100 mg 1 capsule PO BID, Mirapex 0.5 mg by mouth 3 times a day and Sinemet 25-250 mg 1 tab by mouth 5X/day; currently followed by hospice, comfort care  Atrial fibrillation - rate controlled; not on any anticoagulant due to history of GI bleed; continue aspirin 81 mg by mouth daily   Depression - mood is stable; continue Paxil 30 mg by mouth daily and  Wellbutrin 100 mg by mouth daily   Allergic rhinitis - stable; continue Claritin 10 mg by mouth daily  Constipation - continue senna S 2 tabs by mouth twice a day, MiraLAX 17 g +4-6 ounces liquid by mouth twice a day and lactulose 20 g/30 mL by mouth daily at bedtime  Anxiety - mood is stable; continue Ativan 0.5 mg 1/2 tab = 0.25 mg by  mouth twice a day and daily   Insomnia - continue Ambien 5 mg 1 tab by mouth daily at bedtime  History of GI bleed - continue Prilosec 20 mg by mouth daily  Orthostatic hypotension - stable; continue midodrine 5 mg by mouth twice a day   Glaucoma of left eye - continue Xalatan 0.005% instill 1 gtt to left eye Q HS  Abdominal distention  - KUB stat; Dulcolax suppository 10 mg 1 rectally now; wife would like to make decision pending result of KUB     Goals of care:   long-term care/comfort care/hospice    [facility], NP [employer] [phone]

## 2015-02-07 ENCOUNTER — Ambulatory Visit: Payer: PRIVATE HEALTH INSURANCE | Admitting: Neurology

## 2015-02-08 DEATH — deceased
